# Patient Record
Sex: Male | Born: 1940
Health system: Southern US, Community
[De-identification: ages and names within clinical notes are randomized; demographics above are authoritative.]

## PROBLEM LIST (undated history)

## (undated) DIAGNOSIS — I2510 Atherosclerotic heart disease of native coronary artery without angina pectoris: Secondary | ICD-10-CM

## (undated) DIAGNOSIS — M8580 Other specified disorders of bone density and structure, unspecified site: Secondary | ICD-10-CM

## (undated) DIAGNOSIS — N183 Chronic kidney disease, stage 3 unspecified: Secondary | ICD-10-CM

## (undated) DIAGNOSIS — I10 Essential (primary) hypertension: Secondary | ICD-10-CM

## (undated) DIAGNOSIS — D649 Anemia, unspecified: Secondary | ICD-10-CM

## (undated) DIAGNOSIS — E785 Hyperlipidemia, unspecified: Secondary | ICD-10-CM

## (undated) DIAGNOSIS — N200 Calculus of kidney: Secondary | ICD-10-CM

## (undated) DIAGNOSIS — I493 Ventricular premature depolarization: Secondary | ICD-10-CM

## (undated) DIAGNOSIS — K219 Gastro-esophageal reflux disease without esophagitis: Secondary | ICD-10-CM

## (undated) DIAGNOSIS — H269 Unspecified cataract: Secondary | ICD-10-CM

## (undated) DIAGNOSIS — I219 Acute myocardial infarction, unspecified: Secondary | ICD-10-CM

## (undated) DIAGNOSIS — I491 Atrial premature depolarization: Secondary | ICD-10-CM

## (undated) DIAGNOSIS — E039 Hypothyroidism, unspecified: Secondary | ICD-10-CM

## (undated) DIAGNOSIS — W5911XA Bitten by nonvenomous snake, initial encounter: Secondary | ICD-10-CM

## (undated) HISTORY — DX: Atherosclerotic heart disease of native coronary artery without angina pectoris: I25.10

## (undated) HISTORY — PX: ORIF TIBIA & FIBULA FRACTURES: SHX2131

## (undated) HISTORY — DX: Unspecified cataract: H26.9

## (undated) HISTORY — DX: Atrial premature depolarization: I49.1

## (undated) HISTORY — PX: VASECTOMY: SHX75

## (undated) HISTORY — DX: Ventricular premature depolarization: I49.3

## (undated) HISTORY — DX: Hyperlipidemia, unspecified: E78.5

## (undated) HISTORY — DX: Essential (primary) hypertension: I10

## (undated) HISTORY — DX: Bitten by nonvenomous snake, initial encounter: W59.11XA

## (undated) HISTORY — DX: Gastro-esophageal reflux disease without esophagitis: K21.9

## (undated) HISTORY — DX: Acute myocardial infarction, unspecified: I21.9

## (undated) HISTORY — DX: Anemia, unspecified: D64.9

## (undated) HISTORY — PX: PTCA: SHX146

## (undated) HISTORY — DX: Chronic kidney disease, stage 3 unspecified: N18.30

## (undated) HISTORY — DX: Other specified disorders of bone density and structure, unspecified site: M85.80

## (undated) HISTORY — DX: Calculus of kidney: N20.0

## (undated) HISTORY — PX: INGUINAL HERNIA REPAIR: SHX194

---

## 1989-06-09 DIAGNOSIS — I2510 Atherosclerotic heart disease of native coronary artery without angina pectoris: Secondary | ICD-10-CM

## 1989-06-09 HISTORY — PX: ANGIOPLASTY: SHX39

## 1989-06-09 HISTORY — DX: Atherosclerotic heart disease of native coronary artery without angina pectoris: I25.10

## 2000-01-09 ENCOUNTER — Emergency Department (HOSPITAL_COMMUNITY): Admission: EM | Admit: 2000-01-09 | Discharge: 2000-01-10 | Payer: Self-pay

## 2000-06-06 ENCOUNTER — Emergency Department (HOSPITAL_COMMUNITY): Admission: EM | Admit: 2000-06-06 | Discharge: 2000-06-06 | Payer: Self-pay | Admitting: Emergency Medicine

## 2000-06-16 ENCOUNTER — Encounter: Payer: Self-pay | Admitting: Vascular Surgery

## 2000-06-16 ENCOUNTER — Ambulatory Visit (HOSPITAL_COMMUNITY): Admission: RE | Admit: 2000-06-16 | Discharge: 2000-06-16 | Payer: Self-pay | Admitting: Vascular Surgery

## 2002-05-29 ENCOUNTER — Emergency Department (HOSPITAL_COMMUNITY): Admission: EM | Admit: 2002-05-29 | Discharge: 2002-05-29 | Payer: Self-pay | Admitting: Emergency Medicine

## 2003-04-06 ENCOUNTER — Inpatient Hospital Stay (HOSPITAL_COMMUNITY): Admission: EM | Admit: 2003-04-06 | Discharge: 2003-04-08 | Payer: Self-pay | Admitting: Emergency Medicine

## 2003-06-10 HISTORY — PX: CARDIAC CATHETERIZATION: SHX172

## 2004-04-11 ENCOUNTER — Ambulatory Visit: Payer: Self-pay | Admitting: Internal Medicine

## 2004-04-23 ENCOUNTER — Ambulatory Visit: Payer: Self-pay | Admitting: Internal Medicine

## 2004-04-25 ENCOUNTER — Ambulatory Visit: Payer: Self-pay | Admitting: Internal Medicine

## 2004-05-08 ENCOUNTER — Ambulatory Visit: Payer: Self-pay | Admitting: Internal Medicine

## 2004-05-22 ENCOUNTER — Ambulatory Visit: Payer: Self-pay | Admitting: Cardiology

## 2004-05-22 ENCOUNTER — Ambulatory Visit: Payer: Self-pay

## 2004-05-24 ENCOUNTER — Ambulatory Visit: Payer: Self-pay | Admitting: Cardiology

## 2004-05-27 ENCOUNTER — Inpatient Hospital Stay (HOSPITAL_BASED_OUTPATIENT_CLINIC_OR_DEPARTMENT_OTHER): Admission: RE | Admit: 2004-05-27 | Discharge: 2004-05-27 | Payer: Self-pay | Admitting: Cardiology

## 2004-06-14 ENCOUNTER — Ambulatory Visit: Payer: Self-pay | Admitting: Cardiology

## 2004-06-17 ENCOUNTER — Ambulatory Visit: Payer: Self-pay | Admitting: Cardiology

## 2004-06-26 ENCOUNTER — Ambulatory Visit: Payer: Self-pay | Admitting: Internal Medicine

## 2004-07-03 ENCOUNTER — Ambulatory Visit: Payer: Self-pay | Admitting: Internal Medicine

## 2004-07-18 ENCOUNTER — Ambulatory Visit: Payer: Self-pay | Admitting: Cardiology

## 2004-09-02 ENCOUNTER — Ambulatory Visit: Payer: Self-pay | Admitting: Internal Medicine

## 2004-09-18 ENCOUNTER — Ambulatory Visit: Payer: Self-pay | Admitting: Internal Medicine

## 2005-01-02 ENCOUNTER — Ambulatory Visit: Payer: Self-pay | Admitting: Internal Medicine

## 2005-02-04 ENCOUNTER — Ambulatory Visit: Payer: Self-pay | Admitting: Internal Medicine

## 2005-04-16 ENCOUNTER — Ambulatory Visit: Payer: Self-pay | Admitting: Internal Medicine

## 2005-04-24 ENCOUNTER — Ambulatory Visit: Payer: Self-pay | Admitting: Internal Medicine

## 2005-05-21 ENCOUNTER — Ambulatory Visit: Payer: Self-pay | Admitting: Internal Medicine

## 2005-08-25 ENCOUNTER — Ambulatory Visit: Payer: Self-pay | Admitting: Internal Medicine

## 2005-11-18 ENCOUNTER — Ambulatory Visit: Payer: Self-pay | Admitting: Internal Medicine

## 2005-12-15 ENCOUNTER — Ambulatory Visit: Payer: Self-pay | Admitting: Internal Medicine

## 2005-12-22 ENCOUNTER — Ambulatory Visit: Payer: Self-pay | Admitting: Internal Medicine

## 2006-03-16 ENCOUNTER — Ambulatory Visit: Payer: Self-pay | Admitting: Internal Medicine

## 2006-04-02 ENCOUNTER — Ambulatory Visit: Payer: Self-pay | Admitting: Internal Medicine

## 2006-04-15 ENCOUNTER — Ambulatory Visit: Payer: Self-pay | Admitting: Internal Medicine

## 2006-05-04 ENCOUNTER — Ambulatory Visit: Payer: Self-pay | Admitting: Internal Medicine

## 2006-05-04 DIAGNOSIS — I2510 Atherosclerotic heart disease of native coronary artery without angina pectoris: Secondary | ICD-10-CM | POA: Insufficient documentation

## 2006-05-04 DIAGNOSIS — M899 Disorder of bone, unspecified: Secondary | ICD-10-CM | POA: Insufficient documentation

## 2006-05-04 DIAGNOSIS — J45909 Unspecified asthma, uncomplicated: Secondary | ICD-10-CM | POA: Insufficient documentation

## 2006-05-04 DIAGNOSIS — K219 Gastro-esophageal reflux disease without esophagitis: Secondary | ICD-10-CM | POA: Insufficient documentation

## 2006-05-04 DIAGNOSIS — R972 Elevated prostate specific antigen [PSA]: Secondary | ICD-10-CM | POA: Insufficient documentation

## 2006-05-04 DIAGNOSIS — E785 Hyperlipidemia, unspecified: Secondary | ICD-10-CM | POA: Insufficient documentation

## 2006-05-04 DIAGNOSIS — M949 Disorder of cartilage, unspecified: Secondary | ICD-10-CM

## 2006-05-04 DIAGNOSIS — I252 Old myocardial infarction: Secondary | ICD-10-CM | POA: Insufficient documentation

## 2006-05-04 DIAGNOSIS — Z87442 Personal history of urinary calculi: Secondary | ICD-10-CM | POA: Insufficient documentation

## 2006-06-08 ENCOUNTER — Ambulatory Visit: Payer: Self-pay | Admitting: Internal Medicine

## 2006-06-23 ENCOUNTER — Ambulatory Visit: Payer: Self-pay | Admitting: Internal Medicine

## 2006-08-11 ENCOUNTER — Ambulatory Visit: Payer: Self-pay | Admitting: Internal Medicine

## 2006-09-22 ENCOUNTER — Ambulatory Visit: Payer: Self-pay | Admitting: Internal Medicine

## 2006-09-22 LAB — CONVERTED CEMR LAB
ALT: 29 units/L (ref 0–40)
AST: 24 units/L (ref 0–37)
Albumin: 3.9 g/dL (ref 3.5–5.2)
Alkaline Phosphatase: 95 units/L (ref 39–117)
BUN: 12 mg/dL (ref 6–23)
Bilirubin, Direct: 0.1 mg/dL (ref 0.0–0.3)
CO2: 28 meq/L (ref 19–32)
Calcium: 9.7 mg/dL (ref 8.4–10.5)
Chloride: 105 meq/L (ref 96–112)
Cholesterol: 186 mg/dL (ref 0–200)
Creatinine, Ser: 0.9 mg/dL (ref 0.4–1.5)
GFR calc Af Amer: 109 mL/min
GFR calc non Af Amer: 90 mL/min
Glucose, Bld: 93 mg/dL (ref 70–99)
HDL: 41.8 mg/dL (ref >39.0)
LDL Cholesterol: 123 mg/dL — ABNORMAL HIGH (ref 0–99)
Potassium: 4.5 meq/L (ref 3.5–5.1)
Sodium: 141 meq/L (ref 135–145)
Total Bilirubin: 0.9 mg/dL (ref 0.3–1.2)
Total CHOL/HDL Ratio: 4.4
Total Protein: 7 g/dL (ref 6.0–8.3)
Triglycerides: 108 mg/dL (ref 0–149)
VLDL: 22 mg/dL (ref 0–40)

## 2006-12-07 ENCOUNTER — Ambulatory Visit: Payer: Self-pay | Admitting: Internal Medicine

## 2006-12-07 LAB — CONVERTED CEMR LAB
ALT: 40 units/L (ref 0–53)
AST: 29 units/L (ref 0–37)
Albumin: 3.8 g/dL (ref 3.5–5.2)
Alkaline Phosphatase: 104 units/L (ref 39–117)
Bilirubin, Direct: 0.2 mg/dL (ref 0.0–0.3)
Cholesterol: 172 mg/dL (ref 0–200)
HDL: 36 mg/dL — ABNORMAL LOW (ref >39.0)
LDL Cholesterol: 109 mg/dL — ABNORMAL HIGH (ref 0–99)
Total Bilirubin: 0.8 mg/dL (ref 0.3–1.2)
Total CHOL/HDL Ratio: 4.8
Total Protein: 6.9 g/dL (ref 6.0–8.3)
Triglycerides: 133 mg/dL (ref 0–149)
VLDL: 27 mg/dL (ref 0–40)

## 2007-02-05 ENCOUNTER — Encounter: Payer: Self-pay | Admitting: Internal Medicine

## 2007-04-08 ENCOUNTER — Ambulatory Visit: Payer: Self-pay | Admitting: Internal Medicine

## 2007-04-08 DIAGNOSIS — G4700 Insomnia, unspecified: Secondary | ICD-10-CM | POA: Insufficient documentation

## 2007-04-08 DIAGNOSIS — I10 Essential (primary) hypertension: Secondary | ICD-10-CM | POA: Insufficient documentation

## 2007-04-08 LAB — CONVERTED CEMR LAB
Cholesterol, target level: 200 mg/dL
HDL goal, serum: 40 mg/dL
LDL Goal: 100 mg/dL

## 2007-06-09 ENCOUNTER — Ambulatory Visit: Payer: Self-pay | Admitting: Family Medicine

## 2007-06-09 DIAGNOSIS — J45901 Unspecified asthma with (acute) exacerbation: Secondary | ICD-10-CM | POA: Insufficient documentation

## 2007-06-23 ENCOUNTER — Ambulatory Visit: Payer: Self-pay | Admitting: Internal Medicine

## 2007-06-24 ENCOUNTER — Ambulatory Visit: Payer: Self-pay | Admitting: Internal Medicine

## 2007-06-24 LAB — CONVERTED CEMR LAB
BUN: 21 mg/dL (ref 6–23)
Basophils Relative: 0.4 % (ref 0.0–1.0)
CO2: 26 meq/L (ref 19–32)
Calcium: 9.3 mg/dL (ref 8.4–10.5)
Chloride: 102 meq/L (ref 96–112)
Creatinine, Ser: 1 mg/dL (ref 0.4–1.5)
GFR calc Af Amer: 96 mL/min
Monocytes Relative: 7.9 % (ref 3.0–11.0)
Platelets: 260 10*3/uL (ref 150–400)
RBC: 3.9 M/uL — ABNORMAL LOW (ref 4.22–5.81)
RDW: 13 % (ref 11.5–14.6)
TSH: 2.94 microintl units/mL (ref 0.35–5.50)
WBC: 9.3 10*3/uL (ref 4.5–10.5)

## 2007-08-13 ENCOUNTER — Encounter: Payer: Self-pay | Admitting: Internal Medicine

## 2008-03-15 ENCOUNTER — Ambulatory Visit: Payer: Self-pay | Admitting: Internal Medicine

## 2008-06-09 HISTORY — PX: EYE SURGERY: SHX253

## 2008-07-04 ENCOUNTER — Ambulatory Visit: Payer: Self-pay | Admitting: Internal Medicine

## 2008-07-04 DIAGNOSIS — R5381 Other malaise: Secondary | ICD-10-CM | POA: Insufficient documentation

## 2008-07-04 DIAGNOSIS — R5383 Other fatigue: Secondary | ICD-10-CM

## 2008-07-05 ENCOUNTER — Ambulatory Visit: Payer: Self-pay | Admitting: Internal Medicine

## 2008-07-06 LAB — CONVERTED CEMR LAB
AST: 29 units/L (ref 0–37)
Albumin: 4 g/dL (ref 3.5–5.2)
Alkaline Phosphatase: 91 units/L (ref 39–117)
BUN: 13 mg/dL (ref 6–23)
Basophils Relative: 0.5 % (ref 0.0–3.0)
Eosinophils Relative: 1.7 % (ref 0.0–5.0)
GFR calc Af Amer: 96 mL/min
Glucose, Bld: 108 mg/dL — ABNORMAL HIGH (ref 70–99)
HCT: 40.6 % (ref 39.0–52.0)
Hemoglobin: 14.1 g/dL (ref 13.0–17.0)
Monocytes Absolute: 0.4 10*3/uL (ref 0.1–1.0)
Monocytes Relative: 6.8 % (ref 3.0–12.0)
Platelets: 240 10*3/uL (ref 150–400)
Potassium: 4.3 meq/L (ref 3.5–5.1)
RBC: 4.03 M/uL — ABNORMAL LOW (ref 4.22–5.81)
Total Protein: 7.1 g/dL (ref 6.0–8.3)
WBC: 6.5 10*3/uL (ref 4.5–10.5)

## 2008-07-18 ENCOUNTER — Ambulatory Visit: Payer: Self-pay | Admitting: Internal Medicine

## 2008-08-01 ENCOUNTER — Encounter: Payer: Self-pay | Admitting: Internal Medicine

## 2008-08-02 ENCOUNTER — Ambulatory Visit: Payer: Self-pay | Admitting: Internal Medicine

## 2008-08-02 DIAGNOSIS — R05 Cough: Secondary | ICD-10-CM

## 2008-08-02 DIAGNOSIS — R059 Cough, unspecified: Secondary | ICD-10-CM | POA: Insufficient documentation

## 2008-08-22 ENCOUNTER — Ambulatory Visit: Payer: Self-pay | Admitting: Internal Medicine

## 2008-09-25 ENCOUNTER — Emergency Department (HOSPITAL_COMMUNITY): Admission: EM | Admit: 2008-09-25 | Discharge: 2008-09-25 | Payer: Self-pay | Admitting: Emergency Medicine

## 2010-06-09 HISTORY — PX: CHOLECYSTECTOMY: SHX55

## 2010-10-25 NOTE — Procedures (Signed)
[place]. [facility]  Patient:    Richard Ho, Richard Ho                        MRN: [id number] Adm. Date:  [id number] Attending:  Alyson Locket CC:         Valetta Mole. Swords, M.D. [facility]   Procedure Report  PREOPERATIVE DIAGNOSIS:  Atheroemboli to left second toe.  POSTOPERATIVE DIAGNOSIS:  Atheroemboli to left second toe.  PROCEDURE:  Aortogram with bilateral lower extremity runoff.  SURGEON:  Larina Earthly, M.D.  ANESTHESIA:  1% lidocaine local.  COMPLICATIONS:  None.  DISPOSITION:  To holding area stable.  DESCRIPTION OF PROCEDURE:  The patient was taken to the peripheral vascular catheterization lab, placed in the supine position, where the area of both right and left groins was prepped and draped in the usual sterile fashion. Using local anesthesia and a single wall puncture, the right common femoral artery was entered, and a guidewire was passed up to the level of the suprarenal aorta.  A 5 French sheath was then passed over the guidewire.  A pigtail catheter was passed from the midthoracic aorta, and thoracic arteriogram was obtained.  The catheter was then withdrawn down to the level of the renal arteries, and abdominal aortic arteriogram was obtained.  Next, runoff was obtained through the same pigtail catheter in both iliac segments, femoral and tibial vessels.  Finally, the left common iliac artery was selectively catheterized by exchanging the pigtail catheter with an IMA catheter.  Arteriogram of the left foot was obtained.  The findings were of absolutely normal aorta, iliac, femoral and tibial sections with no evidence of any atheromatous irregularity.  The patient had widely patent three-vessel tibial runoff bilaterally.  The foot films showed normal flow into the foot. The patient tolerated the procedure without immediate complication, was transferred to the holding area, where the sheath was removed. DD:  06/16/00 TD:  06/16/00 Job:  [id number]

## 2010-10-25 NOTE — Op Note (Signed)
NAME:  AKI, ABALOS NO.:  [id number]   MEDICAL RECORD NO.:  [id number]                   PATIENT TYPE:  INP   LOCATION:  5039                                 FACILITY:  [facility]   PHYSICIAN:  Lenard Galloway. Chaney Malling, M.D.           DATE OF BIRTH:  May 03, 1941   DATE OF PROCEDURE:  04/06/2003  DATE OF DISCHARGE:                                 OPERATIVE REPORT   PREOPERATIVE DIAGNOSIS:  Fracture of right proximal fibula and fracture of  right tibia at junction of mid and distal thirds.   POSTOPERATIVE DIAGNOSIS:  Fracture of right proximal fibula and fracture of  right tibia at junction of mid and distal thirds.   OPERATION:  Intramedullary rod fixation, right tibia.   SURGEON:  Lenard Galloway. Chaney Malling, M.D.   ASSISTANT:  Madilyn Fireman, P.A.-C.   ANESTHESIA:  General.   PROCEDURE:  The patient was placed on the operating table in a supine  position with a pneumatic tourniquet about the right upper thigh.  The right  leg was prepped with Duraprep and draped out in the usual manner.  The leg  was then wrapped out with an Esmarch and tourniquet was elevated.  An  incision was made down the midline from the midportion of the patella down  to the tibial tubercle.  A median parapatellar incision was then made and  there was good access to the posterior aspect of the patellar tendon and the  fat pad.  An awl was placed just posterior to the patellar tendon in the  midline and a hole was made in the proximal tibia.  A series of guides were  passed down to open up the tibia.  A beaded rod was then passed down across  the fracture site with the fracture reduced.  A series of reamers were then  used and the tibia was reamed out to an 11 diameter.  The exchange tube was  then passed over the beaded guidewire and the beaded wire was removed and a  final guidewire was inserted.  The exchange tube was then removed.  The  properly measured tibial nail was then selected and  an outrigger was placed  on the proximal end.  The nail was placed over the guide pin to passed down  the fracture site, down to the distal part of the tibia, and excellent,  almost anatomic reduction was achieved.  There was a good snug fit.  This  was checked throughout the procedure with the C-arm.  Proximally, two screws  were placed using the external guide and these were placed at about 60  degrees of angulation to each other.  Tube cortical locking was achieved.  Distally, excellent position of the IM nail was seen.  The proximal  outrigger was then removed and a cap was placed on the proximal end of the  tibial intramedullary rod.  Attention was then turned to the  distal end of  the rod.  Using the C-arm, drill holes were placed through the distal tibia  through the rod and appropriate-length screws were selected and inserted.  Excellent locking of the rod was achieved distally.  Wounds were irrigated.  The subcutaneous tissue was closed with 2-0 Vicryl, stainless steel staples  were used to close the skin, sterile dressings were applied and the patient  returned to the recovery room in excellent condition.  Technically, this  procedure went extremely well.    DRAINS:  None.   COMPLICATIONS:  None.                                               Rodney A. Chaney Malling, M.D.    RAM/MEDQ  D:  04/06/2003  T:  04/07/2003  Job:  [id number]

## 2010-10-25 NOTE — Cardiovascular Report (Signed)
NAME:  Richard Ho, BONZO NO.:  [id number]   MEDICAL RECORD NO.:  [id number]          PATIENT TYPE:  OIB   LOCATION:  6501                         FACILITY:  [facility]   PHYSICIAN:  Rollene Rotunda, M.D.   DATE OF BIRTH:  1941/04/18   DATE OF PROCEDURE:  05/27/2004  DATE OF DISCHARGE:  05/27/2004                              CARDIAC CATHETERIZATION   PRIMARY CARE PHYSICIAN:  Dr. Birdie Sons   CARDIOLOGIST:  Dr. Antoine Poche   PROCEDURES PERFORMED:  1.  Left heart catheterization.  2.  Coronary arteriography.   INDICATIONS:  Evaluate patient with Cardiolite suggesting inferior ischemia.  He previously had known coronary disease.  He has had a couple of  interventions in [place].  Apparently, he had one in 72 in [place].  He  has had intervention as far as I can tell on mid and distal right coronary  lesions on separate occasions.  His last catheterization in 1997  demonstrated recannulized right coronary that was managed medically.   DETAILS OF THE PROCEDURE:  Left heart catheterization was performed via the  right femoral artery.  The artery was cannulated using anterior wall  puncture.  A #4-French arterial sheath was inserted via the modified  Seldinger technique.  Preformed Judkins and a pigtail catheter were  utilized.  Patient tolerated the procedure well and left the laboratory in  stable condition.   RESULTS:  HEMODYNAMICS:  LV 137/8.  AO 143/84.   CORONARIES:  The left main was normal.   The LAD had proximal long 40% stenosis bridging a small first diagonal and a  large second diagonal.  The distal vessel appeared to wrap the apex, but was  small in caliber and seemed to have some diffuse disease.  The second  diagonal was a large vessel with an ostial 50-60% stenosis seen in the RAO  cranial and RAO views.   Circumflex had mid 25% stenosis in the AV groove.  The distal AV groove  vessel was very small.  There was a large first obtuse marginal which  had  long proximal and mid 70% stenosis.  A second obtuse marginal came off right  below the first obtuse marginal and was a long, but narrow vessel with  proximal and mid 70% stenosis.   The right coronary artery was a large dominant vessel.  There was a long  proximal 50% stenosis and diffuse luminal irregularities in the proximal  segment.  There was mid 99% stenosis as was noted in 1997.  There was a long  40-30% stenosis before the PDA and 40% stenosis after the PDA before two  posterolaterals.  PDA was narrow caliber vessel, but long.  Had a mid  diffuse 90-95% stenosis.  There were two small posterolaterals after which  were free of high grade disease.   LEFT VENTRICULOGRAM:  A left ventriculogram was obtained in the RAO  projection.  The EF was 65% with well preserved wall motion.   CONCLUSION:  High grade right coronary artery disease.  Moderate to severe  circumflex disease and nonobstructive left anterior descending disease.  I  have reviewed  the previous notes.  There may be some progression in the left  anterior descending comparing this catheterization to their description.  Seems to be progression in the distal right coronary artery as well.  However, the mid right coronary artery lesion was present in 1997.  The  circumflex had some 70% narrowing by their description, though this may have  progressed as well.   PLAN:  I reviewed this very extensively with the patient and his wife.  At  this point we can consider three options.  Bypass would be an option, though  I do not think the disease has progressed dramatically and he is not having  any obvious unstable symptoms.  His Cardiolite suggested no high risk  features in the LAD circumflex distribution.  Consideration could be given  to angioplasty and stent of the worst lesion in this situation, this is the  right coronary.  However, this has not changed since 1997 by description.  At this point I favor very aggressive  medical management.  Toward that end,  he needs aggressive lifestyle changes with diet and exercise.  He needs high  dose of Statins with very low lipids as a goal.  I would put him on aspirin  and Plavix.  I will increase his beta blocker.  We would reconsider this  strategy with any symptoms.  I probably would follow him with stress  perfusion imaging understanding that the inferior wall will always be  ischemic.   I will review this with other colleagues to get further opinions.       JH/MEDQ  D:  05/27/2004  T:  05/28/2004  Job:  [id number]   cc:   Valetta Mole. Swords, M.D. [facility]

## 2010-10-25 NOTE — Discharge Summary (Signed)
NAME:  Richard Ho, Richard Ho NO.:  [id number]   MEDICAL RECORD NO.:  [id number]                   PATIENT TYPE:  INP   LOCATION:  5039                                 FACILITY:  [facility]   PHYSICIAN:  Lenard Galloway. Chaney Malling, M.D.           DATE OF BIRTH:  [birth year]   DATE OF ADMISSION:  04/06/2003  DATE OF DISCHARGE:  04/08/2003                                 DISCHARGE SUMMARY   ADMISSION DIAGNOSES:  1. Right open tibial-fibula fracture.  2. History of asthma.  3. History of myocardial infarction.  4. Hypertension.  5. Irritable bowel syndrome.  6. Hypercholesterolemia.  7. History of gastroesophageal reflux disease.  8. History of hiatal hernia.   DISCHARGE DIAGNOSES:  1. Intramedullary nailing of right tibia fracture.  2. History of asthma.  3. History of myocardial infarction.  4. Hypertension.  5. History of irritable bowel syndrome.  6. History of hypercholesterolemia.  7. History of gastroesophageal reflux disease.  8. History of hiatal hernia.   HISTORY OF PRESENT ILLNESS:  The patient is a 70 year old male who fell  earlier the date of admission at [employer] on a slick floor.  The  patient twisted his lower extremity, felt a pop in his leg.  The leg was  angulated upon EMS arrival.  The patient was brought to [facility] emergency  room for evaluation, found right tib-fib fracture which was open.   SURGICAL PROCEDURE:  On April 06, 2003 the patient was taken to the OR for  intramedullary rod fixation of his right tibia.  The surgical procedure went  well.  There were no complications and the patient was transferred to the  recovery room and then to the orthopedic floor in good condition.   CONSULTS:  The following routine consults were requested:  Physical therapy,  case management.   HOSPITAL COURSE:  On April 06, 2003 the patient was admitted to [facility] under the care of Dr. Rinaldo Ratel.  The patient was taken to  the OR where an intramedullary nailing of his right tibia fracture was  performed.  The patient tolerated the procedure well, there were no  complications, and the patient was transferred to the recovery room and then  to the orthopedic floor for routine postoperative care.   The patient then incurred a total of two days postoperative care on the  orthopedic floor in which the patient had no significant medical issues  incurred.  The patient's leg remained neuromotor-vascularly intact.  He was  placed in a __________ posterior splint for support.  He was placed on  Lovenox for routine DVT prophylaxis and he did receive physical therapy for  instructions with crutch training.   On postoperative day #2 the patient's pain was well controlled.  He had  worked well with physical therapy.  His wound was benign so he was  discharged home for recovery and home health physical therapy  in good  condition.   LABORATORY DATA:  EKG on admission was normal sinus rhythm at 79 beats per  minute.   Routine chemistries on admission:  Sodium 138, potassium 4.5, glucose 106,  BUN 11, creatinine 1.0.  Hematocrit 42 with a hemoglobin of 14.   MEDICATIONS UPON DISCHARGE FROM ORTHOPEDICS FLOOR:  1. Atenolol 25 mg p.o. daily.  2. Zocor 20 mg p.o. daily.  3. Protonix 40 mg p.o. daily.  4. Desyrel 25 mg p.o. p.r.n.  5. Librax one tablet p.o. p.r.n.  6. Lovenox 30 mg subcu q.12h.  7. Percocet one or two tablets q.4-6h. p.r.n. pain.   DISCHARGE INSTRUCTIONS:  1. Medications:  The patient is to resume routine home medications.  2. Percocet 5 mg one or two tablets q.4-6h. for pain if needed.  3. Lovenox 40 mg injection once a day for the next 11 days.  4. Activity:  The patient must rest as much as possible with the right foot     elevated.  The patient is only to have touchdown weight of right foot for     balance only with ambulating with use of crutches.  5. Diet:  No restrictions.  6. Wound care:  The  patient is to keep right foot cast clean and dry.   SPECIAL INSTRUCTIONS:  1. [facility] for physical therapy.  2. Follow-up:  The patient should call for a follow-up appointment with Dr.     Chaney Malling in one week.   CONDITION ON DISCHARGE:  The patient's condition upon discharge to home is  listed as improved and good.      Jamelle Rushing, P.A.                      Rodney A. Chaney Malling, M.D.    RWK/MEDQ  D:  05/03/2003  T:  05/03/2003  Job:  [id number]

## 2011-02-03 ENCOUNTER — Telehealth: Payer: Self-pay | Admitting: Internal Medicine

## 2011-02-03 NOTE — Telephone Encounter (Signed)
Pt called back today and is checking on the status of his Disability form. Thanks.

## 2011-02-06 ENCOUNTER — Telehealth: Payer: Self-pay | Admitting: *Deleted

## 2011-02-06 NOTE — Telephone Encounter (Signed)
Pt in limbo, needs to know if disability forms are going to be filled out.  Please advise any way.  Cindy Please notify pt.

## 2011-02-07 NOTE — Telephone Encounter (Signed)
Pt was informed that Dr Cato Mulligan would not fill out paperwork cause he has not been seen in 2 years.  Pt has an appt with Dr Cato Mulligan in October and he will bring paperwork with him on the day of appt.

## 2011-02-18 ENCOUNTER — Telehealth: Payer: Self-pay | Admitting: Internal Medicine

## 2011-02-18 NOTE — Telephone Encounter (Signed)
Walk in------had labs done at his Gi specialist and his Tsh results were 6.08. Pt was told by his specialist that it was high and that he would need to consult with Dr Cato Mulligan. Patient has an appt next month and would like to know if this could wait until then or does he need to be advised now of what to do? Arline Asp has the copy of his lab work. Thanks.

## 2011-02-18 NOTE — Telephone Encounter (Signed)
It can probably wait--i have not seen patient in years

## 2011-02-18 NOTE — Telephone Encounter (Signed)
Pt was told this when he was in the office.

## 2011-02-22 ENCOUNTER — Emergency Department (HOSPITAL_COMMUNITY): Payer: Medicare Other

## 2011-02-22 ENCOUNTER — Inpatient Hospital Stay (HOSPITAL_COMMUNITY)
Admission: EM | Admit: 2011-02-22 | Discharge: 2011-02-28 | DRG: 417 | Disposition: A | Discharge status: Home / Self Care | Payer: Medicare Other | Attending: Internal Medicine | Admitting: Internal Medicine

## 2011-02-22 DIAGNOSIS — R7401 Elevation of levels of liver transaminase levels: Secondary | ICD-10-CM

## 2011-02-22 DIAGNOSIS — K589 Irritable bowel syndrome without diarrhea: Secondary | ICD-10-CM | POA: Diagnosis present

## 2011-02-22 DIAGNOSIS — M1990 Unspecified osteoarthritis, unspecified site: Secondary | ICD-10-CM | POA: Diagnosis present

## 2011-02-22 DIAGNOSIS — Z79899 Other long term (current) drug therapy: Secondary | ICD-10-CM

## 2011-02-22 DIAGNOSIS — I252 Old myocardial infarction: Secondary | ICD-10-CM

## 2011-02-22 DIAGNOSIS — D72829 Elevated white blood cell count, unspecified: Secondary | ICD-10-CM | POA: Diagnosis present

## 2011-02-22 DIAGNOSIS — I10 Essential (primary) hypertension: Secondary | ICD-10-CM | POA: Diagnosis present

## 2011-02-22 DIAGNOSIS — K859 Acute pancreatitis without necrosis or infection, unspecified: Secondary | ICD-10-CM

## 2011-02-22 DIAGNOSIS — M899 Disorder of bone, unspecified: Secondary | ICD-10-CM | POA: Diagnosis present

## 2011-02-22 DIAGNOSIS — I2510 Atherosclerotic heart disease of native coronary artery without angina pectoris: Secondary | ICD-10-CM | POA: Diagnosis present

## 2011-02-22 DIAGNOSIS — K8020 Calculus of gallbladder without cholecystitis without obstruction: Secondary | ICD-10-CM

## 2011-02-22 DIAGNOSIS — K5900 Constipation, unspecified: Secondary | ICD-10-CM | POA: Diagnosis not present

## 2011-02-22 DIAGNOSIS — K8042 Calculus of bile duct with acute cholecystitis without obstruction: Secondary | ICD-10-CM | POA: Diagnosis present

## 2011-02-22 DIAGNOSIS — R1011 Right upper quadrant pain: Secondary | ICD-10-CM

## 2011-02-22 DIAGNOSIS — E119 Type 2 diabetes mellitus without complications: Secondary | ICD-10-CM | POA: Diagnosis present

## 2011-02-22 DIAGNOSIS — K5730 Diverticulosis of large intestine without perforation or abscess without bleeding: Secondary | ICD-10-CM | POA: Diagnosis present

## 2011-02-22 DIAGNOSIS — R740 Nonspecific elevation of levels of transaminase and lactic acid dehydrogenase [LDH]: Secondary | ICD-10-CM

## 2011-02-22 DIAGNOSIS — K219 Gastro-esophageal reflux disease without esophagitis: Secondary | ICD-10-CM | POA: Diagnosis present

## 2011-02-22 DIAGNOSIS — J45909 Unspecified asthma, uncomplicated: Secondary | ICD-10-CM | POA: Diagnosis present

## 2011-02-22 DIAGNOSIS — Z9861 Coronary angioplasty status: Secondary | ICD-10-CM

## 2011-02-22 DIAGNOSIS — E860 Dehydration: Secondary | ICD-10-CM | POA: Diagnosis present

## 2011-02-22 DIAGNOSIS — R109 Unspecified abdominal pain: Secondary | ICD-10-CM

## 2011-02-22 DIAGNOSIS — Z7982 Long term (current) use of aspirin: Secondary | ICD-10-CM

## 2011-02-22 DIAGNOSIS — Z9849 Cataract extraction status, unspecified eye: Secondary | ICD-10-CM

## 2011-02-22 DIAGNOSIS — J690 Pneumonitis due to inhalation of food and vomit: Secondary | ICD-10-CM | POA: Diagnosis present

## 2011-02-22 LAB — POCT I-STAT, CHEM 8
Calcium, Ion: 1.23 mmol/L (ref 1.12–1.32)
Glucose, Bld: 150 mg/dL — ABNORMAL HIGH (ref 70–99)
HCT: 41 % (ref 39.0–52.0)
Hemoglobin: 13.9 g/dL (ref 13.0–17.0)
Potassium: 3.9 mEq/L (ref 3.5–5.1)

## 2011-02-22 LAB — LACTIC ACID, PLASMA: Lactic Acid, Venous: 2 mmol/L (ref 0.5–2.2)

## 2011-02-22 LAB — TSH: TSH: 2.433 u[IU]/mL (ref 0.350–4.500)

## 2011-02-22 LAB — DIFFERENTIAL
Basophils Absolute: 0 10*3/uL (ref 0.0–0.1)
Basophils Relative: 0 % (ref 0–1)
Eosinophils Absolute: 0.1 10*3/uL (ref 0.0–0.7)
Neutrophils Relative %: 87 % — ABNORMAL HIGH (ref 43–77)

## 2011-02-22 LAB — COMPREHENSIVE METABOLIC PANEL
AST: 175 U/L — ABNORMAL HIGH (ref 0–37)
Albumin: 3.7 g/dL (ref 3.5–5.2)
Alkaline Phosphatase: 124 U/L — ABNORMAL HIGH (ref 39–117)
Chloride: 103 mEq/L (ref 96–112)
Potassium: 3.8 mEq/L (ref 3.5–5.1)
Total Bilirubin: 1.6 mg/dL — ABNORMAL HIGH (ref 0.3–1.2)
Total Protein: 6.9 g/dL (ref 6.0–8.3)

## 2011-02-22 LAB — CK TOTAL AND CKMB (NOT AT ARMC)
CK, MB: 2.1 ng/mL (ref 0.3–4.0)
Relative Index: INVALID (ref 0.0–2.5)
Total CK: 66 U/L (ref 7–232)

## 2011-02-22 LAB — CBC
Platelets: 277 10*3/uL (ref 150–400)
RBC: 4.06 MIL/uL — ABNORMAL LOW (ref 4.22–5.81)
WBC: 13.1 10*3/uL — ABNORMAL HIGH (ref 4.0–10.5)

## 2011-02-22 LAB — GLUCOSE, CAPILLARY
Glucose-Capillary: 139 mg/dL — ABNORMAL HIGH (ref 70–99)
Glucose-Capillary: 139 mg/dL — ABNORMAL HIGH (ref 70–99)

## 2011-02-22 LAB — HEPATIC FUNCTION PANEL
ALT: 240 U/L — ABNORMAL HIGH (ref 0–53)
AST: 226 U/L — ABNORMAL HIGH (ref 0–37)
Albumin: 3.8 g/dL (ref 3.5–5.2)
Bilirubin, Direct: 2 mg/dL — ABNORMAL HIGH (ref 0.0–0.3)

## 2011-02-22 LAB — TROPONIN I: Troponin I: <0.3 ng/mL (ref <0.30)

## 2011-02-22 LAB — URINALYSIS, ROUTINE W REFLEX MICROSCOPIC
Glucose, UA: NEGATIVE mg/dL
Ketones, ur: NEGATIVE mg/dL
Leukocytes, UA: NEGATIVE
Specific Gravity, Urine: 1.022 (ref 1.005–1.030)
pH: 6.5 (ref 5.0–8.0)

## 2011-02-22 MED ORDER — IOHEXOL 300 MG/ML  SOLN
100.0000 mL | Freq: Once | INTRAMUSCULAR | Status: AC | PRN
  Administered 2011-02-22: 100 mL via INTRAVENOUS

## 2011-02-23 DIAGNOSIS — Z01810 Encounter for preprocedural cardiovascular examination: Secondary | ICD-10-CM

## 2011-02-23 DIAGNOSIS — R7401 Elevation of levels of liver transaminase levels: Secondary | ICD-10-CM

## 2011-02-23 DIAGNOSIS — R740 Nonspecific elevation of levels of transaminase and lactic acid dehydrogenase [LDH]: Secondary | ICD-10-CM

## 2011-02-23 DIAGNOSIS — K8020 Calculus of gallbladder without cholecystitis without obstruction: Secondary | ICD-10-CM

## 2011-02-23 DIAGNOSIS — I2510 Atherosclerotic heart disease of native coronary artery without angina pectoris: Secondary | ICD-10-CM

## 2011-02-23 DIAGNOSIS — R1011 Right upper quadrant pain: Secondary | ICD-10-CM

## 2011-02-23 DIAGNOSIS — K859 Acute pancreatitis without necrosis or infection, unspecified: Secondary | ICD-10-CM

## 2011-02-23 LAB — GLUCOSE, CAPILLARY: Glucose-Capillary: 96 mg/dL (ref 70–99)

## 2011-02-23 LAB — COMPREHENSIVE METABOLIC PANEL
Albumin: 3.3 g/dL — ABNORMAL LOW (ref 3.5–5.2)
Alkaline Phosphatase: 140 U/L — ABNORMAL HIGH (ref 39–117)
BUN: 14 mg/dL (ref 6–23)
CO2: 26 mEq/L (ref 19–32)
Chloride: 104 mEq/L (ref 96–112)
Creatinine, Ser: 1.08 mg/dL (ref 0.50–1.35)
GFR calc non Af Amer: >60 mL/min (ref >60)
Potassium: 3.9 mEq/L (ref 3.5–5.1)
Total Bilirubin: 4.5 mg/dL — ABNORMAL HIGH (ref 0.3–1.2)

## 2011-02-23 LAB — URINE CULTURE
Colony Count: NO GROWTH
Culture  Setup Time: 201209151729
Culture: NO GROWTH

## 2011-02-23 LAB — DIFFERENTIAL
Basophils Absolute: 0 10*3/uL (ref 0.0–0.1)
Lymphocytes Relative: 8 % — ABNORMAL LOW (ref 12–46)
Lymphs Abs: 0.8 10*3/uL (ref 0.7–4.0)
Neutro Abs: 9.1 10*3/uL — ABNORMAL HIGH (ref 1.7–7.7)
Neutrophils Relative %: 84 % — ABNORMAL HIGH (ref 43–77)

## 2011-02-23 LAB — CBC
HCT: 35.3 % — ABNORMAL LOW (ref 39.0–52.0)
Hemoglobin: 12.1 g/dL — ABNORMAL LOW (ref 13.0–17.0)
MCV: 95.1 fL (ref 78.0–100.0)
RBC: 3.71 MIL/uL — ABNORMAL LOW (ref 4.22–5.81)
RDW: 13.9 % (ref 11.5–15.5)
WBC: 10.9 10*3/uL — ABNORMAL HIGH (ref 4.0–10.5)

## 2011-02-23 LAB — AMYLASE: Amylase: 74 U/L (ref 0–105)

## 2011-02-23 LAB — LIPASE, BLOOD: Lipase: 40 U/L (ref 11–59)

## 2011-02-24 LAB — BASIC METABOLIC PANEL
CO2: 22 mEq/L (ref 19–32)
Chloride: 104 mEq/L (ref 96–112)
GFR calc Af Amer: >60 mL/min (ref >60)
Potassium: 3.5 mEq/L (ref 3.5–5.1)

## 2011-02-24 LAB — URINALYSIS, MICROSCOPIC ONLY
Glucose, UA: NEGATIVE mg/dL
Specific Gravity, Urine: 1.024 (ref 1.005–1.030)
Urobilinogen, UA: 1 mg/dL (ref 0.0–1.0)
pH: 6 (ref 5.0–8.0)

## 2011-02-24 LAB — HEPATIC FUNCTION PANEL
Albumin: 3.1 g/dL — ABNORMAL LOW (ref 3.5–5.2)
Alkaline Phosphatase: 166 U/L — ABNORMAL HIGH (ref 39–117)
Indirect Bilirubin: 1.1 mg/dL — ABNORMAL HIGH (ref 0.3–0.9)
Total Protein: 6.6 g/dL (ref 6.0–8.3)

## 2011-02-24 LAB — LIPID PANEL
Cholesterol: 149 mg/dL (ref 0–200)
Triglycerides: 190 mg/dL — ABNORMAL HIGH (ref <150)

## 2011-02-24 LAB — CBC
HCT: 35.1 % — ABNORMAL LOW (ref 39.0–52.0)
MCV: 95.6 fL (ref 78.0–100.0)
Platelets: 235 10*3/uL (ref 150–400)
RBC: 3.67 MIL/uL — ABNORMAL LOW (ref 4.22–5.81)
RDW: 14.2 % (ref 11.5–15.5)
WBC: 7.9 10*3/uL (ref 4.0–10.5)

## 2011-02-24 LAB — GLUCOSE, CAPILLARY
Glucose-Capillary: 108 mg/dL — ABNORMAL HIGH (ref 70–99)
Glucose-Capillary: 114 mg/dL — ABNORMAL HIGH (ref 70–99)

## 2011-02-24 LAB — AMYLASE: Amylase: 61 U/L (ref 0–105)

## 2011-02-24 LAB — LIPASE, BLOOD: Lipase: 27 U/L (ref 11–59)

## 2011-02-25 ENCOUNTER — Other Ambulatory Visit (INDEPENDENT_AMBULATORY_CARE_PROVIDER_SITE_OTHER): Payer: Self-pay | Admitting: General Surgery

## 2011-02-25 ENCOUNTER — Inpatient Hospital Stay (HOSPITAL_COMMUNITY): Payer: Medicare Other

## 2011-02-25 DIAGNOSIS — K8010 Calculus of gallbladder with chronic cholecystitis without obstruction: Secondary | ICD-10-CM

## 2011-02-25 LAB — HEPATIC FUNCTION PANEL
Bilirubin, Direct: 1 mg/dL — ABNORMAL HIGH (ref 0.0–0.3)
Indirect Bilirubin: 1 mg/dL — ABNORMAL HIGH (ref 0.3–0.9)

## 2011-02-25 LAB — CBC
MCH: 33 pg (ref 26.0–34.0)
MCHC: 34.4 g/dL (ref 30.0–36.0)
Platelets: 235 10*3/uL (ref 150–400)

## 2011-02-25 LAB — DIFFERENTIAL
Basophils Relative: 0 % (ref 0–1)
Eosinophils Absolute: 0.2 10*3/uL (ref 0.0–0.7)
Monocytes Absolute: 0.8 10*3/uL (ref 0.1–1.0)
Monocytes Relative: 14 % — ABNORMAL HIGH (ref 3–12)
Neutrophils Relative %: 63 % (ref 43–77)

## 2011-02-25 LAB — ABO/RH: ABO/RH(D): A POS

## 2011-02-25 LAB — GLUCOSE, CAPILLARY
Glucose-Capillary: 98 mg/dL (ref 70–99)
Glucose-Capillary: 106 mg/dL — ABNORMAL HIGH (ref 70–99)
Glucose-Capillary: 125 mg/dL — ABNORMAL HIGH (ref 70–99)
Glucose-Capillary: 94 mg/dL (ref 70–99)

## 2011-02-25 LAB — BASIC METABOLIC PANEL
Chloride: 106 mEq/L (ref 96–112)
GFR calc Af Amer: >60 mL/min (ref >60)
Potassium: 3.6 mEq/L (ref 3.5–5.1)
Sodium: 139 mEq/L (ref 135–145)

## 2011-02-25 LAB — URINE CULTURE
Colony Count: NO GROWTH
Culture: NO GROWTH

## 2011-02-26 LAB — BASIC METABOLIC PANEL
CO2: 27 mEq/L (ref 19–32)
Calcium: 9.3 mg/dL (ref 8.4–10.5)
Chloride: 102 mEq/L (ref 96–112)
Glucose, Bld: 103 mg/dL — ABNORMAL HIGH (ref 70–99)
Potassium: 3.7 mEq/L (ref 3.5–5.1)
Sodium: 138 mEq/L (ref 135–145)

## 2011-02-26 LAB — GLUCOSE, CAPILLARY
Glucose-Capillary: 106 mg/dL — ABNORMAL HIGH (ref 70–99)
Glucose-Capillary: 126 mg/dL — ABNORMAL HIGH (ref 70–99)

## 2011-02-26 LAB — DIFFERENTIAL
Basophils Absolute: 0 10*3/uL (ref 0.0–0.1)
Lymphocytes Relative: 16 % (ref 12–46)
Monocytes Absolute: 1 10*3/uL (ref 0.1–1.0)
Monocytes Relative: 13 % — ABNORMAL HIGH (ref 3–12)
Neutro Abs: 5.4 10*3/uL (ref 1.7–7.7)

## 2011-02-26 LAB — HEPATIC FUNCTION PANEL
Albumin: 3 g/dL — ABNORMAL LOW (ref 3.5–5.2)
Alkaline Phosphatase: 163 U/L — ABNORMAL HIGH (ref 39–117)
Total Protein: 6.6 g/dL (ref 6.0–8.3)

## 2011-02-26 LAB — CBC
HCT: 34 % — ABNORMAL LOW (ref 39.0–52.0)
Hemoglobin: 11.5 g/dL — ABNORMAL LOW (ref 13.0–17.0)
MCHC: 33.8 g/dL (ref 30.0–36.0)

## 2011-02-27 LAB — GLUCOSE, CAPILLARY
Glucose-Capillary: 107 mg/dL — ABNORMAL HIGH (ref 70–99)
Glucose-Capillary: 120 mg/dL — ABNORMAL HIGH (ref 70–99)

## 2011-02-28 LAB — CROSSMATCH
ABO/RH(D): A POS
Antibody Screen: NEGATIVE
Unit division: 0

## 2011-02-28 LAB — GLUCOSE, CAPILLARY: Glucose-Capillary: 100 mg/dL — ABNORMAL HIGH (ref 70–99)

## 2011-02-28 NOTE — Consult Note (Signed)
  NAMEJERRETT, BALDINGER NO.:  [id number]  MEDICAL RECORD NO.:  [id number]  LOCATION:  4737                         FACILITY:  [facility]  PHYSICIAN:  Ollen Gross. Vernell Morgans, M.D. DATE OF BIRTH:  1940/12/02  DATE OF CONSULTATION:  02/22/2011 DATE OF DISCHARGE:                                CONSULTATION   We were asked to see Mr. Marchitto in consultation by Dr. Janee Morn to evaluate him for gallstones.  CHIEF COMPLAINT:  Abdominal pain.  Mr. Goerner is a 70 year old white male who presents acutely to the emergency department with epigastric pain that started 3 a.m. this morning.  Pain was not associated with nausea or vomiting, but he did have shortness of breath with it.  It was pretty severe, so he came to the emergency department.  Since coming to the emergency department, got some medicine, he has had some relief.  PAST MEDICAL HISTORY:  Significant for coronary artery disease, MI, diabetes, hypertension, gastroesophageal reflux, asthma.  PAST SURGICAL HISTORY:  Bilateral inguinal hernia repairs, rhinoplasty, and right cataract surgery.  MEDICATIONS:  Include atenolol, metformin, simvastatin.  ALLERGIES:  To MELOXICAM.  SOCIAL HISTORY:  Denies use of alcohol or tobacco products.  FAMILY HISTORY:  Noncontributory.  PHYSICAL EXAMINATION:  VITAL SIGNS:  Temperature is 101.5, pulse 107, blood pressure 147/68. GENERAL:  He is a well-developed, well-nourished white male, in no acute distress. SKIN:  Warm and dry.  No jaundice. HEENT:  Eyes, extraocular movements are intact.  Pupils equal round and reactive to light.  Sclerae nonicteric. LUNGS:  Clear bilaterally with no use of accessory respiratory muscles. HEART:  Regular rate and rhythm with impulse in the left chest. ABDOMEN:  Soft with some mild epigastric tenderness, but no guarding or peritonitis.  No palpable mass or hepatosplenomegaly. EXTREMITIES:  No cyanosis, clubbing, or edema.  He has good strength in his  arms and legs. PSYCHOLOGIC:  He is alert and oriented x3 with no evidence of anxiety or depression.  Upon review of his lab work, significant for white count of 13,000, lipase of 83.  His ultrasound and CT showed some small gallstones, some gallbladder wall thickening, some inflammation around the head of the pancreas.  ASSESSMENT AND PLAN:  This is a 70 year old gentleman with what sounds like gallstone pancreatitis.  I would recommend bowel rest, rechecking his labs tomorrow.  If his amylase, lipase, and liver functions abnormalities resolves, then I think he will need laparoscopic cholecystectomy during this admission.  He also needs cardiac clearance prior to undergoing general anesthesia given his cardiac history.  We will follow along with you.     Ollen Gross. Vernell Morgans, M.D.     PST/MEDQ  D:  02/22/2011  T:  02/22/2011  Job:  [id number]  Electronically Signed by Chevis Pretty III M.D. on 02/28/2011 02:09:13 PM

## 2011-02-28 NOTE — Op Note (Signed)
NAMEKEIR, Ho NO.:  [id number]  MEDICAL RECORD NO.:  [id number]  LOCATION:  4737                         FACILITY:  [facility]  PHYSICIAN:  Ollen Gross. Vernell Morgans, M.D. DATE OF BIRTH:  [birth year]  DATE OF PROCEDURE:  02/25/2011 DATE OF DISCHARGE:                              OPERATIVE REPORT   PREOPERATIVE DIAGNOSIS:  Gallstone pancreatitis.  POSTOPERATIVE DIAGNOSIS:  Gallstone pancreatitis.  PROCEDURE:  Laparoscopic cholecystectomy with intraoperative cholangiogram.  SURGEON:  Ollen Gross. Vernell Morgans, MD  ASSISTANT:  Adolph Pollack, MD  ANESTHESIA:  General endotracheal.  PROCEDURE:  After informed consent was obtained, the patient was brought to the operating room, placed in supine position on the operating room table.  After adequate induction of general anesthesia, the patient's abdomen was prepped with ChloraPrep, allowed to dry, and draped in usual sterile manner.  The area above the umbilicus was infiltrated with 0.25% Marcaine.  A small incision was made with a 15-blade knife.  This incision was carried down through the subcutaneous tissue bluntly with hemostat and [employer] retractors until linea alba was identified.  The linea alba was incised with a 15-blade knife and each side was grasped with Kocher clamps and elevated anteriorly.  The preperitoneal space was then probed bluntly hemostat until the peritoneum was opened and access was gained to the abdominal cavity.  A 0-Vicryl pursestring stitch was placed in the fascia around the opening.  Hasson cannula was placed through the opening and placed in the previously placed Vicryl pursestring stitch.  The abdomen was then insufflated with carbon dioxide without difficulty.  The patient was placed in reverse Trendelenburg position, rotated with the right side up.  Laparoscope was inserted through the Hasson cannula.  Right upper quadrant was inspected.  The dome of the gallbladder and liver readily  identified. Next, the epigastric region was infiltrated with 0.25% Marcaine.  A small incision was made with 15-blade knife.  A 10-mm port was placed bluntly through this incision into the abdominal cavity under direct vision.  Sites were then chosen laterally and right side of the abdomen with placement of two 5-mm ports.  Each of these areas were infiltrated with 0.25% Marcaine.  Small stab incisions were made with a 15 blade knife and 5-mm ports were placed bluntly through these incisions into the abdominal cavity under direct vision.  A blunt grasper was placed through the lateral-most 5-mm port and used to grasp the dome of gallbladder and elevated anteriorly and superiorly.  Another blunt grasper was placed through the 5-mm port and used to retract on the body and neck of the gallbladder.  There were some omental adhesions to divide the gallbladder, it was taken down sharply with the cautery.  At this point, a dissector was placed through the epigastric port using the electrocautery peritoneal reflection the gallbladder neck was opened. Blunt dissection was then carried out in this area until the gallbladder neck cystic duct junction was readily identified and a good window was created.  A single clip was placed on the gallbladder neck.  A small ductotomy was made just below the clip adjusted with laparoscopic scissors.  A 14-gauge Angiocath was placed percutaneously to  the anterior abdominal wall and under direct vision, Reddick cholangiogram catheter was placed through the Angiocath and flushed.  The Reddick catheter was then placed in the cystic duct and anchored in place with the clip.  A cholangiogram was obtained that showed no filling defects, good emptying in duodenum, and adequate length on the cystic duct.  The anchoring clip and catheters were removed from the patient.  Three clips were placed proximally in the cystic duct and the duct was divided between 2 sets of clips.  Posterior to this, the cystic arteries were identified and again dissected bluntly in circumferential manner until a good window was created.  Two clips were placed proximally and distally on the artery and the artery were divided between the two.  Next, a laparoscopic hook cautery device was used to separate the gallbladder from liver bed.  Prior to completely detaching the gallbladder from liver bed, the liver bed was inspected and several small bleeding points were coagulated with electrocautery until the area was completely hemostatic.  The gallbladder was then detached rest of the way from liver bed without difficulty.  A laparoscopic bag was inserted through the epigastric port.  The gallbladder was placed in the bag and bag was sealed.  The abdomen was then irrigated with copious amounts of saline until the effluent was clear.  The laparoscope was then moved to the epigastric port.  A gallbladder grasper was placed through the Hasson cannula and used to grasp open the bag.  The bag with the gallbladder was removed through the supraumbilical port without difficulty.  The fascial defect was closed with the previously placed Vicryl pursestring stitch as well as with another figure-of-eight 0-Vicryl stitch.  The rest of the ports were removed under direct vision and were found to be hemostatic.  The gas was allowed to escape.  The skin incisions were all closed with interrupted 4-0 Monocryl subcuticular stitches and Dermabond dressings were applied.  The patient tolerated the procedure well and at the end of the case, all needle, sponge, and instrument counts were correct.  The patient was then awakened and taken to recovery room in a stable condition.     Ollen Gross. Vernell Morgans, M.D.     PST/MEDQ  D:  02/25/2011  T:  02/25/2011  Job:  [id number]  Electronically Signed by Chevis Pretty III M.D. on 02/28/2011 02:09:19 PM

## 2011-03-06 ENCOUNTER — Other Ambulatory Visit (INDEPENDENT_AMBULATORY_CARE_PROVIDER_SITE_OTHER): Payer: Medicare Other

## 2011-03-06 ENCOUNTER — Other Ambulatory Visit: Payer: Self-pay

## 2011-03-06 DIAGNOSIS — E119 Type 2 diabetes mellitus without complications: Secondary | ICD-10-CM

## 2011-03-06 DIAGNOSIS — I10 Essential (primary) hypertension: Secondary | ICD-10-CM

## 2011-03-06 DIAGNOSIS — Z79899 Other long term (current) drug therapy: Secondary | ICD-10-CM

## 2011-03-06 DIAGNOSIS — Z125 Encounter for screening for malignant neoplasm of prostate: Secondary | ICD-10-CM

## 2011-03-06 DIAGNOSIS — Z0000 Encounter for general adult medical examination without abnormal findings: Secondary | ICD-10-CM

## 2011-03-06 LAB — POCT URINALYSIS DIPSTICK
Bilirubin, UA: NEGATIVE
Blood, UA: NEGATIVE
Glucose, UA: NEGATIVE
Ketones, UA: NEGATIVE
Nitrite, UA: NEGATIVE
Spec Grav, UA: 1.02
pH, UA: 6

## 2011-03-06 LAB — CBC WITH DIFFERENTIAL/PLATELET
Basophils Absolute: 0.1 10*3/uL (ref 0.0–0.1)
Eosinophils Absolute: 0.2 10*3/uL (ref 0.0–0.7)
HCT: 35.9 % — ABNORMAL LOW (ref 39.0–52.0)
Lymphocytes Relative: 22.5 % (ref 12.0–46.0)
Lymphs Abs: 1.8 10*3/uL (ref 0.7–4.0)
MCHC: 32.7 g/dL (ref 30.0–36.0)
Monocytes Relative: 7.4 % (ref 3.0–12.0)
Neutro Abs: 5.5 10*3/uL (ref 1.4–7.7)
Platelets: 354 10*3/uL (ref 150.0–400.0)
RDW: 14.4 % (ref 11.5–14.6)

## 2011-03-07 LAB — TSH: TSH: 6 u[IU]/mL — ABNORMAL HIGH (ref 0.35–5.50)

## 2011-03-07 LAB — LIPID PANEL
LDL Cholesterol: 100 mg/dL — ABNORMAL HIGH (ref 0–99)
Total CHOL/HDL Ratio: 4
Triglycerides: 99 mg/dL (ref 0.0–149.0)
VLDL: 19.8 mg/dL (ref 0.0–40.0)

## 2011-03-07 LAB — BASIC METABOLIC PANEL
BUN: 15 mg/dL (ref 6–23)
CO2: 27 mEq/L (ref 19–32)
Calcium: 9.3 mg/dL (ref 8.4–10.5)
GFR: 68.87 mL/min (ref >60.00)
Glucose, Bld: 95 mg/dL (ref 70–99)

## 2011-03-07 LAB — HEPATIC FUNCTION PANEL
AST: 23 U/L (ref 0–37)
Albumin: 3.6 g/dL (ref 3.5–5.2)
Total Bilirubin: 0.9 mg/dL (ref 0.3–1.2)

## 2011-03-07 LAB — MICROALBUMIN / CREATININE URINE RATIO: Microalb Creat Ratio: 0.9 mg/g (ref 0.0–30.0)

## 2011-03-12 ENCOUNTER — Encounter: Payer: Self-pay | Admitting: Internal Medicine

## 2011-03-12 NOTE — Consult Note (Signed)
Richard Ho, Richard Ho NO.:  [id number]  MEDICAL RECORD NO.:  [id number]  LOCATION:  4737                         FACILITY:  [facility]  PHYSICIAN:  Gerrit Friends. Dietrich Pates, MD, FACCDATE OF BIRTH:  Feb 23, 1941  DATE OF CONSULTATION:  02/23/2011 DATE OF DISCHARGE:                                CONSULTATION   REFERRING PHYSICIAN:  Ramiro Harvest, MD  PRIMARY CARE PHYSICIAN:  Valetta Mole. Swords, MD and the [employer].  CARDIOLOGIST:  Dr. Rollene Rotunda - has not been seen for years.  HISTORY OF PRESENT ILLNESS:  Mr. Mikami is a very jovial gentleman with history of coronary artery disease.  The details of his prior treatment are uncertain, but he reportedly previously underwent percutaneous intervention and had a history of myocardial infarction.  Cardiac catheterization in 1997, reportedly demonstrated critical stenosis of the right coronary artery with good collaterals prompting a decision for continued medical therapy.  He underwent stress testing in 2005, revealing evidence for inferior ischemia and prompting repeat coronary angiography.  He had moderate disease in the circumflex with a 70% stenosis of the first marginal, mild-to-moderate disease in the LAD, with a 40% mid vessel lesion, and 60% stenosis of a second diagonal, and severe disease in a large dominant right coronary with a 50% proximal stenosis, 99% mid stenosis, and 90% narrowing in the mid PDA.  LV systolic function was normal.  These findings were thought to be little changed since 1997.  In recent years, Mr. Leisinger has done well from a symptomatic standpoint. He has experienced no chest discomfort and no dyspnea.  He was active until a year or two  ago when he had adopted a more sedentary lifestyle. He experienced financial reverses some years ago, which prompted him to seek care at the [place] and to not return to Cardiology.  He currently ascends one flight of stairs in his home  multiple times per day without difficulty.  He is now admitted with pancreatitis and cholelithiasis with plans for cholecystectomy.  PAST MEDICAL HISTORY:  Otherwise notable: 1. Hypertension. 2. Hyperlipidemia. 3. IBS. 4. Gastroesophageal reflux disease. 5. Hiatal hernia. 6. Asthma. 7. Open tibia/fibular fracture requiring ORIF in 2004.  PAST SURGICAL HISTORY:  He has had nephrolithiasis, undergone an uncomplicated bilateral inguinal herniorrhaphy, undergone vasectomy, and has a history of DJD, particularly involving the left hip.  He was recently diagnosed with mild diabetes.  Diverticulosis was seen on colonoscopy.  He has undergone right eye cataract extraction.  There was a vague history of osteopenia.  MEDICATIONS:  Are as listed in the medical reconciliation.  ALLERGIES:  He reports an allergy to MELOXICAM.  SOCIAL HISTORY:  No use of tobacco products nor alcohol; married; retired.  FAMILY HISTORY:  Mother died at age 44 of uncertain cause.  Father died at age 27 with a history of renal and coronary artery disease.REVIEW OF SYSTEMS:  Positive for anxiety.  The patient has intermittent epigastric discomfort compatible with GERD.  He has intermittent abdominal cramping for which he has been diagnosed as having IBS.  All other systems reviewed and are negative.  PHYSICAL EXAMINATION:  GENERAL:  Pleasant, overweight gentleman in no acute distress. VITAL  SIGNS:  Weight is 99.3 kg, blood pressure 150/75, temperature 98.3, heart rate 85 and regular, respirations 16, O2 saturation 95% on room air. HEENT:  Anicteric sclerae; normal lids and conjunctivae; normal EOMs; normal oral mucosa. NECK:  No jugular venous distention; normal carotid upstrokes without bruits. ENDOCRINE:  No thyromegaly. HEMATOPOIETIC:  No adenopathy. SKIN:  No significant lesions. LUNGS:  Clear. CARDIAC:  Normal first and second heart sounds; fourth heart sound present. ABDOMEN:  Soft and nontender;  normal bowel sounds; no organomegaly. EXTREMITIES:  No edema; normal distal pulses.  INITIAL LABORATORY:  Notable for a normal CBC except for white count of 13,100 with increased neutrophils, mild hyperglycemia with blood glucose ranging from 121-146, and negative cardiac markers.  LFTs are abnormal, but amylase is only 74, but lipase is somewhat elevated at 83. Hemoglobin A1c is 6.5.  IMPRESSION:  Mr. Golphin has known moderately severe coronary disease last assessed 7 years ago.  In the absence of symptoms, stress testing or other investigations to assess the extent of coronary disease are not likely to be of benefit.  The patient is already receiving beta blockers, but at low intravenous doses.  We will increase these to make them more analogous to the oral medication he was taking prior to admission.  Laparoscopic cholecystectomy, which likely will be possible in Mr. Hurlbut' case, is relatively low risk operation which should not provide much more physiologic stress than Mr. Mccollam faces in his daily activities.  [facility] will be available to assist with the cardiac needs of this nice gentleman in the perioperative interval as necessary.     Gerrit Friends. Dietrich Pates, MD, [facility]     RMR/MEDQ  D:  02/23/2011  T:  02/23/2011  Job:  [id number]  Electronically Signed by Hope Bing MD [facility] on 03/12/2011 10:31:58 AM

## 2011-03-13 ENCOUNTER — Encounter: Payer: Self-pay | Admitting: Internal Medicine

## 2011-03-13 NOTE — H&P (Signed)
NAME:  Richard Ho NO.:  [id number]  MEDICAL RECORD NO.:  [id number]  LOCATION:  [facility]                         FACILITY:  [facility]  PHYSICIAN:  Ramiro Harvest, MD    DATE OF BIRTH:  10-20-1940  DATE OF ADMISSION:  02/22/2011 DATE OF DISCHARGE:                             HISTORY & PHYSICAL   PRIMARY CARE PHYSICIAN:  Valetta Mole. Swords, MD of [facility]  GASTROENTEROLOGIST:  Anselmo Rod, MD, [facility]  CARDIOLOGIST:  Rollene Rotunda, MD, [facility]  CHIEF COMPLAINT:  Upper abdominal pain/epigastric pain.  HISTORY OF PRESENT ILLNESS:  Richard Ho is a pleasant 70 year old Caucasian gentleman with history of hypertension, type II diabetes, coronary artery disease status post angioplasty/PTCA, prior history of MI, history of hyperlipidemia, history of IBS, history of gastroesophageal reflux disease, and history of diverticulosis per recent colonoscopy per the patient presented to the ED with worsening intense epigastric/upper abdominal pain that occurred at rest when he awoke this morning at around 03:30 a.m.  The patient states that he awoke around 03:30 a.m. with severe epigastric abdominal pain that radiated down to his lower abdomen with abdominal bloating and feeling like his stomach was going to blow up.  The patient described the pain as a tight and intense pain, had some nausea, some diaphoresis, and some generalized weakness.  The patient denies any fever.  No chills, no chest pain, no dysuria, no diarrhea, no constipation, and no focal neurological symptoms.  The patient did endorse some worsening abdominal pain with deep inspiration with some shortness of breath.  The patient called his GI doctors' office and MD on call who was covering asked him to try some hyoscyamine and if no improvement, to call him back.  The patient tried his hyoscyamine with no improvement and called GI back and was instructed to present to the ED.   Per the patient, he had a recent EGD and colonoscopy approximately 6 weeks ago that was only positive for diverticulosis.  The patient was seen in the ED.  LABORATORY DATA:  Lab work which was obtained did show a elevated lipase level of 83.  Urinalysis was unremarkable.  CMET did have increased bilirubin and LFTs.  Lactic acid level was normal.  CBC had a white count of 13.1 with a left shift with an ANC of 11.5, otherwise was within normal limits.  Troponin I was negative.  IMAGING DATA:  A chest x-ray which was obtained had some concern for possible early pneumonia.  CT of the abdomen and pelvis did have mild inflammatory changes in the right upper quadrant.  The pancreatic head, duodenum, and gallbladder abdominal ultrasound was performed that showed tiny gallstones, gallbladder wall thickening.  No hydronephrosis.  Did show a hepatic cyst and a small bilateral renal cysts who called to admit the patient for further evaluation and management.  ALLERGIES:  MELOXICAM.  PAST MEDICAL HISTORY: 1. History of hypertension. 2. Hyperlipidemia. 3. Irritable bowel syndrome. 4. Gastroesophageal reflux disease. 5. History of hiatal hernia. 6. History of asthma. 7. History of right open tib-fib fracture status post intramedullary     rod fixation of the right tibia on April 06, 2003 per  Dr.     Chaney Malling.  Also history of atheroemboli to the left second toe     status post aortogram with bilateral lower extremity runoff per Dr.     Arbie Cookey on June 16, 2000. 8. Coronary artery disease status post cardiac catheterization in     December 2005, which showed high-grade right coronary artery     disease, moderate to severe circumflex disease, and nonobstructive     LAD disease. 9. History of nephrolithiasis. 10.History of osteopenia status post bilateral inguinal herniorrhaphy. 11.Status post PTCA. 12.Status post vasectomy. 13.Status post angioplasty in [place] in 1991. 14.History of  left hip inflamed bursa few weeks ago on the Labor Day     weekend. 15.Type II diabetes. 16.Diverticulosis per the patient per recent colonoscopy. 17.Status post right eye cataract surgery.  HOME MEDICATIONS:  The patient does not have a list of his home meds, but believes he is on, 1. Atenolol 50 mg p.o. b.i.d. 2. Aspirin 325 mg p.o. daily. 3. Metformin 500 mg p.o. b.i.d. 4. Vitamin D 1000 units p.o. daily. 5. Hyoscyamine 0.125 or 0.375 mg q.4 h. p.r.n. 6. Simvastatin 40 mg daily. 7. Proventil inhaler q.4 h. as needed. 8. Dexilant which was recently started and dose unknown.  SOCIAL HISTORY:  The patient is retired.  No tobacco use.  No alcohol use.  No IV drug use.  The patient is married.  FAMILY HISTORY:  Mother deceased age 35, cause unknown.  Father deceased at age 58, cause unknown, however did have kidney problems and coronary artery disease.  Family history is also positive for premature coronary artery disease.  REVIEW OF SYSTEMS:  As per HPI, otherwise negative.  PHYSICAL EXAMINATION:  VITAL SIGNS:  Temperature 98.5 up to 100.0, blood pressure 188/101 down to 157/81, pulse of 90 up to 109, respirations 20, and satting 97%. GENERAL:  The patient is well-developed, well-nourished gentleman in no acute cardiopulmonary distress. HEENT:  Normocephalic, atraumatic.  Pupils are equal, round, and reactive to light and accommodation.  Extraocular movements intact. Oropharynx is clear.  No lesions.  No exudates. NECK:  Supple.  No lymphadenopathy. RESPIRATORY:  Lungs are clear to auscultation bilaterally.  No wheezes. No crackles.  No rhonchi. CARDIOVASCULAR:  Regular rate and rhythm.  No murmurs, rubs, or gallops. ABDOMEN:  Soft, nontender, nondistended.  Positive bowel sounds. EXTREMITIES:  No clubbing, cyanosis, or edema. NEUROLOGIC:  The patient is alert and oriented x3.  Cranial Nerves II through XII are grossly intact with no focal deficits.  LABORATORY DATA:  On  admission UA; yellow, clear, specific gravity 1.022, pH of 6.5, glucose negative, bilirubin negative, ketones negative, blood negative, protein negative, urobilinogen 1.0, nitrite negative, leukocytes negative, and lipase of 83.  CMET; sodium 140, potassium 3.8, chloride 103, bicarb 26, glucose 146, BUN 16, creatinine 0.98, bilirubin 1.6, alk phosphatase 124, AST 175, ALT 129, protein of 6.9, albumin 3.7 and a calcium of 9.7.  Lactic acid level is at 2.0. CBC; white count of 13.1, hemoglobin of 13.1, hematocrit of 39, and platelet count of 277 with an ANC of 11.5.  Troponin I point-of-care cardiac 0.01.  A abdominal ultrasound shows tiny gallstones, gallbladder wall thickening, no hydronephrosis, small bilateral renal cysts, and hepatic cyst.  CT of the abdomen and pelvis shows mild inflammatory changes in the right upper abdomen adjacent to the pancreatic head, duodenum, and gallbladder.  Cholelithiasis correlate with lab evaluation for mild acute pancreatitis and consider right upper quadrant ultrasound to assess for acute cholecystitis.  A  chest x-ray shows central airway thickening with patchy bibasilar air space opacity possibly reflecting early pneumonia.  EKG shows some Q waves in lead III with some T-wave inversion, otherwise unremarkable.  ASSESSMENT AND PLAN:  Ms. Richard Ho is a pleasant 70 year old gentleman with history of coronary artery disease, hypertension, hyperlipidemia, irritable bowel syndrome, and gastroesophageal reflux disease presenting to the ED with worsening upper abdominal pain.  1. Acute pancreatitis, likely secondary to biliary pancreatitis.  The     patient does have elevated bilirubin and LFTs.  CT of the abdomen     and pelvis shows mild inflammatory changes in the right upper     quadrant next to the pancreatic head, duodenum, and the     gallbladder.  His lipase levels are elevated.  Right upper quadrant     ultrasound does have tiny gallstones and  gallbladder wall     thickening.  We will place the patient under bowel rest, n.p.o.     except ice chips, IV fluids, pain management.  We will check a     fasting lipid panel, we will place on IV Zosyn empirically.  GI is     following and we will consult with General Surgery as the patient.     Once his acute episode subsides, he may need a lap cholecystectomy. 2. Dehydration.  Hydrate with IV fluids. 3. Probable early pneumonia per chest x-ray.  The patient is afebrile,     however does have a low-grade temperature of 100.0, does have a     leukocytosis.  We will place empirically on IV Zosyn and follow.     Leukocytosis, likely secondary to probable early pneumonia  versus     secondary to acute pancreatitis.  Urinalysis negative.  We will     place him empirically on IV Zosyn and follow. 4. Hypertension.  We will place on IV Lopressor for now. 5. Gastroesophageal reflux disease.  Proton pump inhibitor. 6. Hypertension and check a fasting lipid panel.  We will hold statin     for now until taking p.o.'s. 7. Irritable bowel syndrome. 8. History of coronary artery disease is stable.  We will cycle     cardiac enzymes just in case his abdominal pain is anginal     equivalent as the patient has multiple risk factors.  We will     continue his home dose aspirin and a beta-blocker. 9. Type II diabetes.  Check a hemoglobin A1c.  We will hold metformin     for now.  Place on sliding scale insulin. 10.Transaminitis likely secondary to acute pancreatitis.  We will     follow and monitor for now.  GI is also following.  If worsening     with no improvement, may need a acute hepatitis panel and a     possible ERCP.  We     will monitor. 11.Prophylaxis.  Protonix for GI prophylaxis.  Lovenox for DVT     prophylaxis.  It has been a pleasure taking care of Mr. Orvel Cutsforth.     Ramiro Harvest, MD     DT/MEDQ  D:  02/22/2011  T:  02/22/2011  Job:  [id number]  cc:   Valetta Mole. Swords,  MD Rollene Rotunda, MD, [facility] Jyothi Elsie Amis, MD, [facility] Salt. Juanda Chance, MD  Electronically Signed by Ramiro Harvest MD on 03/13/2011 12:35:39 PM

## 2011-03-14 ENCOUNTER — Encounter: Payer: Self-pay | Admitting: Internal Medicine

## 2011-03-14 ENCOUNTER — Ambulatory Visit (INDEPENDENT_AMBULATORY_CARE_PROVIDER_SITE_OTHER): Payer: Medicare Other | Admitting: Internal Medicine

## 2011-03-14 VITALS — BP 138/84 | HR 84 | Temp 98.9°F | Ht 70.5 in | Wt 212.0 lb

## 2011-03-14 DIAGNOSIS — I10 Essential (primary) hypertension: Secondary | ICD-10-CM

## 2011-03-14 DIAGNOSIS — E119 Type 2 diabetes mellitus without complications: Secondary | ICD-10-CM

## 2011-03-14 DIAGNOSIS — E1129 Type 2 diabetes mellitus with other diabetic kidney complication: Secondary | ICD-10-CM | POA: Insufficient documentation

## 2011-03-14 DIAGNOSIS — Z23 Encounter for immunization: Secondary | ICD-10-CM

## 2011-03-14 DIAGNOSIS — I2510 Atherosclerotic heart disease of native coronary artery without angina pectoris: Secondary | ICD-10-CM

## 2011-03-14 MED ORDER — ATENOLOL 100 MG PO TABS: 50.0000 mg | ORAL_TABLET | Freq: Every day | ORAL | Status: DC

## 2011-03-14 NOTE — Progress Notes (Signed)
  Subjective:    Patient ID: Richard Ho, male    DOB: July 20, 1940, 70 y.o.   MRN: [id number]  HPI  cpx   Past Medical History  Diagnosis Date  . Asthma   . Coronary artery disease   . GERD (gastroesophageal reflux disease)   . Hyperlipidemia   . Myocardial infarct   . Nephrolithiasis   . Osteopenia   . HTN (hypertension)    Past Surgical History  Procedure Date  . Inguinal hernia repair   . Ptca   . Vasectomy   . Orif tibia & fibula fractures   . Angioplasty 1991  . Cholecystectomy 2012    reports that he has never smoked. He does not have any smokeless tobacco history on file. He reports that he does not drink alcohol or use illicit drugs. family history includes Allergies in his sister; Asthma in his mother; and Coronary artery disease in his other. No Known Allergies   Review of Systems     patient denies chest pain, shortness of breath, orthopnea. Denies lower extremity edema, abdominal pain, change in appetite, change in bowel movements. Patient denies rashes, musculoskeletal complaints. No other specific complaints in a complete review of systems.   Objective:   Physical Exam   Well-developed well-nourished male in no acute distress. HEENT exam atraumatic, normocephalic, extraocular muscles are intact. Neck is supple. No jugular venous distention no thyromegaly. Chest clear to auscultation without increased work of breathing. Cardiac exam S1 and S2 are regular. Abdominal exam active bowel sounds, soft, nontender. Extremities no edema. Neurologic exam she is alert without any motor sensory deficits. Gait is normal.     Assessment & Plan:  Well visit. Health maintenance up-to-date.

## 2011-03-16 NOTE — Assessment & Plan Note (Signed)
BP Readings from Last 3 Encounters:  03/14/11 138/84  08/22/08 118/88  08/02/08 130/70   Fair control. Continue current medications.

## 2011-03-16 NOTE — Assessment & Plan Note (Signed)
He has lost some weight since I've seen him previously. A1c seems much better controlled. I advised him to check his blood sugar periodically maybe 1-2 times a week. I advised him to continue weight loss.

## 2011-03-16 NOTE — Assessment & Plan Note (Signed)
Patient does not have any symptoms. Continue risk factor modification. He gets some of his care at the [facility].

## 2011-03-18 ENCOUNTER — Encounter (INDEPENDENT_AMBULATORY_CARE_PROVIDER_SITE_OTHER): Payer: Self-pay

## 2011-03-18 ENCOUNTER — Ambulatory Visit (INDEPENDENT_AMBULATORY_CARE_PROVIDER_SITE_OTHER): Payer: Medicare Other | Admitting: Radiology

## 2011-03-18 VITALS — BP 132/80 | HR 70 | Temp 97.1°F | Resp 14 | Ht 71.0 in | Wt 212.6 lb

## 2011-03-18 DIAGNOSIS — K810 Acute cholecystitis: Secondary | ICD-10-CM

## 2011-03-18 NOTE — Progress Notes (Signed)
Richard Ho [birth year] [id number] 03/18/2011   Richard Ho is a 70 y.o. male who had a laparoscopic cholecystectomy with intraoperative cholangiogram.  The pathology report confirmed cholecystitis.  The patient reports that they are feeling well with normal bowel movements and good appetite.  The pre-operative symptoms of abdominal pain, nausea, and vomiting have resolved.    Physical examination - Incisions appear well-healed with no sign of infection or bleeding.   Abdomen - soft, non-tender  Impression:  s/p laparoscopic cholecystectomy  Plan:  He may resume a regular diet and full activity.  He may follow-up on a PRN basis.

## 2011-03-20 ENCOUNTER — Telehealth: Payer: Self-pay | Admitting: Internal Medicine

## 2011-03-20 NOTE — Telephone Encounter (Signed)
Pt would like to switch from Dr Cato Mulligan to Dr Kirtland Bouchard. Ok to switch?

## 2011-03-20 NOTE — Discharge Summary (Signed)
NAMETADEN, WITTER NO.:  [id number]  MEDICAL RECORD NO.:  [id number]  LOCATION:  4737                         FACILITY:  [facility]  PHYSICIAN:  Altha Harm, MDDATE OF BIRTH:  12/17/1940  DATE OF ADMISSION:  02/22/2011 DATE OF DISCHARGE:  02/28/2011                              DISCHARGE SUMMARY   DISCHARGE DISPOSITION:  Home.  FINAL DISCHARGE MEDICATIONS: 1. Augmentin 875 mg p.o. b.i.d. for 1 day. 2. Oxycodone 5 mg tablets 5-10 mg p.o. q.4 hours as needed for pain. 3. Albuterol inhaler 2 puffs inhaled twice daily as needed. 4. Asmanex inhaler 220 mcg 1 puff inhaled twice daily as needed for     shortness of breath. 5. Aspirin enteric-coated 325 mg p.o. daily. 6. Atenolol 50 mg p.o. b.i.d. 7. Foradil 12 mcg inhaler 1 puff inhaled b.i.d. as needed for     shortness of breath. 8. Gabapentin 400 mg p.o. b.i.d. 9. Hyoscyamine SR 0.375 mg p.o. daily. 10.Meloxicam 15 mg p.o. daily.  Please note that the patient stopped     taking this medication on February 10, 2011, due to what he     considers negative reaction. 11.Metformin 500 mg p.o. b.i.d. 12.Multivitamin 1 tablet p.o. daily. 13.Simvastatin 40 mg p.o. daily. 14.Vitamin D3 1000 international units 1 tablet p.o. daily. 15.MiraLax 17 g in 8 ounces of fluid p.o. daily as needed for     constipation.  DISCHARGE DIAGNOSES: 1. Presumed aspiration pneumonia fully treated. 2. Laparoscopic cholecystectomy. 3. Hypertension. 4. Constipation resolved. 5. Irritable bowel syndrome. 6. Gastroesophageal reflux disease. 7. Diabetes type 2 controlled. 8. Coronary artery disease.  CONSULTANTS:  Dr. Carolynne Edouard of [facility].  PROCEDURES:  Status post laparoscopic cholecystectomy with intraoperative cholangiogram.  DIAGNOSTIC STUDIES: 1. Two-view chest x-ray on admission, which shows central airway     thickening with patchy bibasilar airspace opacities possibly     reflecting early pneumonia. 2.  CT abdomen and pelvis with contrast, which shows mild inflammatory     changes in the right upper abdomen adjacent to the pancreatic head,     duodenum, and gallbladder.  Cholelithiasis appear to correlate the     laboratory evaluations for mild acute pancreatitis and consider     right upper quadrant ultrasound to assess for acute cholecystitis. 3. Abdominal ultrasound, which shows tiny gallstones.  There is     gallbladder wall thickening.  No hydronephrosis.  Small bilateral     renal cysts.  Impression:  Abscess. 4. Intraoperative cholangiogram, which was negative.  PRIMARY CARE PHYSICIAN:  Valetta Mole. Swords, MD at [facility].  GASTROENTEROLOGIST:  Anselmo Rod, MD, [facility]  CARDIOLOGIST:  Rollene Rotunda, MD, [facility]  CHIEF COMPLAINT:  Upper abdominal pain, epigastric pain.  HISTORY OF PRESENT ILLNESS:  Please refer to the H and P by Dr. Janee Morn for the details of the HPI, however, in short, this is a 70 year old gentleman with hypertension, diabetes, and coronary artery disease who presented to the emergency room with worsening intense epigastric and upper abdominal pain that occurred beginning around 3:30 in the morning. The patient was referred to [place] for further evaluation and management.  HOSPITAL COURSE: 1. Acute cholecystitis status post laparoscopic  cholecystectomy.  The     patient was admitted and given bowel rest after his diagnostic     studies suggestive of cholecystitis.  General Surgery was consulted     and after evaluation for risk stratification, the patient was taken     for a vacuum cystectomy.  He did well.  His diet was advanced from     clear liquids to appropriate diabetic heart-healthy diet, which he     tolerated well without any difficulty.  The patient did have some     constipation and was treated with laxatives with good results.  The     patient is doing well postsurgical and at this time there is no     recommendation from surgery for follow  up with them. 2. Constipation.  The patient did have some constipation as noted was     treated with laxatives with good results.  The patient has been     given MiraLax for continued p.r.n. treatment. 3. Pneumonia.  The patient had an elevated white blood cell count with     some shortness of breath.  A chest x-ray was suggestive of     pneumonia and the treating physician at that time based on the     patient's clinical presentation opted to treat him apparently with     Zosyn for aspiration pneumonia, the patient will complete his final     day of treatment tomorrow with Augmentin. 4. From a respiratory standpoint, the patient is doing very well.     Please note that the treatment is more aggressive given the     patient's underlying chronic asthma that he has. 5. Coronary artery disease quiescent while hospitalized.  The patient     is continued on  __________. 6. Hypertension controlled on atenolol. 7. Diabetes type 2.  The patient is known to have diabetes type 2     which by hemoglobin A1c is well controlled.  The patient had his     metformin held due to the fact that he has a contrast study.     However, he is out of the need for this study and the need for     holding the metformin and the patient's metformin will be resumed.  The patient to follow up with [facility] on March 18, 2011, at 1:30 p.m.  DIETARY RESTRICTIONS:  The patient should be on a diabetic heart-healthy diet.  PHYSICAL RESTRICTIONS:  Activity as tolerated.  FOLLOWUP APPOINTMENTS:  The patient to return to the [facility] with [facility] on March 18, 2011, at 1:20 p.m.  The patient also to follow up with his primary care physician, Dr. Birdie Sons within 1 week.  Total time for this discharge process including face-to-face time approximately 40 minutes.     Altha Harm, MD     MAM/MEDQ  D:  02/28/2011  T:  02/28/2011  Job:  [id number]  Electronically Signed  by Marthann Schiller MD on 03/20/2011 07:22:49 AM

## 2011-03-20 NOTE — Telephone Encounter (Signed)
Ok with me 

## 2011-03-20 NOTE — Telephone Encounter (Signed)
ok 

## 2011-05-05 ENCOUNTER — Ambulatory Visit: Payer: Medicare Other | Admitting: Internal Medicine

## 2011-07-15 ENCOUNTER — Other Ambulatory Visit (INDEPENDENT_AMBULATORY_CARE_PROVIDER_SITE_OTHER): Payer: Medicare Other

## 2011-07-15 DIAGNOSIS — E119 Type 2 diabetes mellitus without complications: Secondary | ICD-10-CM

## 2011-07-15 LAB — HEPATIC FUNCTION PANEL
ALT: 21 U/L (ref 0–53)
AST: 18 U/L (ref 0–37)
Albumin: 4 g/dL (ref 3.5–5.2)
Alkaline Phosphatase: 75 U/L (ref 39–117)

## 2011-07-15 LAB — LIPID PANEL
HDL: 44.8 mg/dL (ref >39.00)
Triglycerides: 130 mg/dL (ref 0.0–149.0)

## 2011-07-15 LAB — BASIC METABOLIC PANEL
Calcium: 9.4 mg/dL (ref 8.4–10.5)
GFR: 68.1 mL/min (ref >60.00)
Potassium: 4.8 mEq/L (ref 3.5–5.1)
Sodium: 140 mEq/L (ref 135–145)

## 2011-07-15 LAB — HEMOGLOBIN A1C: Hgb A1c MFr Bld: 6.5 % (ref 4.6–6.5)

## 2011-07-22 ENCOUNTER — Ambulatory Visit (INDEPENDENT_AMBULATORY_CARE_PROVIDER_SITE_OTHER): Payer: Medicare Other | Admitting: Internal Medicine

## 2011-07-22 ENCOUNTER — Encounter: Payer: Self-pay | Admitting: Internal Medicine

## 2011-07-22 DIAGNOSIS — I2510 Atherosclerotic heart disease of native coronary artery without angina pectoris: Secondary | ICD-10-CM

## 2011-07-22 DIAGNOSIS — E785 Hyperlipidemia, unspecified: Secondary | ICD-10-CM

## 2011-07-22 DIAGNOSIS — I10 Essential (primary) hypertension: Secondary | ICD-10-CM

## 2011-07-22 DIAGNOSIS — E119 Type 2 diabetes mellitus without complications: Secondary | ICD-10-CM

## 2011-07-22 MED ORDER — ATENOLOL 50 MG PO TABS: ORAL_TABLET | ORAL | Status: DC

## 2011-07-22 MED ORDER — LEVOTHYROXINE SODIUM 25 MCG PO TABS: 25.0000 ug | ORAL_TABLET | Freq: Every day | ORAL | Status: DC

## 2011-07-22 NOTE — Progress Notes (Signed)
  Subjective:    Patient ID: Richard Ho, male    DOB: 1941-02-07, 71 y.o.   MRN: [id number]  HPI  71 year old patient who is in today for followup. He has a history of type 2 diabetes which has been well-controlled on metformin therapy. His also called the [facility]. He has reasonable place on Synthroid 0.25 mg daily. He has had a cholecystectomy in the fall of last year. He has coronary artery disease which has been stable.   Review of Systems  Constitutional: Negative for fever, chills, appetite change and fatigue.  HENT: Negative for hearing loss, ear pain, congestion, sore throat, trouble swallowing, neck stiffness, dental problem, voice change and tinnitus.   Eyes: Negative for pain, discharge and visual disturbance.  Respiratory: Negative for cough, chest tightness, wheezing and stridor.   Cardiovascular: Negative for chest pain, palpitations and leg swelling.  Gastrointestinal: Negative for nausea, vomiting, abdominal pain, diarrhea, constipation, blood in stool and abdominal distention.  Genitourinary: Negative for urgency, hematuria, flank pain, discharge, difficulty urinating and genital sores.  Musculoskeletal: Negative for myalgias, back pain, joint swelling, arthralgias and gait problem.  Skin: Negative for rash.  Neurological: Negative for dizziness, syncope, speech difficulty, weakness, numbness and headaches.  Hematological: Negative for adenopathy. Does not bruise/bleed easily.  Psychiatric/Behavioral: Negative for behavioral problems and dysphoric mood. The patient is not nervous/anxious.        Objective:   Physical Exam  Constitutional: He is oriented to person, place, and time. He appears well-developed.       Obese. Normal blood pressure  HENT:  Head: Normocephalic.  Right Ear: External ear normal.  Left Ear: External ear normal.  Eyes: Conjunctivae and EOM are normal.  Neck: Normal range of motion.  Cardiovascular: Normal rate and normal heart sounds.     Pulmonary/Chest: Breath sounds normal.  Abdominal: Bowel sounds are normal.  Musculoskeletal: Normal range of motion. He exhibits no edema and no tenderness.  Neurological: He is alert and oriented to person, place, and time.  Psychiatric: He has a normal mood and affect. His behavior is normal.          Assessment & Plan:   Diabetes mellitus. Well controlled Hypertension stable Coronary artery disease Dyslipidemia. Will continue present regimen

## 2011-07-22 NOTE — Patient Instructions (Signed)
Limit your sodium (Salt) intake   Please check your hemoglobin A1c every 3 months    It is important that you exercise regularly, at least 20 minutes 3 to 4 times per week.  If you develop chest pain or shortness of breath seek  medical attention.  You need to lose weight.  Consider a lower calorie diet and regular exercise. 

## 2011-10-22 ENCOUNTER — Encounter: Payer: Self-pay | Admitting: Internal Medicine

## 2011-10-22 ENCOUNTER — Ambulatory Visit (INDEPENDENT_AMBULATORY_CARE_PROVIDER_SITE_OTHER): Payer: Medicare Other | Admitting: Internal Medicine

## 2011-10-22 VITALS — BP 138/90 | Temp 98.0°F | Wt 215.0 lb

## 2011-10-22 DIAGNOSIS — I10 Essential (primary) hypertension: Secondary | ICD-10-CM

## 2011-10-22 DIAGNOSIS — E039 Hypothyroidism, unspecified: Secondary | ICD-10-CM

## 2011-10-22 DIAGNOSIS — E119 Type 2 diabetes mellitus without complications: Secondary | ICD-10-CM

## 2011-10-22 DIAGNOSIS — I2510 Atherosclerotic heart disease of native coronary artery without angina pectoris: Secondary | ICD-10-CM

## 2011-10-22 DIAGNOSIS — IMO0001 Reserved for inherently not codable concepts without codable children: Secondary | ICD-10-CM

## 2011-10-22 DIAGNOSIS — E785 Hyperlipidemia, unspecified: Secondary | ICD-10-CM

## 2011-10-22 LAB — HEMOGLOBIN A1C: Hgb A1c MFr Bld: 6.2 % (ref 4.6–6.5)

## 2011-10-22 MED ORDER — GLUCOSE BLOOD VI STRP: ORAL_STRIP | Status: DC

## 2011-10-22 MED ORDER — RAMIPRIL 5 MG PO CAPS: 5.0000 mg | ORAL_CAPSULE | Freq: Every day | ORAL | Status: DC

## 2011-10-22 NOTE — Progress Notes (Signed)
  Subjective:    Patient ID: Richard Ho, male    DOB: 1940/07/30, 71 y.o.   MRN: [id number]  HPI  71 year old patient who is seen today for followup of his multiple medical problems. He has coronary artery disease which has been stable he has treated dyslipidemia history of hypertension. He has had a prior MI. He is also followed at the [facility] he is on vitamin D supplementation due to to a history of deficiency. He has chronic mild low-grade anemia. Complaints today are mainly orthopedic.   Review of Systems  Constitutional: Negative for fever, chills, appetite change and fatigue.  HENT: Negative for hearing loss, ear pain, congestion, sore throat, trouble swallowing, neck stiffness, dental problem, voice change and tinnitus.   Eyes: Negative for pain, discharge and visual disturbance.  Respiratory: Negative for cough, chest tightness, wheezing and stridor.   Cardiovascular: Negative for chest pain, palpitations and leg swelling.  Gastrointestinal: Negative for nausea, vomiting, abdominal pain, diarrhea, constipation, blood in stool and abdominal distention.  Genitourinary: Negative for urgency, hematuria, flank pain, discharge, difficulty urinating and genital sores.  Musculoskeletal: Positive for arthralgias and gait problem. Negative for myalgias, back pain and joint swelling.  Skin: Negative for rash.  Neurological: Negative for dizziness, syncope, speech difficulty, weakness, numbness and headaches.  Hematological: Negative for adenopathy. Does not bruise/bleed easily.  Psychiatric/Behavioral: Negative for behavioral problems and dysphoric mood. The patient is not nervous/anxious.        Objective:   Physical Exam  Constitutional: He is oriented to person, place, and time. He appears well-developed.        Blood pressure 140/90  HENT:  Head: Normocephalic.  Right Ear: External ear normal.  Left Ear: External ear normal.  Eyes: Conjunctivae and EOM are normal.  Neck:  Normal range of motion.  Cardiovascular: Normal rate and normal heart sounds.   Pulmonary/Chest: Breath sounds normal.  Abdominal: Bowel sounds are normal.  Musculoskeletal: Normal range of motion. He exhibits no edema and no tenderness.  Neurological: He is alert and oriented to person, place, and time.  Psychiatric: He has a normal mood and affect. His behavior is normal.          Assessment & Plan:   Diabetes mellitus. We'll check a hemoglobin A1c. Appears to be tightly controlled Coronary artery disease stable Hypertension. We'll add Altace 5 mg daily for better BP control and cardio and  renal protection Hypothyroidism. TSH was slightly elevated we'll repeat today. May need adjustment of supplementation

## 2011-10-22 NOTE — Patient Instructions (Signed)
Limit your sodium (Salt) intake    It is important that you exercise regularly, at least 20 minutes 3 to 4 times per week.  If you develop chest pain or shortness of breath seek  medical attention.   Please check your hemoglobin A1c every 3 months   

## 2011-10-23 ENCOUNTER — Telehealth: Payer: Self-pay | Admitting: Internal Medicine

## 2011-10-23 MED ORDER — GLUCOSE BLOOD VI STRP: ORAL_STRIP | Status: DC

## 2011-10-23 MED ORDER — RAMIPRIL 5 MG PO CAPS: 5.0000 mg | ORAL_CAPSULE | Freq: Every day | ORAL | Status: DC

## 2011-10-23 NOTE — Telephone Encounter (Signed)
Change made to chart and new rx's sent  Pt aware

## 2011-10-23 NOTE — Telephone Encounter (Signed)
Pt was in yesterday for an office visit and medication refills. Refills were sent to wrong pharmacy they were to be sent to [facility] [address]  Please contact pt

## 2011-10-24 ENCOUNTER — Other Ambulatory Visit: Payer: Self-pay

## 2011-10-24 MED ORDER — GLUCOSE BLOOD VI STRP: 1.0000 | ORAL_STRIP | Freq: Every day | Status: DC

## 2011-12-02 ENCOUNTER — Telehealth: Payer: Self-pay | Admitting: Internal Medicine

## 2011-12-02 NOTE — Telephone Encounter (Signed)
Pt requesting results of labs. Please contact °

## 2011-12-02 NOTE — Telephone Encounter (Signed)
Called pt labs normal

## 2011-12-22 ENCOUNTER — Other Ambulatory Visit: Payer: Self-pay | Admitting: Internal Medicine

## 2012-01-08 HISTORY — PX: CARDIAC CATHETERIZATION: SHX172

## 2012-01-12 ENCOUNTER — Ambulatory Visit: Payer: Medicare Other | Admitting: Internal Medicine

## 2012-01-13 ENCOUNTER — Ambulatory Visit: Payer: Medicare Other | Admitting: Internal Medicine

## 2012-01-16 ENCOUNTER — Ambulatory Visit: Payer: Medicare Other | Admitting: Internal Medicine

## 2012-01-26 ENCOUNTER — Ambulatory Visit (INDEPENDENT_AMBULATORY_CARE_PROVIDER_SITE_OTHER): Payer: Medicare Other | Admitting: Internal Medicine

## 2012-01-26 ENCOUNTER — Encounter: Payer: Self-pay | Admitting: Internal Medicine

## 2012-01-26 VITALS — BP 112/80 | Temp 98.2°F | Wt 222.0 lb

## 2012-01-26 DIAGNOSIS — I10 Essential (primary) hypertension: Secondary | ICD-10-CM

## 2012-01-26 DIAGNOSIS — I252 Old myocardial infarction: Secondary | ICD-10-CM

## 2012-01-26 DIAGNOSIS — E785 Hyperlipidemia, unspecified: Secondary | ICD-10-CM

## 2012-01-26 DIAGNOSIS — E119 Type 2 diabetes mellitus without complications: Secondary | ICD-10-CM

## 2012-01-26 DIAGNOSIS — I2510 Atherosclerotic heart disease of native coronary artery without angina pectoris: Secondary | ICD-10-CM

## 2012-01-26 MED ORDER — ASPIRIN 81 MG PO TBEC: 81.0000 mg | DELAYED_RELEASE_TABLET | Freq: Every day | ORAL | Status: AC

## 2012-01-26 MED ORDER — TRAMADOL HCL 50 MG PO TABS: 50.0000 mg | ORAL_TABLET | Freq: Three times a day (TID) | ORAL | Status: AC | PRN

## 2012-01-26 MED ORDER — NORTRIPTYLINE HCL 25 MG PO CAPS: 25.0000 mg | ORAL_CAPSULE | Freq: Every day | ORAL | Status: DC

## 2012-01-26 NOTE — Progress Notes (Signed)
Subjective:    Patient ID: Richard Ho, male    DOB: 1941-03-16, 71 y.o.   MRN: [id number]  HPI  71 year old patient who is seen today for followup. Medical problems include coronary artery disease and a history of prior MI. He has had angioplasty performed in 1991 involving the LAD and had an MI in 1996. He is a history of worsening exertional fatigue. No recent stress tests. He is also followed at [facility] and has been on Neurontin for diabetic neuropathy. He continues to have paresthesias involving primarily his feet. He is treated hypertension and dyslipidemia. He has type 2 diabetes and hypothyroidism. A hemoglobin A1c 2 months ago was well controlled. A lipid panel also reviewed.   Past Medical History  Diagnosis Date  . Asthma   . Coronary artery disease   . GERD (gastroesophageal reflux disease)   . Hyperlipidemia   . Myocardial infarct   . Nephrolithiasis   . Osteopenia   . HTN (hypertension)   . Diabetes mellitus   . Hearing loss     History   Social History  . Marital Status: Married    Spouse Name: N/A    Number of Children: N/A  . Years of Education: N/A   Occupational History  . Not on file.   Social History Main Topics  . Smoking status: Never Smoker   . Smokeless tobacco: Never Used  . Alcohol Use: No  . Drug Use: No  . Sexually Active: Not on file   Other Topics Concern  . Not on file   Social History Narrative  . No narrative on file    Past Surgical History  Procedure Date  . Inguinal hernia repair   . Ptca   . Vasectomy   . Orif tibia & fibula fractures   . Angioplasty 1991  . Eye surgery 2010    cataract - right  . Cholecystectomy 2012    Family History  Problem Relation Age of Onset  . Asthma Mother   . Allergies Sister   . Coronary artery disease Other     Allergies  Allergen Reactions  . Meloxicam Other (See Comments)    Severe gastritis    Current Outpatient Prescriptions on File Prior to Visit  Medication Sig  Dispense Refill  . albuterol (PROVENTIL HFA;VENTOLIN HFA) 108 (90 BASE) MCG/ACT inhaler Inhale 2 puffs into the lungs every 4 (four) hours as needed.        Marland Kitchen atenolol (TENORMIN) 100 MG tablet take 1/2 tablet by mouth twice a day  30 tablet  5  . atenolol (TENORMIN) 50 MG tablet 1 tablet twice daily  180 tablet  3  . gabapentin (NEURONTIN) 600 MG tablet Take 600 mg by mouth 2 (two) times daily.      Marland Kitchen glucose blood (PRECISION XTRA TEST STRIPS) test strip 1 each by Other route daily. Use daily  100 each  6  . Hyoscyamine Sulfate (HYOMAX-DT) 0.375 MG TBCR Take by mouth as needed.        Marland Kitchen levothyroxine (LEVOTHROID) 25 MCG tablet Take 1 tablet (25 mcg total) by mouth daily.  90 tablet  6  . metFORMIN (GLUCOPHAGE) 500 MG tablet Take 500 mg by mouth 2 (two) times daily with a meal.        . Multiple Vitamin (MULTIVITAMIN) tablet Take 1 tablet by mouth daily.        . nitroGLYCERIN (NITROSTAT) 0.4 MG SL tablet Place 0.4 mg under the tongue daily.        Marland Kitchen  pantoprazole (PROTONIX) 40 MG tablet Take 40 mg by mouth daily. Take one half hour before eating.       . ramipril (ALTACE) 5 MG capsule Take 1 capsule (5 mg total) by mouth daily.  90 capsule  3  . simvastatin (ZOCOR) 80 MG tablet Take 80 mg by mouth. Take 1/2 at bedtime.       . nortriptyline (PAMELOR) 25 MG capsule Take 1 capsule (25 mg total) by mouth at bedtime.  90 capsule  4    BP 112/80  Temp 98.2 F (36.8 C) (Oral)  Wt 222 lb (100.699 kg)  SpO2 97%       Review of Systems  Constitutional: Positive for fatigue. Negative for fever, chills and appetite change.  HENT: Negative for hearing loss, ear pain, congestion, sore throat, trouble swallowing, neck stiffness, dental problem, voice change and tinnitus.   Eyes: Negative for pain, discharge and visual disturbance.  Respiratory: Negative for cough, chest tightness, wheezing and stridor.   Cardiovascular: Negative for chest pain, palpitations and leg swelling.  Gastrointestinal:  Negative for nausea, vomiting, abdominal pain, diarrhea, constipation, blood in stool and abdominal distention.  Genitourinary: Negative for urgency, hematuria, flank pain, discharge, difficulty urinating and genital sores.  Musculoskeletal: Negative for myalgias, back pain, joint swelling, arthralgias and gait problem.  Skin: Negative for rash.  Neurological: Positive for weakness and numbness. Negative for dizziness, syncope, speech difficulty and headaches.  Hematological: Negative for adenopathy. Does not bruise/bleed easily.  Psychiatric/Behavioral: Negative for behavioral problems and dysphoric mood. The patient is not nervous/anxious.        Objective:   Physical Exam  Constitutional: He is oriented to person, place, and time. He appears well-developed.  HENT:  Head: Normocephalic.  Right Ear: External ear normal.  Left Ear: External ear normal.  Eyes: Conjunctivae and EOM are normal.  Neck: Normal range of motion.  Cardiovascular: Normal rate and normal heart sounds.   Pulmonary/Chest: Breath sounds normal.  Abdominal: Bowel sounds are normal.  Musculoskeletal: Normal range of motion. He exhibits no edema and no tenderness.  Neurological: He is alert and oriented to person, place, and time.  Psychiatric: He has a normal mood and affect. His behavior is normal.          Assessment & Plan:    coronary artery disease. Recent history of exertional fatigue. No recent stress test for risk stratification. We'll set up for nuclear medicine stress test Diabetes mellitus stable Hypertension stable Dyslipidemia. Well-controlled  Recheck 3 months

## 2012-01-26 NOTE — Patient Instructions (Addendum)
Please check your hemoglobin A1c every 3 months    It is important that you exercise regularly, at least 20 minutes 3 to 4 times per week.  If you develop chest pain or shortness of breath seek  medical attention.  You need to lose weight.  Consider a lower calorie diet and regular exercise.  Limit your sodium (Salt) intake Diabetic Neuropathy Diabetic neuropathy is a common complication caused by diabetes. Neuropathy is a term that means nerve disease or damage. If your diabetes is uncontrolled and you have high blood glucose (sugar) levels, over time, this can lead to damage to nerves throughout your body. There are three types of diabetic neuropathy:    Peripheral.   Autonomic.   Focal.  PERIPHERAL NEUROPATHY Peripheral neuropathy is the most common form of diabetic neuropathy. It causes damage to the nerves of the feet and legs and eventually the hands and arms.   SYMPTOMS   Peripheral neuropathy occurs slowly over time. The peripheral nerves sense touch, hot and cold, and pain. When these nerves no longer work:    Your feet become numb.   You can no longer feel pressure or pain in your feet.   You may have burning, stabbing or aching pain.  This can lead to:  Thick calluses over pressure areas.   Pressure sores.   Ulcers. Ulcers can become infected with germs (bacteria) and can even lead to infection in the bones of the feet.  DIAGNOSIS   The diagnosis of diabetic neuropathy is difficult at best. Sensory function testing can be done with:  Light touch using a monofilament.   Vibration with tuning fork.   Sharp sensation with pin prick  Other tests that can help diagnose neuropathy are:  Nerve Conduction Velocities (NCV). This checks the transmission of electrical current through a nerve.   Electromyography (EMG). This shows how muscles respond to electrical signals transmitted by nearby nerves.   Quantitative sensory testing, which is used to assess how your nerves  respond to vibration and changes in temperature.  AUTONOMIC NEUROPATHY The autonomic nervous system controls functions that you do not think about. Examples would be:    Heart beat.   Regulation of body temperature.   Blood pressure.   Urination.   Digestion.   Sweating.   Sexual function.  SYMPTOMS   The symptoms of autonomic neuropathy vary depending on which nerves are affected.    There can be problems with digestion such as:   Feeling sick to your stomach (nausea).   Vomiting.   Bloating.   Constipation.   Diarrhea.   Abdominal pain.   Difficulty with urination may occur because of the inability to sense when your bladder is full. You may have urine leakage (incontinence) or inability to empty your bladder completely (retention).   Palpitations or a feeling of an abnormal heart beat.   Blood pressure drops on arising (orthostatic hypotension). This can happen when you first sit up or stand up. It causes you to feel:   Dizzy.   Weak.   Faint.   Sexual functioning:   In men, inability to attain and maintain an erection.   In women, vaginal dryness and problems with decreased sexual desire and arousal.  DIAGNOSIS   Diagnosis is often based on reported symptoms. Tell your medical caregiver if you experience:    Dizziness.   Constipation.   Diarrhea.   Inappropriate urination or inability to urinate.   Inability to get or maintain an erection.  Tests  that may be done include:  An EKG or Holter Monitor. These are tests that can help show problems with the heart rate or heart rhythm.   X-rays can be used to find if there are problems with your ability to properly empty food from your stomach into the small intestine after eating.  FOCAL NEUROPATHY Focal neuropathy affects just one nerve tract and occurs suddenly. However, it usually improves by itself over time. It does not cause long term damage, and treatments are usually needed only until the  problem improves. SYMPTOMS   Examples include:    Abnormal eye movements or abnormal alignment of both eyes.   Weakness in the wrist.   Foot drop, which results in inability to lift the foot properly. This causes abnormal walking or foot movement.  DIAGNOSIS   Diagnosis is made based on your symptoms and what your caregiver finds on your exam. Other tests that may be done include:  Nerve Conduction Velocities (NCV). This checks the transmission of electrical current through a nerve.   Electromyography (EMG). This shows how muscles respond to electrical signals transmitted by nearby nerves.   Quantitative sensory testing, which is used to assess how your nerves respond to vibration and changes in temperature.  TREATMENT Once nerve damage occurs it cannot be reversed. The goal of treatment is to keep the disease from getting worse. If it gets worse, it will affect more nerve fibers. Controlling your blood (sugar) is the key. You will need to keep your blood glucose and A1c at the target range prescribed by your caregiver. Things that will help control blood glucose levels include:  Blood glucose monitoring.   Meal planning.   Physical activity.   Diabetes medication.  Over time, maintaining lower blood glucose levels helps lessen symptoms. Sometimes, prescription pain medicine is needed. Focal neuropathy can be painful and unpredictable and occurs most often in older adults with diabetes.   SEEK MEDICAL CARE IF:    You develop peripheral nerve symptoms such as burning, numbness, or pain in your feet, legs or hands.   You develop autonomic nerve symptoms such as:   Dizziness.   Abnormal urinary control.   Inability to get an erection.   You develop focal nerve symptoms such as sudden abnormal eye movements or sudden foot drop.  Document Released: 08/04/2001 Document Revised: 05/15/2011 Document Reviewed: 11/03/2008 [facility] Patient Information 2012 [place], [place].

## 2012-01-27 ENCOUNTER — Emergency Department (HOSPITAL_COMMUNITY): Payer: Medicare Other

## 2012-01-27 ENCOUNTER — Inpatient Hospital Stay (HOSPITAL_COMMUNITY)
Admission: EM | Admit: 2012-01-27 | Discharge: 2012-01-29 | DRG: 287 | Disposition: A | Discharge status: Home / Self Care | Payer: Medicare Other | Attending: Cardiology | Admitting: Cardiology

## 2012-01-27 ENCOUNTER — Encounter (HOSPITAL_COMMUNITY): Payer: Self-pay | Admitting: *Deleted

## 2012-01-27 DIAGNOSIS — D539 Nutritional anemia, unspecified: Secondary | ICD-10-CM

## 2012-01-27 DIAGNOSIS — I2510 Atherosclerotic heart disease of native coronary artery without angina pectoris: Principal | ICD-10-CM | POA: Diagnosis present

## 2012-01-27 DIAGNOSIS — K219 Gastro-esophageal reflux disease without esophagitis: Secondary | ICD-10-CM | POA: Diagnosis present

## 2012-01-27 DIAGNOSIS — Z79899 Other long term (current) drug therapy: Secondary | ICD-10-CM

## 2012-01-27 DIAGNOSIS — E119 Type 2 diabetes mellitus without complications: Secondary | ICD-10-CM | POA: Diagnosis present

## 2012-01-27 DIAGNOSIS — R079 Chest pain, unspecified: Secondary | ICD-10-CM

## 2012-01-27 DIAGNOSIS — Z8249 Family history of ischemic heart disease and other diseases of the circulatory system: Secondary | ICD-10-CM

## 2012-01-27 DIAGNOSIS — E039 Hypothyroidism, unspecified: Secondary | ICD-10-CM | POA: Diagnosis present

## 2012-01-27 DIAGNOSIS — I10 Essential (primary) hypertension: Secondary | ICD-10-CM | POA: Diagnosis present

## 2012-01-27 DIAGNOSIS — Z7982 Long term (current) use of aspirin: Secondary | ICD-10-CM

## 2012-01-27 DIAGNOSIS — E1129 Type 2 diabetes mellitus with other diabetic kidney complication: Secondary | ICD-10-CM | POA: Diagnosis present

## 2012-01-27 DIAGNOSIS — I252 Old myocardial infarction: Secondary | ICD-10-CM

## 2012-01-27 DIAGNOSIS — I200 Unstable angina: Secondary | ICD-10-CM | POA: Diagnosis present

## 2012-01-27 DIAGNOSIS — Z825 Family history of asthma and other chronic lower respiratory diseases: Secondary | ICD-10-CM

## 2012-01-27 DIAGNOSIS — Z9861 Coronary angioplasty status: Secondary | ICD-10-CM

## 2012-01-27 DIAGNOSIS — J45909 Unspecified asthma, uncomplicated: Secondary | ICD-10-CM | POA: Diagnosis present

## 2012-01-27 DIAGNOSIS — E785 Hyperlipidemia, unspecified: Secondary | ICD-10-CM | POA: Diagnosis present

## 2012-01-27 LAB — HEPATIC FUNCTION PANEL
ALT: 19 U/L (ref 0–53)
Alkaline Phosphatase: 72 U/L (ref 39–117)
Bilirubin, Direct: <0.1 mg/dL (ref 0.0–0.3)
Total Bilirubin: 0.4 mg/dL (ref 0.3–1.2)

## 2012-01-27 LAB — POCT I-STAT, CHEM 8
BUN: 18 mg/dL (ref 6–23)
Calcium, Ion: 1.31 mmol/L — ABNORMAL HIGH (ref 1.13–1.30)
Creatinine, Ser: 1.1 mg/dL (ref 0.50–1.35)
Glucose, Bld: 140 mg/dL — ABNORMAL HIGH (ref 70–99)
TCO2: 25 mmol/L (ref 0–100)

## 2012-01-27 LAB — CARDIAC PANEL(CRET KIN+CKTOT+MB+TROPI)
CK, MB: 2.8 ng/mL (ref 0.3–4.0)
Relative Index: INVALID (ref 0.0–2.5)
Troponin I: <0.3 ng/mL (ref <0.30)

## 2012-01-27 LAB — CBC WITH DIFFERENTIAL/PLATELET
Basophils Absolute: 0 10*3/uL (ref 0.0–0.1)
Eosinophils Relative: 1 % (ref 0–5)
HCT: 38.1 % — ABNORMAL LOW (ref 39.0–52.0)
Lymphocytes Relative: 12 % (ref 12–46)
Lymphs Abs: 1 10*3/uL (ref 0.7–4.0)
MCV: 100.5 fL — ABNORMAL HIGH (ref 78.0–100.0)
Monocytes Absolute: 0.3 10*3/uL (ref 0.1–1.0)
Monocytes Relative: 4 % (ref 3–12)
RDW: 13.6 % (ref 11.5–15.5)
WBC: 8.8 10*3/uL (ref 4.0–10.5)

## 2012-01-27 LAB — APTT: aPTT: 29 seconds (ref 24–37)

## 2012-01-27 LAB — PROTIME-INR
INR: 0.96 (ref 0.00–1.49)
Prothrombin Time: 13 seconds (ref 11.6–15.2)

## 2012-01-27 MED ORDER — RAMIPRIL 5 MG PO CAPS
5.0000 mg | ORAL_CAPSULE | Freq: Every day | ORAL | Status: DC
  Administered 2012-01-28 – 2012-01-29 (×2): 5 mg via ORAL
  Filled 2012-01-27 (×2): qty 1

## 2012-01-27 MED ORDER — NITROGLYCERIN IN D5W 200-5 MCG/ML-% IV SOLN
INTRAVENOUS | Status: AC
  Administered 2012-01-27: 10 ug/min via INTRAVENOUS
  Filled 2012-01-27: qty 250

## 2012-01-27 MED ORDER — DIAZEPAM 5 MG PO TABS
5.0000 mg | ORAL_TABLET | ORAL | Status: AC
  Administered 2012-01-28: 5 mg via ORAL
  Filled 2012-01-27: qty 1

## 2012-01-27 MED ORDER — VITAMIN D 50 MCG (2000 UT) PO CAPS: 2000.0000 [IU] | ORAL_CAPSULE | Freq: Every day | ORAL | Status: DC

## 2012-01-27 MED ORDER — NITROGLYCERIN IN D5W 200-5 MCG/ML-% IV SOLN: 5.0000 ug/min | INTRAVENOUS | Status: DC

## 2012-01-27 MED ORDER — ONE-DAILY MULTI VITAMINS PO TABS: 1.0000 | ORAL_TABLET | Freq: Every day | ORAL | Status: DC

## 2012-01-27 MED ORDER — SODIUM CHLORIDE 0.9 % IV SOLN
INTRAVENOUS | Status: DC
  Administered 2012-01-28 (×2): via INTRAVENOUS

## 2012-01-27 MED ORDER — ACETAMINOPHEN 325 MG PO TABS: 650.0000 mg | ORAL_TABLET | ORAL | Status: DC | PRN

## 2012-01-27 MED ORDER — ATORVASTATIN CALCIUM 40 MG PO TABS
40.0000 mg | ORAL_TABLET | Freq: Every day | ORAL | Status: DC
  Administered 2012-01-27 – 2012-01-28 (×2): 40 mg via ORAL
  Filled 2012-01-27 (×3): qty 1

## 2012-01-27 MED ORDER — ASPIRIN 81 MG PO TBEC: 81.0000 mg | DELAYED_RELEASE_TABLET | Freq: Every day | ORAL | Status: DC

## 2012-01-27 MED ORDER — ONDANSETRON HCL 4 MG/2ML IJ SOLN
INTRAMUSCULAR | Status: AC
  Administered 2012-01-27: 4 mg
  Filled 2012-01-27: qty 2

## 2012-01-27 MED ORDER — SODIUM CHLORIDE 0.9 % IV SOLN: 250.0000 mL | INTRAVENOUS | Status: DC | PRN

## 2012-01-27 MED ORDER — ZOLPIDEM TARTRATE 5 MG PO TABS
5.0000 mg | ORAL_TABLET | Freq: Every evening | ORAL | Status: DC | PRN
  Administered 2012-01-27 – 2012-01-28 (×2): 5 mg via ORAL
  Filled 2012-01-27 (×2): qty 1

## 2012-01-27 MED ORDER — ALBUTEROL SULFATE HFA 108 (90 BASE) MCG/ACT IN AERS
2.0000 | INHALATION_SPRAY | RESPIRATORY_TRACT | Status: DC | PRN
  Administered 2012-01-27: 2 via RESPIRATORY_TRACT
  Filled 2012-01-27: qty 6.7

## 2012-01-27 MED ORDER — DIAZEPAM 5 MG PO TABS: 5.0000 mg | ORAL_TABLET | ORAL | Status: DC

## 2012-01-27 MED ORDER — ASPIRIN EC 81 MG PO TBEC
81.0000 mg | DELAYED_RELEASE_TABLET | Freq: Every day | ORAL | Status: DC
  Administered 2012-01-28 – 2012-01-29 (×2): 81 mg via ORAL
  Filled 2012-01-27 (×3): qty 1

## 2012-01-27 MED ORDER — LEVOTHYROXINE SODIUM 25 MCG PO TABS
25.0000 ug | ORAL_TABLET | Freq: Every day | ORAL | Status: DC
  Administered 2012-01-28 – 2012-01-29 (×2): 25 ug via ORAL
  Filled 2012-01-27 (×3): qty 1

## 2012-01-27 MED ORDER — NITROGLYCERIN IN D5W 200-5 MCG/ML-% IV SOLN
10.0000 ug/min | INTRAVENOUS | Status: DC
  Administered 2012-01-27: 10 ug/min via INTRAVENOUS

## 2012-01-27 MED ORDER — HEPARIN (PORCINE) IN NACL 100-0.45 UNIT/ML-% IJ SOLN
1450.0000 [IU]/h | INTRAMUSCULAR | Status: DC
  Administered 2012-01-27: 1250 [IU]/h via INTRAVENOUS
  Administered 2012-01-28 (×2): 1450 [IU]/h via INTRAVENOUS
  Filled 2012-01-27 (×4): qty 250

## 2012-01-27 MED ORDER — ALPRAZOLAM 0.25 MG PO TABS: 0.2500 mg | ORAL_TABLET | Freq: Two times a day (BID) | ORAL | Status: DC | PRN

## 2012-01-27 MED ORDER — HYOSCYAMINE SULFATE CR 0.375 MG PO CP12: 375.0000 ug | ORAL_CAPSULE | Freq: Every day | ORAL | Status: DC | PRN

## 2012-01-27 MED ORDER — SODIUM CHLORIDE 0.9 % IJ SOLN: 3.0000 mL | INTRAMUSCULAR | Status: DC | PRN

## 2012-01-27 MED ORDER — PANTOPRAZOLE SODIUM 40 MG PO TBEC
40.0000 mg | DELAYED_RELEASE_TABLET | Freq: Every day | ORAL | Status: DC
  Administered 2012-01-28 – 2012-01-29 (×2): 40 mg via ORAL
  Filled 2012-01-27 (×2): qty 1

## 2012-01-27 MED ORDER — ADULT MULTIVITAMIN W/MINERALS CH
1.0000 | ORAL_TABLET | Freq: Every day | ORAL | Status: DC
  Administered 2012-01-28 – 2012-01-29 (×2): 1 via ORAL
  Filled 2012-01-27 (×3): qty 1

## 2012-01-27 MED ORDER — ATENOLOL 50 MG PO TABS
50.0000 mg | ORAL_TABLET | Freq: Two times a day (BID) | ORAL | Status: DC
  Administered 2012-01-27 – 2012-01-29 (×4): 50 mg via ORAL
  Filled 2012-01-27 (×5): qty 1

## 2012-01-27 MED ORDER — NORTRIPTYLINE HCL 25 MG PO CAPS
25.0000 mg | ORAL_CAPSULE | Freq: Every day | ORAL | Status: DC
  Administered 2012-01-27 – 2012-01-28 (×2): 25 mg via ORAL
  Filled 2012-01-27 (×3): qty 1

## 2012-01-27 MED ORDER — ONDANSETRON HCL 4 MG/2ML IJ SOLN: 4.0000 mg | Freq: Four times a day (QID) | INTRAMUSCULAR | Status: DC | PRN

## 2012-01-27 MED ORDER — GABAPENTIN 300 MG PO CAPS
300.0000 mg | ORAL_CAPSULE | Freq: Every day | ORAL | Status: DC
  Administered 2012-01-28: 300 mg via ORAL
  Filled 2012-01-27 (×4): qty 1

## 2012-01-27 MED ORDER — SODIUM CHLORIDE 0.9 % IJ SOLN
3.0000 mL | Freq: Two times a day (BID) | INTRAMUSCULAR | Status: DC
  Administered 2012-01-28 – 2012-01-29 (×2): 3 mL via INTRAVENOUS

## 2012-01-27 MED ORDER — TRAMADOL HCL 50 MG PO TABS
50.0000 mg | ORAL_TABLET | Freq: Three times a day (TID) | ORAL | Status: DC | PRN
Start: 2012-01-27 — End: 2012-01-29

## 2012-01-27 MED ORDER — ASPIRIN 81 MG PO CHEW
324.0000 mg | CHEWABLE_TABLET | ORAL | Status: AC
  Administered 2012-01-28: 324 mg via ORAL
  Filled 2012-01-27: qty 4

## 2012-01-27 MED ORDER — VITAMIN D3 25 MCG (1000 UNIT) PO TABS
2000.0000 [IU] | ORAL_TABLET | Freq: Every day | ORAL | Status: DC
  Administered 2012-01-28 – 2012-01-29 (×2): 2000 [IU] via ORAL
  Filled 2012-01-27 (×2): qty 2

## 2012-01-27 MED ORDER — HYOSCYAMINE SULFATE ER 0.375 MG PO TBCR: 1.0000 | EXTENDED_RELEASE_TABLET | Freq: Every day | ORAL | Status: DC | PRN

## 2012-01-27 MED ORDER — NITROGLYCERIN 0.4 MG SL SUBL: 0.4000 mg | SUBLINGUAL_TABLET | SUBLINGUAL | Status: DC | PRN

## 2012-01-27 MED ORDER — HEPARIN BOLUS VIA INFUSION
4000.0000 [IU] | Freq: Once | INTRAVENOUS | Status: AC
  Administered 2012-01-27: 4000 [IU] via INTRAVENOUS
  Filled 2012-01-27: qty 4000

## 2012-01-27 NOTE — ED Notes (Signed)
Per EMS- pt has been having chest pain intermittent for 2-3 days. Pt has substernal chest pain that radiates to back. Pt has nausea and SOB as well. Pt was NSR on monitor with occasional PVC according to EMS. Pt received 2 nitro, 324 mg of Asprin, and 4mg  Zofran prior to arrival. BP 153/100

## 2012-01-27 NOTE — H&P (Signed)
History and Physical   Patient ID: Richard Ho MRN: [id number], DOB/AGE: Sep 25, 1940 71 y.o. Date of Encounter: 01/27/2012  Primary Physician: Rogelia Boga, MD Primary Cardiologist: [facility] in 2005, [place] prior   Chief Complaint:  Chest pain  HPI: 71 year old male with a history of CAD was seen by Dr Kirtland Bouchard yesterday for fatigue with exertion, decreased energy and ?DOE. A stress test was scheduled.  This am, about 10:30, pt had onset of back pain radiating around to his chest while he was eating. It was a squeezing, reaching an 8/10. He had associated diaphoresis and N&V, some SOB.  He called EMS when his symptoms did not resolve. He took ASA 81 mg x 4 as directed. EMS gave him sl NTG x 3 and Zofran. His symptoms improved and he got a headache. He has never had pain like this before. The chest squeezing lasted about 2 hours total, waxing and waning. His previous MI and anginal symptom was only chest pain. Currently, he is resting comfortably.  He has a history of stable, chronic anginal chest tightness that has not been this bad. It would radiate up into both arms and into his jaw. It would resolve with rest.  [name] had procedures (sounds like angioplasty) in [place] and in [place] in the past.  I do not have details on these procedures.  Patient had a cath in [place] for [place] with inferior ischemia in 2005.  This showed moderate LCx and LAD disease and severe diffuse RCA disease.  He was managed medically. Interestingly, he does not remember this cath at all.   Past Medical History  Diagnosis Date  . Asthma   . Coronary artery disease 1991    Angioplasty LAD 1991 [place]  . GERD (gastroesophageal reflux disease)   . Hyperlipidemia   . Myocardial infarct   . Nephrolithiasis   . Osteopenia   . HTN (hypertension)   . Diabetes mellitus   . Hearing loss   . MI (myocardial infarction) 1995    [facility] in [place], med Rx    Surgical History:  Past Surgical  History  Procedure Date  . Inguinal hernia repair   . Ptca   . Vasectomy   . Orif tibia & fibula fractures   . Angioplasty 1991  . Eye surgery 2010    cataract - right  . Cholecystectomy 2012     I have reviewed the patient's current medications. Medication Sig  albuterol (PROVENTIL HFA;VENTOLIN HFA) 108 (90 BASE) MCG/ACT inhaler Inhale 2 puffs into the lungs every 4 (four) hours as needed. For shortness of breath  aspirin 81 MG EC tablet Take 1 tablet by mouth daily. Swallow whole.  atenolol (TENORMIN) 50 MG tablet Take 50 mg by mouth 2 (two) times daily.  Cholecalciferol (VITAMIN D) 2000 UNITS CAPS Take 2,000 Units by mouth daily.   gabapentin (NEURONTIN) 300 MG capsule Take 300 mg by mouth at bedtime.  Hyoscyamine Sulfate  0.375 MG TBCR Take 1-2 tablets by mouth daily as needed. For irritable bowel syndrome  levothyroxine (LEVOTHROID) 25 MCG tablet Take 1 tablet (25 mcg total) by mouth daily.  metFORMIN (GLUCOPHAGE) 500 MG tablet Take 500 mg by mouth 2  times daily with a meal.    Multiple Vitamin (MULTIVITAMIN) tablet Take 1 tablet by mouth daily.    nitroGLYCERIN (NITROSTAT) 0.4 MG SL tablet Place 0.4 mg under the tongue every 5 (five) minutes as needed. Chest pain  pantoprazole (PROTONIX) 40 MG tablet Take 40  mg by mouth daily. Take one half hour before eating.   ramipril (ALTACE) 5 MG capsule Take 1 capsule (5 mg total) by mouth daily.  simvastatin (ZOCOR) 80 MG tablet Take 40 mg by mouth at bedtime.   glucose blood (TEST STRIPS) test strip 1 each by Other route daily. Use daily  nortriptyline (PAMELOR) 25 MG capsule Take 1 capsule (25 mg total) by mouth at bedtime.  traMADol (ULTRAM) 50 MG tablet Take 1 tablet (50 mg total) by mouth every 8 (eight) hours as needed for pain.   Scheduled Meds:   . ondansetron       Allergies:  Allergies  Allergen Reactions  . Meloxicam Other (See Comments)    Severe gastritis   History   Social History  . Marital Status: Married     Spouse Name: N/A    Number of Children: N/A  . Years of Education: N/A   Occupational History  . Retired   Social History Main Topics  . Smoking status: Never Smoker   . Smokeless tobacco: Never Used  . Alcohol Use: No  . Drug Use: No  . Sexually Active: Not on file   Family History  Problem Relation Age of Onset  . Asthma Mother   . Allergies Sister   . Coronary artery disease/MI Brother  late 5s, early 60s   Family Status  Relation Status Death Age  . Mother Deceased 38    No known CAD  . Father Deceased 70    CAD and renal disease    Review of Systems: Occasional MS pain. IBS is constipation and occasional diarrhea. No recent illnesses, fevers or chills. He uses an inhaler occasionally for SOB. No current wheezing/cough. Full 14-point review of systems otherwise negative except as noted above.  Physical Exam: VS: Blood pressure 139/75, heart rate 96, RR 19, O2 sats 99% on 2 liters General: Well developed, well nourished, male in no acute distress. Head: Normocephalic, atraumatic, sclera non-icteric, no xanthomas, nares are without discharge. Dentition: poor  Neck: No carotid bruits. JVD not elevated. No thyromegally Lungs: Good expansion bilaterally. without wheezes or rhonchi. Few Rales bases Heart: Regular rate and rhythm with S1 S2.  No S3 or S4.  No murmur, no rubs, or gallops appreciated. Abdomen: Soft, tender bilateral lower quadrants, non-distended with normoactive bowel sounds. No hepatomegaly. No rebound/guarding. No obvious abdominal masses. Msk:  Strength and tone appear normal for age. No joint deformities or effusions, no spine or costo-vertebral angle tenderness. Extremities: No clubbing or cyanosis. No edema.  Distal pedal pulses are 2+ in 4 extrem Neuro: Alert and oriented X 3. Moves all extremities spontaneously. No focal deficits noted. Psych:  Responds to questions appropriately with a normal affect. Skin: No rashes or lesions noted  Labs:  Lab  Results  Component Value Date   WBC 8.8 01/27/2012   HGB 12.6* 01/27/2012   HCT 37.0* 01/27/2012   MCV 100.5* 01/27/2012   PLT 255 01/27/2012   No results found for this basename: INR in the last 72 hours  Lab 01/27/12 1521 01/27/12 1506  NA 140 --  K 4.6 --  CL 104 --  CO2 -- --  BUN 18 --  CREATININE 1.10 --  CALCIUM -- --  PROT -- 6.8  BILITOT -- 0.4  ALKPHOS -- 72  ALT -- 19  AST -- 18  GLUCOSE 140* --    Basename 01/27/12 1519  TROPIPOC 0.00   Radiology/Studies:  Dg Chest 2 View 01/27/2012  *RADIOLOGY REPORT*  Clinical Data: Chest pain, fatigue.  CHEST - 2 VIEW  Comparison: 02/22/2011  Findings: Scarring in the lung bases.  No acute opacities or effusions.  Heart is normal size.  No acute bony abnormality.  IMPRESSION: Bibasilar scarring.   Original Report Authenticated By: Cyndie Chime, M.D.      Cardiac Cath: 05/27/2004 The patient and his wife deny he had this procedure.  INDICATIONS: Evaluate patient with Cardiolite suggesting inferior ischemia.  He previously had known coronary disease. He has had a couple of  interventions in [place]. Apparently, he had one in 54 in [place]. He  has had intervention as far as I can tell on mid and distal right coronary  lesions on separate occasions. His last catheterization in 1997  demonstrated recannulized right coronary that was managed medically.  The left main was normal.  The LAD had proximal long 40% stenosis bridging a small first diagonal and a  large second diagonal. The distal vessel appeared to wrap the apex, but was  small in caliber and seemed to have some diffuse disease. The second  diagonal was a large vessel with an ostial 50-60% stenosis seen in the RAO  cranial and RAO views.  Circumflex had mid 25% stenosis in the AV groove. The distal AV groove  vessel was very small. There was a large first obtuse marginal which had  long proximal and mid 70% stenosis. A second obtuse marginal came off right  below  the first obtuse marginal and was a long, but narrow vessel with  proximal and mid 70% stenosis.  The right coronary artery was a large dominant vessel. There was a long  proximal 50% stenosis and diffuse luminal irregularities in the proximal  segment. There was mid 99% stenosis as was noted in 1997. There was a long  40-30% stenosis before the PDA and 40% stenosis after the PDA before two  posterolaterals. PDA was narrow caliber vessel, but long. Had a mid  diffuse 90-95% stenosis. There were two small posterolaterals after which  were free of high grade disease.  LEFT VENTRICULOGRAM: A left ventriculogram was obtained in the RAO  projection. The EF was 65% with well preserved wall motion.  PLAN: I reviewed this very extensively with the patient and his wife. At  this point we can consider three options. Bypass would be an option, though  I do not think the disease has progressed dramatically and he is not having  any obvious unstable symptoms. His Cardiolite suggested no high risk  features in the LAD circumflex distribution. Consideration could be given  to angioplasty and stent of the worst lesion in this situation, this is the  right coronary. However, this has not changed since 1997 by description.  At this point I favor very aggressive medical management. Toward that end,  he needs aggressive lifestyle changes with diet and exercise. He needs high  dose of Statins with very low lipids as a goal. I would put him on aspirin  and Plavix. I will increase his beta blocker. We would reconsider this  strategy with any symptoms. I probably would follow him with stress  perfusion imaging understanding that the inferior wall will always be  ischemic.  ECG: 27-Jan-2012 14:22:07   SINUS RHYTHM ~ normal P axis, V-rate 50- 99 BASELINE WANDER IN LEAD(S) V6 Standard 12 Lead Report ~ Unconfirmed Interpretation Normal ECG Vent. rate 89 BPM PR interval 204 ms QRS duration 94 ms QT/QTc 356/433  ms P-R-T axes 43 -9 7  ASSESSMENT AND PLAN:    Melida Quitter PA-C 01/27/2012, 5:24 PM  Patient seen with PA, agree with the above note.  Patient has a history of chronic stable angina (chest pain walking up hills).  He has known moderate LCx and LAD disease along with severe diffuse RCA disease from cath in 2005.  Over the last few weeks, he has noted increased fatigue with exertion and had been set up for myoview yesterday by his PCP.  However, today he had a squeezing chest pain radiating from his back.  This lasted about 2 hours (waxing and waning) and resolved in the ER.  ECG was non-acute and 1st cardiac enzymes negative.   1. CAD: Known CAD, as described above.  He had an abnormal myoview prior to cath in 2005 with medical management at that time.  Symptoms have been stable for years, but current presentation is concerning for unstable angina.  Cannot fully rule out a GI etiology.  He does have IBS. He has had a cholecystectomy.  I suspect that a repeat stress test would be abnormal regardless (as last one was as well), so I think that the best choice in this situation is going to be a repeat cardiac catheterization even if he rules out for MI.  - Cycle cardiac enzymes, ECGs - Heparin gtt, continue beta blocker/statin/aspirin/ACEI.  - check lipids in am.  2. Diabetes: Hold metformin for cath.  Cover with sliding scale insulin.   Marca Ancona 01/27/2012 6:23 PM

## 2012-01-27 NOTE — ED Provider Notes (Signed)
History     CSN: [id number]  Arrival date & time 01/27/12  1412   First MD Initiated Contact with Patient 01/27/12 1435      Chief Complaint  Patient presents with  . Chest Pain    (Consider location/radiation/quality/duration/timing/severity/associated sxs/prior treatment) Patient is a 71 y.o. male presenting with chest pain.  Chest Pain The chest pain began 3 - 5 days ago. Chest pain occurs intermittently. The chest pain is worsening. The pain is currently at 1/10. The quality of the pain is described as squeezing. The pain does not radiate. Primary symptoms include shortness of breath, abdominal pain (chronic - not worse), nausea and vomiting. Pertinent negatives for primary symptoms include no fever and no cough.  Associated symptoms include diaphoresis.  Pertinent negatives for associated symptoms include no lower extremity edema and no numbness. He tried nothing for the symptoms.     Past Medical History  Diagnosis Date  . Asthma   . Coronary artery disease   . GERD (gastroesophageal reflux disease)   . Hyperlipidemia   . Myocardial infarct   . Nephrolithiasis   . Osteopenia   . HTN (hypertension)   . Diabetes mellitus   . Hearing loss     Past Surgical History  Procedure Date  . Inguinal hernia repair   . Ptca   . Vasectomy   . Orif tibia & fibula fractures   . Angioplasty 1991  . Eye surgery 2010    cataract - right  . Cholecystectomy 2012    Family History  Problem Relation Age of Onset  . Asthma Mother   . Allergies Sister   . Coronary artery disease Other     History  Substance Use Topics  . Smoking status: Never Smoker   . Smokeless tobacco: Never Used  . Alcohol Use: No      Review of Systems  Constitutional: Positive for diaphoresis. Negative for fever and chills.  HENT: Negative for neck pain.   Respiratory: Positive for shortness of breath. Negative for cough.   Cardiovascular: Positive for chest pain. Negative for leg swelling.    Gastrointestinal: Positive for nausea, vomiting and abdominal pain (chronic - not worse). Negative for constipation and blood in stool.  Genitourinary: Negative for dysuria.  Musculoskeletal: Positive for back pain.  Neurological: Negative for numbness and headaches.  Psychiatric/Behavioral: Negative.   All other systems reviewed and are negative.    Allergies  Meloxicam  Home Medications   Current Outpatient Rx  Name Route Sig Dispense Refill  . ALBUTEROL SULFATE HFA 108 (90 BASE) MCG/ACT IN AERS Inhalation Inhale 2 puffs into the lungs every 4 (four) hours as needed.      . ASPIRIN 81 MG PO TBEC Oral Take 1 tablet (81 mg total) by mouth daily. Swallow whole. 30 tablet 12  . ATENOLOL 100 MG PO TABS  take 1/2 tablet by mouth twice a day 30 tablet 5  . ATENOLOL 50 MG PO TABS  1 tablet twice daily 180 tablet 3  . VITAMIN D 2000 UNITS PO CAPS Oral Take by mouth daily.    Marland Kitchen GABAPENTIN 600 MG PO TABS Oral Take 600 mg by mouth 2 (two) times daily.    Marland Kitchen GLUCOSE BLOOD VI STRP Other 1 each by Other route daily. Use daily 100 each 6    Dx 250.00  . HYOSCYAMINE SULFATE ER 0.375 MG PO TBCR Oral Take by mouth as needed.      Marland Kitchen LEVOTHYROXINE SODIUM 25 MCG PO TABS Oral Take  1 tablet (25 mcg total) by mouth daily. 90 tablet 6  . METFORMIN HCL 500 MG PO TABS Oral Take 500 mg by mouth 2 (two) times daily with a meal.      . ONE-DAILY MULTI VITAMINS PO TABS Oral Take 1 tablet by mouth daily.      Marland Kitchen NITROGLYCERIN 0.4 MG SL SUBL Sublingual Place 0.4 mg under the tongue daily.      Marland Kitchen NORTRIPTYLINE HCL 25 MG PO CAPS Oral Take 1 capsule (25 mg total) by mouth at bedtime. 90 capsule 4  . PANTOPRAZOLE SODIUM 40 MG PO TBEC Oral Take 40 mg by mouth daily. Take one half hour before eating.     Marland Kitchen RAMIPRIL 5 MG PO CAPS Oral Take 1 capsule (5 mg total) by mouth daily. 90 capsule 3  . SIMVASTATIN 80 MG PO TABS Oral Take 80 mg by mouth. Take 1/2 at bedtime.     . TRAMADOL HCL 50 MG PO TABS Oral Take 1 tablet (50 mg  total) by mouth every 8 (eight) hours as needed for pain. 30 tablet 0    BP 139/75  Physical Exam  Nursing note and vitals reviewed. Constitutional: He is oriented to person, place, and time. He appears well-developed and well-nourished.  HENT:  Head: Normocephalic and atraumatic.  Right Ear: External ear normal.  Left Ear: External ear normal.  Nose: Nose normal.  Neck: Neck supple.  Cardiovascular: Normal rate, regular rhythm, normal heart sounds and intact distal pulses.   No murmur heard. Pulmonary/Chest: Effort normal and breath sounds normal. He has no wheezes. He has no rales.  Abdominal: Soft. He exhibits no distension. There is tenderness (mild) in the epigastric area.  Musculoskeletal: He exhibits no edema and no tenderness.  Neurological: He is alert and oriented to person, place, and time.  Skin: Skin is warm and dry.    ED Course  Procedures (including critical care time)  Labs Reviewed  CBC WITH DIFFERENTIAL - Abnormal; Notable for the following:    RBC 3.79 (*)     Hemoglobin 12.9 (*)     HCT 38.1 (*)     MCV 100.5 (*)     Neutrophils Relative 84 (*)     All other components within normal limits  POCT I-STAT, CHEM 8 - Abnormal; Notable for the following:    Glucose, Bld 140 (*)     Calcium, Ion 1.31 (*)     Hemoglobin 12.6 (*)     HCT 37.0 (*)     All other components within normal limits  HEPATIC FUNCTION PANEL  LIPASE, BLOOD  POCT I-STAT TROPONIN I   Dg Chest 2 View  01/27/2012  *RADIOLOGY REPORT*  Clinical Data: Chest pain, fatigue.  CHEST - 2 VIEW  Comparison: 02/22/2011  Findings: Scarring in the lung bases.  No acute opacities or effusions.  Heart is normal size.  No acute bony abnormality.  IMPRESSION: Bibasilar scarring.   Original Report Authenticated By: Cyndie Chime, M.D.      Date: 01/27/2012  Rate: 89  Rhythm: normal sinus rhythm  QRS Axis: left  Intervals: normal  ST/T Wave abnormalities: normal  Conduction Disutrbances:none   Narrative Interpretation:   Old EKG Reviewed: unchanged   1. Chest pain       MDM  71 yo male with CAD with intermittent squeezing chest pain over past couple days, worst today. Pain nearly resolved now, as well as dyspnea. Also had nausea, diaphoresis. C/o some mid thoracic back pain as  well, also nearly resolved. No ripping or tearing pain, and pain almost resolved without treatment. Appears comfortable, CXR neg, doubt dissection. Doubt PE as well. Concern for ACS. EKG unchanged. Cardiology consulted, will see in ED. Care transferred to Dr. Christain Sacramento with cards c/w pending.        Pricilla Loveless, MD 01/27/12 [phone]

## 2012-01-27 NOTE — Progress Notes (Signed)
ANTICOAGULATION CONSULT NOTE - Initial Consult  Pharmacy Consult for heparin Indication: chest pain/ACS  Allergies  Allergen Reactions  . Meloxicam Other (See Comments)    Severe gastritis    Patient Measurements: Height: 5\' 11"  (180.3 cm) Weight: 216 lb 4.3 oz (98.1 kg) IBW/kg (Calculated) : 75.3  Heparin Dosing Weight: 95.3 kg  Vital Signs: Temp: 98 F (36.7 C) (08/20 2105) Temp src: Oral (08/20 2105) BP: 156/84 mmHg (08/20 2105) Pulse Rate: 80  (08/20 2105)  Labs:  Basename 01/27/12 1521 01/27/12 1506  HGB 12.6* 12.9*  HCT 37.0* 38.1*  PLT -- 255  APTT -- --  LABPROT -- --  INR -- --  HEPARINUNFRC -- --  CREATININE 1.10 --  CKTOTAL -- --  CKMB -- --  TROPONINI -- --    Estimated Creatinine Clearance: 73.5 ml/min (by C-G formula based on Cr of 1.1).   Medical History: Past Medical History  Diagnosis Date  . Asthma   . Coronary artery disease 1991    Angioplasty LAD 1991 [place]  . GERD (gastroesophageal reflux disease)   . Hyperlipidemia   . Myocardial infarct   . Nephrolithiasis   . Osteopenia   . HTN (hypertension)   . Diabetes mellitus   . Hearing loss   . MI (myocardial infarction) 1995    [facility] in [place], med Rx    Medications:  Scheduled:    . aspirin  324 mg Oral Pre-Cath  . aspirin EC  81 mg Oral Daily  . atenolol  50 mg Oral BID  . atorvastatin  40 mg Oral q1800  . cholecalciferol  2,000 Units Oral Daily  . diazepam  5 mg Oral On Call  . gabapentin  300 mg Oral QHS  . levothyroxine  25 mcg Oral Q breakfast  . multivitamin  1 tablet Oral Daily  . nortriptyline  25 mg Oral QHS  . ondansetron      . pantoprazole  40 mg Oral Daily  . ramipril  5 mg Oral Daily  . sodium chloride  3 mL Intravenous Q12H  . DISCONTD: aspirin  81 mg Oral Daily  . DISCONTD: Vitamin D  2,000 Units Oral Daily   Infusions:    . sodium chloride    . nitroGLYCERIN      Assessment: 71 yo male with chest pain will be started on heparin  therapy.  Baseline CBC okay.  No prior oral anticoagulants. Goal of Therapy:  Heparin level 0.3-0.7 units/ml Monitor platelets by anticoagulation protocol: Yes   Plan:  1) Heparin 4000 units iv bolus x1, then start heparin drip at 1250 units/hr 2) 8 hour heparin level. 3) Daily heparin level and CBC  Josua Ferrebee, Tsz-Yin 01/27/2012,9:07 PM

## 2012-01-28 ENCOUNTER — Encounter (HOSPITAL_COMMUNITY)
Admission: EM | Disposition: A | Discharge status: Home / Self Care | Payer: Self-pay | Source: Home / Self Care | Attending: Cardiology

## 2012-01-28 DIAGNOSIS — I2510 Atherosclerotic heart disease of native coronary artery without angina pectoris: Secondary | ICD-10-CM

## 2012-01-28 HISTORY — PX: LEFT HEART CATHETERIZATION WITH CORONARY ANGIOGRAM: SHX5451

## 2012-01-28 LAB — LIPID PANEL
HDL: 43 mg/dL (ref >39)
Total CHOL/HDL Ratio: 3.3 RATIO
Triglycerides: 163 mg/dL — ABNORMAL HIGH (ref <150)

## 2012-01-28 LAB — COMPREHENSIVE METABOLIC PANEL
ALT: 17 U/L (ref 0–53)
Albumin: 3.4 g/dL — ABNORMAL LOW (ref 3.5–5.2)
Alkaline Phosphatase: 67 U/L (ref 39–117)
Calcium: 9.7 mg/dL (ref 8.4–10.5)
Potassium: 4.2 mEq/L (ref 3.5–5.1)
Sodium: 138 mEq/L (ref 135–145)
Total Protein: 6.3 g/dL (ref 6.0–8.3)

## 2012-01-28 LAB — HEMOGLOBIN A1C: Hgb A1c MFr Bld: 6.4 % — ABNORMAL HIGH (ref <5.7)

## 2012-01-28 LAB — GLUCOSE, CAPILLARY
Glucose-Capillary: 75 mg/dL (ref 70–99)
Glucose-Capillary: 78 mg/dL (ref 70–99)

## 2012-01-28 LAB — CBC
HCT: 34.6 % — ABNORMAL LOW (ref 39.0–52.0)
MCV: 98.9 fL (ref 78.0–100.0)
Platelets: 223 10*3/uL (ref 150–400)
RBC: 3.5 MIL/uL — ABNORMAL LOW (ref 4.22–5.81)
RDW: 13.6 % (ref 11.5–15.5)
WBC: 9 10*3/uL (ref 4.0–10.5)

## 2012-01-28 LAB — HEPARIN LEVEL (UNFRACTIONATED)
Heparin Unfractionated: 0.28 IU/mL — ABNORMAL LOW (ref 0.30–0.70)
Heparin Unfractionated: 0.33 IU/mL (ref 0.30–0.70)

## 2012-01-28 LAB — CARDIAC PANEL(CRET KIN+CKTOT+MB+TROPI)
CK, MB: 2.6 ng/mL (ref 0.3–4.0)
Total CK: 84 U/L (ref 7–232)
Troponin I: <0.3 ng/mL (ref <0.30)

## 2012-01-28 SURGERY — LEFT HEART CATHETERIZATION WITH CORONARY ANGIOGRAM
Anesthesia: LOCAL

## 2012-01-28 MED ORDER — ONDANSETRON HCL 4 MG/2ML IJ SOLN: 4.0000 mg | Freq: Four times a day (QID) | INTRAMUSCULAR | Status: DC | PRN

## 2012-01-28 MED ORDER — MIDAZOLAM HCL 2 MG/2ML IJ SOLN
INTRAMUSCULAR | Status: AC
  Filled 2012-01-28: qty 2

## 2012-01-28 MED ORDER — HEPARIN (PORCINE) IN NACL 2-0.9 UNIT/ML-% IJ SOLN
INTRAMUSCULAR | Status: AC
  Filled 2012-01-28: qty 2000

## 2012-01-28 MED ORDER — LIDOCAINE HCL (PF) 1 % IJ SOLN
INTRAMUSCULAR | Status: AC
  Filled 2012-01-28: qty 30

## 2012-01-28 MED ORDER — HEPARIN BOLUS VIA INFUSION
1500.0000 [IU] | Freq: Once | INTRAVENOUS | Status: AC
  Administered 2012-01-28: 1500 [IU] via INTRAVENOUS
  Filled 2012-01-28: qty 1500

## 2012-01-28 MED ORDER — NITROGLYCERIN 0.2 MG/ML ON CALL CATH LAB
INTRAVENOUS | Status: AC
  Filled 2012-01-28: qty 1

## 2012-01-28 MED ORDER — FENTANYL CITRATE 0.05 MG/ML IJ SOLN
INTRAMUSCULAR | Status: AC
  Filled 2012-01-28: qty 2

## 2012-01-28 MED ORDER — SODIUM CHLORIDE 0.9 % IV SOLN
INTRAVENOUS | Status: AC
  Administered 2012-01-28: 20:00:00 via INTRAVENOUS

## 2012-01-28 MED ORDER — ACETAMINOPHEN 325 MG PO TABS: 650.0000 mg | ORAL_TABLET | ORAL | Status: DC | PRN

## 2012-01-28 NOTE — CV Procedure (Signed)
    Cardiac Cath Note  Richard Ho [id number] 1940-07-27  Procedure: left  Heart Cardiac Catheterization Note Indications: chest pain  Procedure Details Consent: Obtained Time Out: Verified patient identification, verified procedure, site/side was marked, verified correct patient position, special equipment/implants available, Radiology Safety Procedures followed,  medications/allergies/relevent history reviewed, required imaging and test results available.  Performed   Medications: Fentanyl: 50 mcg IV Versed: 2 mg IV  The right femoral artery was easily canulated using a modified Seldinger technique.  Hemodynamics:   LV pressure: 142/5 Aortic pressure: 143/77  Angiography   Left Main: normal  Left anterior Descending: Proximal LAD has a 50% stenosis.  The distal LAD has minor luminal irregularities.  The 1st diagonal has a 30-40% stenosis at the take off.  Left Circumflex:  Large vessel that trifurcates quickly into 3 branches.  The 1st OM is severely diseased and is subtotally occluded.  The OM 2 is small and has a mid 80% stenosis.  The continuation branch is small and provides collateral flow the the distal RCA.  The OM1 is tighter than his cath in 2005.   Right Coronary Artery: large and dominant.  There is a long stenosis in the proximal and mid segments that ranges from 60% stenosis in the prox region and is 95% stenosed in the mid region.  There are mild - moderate diffuse in the distal region.  The PDA is severely diseased in the mid segment.  The distal PLSA is occluded and fills via Left to right collaterals.  The entire RCA is basically unchanged from his previous cath in 2005.    LV Gram: Normal LV function.  Inferior lateral basal akinesis.    Complications: No apparent complications Patient did tolerate procedure well.  Contrast used: 70 cc   Conclusions:   1. Severe CAD involving primarily theh LCx and RCA.  The LAD has moderate disease.  There is not  much to offer with PCI.  Our best option is likely medical therapy.  His coronary anatomy has not changed much over the past 8 years.  The OM1 is tighter but that vessel is not a very good candidate for PCI.  The RCA is a much larger vessel and is very severe but is unchanged for the past 8 years - I'm not sure that it needs revascularization  Continue medical therapy.  Richard Ho, Richard Hageman., MD, [facility] 01/28/2012, 7:04 PM Office - [phone] Pager [phone]

## 2012-01-28 NOTE — Interval H&P Note (Signed)
History and Physical Interval Note:  01/28/2012 6:22 PM  Richard Ho  has presented today for surgery, with the diagnosis of cp  The various methods of treatment have been discussed with the patient and family. After consideration of risks, benefits and other options for treatment, the patient has consented to  Procedure(s) (LRB): LEFT HEART CATHETERIZATION WITH CORONARY ANGIOGRAM (N/A) as a surgical intervention .  The patient's history has been reviewed, patient examined, no change in status, stable for surgery.  I have reviewed the patient's chart and labs.  Questions were answered to the patient's satisfaction.     Elyn Aquas.

## 2012-01-28 NOTE — Progress Notes (Signed)
   SUBJECTIVE: He is chest pain-free this morning. Still on heparin and nitroglycerin.   Filed Vitals:   01/27/12 1925 01/27/12 2052 01/27/12 2105 01/28/12 0605  BP: 141/80  156/84 122/75  Pulse: 86  80 75  Temp:   98 F (36.7 C) 97.4 F (36.3 C)  TempSrc:   Oral Oral  Resp: 19  18 18   Height:  5\' 11"  (1.803 m)    Weight:  98.1 kg (216 lb 4.3 oz)  98.158 kg (216 lb 6.4 oz)  SpO2: 99%  97% 94%    Intake/Output Summary (Last 24 hours) at 01/28/12 1025 Last data filed at 01/28/12 9604  Gross per 24 hour  Intake      0 ml  Output    400 ml  Net   -400 ml    LABS: Basic Metabolic Panel:  Basename 01/28/12 0233 01/27/12 1521  NA 138 140  K 4.2 4.6  CL 103 104  CO2 27 --  GLUCOSE 97 140*  BUN 17 18  CREATININE 1.18 1.10  CALCIUM 9.7 --  MG -- --  PHOS -- --   Liver Function Tests:  Basename 01/28/12 0233 01/27/12 1506  AST 16 18  ALT 17 19  ALKPHOS 67 72  BILITOT 0.5 0.4  PROT 6.3 6.8  ALBUMIN 3.4* 3.7    Basename 01/27/12 1506  LIPASE 52  AMYLASE --   CBC:  Basename 01/28/12 0233 01/27/12 1521 01/27/12 1506  WBC 9.0 -- 8.8  NEUTROABS -- -- 7.3  HGB 11.5* 12.6* --  HCT 34.6* 37.0* --  MCV 98.9 -- 100.5*  PLT 223 -- 255   Cardiac Enzymes:  Basename 01/28/12 0856 01/28/12 0232 01/27/12 2125  CKTOTAL 81 84 95  CKMB 2.6 2.6 2.8  CKMBINDEX -- -- --  TROPONINI <0.30 <0.30 <0.30   BNP: No components found with this basename: POCBNP:3 D-Dimer: No results found for this basename: DDIMER:2 in the last 72 hours Hemoglobin A1C:  Basename 01/27/12 2123  HGBA1C 6.4*   Fasting Lipid Panel:  Basename 01/28/12 0233  CHOL 140  HDL 43  LDLCALC 64  TRIG 163*  CHOLHDL 3.3  LDLDIRECT --   Thyroid Function Tests:  Basename 01/27/12 2123  TSH 2.255  T4TOTAL --  T3FREE --  THYROIDAB --   Anemia Panel: No results found for this basename: VITAMINB12,FOLATE,FERRITIN,TIBC,IRON,RETICCTPCT in the last 72 hours   PHYSICAL EXAM General: Well  developed, well nourished, in no acute distress HEENT:  Normocephalic and atramatic Neck:  No JVD.  Lungs: Clear bilaterally to auscultation and percussion. Heart: HRRR . Normal S1 and S2 without gallops or murmurs.  Abdomen: Bowel sounds are positive, abdomen soft and non-tender  Msk:  Back normal, normal gait. Normal strength and tone for age. Extremities: No clubbing, cyanosis or edema.   Neuro: Alert and oriented X 3. Psych:  Good affect, responds appropriately  TELEMETRY: Reviewed telemetry pt in normal sinus rhythm  ASSESSMENT AND PLAN: 1. unstable angina: Known CAD without previous revascularization. He had an abnormal myoview prior to cath in 2005 with medical management at that time. Symptoms have been stable for years, but current presentation is concerning for unstable angina. Cannot fully rule out a GI etiology. He does have IBS. He has had a cholecystectomy.I agree with cardiac.  - Heparin gtt, continue beta blocker/statin/aspirin/ACEI.  2. Diabetes: Hold metformin for cath. Cover with sliding scale insulin.    Lorine Bears, MD, [facility] 01/28/2012 10:25 AM

## 2012-01-28 NOTE — Progress Notes (Signed)
ANTICOAGULATION CONSULT NOTE - Follow Up Consult  Pharmacy Consult for heparin Indication: chest pain/ACS  Allergies  Allergen Reactions  . Meloxicam Other (See Comments)    Severe gastritis    Patient Measurements: Height: 5\' 11"  (180.3 cm) Weight: 216 lb 4.3 oz (98.1 kg) IBW/kg (Calculated) : 75.3  Heparin Dosing Weight: 95.3 kg  Vital Signs: Temp: 98 F (36.7 C) (08/20 2105) Temp src: Oral (08/20 2105) BP: 156/84 mmHg (08/20 2105) Pulse Rate: 80  (08/20 2105)  Labs:  Basename 01/28/12 0233 01/27/12 2125 01/27/12 2123 01/27/12 1521 01/27/12 1506  HGB 11.5* -- -- 12.6* --  HCT 34.6* -- -- 37.0* 38.1*  PLT 223 -- -- -- 255  APTT -- -- 29 -- --  LABPROT -- -- 13.0 -- --  INR -- -- 0.96 -- --  HEPARINUNFRC 0.28* -- -- -- --  CREATININE -- -- -- 1.10 --  CKTOTAL -- 95 -- -- --  CKMB -- 2.8 -- -- --  TROPONINI -- <0.30 -- -- --    Estimated Creatinine Clearance: 73.5 ml/min (by C-G formula based on Cr of 1.1).   Medical History: Past Medical History  Diagnosis Date  . Asthma   . Coronary artery disease 1991    Angioplasty LAD 1991 [place]  . GERD (gastroesophageal reflux disease)   . Hyperlipidemia   . Myocardial infarct   . Nephrolithiasis   . Osteopenia   . HTN (hypertension)   . Diabetes mellitus   . Hearing loss   . MI (myocardial infarction) 1995    [facility] [place], med Rx    Medications:  Scheduled:     . aspirin  324 mg Oral Pre-Cath  . aspirin EC  81 mg Oral Daily  . atenolol  50 mg Oral BID  . atorvastatin  40 mg Oral q1800  . cholecalciferol  2,000 Units Oral Daily  . diazepam  5 mg Oral On Call  . gabapentin  300 mg Oral QHS  . heparin  4,000 Units Intravenous Once  . levothyroxine  25 mcg Oral Q breakfast  . multivitamin with minerals  1 tablet Oral Daily  . nortriptyline  25 mg Oral QHS  . ondansetron      . pantoprazole  40 mg Oral Daily  . ramipril  5 mg Oral Daily  . sodium chloride  3 mL Intravenous Q12H  .  DISCONTD: aspirin  81 mg Oral Daily  . DISCONTD: diazepam  5 mg Oral On Call  . DISCONTD: multivitamin  1 tablet Oral Daily  . DISCONTD: Vitamin D  2,000 Units Oral Daily   Infusions:     . sodium chloride    . heparin 1,250 Units/hr (01/27/12 2246)  . nitroGLYCERIN 10 mcg/min (01/27/12 2251)  . DISCONTD: nitroGLYCERIN      Assessment: 71 yo male with chest pain on heparin therapy. Heparin level (0.28) is below-goal on 1250 units/hr. No problems with line / infusion per RN  Goal of Therapy:  Heparin level 0.3-0.7 units/ml Monitor platelets by anticoagulation protocol: Yes   Plan:  1. Heparin IV bolus 1500 units x 1, then increase IV heparin to 1450 units/hr. 2. Heparin level in 8 hours.   Emeline Gins 01/28/2012,3:39 AM

## 2012-01-28 NOTE — Progress Notes (Signed)
ANTICOAGULATION CONSULT NOTE - Follow Up Consult  Pharmacy Consult for heparin Indication: chest pain/ACS  Allergies  Allergen Reactions  . Meloxicam Other (See Comments)    Severe gastritis    Patient Measurements: Height: 5\' 11"  (180.3 cm) Weight: 216 lb 6.4 oz (98.158 kg) IBW/kg (Calculated) : 75.3    Vital Signs: Temp: 97.4 F (36.3 C) (08/21 0605) Temp src: Oral (08/21 0605) BP: 122/75 mmHg (08/21 0605) Pulse Rate: 75  (08/21 0605)  Labs:  Alvira Philips 01/28/12 1201 01/28/12 0856 01/28/12 0233 01/28/12 0232 01/27/12 2125 01/27/12 2123 01/27/12 1521 01/27/12 1506  HGB -- -- 11.5* -- -- -- 12.6* --  HCT -- -- 34.6* -- -- -- 37.0* 38.1*  PLT -- -- 223 -- -- -- -- 255  APTT -- -- -- -- -- 29 -- --  LABPROT -- -- -- -- -- 13.0 -- --  INR -- -- -- -- -- 0.96 -- --  HEPARINUNFRC 0.33 -- 0.28* -- -- -- -- --  CREATININE -- -- 1.18 -- -- -- 1.10 --  CKTOTAL -- 81 -- 84 95 -- -- --  CKMB -- 2.6 -- 2.6 2.8 -- -- --  TROPONINI -- <0.30 -- <0.30 <0.30 -- -- --    Estimated Creatinine Clearance: 68.6 ml/min (by C-G formula based on Cr of 1.18).  Assessment: Patient is a 71 y.o M on heparin for CP with plan for cardiac cath.  Heparin level now back therapeutic.  No bleeding noted.  Goal of Therapy:  Heparin level 0.3-0.7 units/ml Monitor platelets by anticoagulation protocol: Yes   Plan:  1) continue current heparin regimen 2) f/u with patient after cath  Shankar Silber P 01/28/2012,12:59 PM

## 2012-01-28 NOTE — Care Management Note (Unsigned)
    Page 1 of 1   01/28/2012     1:30:26 PM   CARE MANAGEMENT NOTE 01/28/2012  Patient:  Richard Ho, Richard Ho   Account Number:  [id number]  Date Initiated:  01/28/2012  Documentation initiated by:  Nakira Litzau  Subjective/Objective Assessment:   PT WITH HX OF CAD ADM WITH CHEST PAIN ON 01/27/12.  PTA, PT INDEPENDENT, LIVES WITH SPOUSE.     Action/Plan:   MET WITH PT AND WIFE; AWAITING CATH LATER TODAY.  WIFE TO PROVIDE CARE AT DC.  WILL FOLLOW FOR HOME NEEDS AS PT PROGRESSES.   Anticipated DC Date:  01/29/2012   Anticipated DC Plan:  HOME/SELF CARE      DC Planning Services  CM consult      Choice offered to / List presented to:             Status of service:  In process, will continue to follow Medicare Important Message given?   (If response is "NO", the following Medicare IM given date fields will be blank) Date Medicare IM given:   Date Additional Medicare IM given:    Discharge Disposition:    Per UR Regulation:  Reviewed for med. necessity/level of care/duration of stay  If discussed at Long Length of Stay Meetings, dates discussed:    Comments:

## 2012-01-28 NOTE — H&P (View-Only) (Signed)
   SUBJECTIVE: He is chest pain-free this morning. Still on heparin and nitroglycerin.   Filed Vitals:   01/27/12 1925 01/27/12 2052 01/27/12 2105 01/28/12 0605  BP: 141/80  156/84 122/75  Pulse: 86  80 75  Temp:   98 F (36.7 C) 97.4 F (36.3 C)  TempSrc:   Oral Oral  Resp: 19  18 18  Height:  5' 11" (1.803 m)    Weight:  98.1 kg (216 lb 4.3 oz)  98.158 kg (216 lb 6.4 oz)  SpO2: 99%  97% 94%    Intake/Output Summary (Last 24 hours) at 01/28/12 1025 Last data filed at 01/28/12 0609  Gross per 24 hour  Intake      0 ml  Output    400 ml  Net   -400 ml    LABS: Basic Metabolic Panel:  Basename 01/28/12 0233 01/27/12 1521  NA 138 140  K 4.2 4.6  CL 103 104  CO2 27 --  GLUCOSE 97 140*  BUN 17 18  CREATININE 1.18 1.10  CALCIUM 9.7 --  MG -- --  PHOS -- --   Liver Function Tests:  Basename 01/28/12 0233 01/27/12 1506  AST 16 18  ALT 17 19  ALKPHOS 67 72  BILITOT 0.5 0.4  PROT 6.3 6.8  ALBUMIN 3.4* 3.7    Basename 01/27/12 1506  LIPASE 52  AMYLASE --   CBC:  Basename 01/28/12 0233 01/27/12 1521 01/27/12 1506  WBC 9.0 -- 8.8  NEUTROABS -- -- 7.3  HGB 11.5* 12.6* --  HCT 34.6* 37.0* --  MCV 98.9 -- 100.5*  PLT 223 -- 255   Cardiac Enzymes:  Basename 01/28/12 0856 01/28/12 0232 01/27/12 2125  CKTOTAL 81 84 95  CKMB 2.6 2.6 2.8  CKMBINDEX -- -- --  TROPONINI <0.30 <0.30 <0.30   BNP: No components found with this basename: POCBNP:3 D-Dimer: No results found for this basename: DDIMER:2 in the last 72 hours Hemoglobin A1C:  Basename 01/27/12 2123  HGBA1C 6.4*   Fasting Lipid Panel:  Basename 01/28/12 0233  CHOL 140  HDL 43  LDLCALC 64  TRIG 163*  CHOLHDL 3.3  LDLDIRECT --   Thyroid Function Tests:  Basename 01/27/12 2123  TSH 2.255  T4TOTAL --  T3FREE --  THYROIDAB --   Anemia Panel: No results found for this basename: VITAMINB12,FOLATE,FERRITIN,TIBC,IRON,RETICCTPCT in the last 72 hours   PHYSICAL EXAM General: Well  developed, well nourished, in no acute distress HEENT:  Normocephalic and atramatic Neck:  No JVD.  Lungs: Clear bilaterally to auscultation and percussion. Heart: HRRR . Normal S1 and S2 without gallops or murmurs.  Abdomen: Bowel sounds are positive, abdomen soft and non-tender  Msk:  Back normal, normal gait. Normal strength and tone for age. Extremities: No clubbing, cyanosis or edema.   Neuro: Alert and oriented X 3. Psych:  Good affect, responds appropriately  TELEMETRY: Reviewed telemetry pt in normal sinus rhythm  ASSESSMENT AND PLAN: 1. unstable angina: Known CAD without previous revascularization. He had an abnormal myoview prior to cath in 2005 with medical management at that time. Symptoms have been stable for years, but current presentation is concerning for unstable angina. Cannot fully rule out a GI etiology. He does have IBS. He has had a cholecystectomy.I agree with cardiac.  - Heparin gtt, continue beta blocker/statin/aspirin/ACEI.  2. Diabetes: Hold metformin for cath. Cover with sliding scale insulin.    Anays Detore, MD, FACC 01/28/2012 10:25 AM     

## 2012-01-29 ENCOUNTER — Telehealth: Payer: Self-pay | Admitting: Family Medicine

## 2012-01-29 ENCOUNTER — Encounter (HOSPITAL_COMMUNITY): Payer: Self-pay | Admitting: Physician Assistant

## 2012-01-29 DIAGNOSIS — D539 Nutritional anemia, unspecified: Secondary | ICD-10-CM

## 2012-01-29 LAB — CBC
HCT: 34.6 % — ABNORMAL LOW (ref 39.0–52.0)
Hemoglobin: 11.6 g/dL — ABNORMAL LOW (ref 13.0–17.0)
MCHC: 33.5 g/dL (ref 30.0–36.0)
RBC: 3.45 MIL/uL — ABNORMAL LOW (ref 4.22–5.81)

## 2012-01-29 LAB — BASIC METABOLIC PANEL
CO2: 27 mEq/L (ref 19–32)
Calcium: 9.7 mg/dL (ref 8.4–10.5)
GFR calc non Af Amer: 55 mL/min — ABNORMAL LOW (ref >90)
Sodium: 142 mEq/L (ref 135–145)

## 2012-01-29 LAB — GLUCOSE, CAPILLARY
Glucose-Capillary: 99 mg/dL (ref 70–99)
Glucose-Capillary: 83 mg/dL (ref 70–99)

## 2012-01-29 LAB — POCT ACTIVATED CLOTTING TIME: Activated Clotting Time: 135 seconds

## 2012-01-29 MED ORDER — ISOSORBIDE MONONITRATE ER 30 MG PO TB24
30.0000 mg | ORAL_TABLET | Freq: Every day | ORAL | Status: DC
  Administered 2012-01-29: 30 mg via ORAL
  Filled 2012-01-29: qty 1

## 2012-01-29 MED ORDER — ISOSORBIDE MONONITRATE ER 30 MG PO TB24: 30.0000 mg | ORAL_TABLET | Freq: Every day | ORAL | Status: DC

## 2012-01-29 MED ORDER — NITROGLYCERIN 0.4 MG SL SUBL: 0.4000 mg | SUBLINGUAL_TABLET | SUBLINGUAL | Status: DC | PRN

## 2012-01-29 NOTE — Progress Notes (Signed)
[phone] Pt stated he had already walked several times without CP so I did not walk with pt. Education completed and pt very receptive. Permission given to refer to [facility].  Gave diabetic and heart healthy diets. Juel Ripley DunlapRN

## 2012-01-29 NOTE — Telephone Encounter (Signed)
Rx sent to pharmacy   

## 2012-01-29 NOTE — Telephone Encounter (Signed)
Ok to RF? 

## 2012-01-29 NOTE — ED Provider Notes (Signed)
I saw and evaluated the patient, reviewed the resident's note and I agree with the findings and plan.   Loren Racer, MD 01/29/12 [phone]

## 2012-01-29 NOTE — Progress Notes (Signed)
Patient ID: Richard Ho, male   DOB: [birth year], 71 y.o.   MRN: [id number]    SUBJECTIVE: No chest pain, no complaints.      Marland Kitchen aspirin EC  81 mg Oral Daily  . atenolol  50 mg Oral BID  . atorvastatin  40 mg Oral q1800  . cholecalciferol  2,000 Units Oral Daily  . diazepam  5 mg Oral On Call  . fentaNYL      . gabapentin  300 mg Oral QHS  . heparin      . isosorbide mononitrate  30 mg Oral Daily  . levothyroxine  25 mcg Oral Q breakfast  . lidocaine      . midazolam      . multivitamin with minerals  1 tablet Oral Daily  . nitroGLYCERIN      . nortriptyline  25 mg Oral QHS  . pantoprazole  40 mg Oral Daily  . ramipril  5 mg Oral Daily  . sodium chloride  3 mL Intravenous Q12H      Filed Vitals:   01/28/12 2215 01/28/12 2300 01/29/12 0001 01/29/12 0640  BP: 134/73 148/81 132/75 145/78  Pulse: 70 70 66 68  Temp:    97.3 F (36.3 C)  TempSrc:    Oral  Resp:    18  Height:      Weight:      SpO2:    94%    Intake/Output Summary (Last 24 hours) at 01/29/12 0819 Last data filed at 01/29/12 0400  Gross per 24 hour  Intake      0 ml  Output   2150 ml  Net  -2150 ml    LABS: Basic Metabolic Panel:  Basename 01/28/12 0233 01/27/12 1521  NA 138 140  K 4.2 4.6  CL 103 104  CO2 27 --  GLUCOSE 97 140*  BUN 17 18  CREATININE 1.18 1.10  CALCIUM 9.7 --  MG -- --  PHOS -- --   Liver Function Tests:  Basename 01/28/12 0233 01/27/12 1506  AST 16 18  ALT 17 19  ALKPHOS 67 72  BILITOT 0.5 0.4  PROT 6.3 6.8  ALBUMIN 3.4* 3.7    Basename 01/27/12 1506  LIPASE 52  AMYLASE --   CBC:  Basename 01/29/12 0600 01/28/12 0233 01/27/12 1506  WBC 6.4 9.0 --  NEUTROABS -- -- 7.3  HGB 11.6* 11.5* --  HCT 34.6* 34.6* --  MCV 100.3* 98.9 --  PLT 217 223 --   Cardiac Enzymes:  Basename 01/28/12 0856 01/28/12 0232 01/27/12 2125  CKTOTAL 81 84 95  CKMB 2.6 2.6 2.8  CKMBINDEX -- -- --  TROPONINI <0.30 <0.30 <0.30   BNP: No components found with this  basename: POCBNP:3 D-Dimer: No results found for this basename: DDIMER:2 in the last 72 hours Hemoglobin A1C:  Basename 01/27/12 2123  HGBA1C 6.4*   Fasting Lipid Panel:  Basename 01/28/12 0233  CHOL 140  HDL 43  LDLCALC 64  TRIG 163*  CHOLHDL 3.3  LDLDIRECT --   Thyroid Function Tests:  Basename 01/27/12 2123  TSH 2.255  T4TOTAL --  T3FREE --  THYROIDAB --   Anemia Panel: No results found for this basename: VITAMINB12,FOLATE,FERRITIN,TIBC,IRON,RETICCTPCT in the last 72 hours  RADIOLOGY: Dg Chest 2 View  01/27/2012  *RADIOLOGY REPORT*  Clinical Data: Chest pain, fatigue.  CHEST - 2 VIEW  Comparison: 02/22/2011  Findings: Scarring in the lung bases.  No acute opacities or effusions.  Heart is normal size.  No  acute bony abnormality.  IMPRESSION: Bibasilar scarring.   Original Report Authenticated By: Cyndie Chime, M.D.     PHYSICAL EXAM General: NAD Neck: No JVD, no thyromegaly or thyroid nodule.  Lungs: Clear to auscultation bilaterally with normal respiratory effort. CV: Nondisplaced PMI.  Heart regular S1/S2, no S3/S4, no murmur.  No peripheral edema.  No carotid bruit.  Normal pedal pulses.  Abdomen: Soft, nontender, no hepatosplenomegaly, no distention.  Neurologic: Alert and oriented x 3.  Psych: Normal affect. Extremities: No clubbing or cyanosis.   TELEMETRY: Reviewed telemetry pt in NSR  ASSESSMENT AND PLAN: 71 yo with history of CAD admitted with symptoms consistent with unstable angina.  Cath yesterday showed anatomy similar to the past with severe OM and RCA disease.  He is neither a good PCI nor CABG candidate.  Plan for medical management of CAD.  Excellent lipids. - Add Imdur 30 mg daily => will medically manage, may need uptitration of Imdur +/- ranolazine in the future.  - Continue other home meds.  - Needs cardiac rehab.  - Ambulate in halls, if does well can go home this afternoon.  - Followup with me in 2 wks (may overbook to see me rather than  PA).   Marca Ancona 01/29/2012 8:24 AM

## 2012-01-29 NOTE — Discharge Summary (Signed)
Discharge Summary   Patient ID: Richard Ho,  MRN: [id number], DOB/AGE: 10-07-1940 71 y.o.  Admit date: 01/27/2012 Discharge date: 01/29/2012  Primary Physician: Rogelia Boga, MD Primary Cardiologist: Marca Ancona, MD  Discharge Diagnoses Principal Problem:  *Unstable angina Active Problems:  HYPERLIPIDEMIA  HYPERTENSION  ASTHMA  GERD  Diabetes type 2, controlled  Hypothyroidism  Macrocytic anemia   Allergies Allergies  Allergen Reactions  . Meloxicam Other (See Comments)    Severe gastritis    Diagnostic Studies/Procedures  CHEST X-RAY - 01/27/12  Comparison: 02/22/2011  Findings: Scarring in the lung bases. No acute opacities or  effusions. Heart is normal size. No acute bony abnormality.  IMPRESSION:  Bibasilar scarring.  CARDIAC CATHETERIZATION 0 01/28/12  Hemodynamics:  LV pressure: 142/5  Aortic pressure: 143/77  Angiography  Left Main: normal  Left anterior Descending: Proximal LAD has a 50% stenosis. The distal LAD has minor luminal irregularities. The 1st diagonal has a 30-40% stenosis at the take off.  Left Circumflex: Large vessel that trifurcates quickly into 3 branches. The 1st OM is severely diseased and is subtotally occluded. The OM 2 is small and has a mid 80% stenosis. The continuation branch is small and provides collateral flow the the distal RCA. The OM1 is tighter than his cath in 2005.  Right Coronary Artery: large and dominant. There is a long stenosis in the proximal and mid segments that ranges from 60% stenosis in the prox region and is 95% stenosed in the mid region. There are mild - moderate diffuse in the distal region. The PDA is severely diseased in the mid segment. The distal PLSA is occluded and fills via Left to right collaterals. The entire RCA is basically unchanged from his previous cath in 2005.  LV Gram: Normal LV function. Inferior lateral basal akinesis.   History of Present Illness  Richard Ho is a 71yo  male with PMHx significant for the above problem list who was admitted to [facility] on 01/27/12 with symptoms concerning for unstable angina. He has a prior history of CAD (s/p PTCA in 1991 in [place], [place] at [facility] in [place], cath in 2005 revealing moderate LAD and LCx disease, severe RCA disease; medically managed).   He had recently been seen the day prior by his PCP for fatigue on exertion and decreased energy. A stress test was subsequently scheduled. At 10:30 AM the morning of admission, the patient experienced intermittent, sudden back pain radiating to around his chest while eating. This was described as squeezing and rated at a 8/10 with associated diaphoresis, n/v and shortness of breath and lasting for two hours at a time. He does note chronic stable angina on exertion described as chest tightness radiating to both arms and his jaw, relieved with rest. His symptoms persisted and he called EMS. He was give ASA 81mg  x 4 and NTG SL x 3 and Zofrain with symptom improvement. He did note a headache. Upon ED arrival, EKG revealed no acute evidence of ischemia. Initial trop-I returned WNL. CXR as above revealed only bibasilar scarring.  BMET was unremarkable. CBC revealed a mild macrocytic anemia. Given his prior cardiac history, cardiac risk factors and history concerning for unstable angina, he was subsequently admitted with aims to proceed with diagnostic cardiac catheterization the following day.   Hospital Course   He was continued on his outpatient medications. He was started on heparin. Cardiac biomarkers were cycled and returned WNL. Hgb A1C returned at 6.4. TSH was WNL. The  following morning, he was informed, consented and prepped for cardiac catheterization which was accessed via the R femoral artery. The full details are outlined above. This was notable for normal left main, 50% pLAD stenosis, dLAD mild luminal irregularities, D1 30-40% stenosis at take off; subtotally  occluded/severely diseased OM1, mid 80% stenosis in small OM2, long prox and mid RCA stenoses ranging from 60-95%, severely diseased PDA, distal PLSA occluded filling via L-R collateral; normal LV function; inferior lateral basal akinesis. He tolerated the procedure well without complications. The patient had evidence of severe with no amenable PCI options. The recommendation was made to continue medical therapy. His coronary anatomy was noted to be largely unchanged. OM1 was qualified as tighter, but a poor PCI candidate.   There were no events overnight. Dr. Shirlee Latch evaluated the patient this morning, and found him to be stable for discharge pending ambulation. He ambulated with cardiac rehab without incident. He will be discharged on the medication regimen listed below. This includes his prior outpatient cardiac medication regimen, and will include the addition of Imdur as an antianginal. Consideration will be made to add ranolazine in the future if further anginal relief is warranted. He will follow-up with Dr. Shirlee Latch in two weeks. Regarding the patient's macrocytic anemia, this is suspected to be secondary to his underlying hypothyroidism. He will follow-up with his PCP for this.  Discharge Vitals:  Blood pressure 145/78, pulse 68, temperature 97.3 F (36.3 C), temperature source Oral, resp. rate 18, height 5\' 11"  (1.803 m), weight 98.158 kg (216 lb 6.4 oz), SpO2 94.00%.   Labs: Recent Labs  Basename 01/29/12 0600 01/28/12 0233   WBC 6.4 9.0   HGB 11.6* 11.5*   HCT 34.6* 34.6*   MCV 100.3* 98.9   PLT 217 223    Lab 01/29/12 0920 01/28/12 0233 01/27/12 1521 01/27/12 1506  NA 142 138 140 --  K 4.1 4.2 4.6 --  CL 104 103 104 --  CO2 27 27 -- --  BUN 15 17 18  --  CREATININE 1.27 1.18 1.10 --  CALCIUM 9.7 9.7 -- --  PROT -- 6.3 -- --  BILITOT -- 0.5 -- --  ALKPHOS -- 67 -- --  ALT -- 17 -- --  AST -- 16 -- --  AMYLASE -- -- -- --  LIPASE -- -- -- 52  GLUCOSE 105* 97 140* --   Recent  Labs  Basename 01/27/12 2123   HGBA1C 6.4*   Recent Labs  Basename 01/28/12 0856 01/28/12 0232 01/27/12 2125   CKTOTAL 81 84 95   CKMB 2.6 2.6 2.8   CKMBINDEX -- -- --   TROPONINI <0.30 <0.30 <0.30   Recent Labs  Basename 01/28/12 0233   CHOL 140   HDL 43   LDLCALC 64   TRIG 163*   CHOLHDL 3.3   LDLDIRECT --    Basename 01/27/12 2123  TSH 2.255  T4TOTAL --  T3FREE --  THYROIDAB --    Disposition:  Discharge Orders    Future Appointments: Provider: Department: Dept Phone: Center:   02/03/2012 11:45 AM [facility] [phone] None   02/12/2012 8:45 AM Laurey Morale, MD [facility] [place] [phone] [facility]   04/27/2012 8:30 AM Gordy Savers, MD [facility] [phone] [facility]     Future Orders Please Complete By Expires   Amb Referral to Cardiac Rehabilitation      Amb Referral to Cardiac Rehabilitation        Follow-up Information  Follow up with Marca Ancona, MD on 02/12/2012. (At 8:45 AM for follow-up after this hospitalization. )    Contact information:   [address] [place] [address] [phone]       Follow up with Rogelia Boga, MD. (In 1-2 weeks for follow-up after this hospitalization. )    Contact information:   [address] [facility] [place] [address] [phone]         Discharge Medications:  Medication List  As of 01/29/2012 12:59 PM   START taking these medications         isosorbide mononitrate 30 MG 24 hr tablet   Commonly known as: IMDUR   Take 1 tablet (30 mg total) by mouth daily.         CONTINUE taking these medications         albuterol 108 (90 BASE) MCG/ACT inhaler   Commonly known as: PROVENTIL HFA;VENTOLIN HFA      aspirin 81 MG EC tablet   Take 1 tablet (81 mg total) by mouth daily. Swallow whole.      atenolol 50 MG tablet   Commonly known as: TENORMIN       gabapentin 300 MG capsule   Commonly known as: NEURONTIN      glucose blood test strip   1 each by Other route daily. Use daily      HYOMAX-DT 0.375 MG Tbcr   Generic drug: Hyoscyamine Sulfate      levothyroxine 25 MCG tablet   Commonly known as: SYNTHROID, LEVOTHROID   Take 1 tablet (25 mcg total) by mouth daily.      metFORMIN 500 MG tablet   Commonly known as: GLUCOPHAGE      multivitamin tablet      nitroGLYCERIN 0.4 MG SL tablet   Commonly known as: NITROSTAT      nortriptyline 25 MG capsule   Commonly known as: PAMELOR   Take 1 capsule (25 mg total) by mouth at bedtime.      pantoprazole 40 MG tablet   Commonly known as: PROTONIX      ramipril 5 MG capsule   Commonly known as: ALTACE   Take 1 capsule (5 mg total) by mouth daily.      simvastatin 80 MG tablet   Commonly known as: ZOCOR      traMADol 50 MG tablet   Commonly known as: ULTRAM   Take 1 tablet (50 mg total) by mouth every 8 (eight) hours as needed for pain.      Vitamin D 2000 UNITS Caps          Where to get your medications    These are the prescriptions that you need to pick up. We sent them to a specific pharmacy, so you will need to go there to get them.   [id number] [address] - [place], [place] - [address]    [address] [place] [address]    Phone: [phone]        isosorbide mononitrate 30 MG 24 hr tablet            Outstanding Labs/Studies: None  Duration of Discharge Encounter: Greater than 30 minutes including physician time.  Signed, R. Hurman Horn, PA-C 01/29/2012, 12:59 PM

## 2012-01-29 NOTE — Telephone Encounter (Signed)
Pt was admitted to Cardiac Care unit Jan 27, 2012. Was D/C 8/22. Was admitted for acute unstable angina. They requested that he notify his PCP on this. Pt is following up with Cardiology in two wks and is in the Cardiac Rehab program.  Pt also states that he needs a rx for nitroGLYCERIN (NITROSTAT) 0.4 MG SL tablet. Has been on it before, and it is on med list. To save money, he would like the ones that come in a tiny bottle of 25 tablets. He uses [facility] on [address]. Was told he should get that from this office, not Cardiology. Pt also states that while in hospital, one medication was added: isosorbide mononitrate (IMDUR) 30 MG 24 hr tablet Wanted Dr. Kirtland Bouchard to be aware of all of this, as well as request the refill. Thank you.

## 2012-01-29 NOTE — Progress Notes (Signed)
Pt up ambulating in hallway independently at this time; no c/o pain; no SOB; will cont. To monitor.

## 2012-01-29 NOTE — Telephone Encounter (Signed)
Pls advise.  

## 2012-01-30 LAB — GLUCOSE, CAPILLARY
Glucose-Capillary: 93 mg/dL (ref 70–99)
Glucose-Capillary: 92 mg/dL (ref 70–99)

## 2012-02-03 ENCOUNTER — Other Ambulatory Visit (HOSPITAL_COMMUNITY): Payer: Medicare Other

## 2012-02-12 ENCOUNTER — Encounter: Payer: Self-pay | Admitting: Cardiology

## 2012-02-12 ENCOUNTER — Ambulatory Visit (INDEPENDENT_AMBULATORY_CARE_PROVIDER_SITE_OTHER): Payer: Medicare Other | Admitting: Cardiology

## 2012-02-12 VITALS — BP 152/88 | HR 74 | Ht 71.0 in | Wt 214.0 lb

## 2012-02-12 DIAGNOSIS — E785 Hyperlipidemia, unspecified: Secondary | ICD-10-CM

## 2012-02-12 DIAGNOSIS — I10 Essential (primary) hypertension: Secondary | ICD-10-CM

## 2012-02-12 DIAGNOSIS — I2510 Atherosclerotic heart disease of native coronary artery without angina pectoris: Secondary | ICD-10-CM

## 2012-02-12 MED ORDER — ZOLPIDEM TARTRATE 5 MG PO TABS: 5.0000 mg | ORAL_TABLET | Freq: Every evening | ORAL | Status: DC | PRN

## 2012-02-12 NOTE — Assessment & Plan Note (Signed)
LDL at goal (< 70) when checked in 8/13.

## 2012-02-12 NOTE — Assessment & Plan Note (Signed)
Diffuse CAD, most marked in small to moderate OMs and throughout the RCA system.  Plan for medical management of angina.  He has been doing well since starting Imdur with no chest pain or exertional dyspnea.  Continue ASA 81, ramipril, statin, atenolol, and Imdur at current doses. I will have him start cardiac rehab for chronic angina.  I am also going to have him get an echo to assess LV function in the setting of severe RCA disease.

## 2012-02-12 NOTE — Patient Instructions (Addendum)
Your physician has requested that you have an echocardiogram. Echocardiography is a painless test that uses sound waves to create images of your heart. It provides your doctor with information about the size and shape of your heart and how well your heart's chambers and valves are working. This procedure takes approximately one hour. There are no restrictions for this procedure.  Dr Shirlee Latch has provided you with a prescription for Ambien 5mg  to use at bedtime as needed for sleep. Dr Shirlee Latch will not be able to provide you with refills for this. Check with Dr Amador Cunas if you need a refill for Ambien.   Your physician recommends that you schedule a follow-up appointment in: 3 months with Dr Shirlee Latch.

## 2012-02-12 NOTE — Progress Notes (Signed)
PCP: Dr. Amador Cunas  71 yo with history of CAD s/p recent admission with unstable angina presents for cardiology followup.  He has a history of angioplasty in [place] and in [place] in the 1990s (not sure which vessel).  Cath in 2005 showed severe, diffuse RCA system disease.  He was admitted in 8/13 with exertional chest pain and dyspnea.  He had a left heart cath showing significant disease in small OMs as well as severe diffuse RCA system disease, similar to the prior cath.  He was managed medically as there were not good interventional targets in the RCA system.  I started him on Imdur 30 mg daily.  Since that time, he has been feeling quite well.  He has had no further exertional chest pain or dyspnea.  He is ready to start cardiac rehab.  He does have some chronic left shoulder pain but suspects that this is musculoskeletal.  BP is high in the office today but has been in the 110s-120s systolic when he checks at home, which he does frequently.   Labs (8/13): K 4.1, creatinine 1.27, LDL 64, HDL 43  PMH: 1. CAD: Patient had angioplasty in [place] and in [place] in the 1990s, unsure what vessels were targeted.  He had a myoview with inferior ischemia in 2005 and had LHC showing severe diffuse RCA disease that was treated medically.  He presented to [facility] with symptoms consistent with unstable angina in 8/13.  LHC showed subtotal occlusion of OM1, small OM2 with 80% stenosis, 50% pLAD stenosis, 95% mRCA stenosis, severe diffuse PDA disease, occlusion of the PLV distally with left to right collaterals, EF normal with inferobasal hypokinesis.  Patient was medically managed.  2. Asthma 3. GERD 4. HTN 5. Diabetes mellitus 6. Diabetic neuropathy  SH: Married, retired.  [place] veteran.  Nonsmoker.    FH: No premature CAD  ROS: All systems reviewed and negative except as per HPI.   Current Outpatient Prescriptions  Medication Sig Dispense Refill  . albuterol (PROVENTIL HFA;VENTOLIN HFA) 108 (90  BASE) MCG/ACT inhaler Inhale 2 puffs into the lungs every 4 (four) hours as needed. For shortness of breath      . aspirin 81 MG EC tablet Take 1 tablet (81 mg total) by mouth daily. Swallow whole.  30 tablet  12  . atenolol (TENORMIN) 50 MG tablet Take 50 mg by mouth 2 (two) times daily.      . Cholecalciferol (VITAMIN D) 2000 UNITS CAPS Take 2,000 Units by mouth daily.       Marland Kitchen gabapentin (NEURONTIN) 300 MG capsule Take 300 mg by mouth 2 (two) times daily.       Marland Kitchen glucose blood (PRECISION XTRA TEST STRIPS) test strip 1 each by Other route daily. Use daily  100 each  6  . Hyoscyamine Sulfate (HYOMAX-DT) 0.375 MG TBCR Take 1-2 tablets by mouth daily as needed. For irritable bowel syndrome      . isosorbide mononitrate (IMDUR) 30 MG 24 hr tablet Take 1 tablet (30 mg total) by mouth daily.  30 tablet  3  . levothyroxine (LEVOTHROID) 25 MCG tablet Take 1 tablet (25 mcg total) by mouth daily.  90 tablet  6  . metFORMIN (GLUCOPHAGE) 500 MG tablet Take 500 mg by mouth 2 (two) times daily with a meal.        . Multiple Vitamin (MULTIVITAMIN) tablet Take 1 tablet by mouth daily.        . nitroGLYCERIN (NITROSTAT) 0.4 MG SL tablet Place 1 tablet (  0.4 mg total) under the tongue every 5 (five) minutes as needed. Chest pain  25 tablet  3  . nortriptyline (PAMELOR) 25 MG capsule Take 1 capsule (25 mg total) by mouth at bedtime.  90 capsule  4  . pantoprazole (PROTONIX) 40 MG tablet Take 40 mg by mouth daily. Take one half hour before eating.       . ramipril (ALTACE) 5 MG capsule Take 1 capsule (5 mg total) by mouth daily.  90 capsule  3  . simvastatin (ZOCOR) 80 MG tablet Take 40 mg by mouth at bedtime.       Marland Kitchen zolpidem (AMBIEN) 5 MG tablet Take 1 tablet (5 mg total) by mouth at bedtime as needed for sleep.  30 tablet  0    BP 152/88  Pulse 74  Ht 5\' 11"  (1.803 m)  Wt 214 lb (97.07 kg)  BMI 29.85 kg/m2 General: NAD Neck: No JVD, no thyromegaly or thyroid nodule.  Lungs: Clear to auscultation bilaterally  with normal respiratory effort. CV: Nondisplaced PMI.  Heart regular S1/S2, no S3/S4, no murmur.  No peripheral edema.  No carotid bruit.  Normal pedal pulses.  Abdomen: Soft, nontender, no hepatosplenomegaly, no distention.  Skin: Intact without lesions or rashes.  Neurologic: Alert and oriented x 3.  Psych: Normal affect. Extremities: No clubbing or cyanosis.

## 2012-02-12 NOTE — Assessment & Plan Note (Signed)
BP is in normal range when he checks at home.  I will not change his meds but will have him bring cuff in for calibration at next office visit.

## 2012-02-19 ENCOUNTER — Encounter (HOSPITAL_COMMUNITY)
Admission: RE | Admit: 2012-02-19 | Discharge: 2012-02-19 | Disposition: A | Discharge status: Home / Self Care | Payer: Medicare Other | Source: Ambulatory Visit | Attending: Cardiology | Admitting: Cardiology

## 2012-02-19 DIAGNOSIS — Z5189 Encounter for other specified aftercare: Secondary | ICD-10-CM | POA: Insufficient documentation

## 2012-02-19 DIAGNOSIS — I2510 Atherosclerotic heart disease of native coronary artery without angina pectoris: Secondary | ICD-10-CM | POA: Insufficient documentation

## 2012-02-19 NOTE — Progress Notes (Signed)
.  Cardiac Rehab Medication Review by a Pharmacist  Does the patient  feel that his/her medications are working for him/her?  yes  Has the patient been experiencing any side effects to the medications prescribed?  no  Does the patient measure his/her own blood pressure or blood glucose at home?  yes - both. Checks BG at least once a week. No regular BP checks.   Does the patient have any problems obtaining medications due to transportation or finances?   no  Understanding of regimen: excellent Understanding of indications: good Potential of compliance: excellent    Pharmacist comments: Patient understands regimens and can report timing without prompting. Some new medications he wasn't sure about.     Doris Cheadle, PharmD Clinical Pharmacist Pager: [phone] Phone: [phone] 02/19/2012 8:14 AM

## 2012-02-23 ENCOUNTER — Ambulatory Visit (HOSPITAL_COMMUNITY): Payer: Medicare Other | Attending: Internal Medicine

## 2012-02-23 DIAGNOSIS — I2510 Atherosclerotic heart disease of native coronary artery without angina pectoris: Secondary | ICD-10-CM | POA: Insufficient documentation

## 2012-02-23 DIAGNOSIS — E1142 Type 2 diabetes mellitus with diabetic polyneuropathy: Secondary | ICD-10-CM | POA: Insufficient documentation

## 2012-02-23 DIAGNOSIS — I379 Nonrheumatic pulmonary valve disorder, unspecified: Secondary | ICD-10-CM | POA: Insufficient documentation

## 2012-02-23 DIAGNOSIS — I369 Nonrheumatic tricuspid valve disorder, unspecified: Secondary | ICD-10-CM | POA: Insufficient documentation

## 2012-02-23 DIAGNOSIS — I10 Essential (primary) hypertension: Secondary | ICD-10-CM | POA: Insufficient documentation

## 2012-02-23 DIAGNOSIS — E1149 Type 2 diabetes mellitus with other diabetic neurological complication: Secondary | ICD-10-CM | POA: Insufficient documentation

## 2012-02-23 NOTE — Progress Notes (Signed)
Echocardiogram performed.  

## 2012-02-25 ENCOUNTER — Encounter (HOSPITAL_COMMUNITY): Payer: Self-pay

## 2012-02-25 ENCOUNTER — Encounter (HOSPITAL_COMMUNITY)
Admission: RE | Admit: 2012-02-25 | Discharge: 2012-02-25 | Disposition: A | Discharge status: Home / Self Care | Payer: Medicare Other | Source: Ambulatory Visit | Attending: Cardiology | Admitting: Cardiology

## 2012-02-25 LAB — GLUCOSE, CAPILLARY: Glucose-Capillary: 84 mg/dL (ref 70–99)

## 2012-02-25 NOTE — Progress Notes (Signed)
Pt arrived at cardiac rehab, CBG-84. Pt reports he ate a waffle this am.  Pt given lemonade and peanut butter crackers.  15 min CBG recheck-87.  Pt did not exercise today.  Pt advised to add more carbohydrates and protein to meal prior to coming to cardiac rehab, such as egg whites, juice.  Understanding verbalized

## 2012-02-27 ENCOUNTER — Encounter (HOSPITAL_COMMUNITY)
Admission: RE | Admit: 2012-02-27 | Discharge: 2012-02-27 | Disposition: A | Discharge status: Home / Self Care | Payer: Medicare Other | Source: Ambulatory Visit | Attending: Cardiology | Admitting: Cardiology

## 2012-02-27 NOTE — Progress Notes (Signed)
Pt started cardiac rehab today.  Pt tolerated light exercise without difficulty.   Asymptomatic.   VSS, telemetry-NSR.  Pt oriented to exercise equipment and routine.  Understanding verbalized. 

## 2012-03-01 ENCOUNTER — Encounter (HOSPITAL_COMMUNITY)
Admission: RE | Admit: 2012-03-01 | Discharge: 2012-03-01 | Disposition: A | Discharge status: Home / Self Care | Payer: Medicare Other | Source: Ambulatory Visit | Attending: Cardiology | Admitting: Cardiology

## 2012-03-01 LAB — GLUCOSE, CAPILLARY
Glucose-Capillary: 108 mg/dL — ABNORMAL HIGH (ref 70–99)
Glucose-Capillary: 127 mg/dL — ABNORMAL HIGH (ref 70–99)

## 2012-03-03 ENCOUNTER — Encounter (HOSPITAL_COMMUNITY)
Admission: RE | Admit: 2012-03-03 | Discharge: 2012-03-03 | Disposition: A | Discharge status: Home / Self Care | Payer: Medicare Other | Source: Ambulatory Visit | Attending: Cardiology | Admitting: Cardiology

## 2012-03-03 NOTE — Progress Notes (Signed)
Pt c/o chest and throat tightness typical for his asthma symptoms.  Pt reports sx began last night and persists into exercise today.  Pt states he has not used his inhaler.  Pt advised to use inhaler as directed.  Pt did so with relief of symptoms.  Will continue to monitor.

## 2012-03-05 ENCOUNTER — Encounter (HOSPITAL_COMMUNITY): Payer: Medicare Other

## 2012-03-08 ENCOUNTER — Encounter (HOSPITAL_COMMUNITY)
Admission: RE | Admit: 2012-03-08 | Discharge: 2012-03-08 | Disposition: A | Discharge status: Home / Self Care | Payer: Medicare Other | Source: Ambulatory Visit | Attending: Cardiology | Admitting: Cardiology

## 2012-03-08 LAB — GLUCOSE, CAPILLARY: Glucose-Capillary: 117 mg/dL — ABNORMAL HIGH (ref 70–99)

## 2012-03-08 NOTE — Progress Notes (Signed)
Reviewed home exercise guidelines with patient including endpoints, temperature precautions, target heart rate and rate of perceived exertions. Pt is walking on the treadmill as his mode of home exercise and [facility] Pt voices understanding of instructions given.

## 2012-03-10 ENCOUNTER — Encounter (HOSPITAL_COMMUNITY)
Admission: RE | Admit: 2012-03-10 | Discharge: 2012-03-10 | Disposition: A | Discharge status: Home / Self Care | Payer: Medicare Other | Source: Ambulatory Visit | Attending: Cardiology | Admitting: Cardiology

## 2012-03-10 DIAGNOSIS — Z5189 Encounter for other specified aftercare: Secondary | ICD-10-CM | POA: Insufficient documentation

## 2012-03-10 DIAGNOSIS — I2510 Atherosclerotic heart disease of native coronary artery without angina pectoris: Secondary | ICD-10-CM | POA: Insufficient documentation

## 2012-03-12 ENCOUNTER — Encounter (HOSPITAL_COMMUNITY)
Admission: RE | Admit: 2012-03-12 | Discharge: 2012-03-12 | Disposition: A | Discharge status: Home / Self Care | Payer: Medicare Other | Source: Ambulatory Visit | Attending: Cardiology | Admitting: Cardiology

## 2012-03-12 LAB — GLUCOSE, CAPILLARY
Glucose-Capillary: 132 mg/dL — ABNORMAL HIGH (ref 70–99)
Glucose-Capillary: 95 mg/dL (ref 70–99)

## 2012-03-15 ENCOUNTER — Encounter (HOSPITAL_COMMUNITY)
Admission: RE | Admit: 2012-03-15 | Discharge: 2012-03-15 | Disposition: A | Discharge status: Home / Self Care | Payer: Medicare Other | Source: Ambulatory Visit | Attending: Cardiology | Admitting: Cardiology

## 2012-03-15 LAB — GLUCOSE, CAPILLARY: Glucose-Capillary: 104 mg/dL — ABNORMAL HIGH (ref 70–99)

## 2012-03-15 NOTE — Progress Notes (Signed)
[name] 71 y.o. male Nutrition Note Spoke with pt.  Nutrition Plan and cholesterol goals reviewed with pt. Pt wants to lose wt. Per pt, "the wt loss isn't going so well." Pt is diabetic. Last A1c indicates blood glucose well-controlled. Pt was given a 75 gm CHO per meal diet by the [place], which pt felt was not explained well. Pt checks CBG's once a week. Pre-exercise CBG's 84-134 mg/dL and post exercise CBG's have been 87-127 mg/dL. Pre-exercise breakfast reportedly consists of 2 slices of bread, egg beaters, 1/2 cup cottage cheese, and Tang. Per discussion, pt agreed to try 2 slices of toast with peanut butter, a banana, and a [place] style or light yogurt. Pt states he has been eating a snack frequently before exercise and has recently started holding his am Metformin until after class. Pt states he feels weak in the afternoon often and pt wonders if it is his BP or CBG. Pt encouraged to check CBG's when he feels weak. Pt is going to hold am Metformin x 1 week and monitor CBG's to see if he can avoid pre-exercise snack and post-exercise afternoon weakness. Pt expressed understanding of the above information reviewed. Pt aware of nutrition education classes offered and plans on attending nutrition classes. Pt interested in individual counseling re: DM and plans to schedule an appt in the near future.  Nutrition Diagnosis   Food-and nutrition-related knowledge deficit related to lack of exposure to information as related to diagnosis of: ? CVD ? DM (A1c 6.4)\   Overweight related to excessive energy intake as evidenced by a BMI of 29.8  Nutrition RX/ Estimated Daily Nutrition Needs for: wt loss  1600-2100 Kcal, 45-55 gm fat, 10-14 gm sat fat, 1.6-2.1 gm trans-fat, <1500 mg sodium, 175-250 gm CHO   Nutrition Intervention   Pt's individual nutrition plan including cholesterol goals reviewed with pt.   Pt to attend the Portion Distortion class   Pt to attend the  ? Nutrition I class    ? Nutrition II class        ? Diabetes Blitz class          ? Diabetes Q & A class Pt given handouts for: ? 1800 kcal, 5-day menu ideas   Continue client-centered nutrition education by RD, as part of interdisciplinary care. Goal(s)   Pt to identify food quantities necessary to achieve: ? wt loss to a goal wt of 189-201 lb (85.9-91.3 kg) at graduation from cardiac rehab.    Pt to describe the benefit of including fruits, vegetables, and whole grains in a heart healthy meal plan.   Use pre-meal and post-meal CBG's and A1c to determine whether adjustments in food/meal planning will be beneficial or if any meds need to be combined with nutrition therapy. Monitor and Evaluate progress toward nutrition goal with team. Nutrition Risk: Change to Moderate

## 2012-03-17 ENCOUNTER — Encounter (HOSPITAL_COMMUNITY)
Admission: RE | Admit: 2012-03-17 | Discharge: 2012-03-17 | Disposition: A | Discharge status: Home / Self Care | Payer: Medicare Other | Source: Ambulatory Visit | Attending: Cardiology | Admitting: Cardiology

## 2012-03-17 LAB — GLUCOSE, CAPILLARY: Glucose-Capillary: 153 mg/dL — ABNORMAL HIGH (ref 70–99)

## 2012-03-17 NOTE — Progress Notes (Signed)
[name] 71 y.o. male Nutrition Note Spoke with pt.  Nutrition survey reviewed with pt. Pt making many healthy food choices. Pt pre-exercise CBG: 153 mg/dL after eating [place] yogurt with raisins, cranberries, splenda and sugar-free jelly, a banana, and Tang. Per previous discussion, pt agreed to try 2 slices of toast with peanut butter, a banana, and a [place] style or light yogurt. Pt post-exercise CBG decreased to 84 mg/dL. Pt encouraged to add a high fiber cereal/bread to his breakfast. Pt is holding am Metformin x 1 week and monitoring CBG's to see if he can avoid pre-exercise snack and post-exercise afternoon weakness. Pt expressed understanding of the information reviewed. Pt scheduled an individual DM education session with this Clinical research associate on Wed 03/24/12.  Nutrition Diagnosis   Food-and nutrition-related knowledge deficit related to lack of exposure to information as related to diagnosis of: ? CVD ? DM (A1c 6.4)   Overweight related to excessive energy intake as evidenced by a BMI of 29.8  Nutrition RX/ Estimated Daily Nutrition Needs for: wt loss  1600-2100 Kcal, 45-55 gm fat, 10-14 gm sat fat, 1.6-2.1 gm trans-fat, <1500 mg sodium, 175-250 gm CHO   Nutrition Intervention   Benefits of adopting Therapeutic Lifestyle Changes discussed when Medficts reviewed.    Individual DM education session scheduled for 03/24/12   Pt to attend the Portion Distortion class - met 03/03/12   Pt to attend the  ? Nutrition I class                     ? Nutrition II class        ? Diabetes Blitz class          ? Diabetes Q & A class   Continue client-centered nutrition education by RD, as part of interdisciplinary care. Goal(s)   Pt to identify food quantities necessary to achieve: ? wt loss to a goal wt of 189-201 lb (85.9-91.3 kg) at graduation from cardiac rehab.    Pt to describe the benefit of including fruits, vegetables, and whole grains in a heart healthy meal plan.   Use pre-meal and post-meal  CBG's and A1c to determine whether adjustments in food/meal planning will be beneficial or if any meds need to be combined with nutrition therapy. Monitor and Evaluate progress toward nutrition goal with team. Nutrition Risk: Moderate

## 2012-03-19 ENCOUNTER — Encounter (HOSPITAL_COMMUNITY)
Admission: RE | Admit: 2012-03-19 | Discharge: 2012-03-19 | Disposition: A | Discharge status: Home / Self Care | Payer: Medicare Other | Source: Ambulatory Visit | Attending: Cardiology | Admitting: Cardiology

## 2012-03-22 ENCOUNTER — Encounter (HOSPITAL_COMMUNITY)
Admission: RE | Admit: 2012-03-22 | Discharge: 2012-03-22 | Disposition: A | Discharge status: Home / Self Care | Payer: Medicare Other | Source: Ambulatory Visit | Attending: Cardiology | Admitting: Cardiology

## 2012-03-24 ENCOUNTER — Encounter (HOSPITAL_COMMUNITY)
Admission: RE | Admit: 2012-03-24 | Discharge: 2012-03-24 | Disposition: A | Discharge status: Home / Self Care | Payer: Medicare Other | Source: Ambulatory Visit | Attending: Cardiology | Admitting: Cardiology

## 2012-03-24 LAB — GLUCOSE, CAPILLARY: Glucose-Capillary: 88 mg/dL (ref 70–99)

## 2012-03-24 NOTE — Progress Notes (Signed)
[name] 71 y.o. male Nutrition Education Note Spoke with pt.  Pt is holding am Metformin and monitoring CBG's. Pt did not bring glucometer in. Pt reports am CBG's WNL with the exception of this am. Pt states fasting CBG 150 mg/dL "because I had a piece of cake last night." Pre-exercise CBG's the past 2 exercise sessions have been 200 mg/dL and 409 mg/dL. Per pt, "I tried to load up on carbs the first morning I came in without taking my Metformin." Pt with questions re: DM diet and Heart Healthy diet. Basics of DM diet and blood glucose goals discussed. Per pt, the RD at the [facility] told him to consume between 70-75 grams of CHO per meal. CHO counting discussed. Pt looking at sugar content rather than total CHO.  Pt states he lost his 1800 kcal, 5-day menu ideas. Pt expressed understanding of the information reviewed.   Nutrition Diagnosis   Food-and nutrition-related knowledge deficit related to lack of exposure to information as related to diagnosis of: ? CVD ? DM (A1c 6.4)   Overweight related to excessive energy intake as evidenced by a BMI of 29.8  Nutrition RX/ Estimated Daily Nutrition Needs for: wt loss  1600-2100 Kcal, 45-55 gm fat, 10-14 gm sat fat, 1.6-2.1 gm trans-fat, <1500 mg sodium, 175-250 gm CHO   Nutrition Intervention   Pt's individual nutrition plan including cholesterol goals reviewed with pt.    Handouts given for: CHO counting basics, blood glucose goals, 175-250 gm Consistent CHO diet, 1800 kcal, 5-day menu ideas   Pt to continue holding Metformin and monitoring CBG's.   Pt to attend the  ? Nutrition I class                         ? Nutrition II class        ? Diabetes Blitz class          ? Diabetes Q & A class   Continue client-centered nutrition education by RD, as part of interdisciplinary care. Goal(s)   Pt to identify food quantities necessary to achieve: ? wt loss to a goal wt of 189-201 lb (85.9-91.3 kg) at graduation from cardiac rehab.    Pt to describe the  benefit of including fruits, vegetables, and whole grains in a heart healthy meal plan.   Use pre-meal and post-meal CBG's and A1c to determine whether adjustments in food/meal planning will be beneficial or if any meds need to be combined with nutrition therapy. Monitor and Evaluate progress toward nutrition goal with team. Nutrition Risk: Moderate

## 2012-03-26 ENCOUNTER — Encounter (HOSPITAL_COMMUNITY)
Admission: RE | Admit: 2012-03-26 | Discharge: 2012-03-26 | Disposition: A | Discharge status: Home / Self Care | Payer: Medicare Other | Source: Ambulatory Visit | Attending: Cardiology | Admitting: Cardiology

## 2012-03-26 LAB — GLUCOSE, CAPILLARY
Glucose-Capillary: 159 mg/dL — ABNORMAL HIGH (ref 70–99)
Glucose-Capillary: 98 mg/dL (ref 70–99)

## 2012-03-29 ENCOUNTER — Encounter (HOSPITAL_COMMUNITY)
Admission: RE | Admit: 2012-03-29 | Discharge: 2012-03-29 | Disposition: A | Discharge status: Home / Self Care | Payer: Medicare Other | Source: Ambulatory Visit | Attending: Cardiology | Admitting: Cardiology

## 2012-03-29 LAB — GLUCOSE, CAPILLARY
Glucose-Capillary: 162 mg/dL — ABNORMAL HIGH (ref 70–99)
Glucose-Capillary: 117 mg/dL — ABNORMAL HIGH (ref 70–99)

## 2012-03-29 NOTE — Progress Notes (Signed)
Pt arrived to cardiac rehab reporting episode of fatigue post rehab on Friday. Sx relieved with taking 3 hour nap.  Pt states his CBG-120, however did not check his BP.  Pt denies any further sx since.  Pt exercised today without difficulty.  Will continue to monitor.

## 2012-03-31 ENCOUNTER — Encounter (HOSPITAL_COMMUNITY)
Admission: RE | Admit: 2012-03-31 | Discharge: 2012-03-31 | Disposition: A | Discharge status: Home / Self Care | Payer: Medicare Other | Source: Ambulatory Visit | Attending: Cardiology | Admitting: Cardiology

## 2012-03-31 LAB — GLUCOSE, CAPILLARY: Glucose-Capillary: 193 mg/dL — ABNORMAL HIGH (ref 70–99)

## 2012-03-31 NOTE — Progress Notes (Signed)
Faxed patient's current MET level to Dr Alford Highland office, per request for [facility] disability paperwork.

## 2012-04-02 ENCOUNTER — Encounter (HOSPITAL_COMMUNITY)
Admission: RE | Admit: 2012-04-02 | Discharge: 2012-04-02 | Disposition: A | Discharge status: Home / Self Care | Payer: Medicare Other | Source: Ambulatory Visit | Attending: Cardiology | Admitting: Cardiology

## 2012-04-02 LAB — GLUCOSE, CAPILLARY: Glucose-Capillary: 168 mg/dL — ABNORMAL HIGH (ref 70–99)

## 2012-04-05 ENCOUNTER — Encounter (HOSPITAL_COMMUNITY)
Admission: RE | Admit: 2012-04-05 | Discharge: 2012-04-05 | Disposition: A | Discharge status: Home / Self Care | Payer: Medicare Other | Source: Ambulatory Visit | Attending: Cardiology | Admitting: Cardiology

## 2012-04-05 LAB — GLUCOSE, CAPILLARY: Glucose-Capillary: 137 mg/dL — ABNORMAL HIGH (ref 70–99)

## 2012-04-07 ENCOUNTER — Encounter (HOSPITAL_COMMUNITY)
Admission: RE | Admit: 2012-04-07 | Discharge: 2012-04-07 | Disposition: A | Discharge status: Home / Self Care | Payer: Medicare Other | Source: Ambulatory Visit | Attending: Cardiology | Admitting: Cardiology

## 2012-04-07 LAB — GLUCOSE, CAPILLARY
Glucose-Capillary: 132 mg/dL — ABNORMAL HIGH (ref 70–99)
Glucose-Capillary: 101 mg/dL — ABNORMAL HIGH (ref 70–99)

## 2012-04-09 ENCOUNTER — Encounter (HOSPITAL_COMMUNITY)
Admission: RE | Admit: 2012-04-09 | Discharge: 2012-04-09 | Disposition: A | Discharge status: Home / Self Care | Payer: Medicare Other | Source: Ambulatory Visit | Attending: Cardiology | Admitting: Cardiology

## 2012-04-09 DIAGNOSIS — I2510 Atherosclerotic heart disease of native coronary artery without angina pectoris: Secondary | ICD-10-CM | POA: Insufficient documentation

## 2012-04-09 DIAGNOSIS — Z5189 Encounter for other specified aftercare: Secondary | ICD-10-CM | POA: Insufficient documentation

## 2012-04-09 LAB — GLUCOSE, CAPILLARY
Glucose-Capillary: 159 mg/dL — ABNORMAL HIGH (ref 70–99)
Glucose-Capillary: 125 mg/dL — ABNORMAL HIGH (ref 70–99)

## 2012-04-12 ENCOUNTER — Encounter (HOSPITAL_COMMUNITY): Payer: Medicare Other

## 2012-04-14 ENCOUNTER — Telehealth (HOSPITAL_COMMUNITY): Payer: Self-pay | Admitting: Cardiac Rehabilitation

## 2012-04-14 ENCOUNTER — Encounter (HOSPITAL_COMMUNITY): Payer: Medicare Other

## 2012-04-14 NOTE — Telephone Encounter (Signed)
pc to assess reason for absence from cardiac rehab x 2 days.  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  Pt states he will return on Friday.  Pt had multiple questions about his CBG readings at home, have been within acceptable range so he questions if he should decrease his metformin dosage.  Pt instructed to bring his home CBG readings to cardiac rehab on Friday to discuss with Mickle Plumb, RD, CDE.  Understanding verbalized

## 2012-04-16 ENCOUNTER — Encounter (HOSPITAL_COMMUNITY)
Admission: RE | Admit: 2012-04-16 | Discharge: 2012-04-16 | Disposition: A | Discharge status: Home / Self Care | Payer: Medicare Other | Source: Ambulatory Visit | Attending: Cardiology | Admitting: Cardiology

## 2012-04-16 LAB — GLUCOSE, CAPILLARY: Glucose-Capillary: 148 mg/dL — ABNORMAL HIGH (ref 70–99)

## 2012-04-16 NOTE — Progress Notes (Signed)
Reviewed quality of life questionnaire with pt.  Pt scored moderately dissatisfied in multiple areas at initial cardiac rehab visit, primarily r/t his health and limiting symptoms.  However now pt reports the angina is resolved with addition of imdur which has greatly improved his quality of life, his shortness of breath and energy levels are improving with his increased activity at cardiac rehab.  Pt demonstrates positive coping skills and overall improvement with his quality of life thus far.

## 2012-04-19 ENCOUNTER — Encounter (HOSPITAL_COMMUNITY)
Admission: RE | Admit: 2012-04-19 | Discharge: 2012-04-19 | Disposition: A | Discharge status: Home / Self Care | Payer: Medicare Other | Source: Ambulatory Visit | Attending: Cardiology | Admitting: Cardiology

## 2012-04-19 LAB — GLUCOSE, CAPILLARY: Glucose-Capillary: 113 mg/dL — ABNORMAL HIGH (ref 70–99)

## 2012-04-21 ENCOUNTER — Encounter (HOSPITAL_COMMUNITY)
Admission: RE | Admit: 2012-04-21 | Discharge: 2012-04-21 | Disposition: A | Discharge status: Home / Self Care | Payer: Medicare Other | Source: Ambulatory Visit | Attending: Cardiology | Admitting: Cardiology

## 2012-04-23 ENCOUNTER — Encounter (HOSPITAL_COMMUNITY)
Admission: RE | Admit: 2012-04-23 | Discharge: 2012-04-23 | Disposition: A | Discharge status: Home / Self Care | Payer: Medicare Other | Source: Ambulatory Visit | Attending: Cardiology | Admitting: Cardiology

## 2012-04-23 LAB — GLUCOSE, CAPILLARY
Glucose-Capillary: 168 mg/dL — ABNORMAL HIGH (ref 70–99)
Glucose-Capillary: 88 mg/dL (ref 70–99)

## 2012-04-26 ENCOUNTER — Encounter (HOSPITAL_COMMUNITY)
Admission: RE | Admit: 2012-04-26 | Discharge: 2012-04-26 | Disposition: A | Discharge status: Home / Self Care | Payer: Medicare Other | Source: Ambulatory Visit | Attending: Cardiology | Admitting: Cardiology

## 2012-04-26 LAB — GLUCOSE, CAPILLARY: Glucose-Capillary: 109 mg/dL — ABNORMAL HIGH (ref 70–99)

## 2012-04-27 ENCOUNTER — Encounter: Payer: Self-pay | Admitting: Internal Medicine

## 2012-04-27 ENCOUNTER — Telehealth (HOSPITAL_COMMUNITY): Payer: Self-pay | Admitting: Cardiac Rehabilitation

## 2012-04-27 ENCOUNTER — Ambulatory Visit (INDEPENDENT_AMBULATORY_CARE_PROVIDER_SITE_OTHER): Payer: Medicare Other | Admitting: Internal Medicine

## 2012-04-27 VITALS — BP 136/80 | HR 64 | Temp 97.5°F | Resp 18 | Wt 225.0 lb

## 2012-04-27 DIAGNOSIS — E119 Type 2 diabetes mellitus without complications: Secondary | ICD-10-CM

## 2012-04-27 DIAGNOSIS — I2510 Atherosclerotic heart disease of native coronary artery without angina pectoris: Secondary | ICD-10-CM

## 2012-04-27 DIAGNOSIS — I10 Essential (primary) hypertension: Secondary | ICD-10-CM

## 2012-04-27 DIAGNOSIS — E785 Hyperlipidemia, unspecified: Secondary | ICD-10-CM

## 2012-04-27 NOTE — Progress Notes (Signed)
Thanks

## 2012-04-27 NOTE — Patient Instructions (Signed)
Limit your sodium (Salt) intake  Continue cardiac rehabilitation   Please check your hemoglobin A1c every 3 months  You need to lose weight.  Consider a lower calorie diet and regular exercise.

## 2012-04-27 NOTE — Progress Notes (Signed)
Subjective:    Patient ID: Richard Ho, male    DOB: [birth year], 71 y.o.   MRN: [id number]  HPI  71 year old patient who is seen today for followup. Shortly after his last visit here he was admitted for unstable angina and underwent heart catheterization. This revealed significant but stable CAD and he continues under medical treatment. He remains in cardiac rehabilitation. Hospital records reviewed. Clinically he has done quite well His diabetes has been well-controlled. He has a history of hypertension and dyslipidemia.  Past Medical History  Diagnosis Date  . Asthma   . Coronary artery disease 1991    a) Angioplasty LAD 1991 [place] b) MI in 1995 at  [facility] in [place], med Rx  . GERD (gastroesophageal reflux disease)   . Hyperlipidemia   . Myocardial infarct   . Nephrolithiasis   . Osteopenia   . HTN (hypertension)   . Diabetes mellitus   . Hearing loss     History   Social History  . Marital Status: Married    Spouse Name: N/A    Number of Children: N/A  . Years of Education: N/A   Occupational History  . Retired    Social History Main Topics  . Smoking status: Never Smoker   . Smokeless tobacco: Never Used  . Alcohol Use: No  . Drug Use: No  . Sexually Active: Not on file   Other Topics Concern  . Not on file   Social History Narrative  . No narrative on file    Past Surgical History  Procedure Date  . Inguinal hernia repair   . Ptca   . Vasectomy   . Orif tibia & fibula fractures   . Angioplasty 1991  . Eye surgery 2010    cataract - right  . Cholecystectomy 2012  . Cardiac catheterization 2005    Moderate LCx and LAD disease and severe diffuse RCA disease  . Cardiac catheterization 01/2012    normal left main, 50% pLAD stenosis, dLAD mild luminal irregularities, D1 30-40% stenosis at take off; subtotally occluded/severely diseased OM1, mid 80% stenosis in small OM2, long prox and mid RCA stenoses ranging from 60-95%, severely diseased  PDA, distal PLSA occluded filling via L-R collateral; normal LV function; inferior lateral basal akinesis.    Family History  Problem Relation Age of Onset  . Asthma Mother   . Allergies Sister   . Coronary artery disease Other     Allergies  Allergen Reactions  . Meloxicam Other (See Comments)    Severe gastritis    Current Outpatient Prescriptions on File Prior to Visit  Medication Sig Dispense Refill  . albuterol (PROVENTIL HFA;VENTOLIN HFA) 108 (90 BASE) MCG/ACT inhaler Inhale 2 puffs into the lungs every 4 (four) hours as needed. For shortness of breath      . aspirin 81 MG EC tablet Take 1 tablet (81 mg total) by mouth daily. Swallow whole.  30 tablet  12  . atenolol (TENORMIN) 50 MG tablet Take 50 mg by mouth 2 (two) times daily.      . Cholecalciferol (VITAMIN D) 2000 UNITS CAPS Take 2,000 Units by mouth daily.       Marland Kitchen gabapentin (NEURONTIN) 300 MG capsule Take 300 mg by mouth 2 (two) times daily.       Marland Kitchen glucose blood (PRECISION XTRA TEST STRIPS) test strip 1 each by Other route daily. Use daily  100 each  6  . Hyoscyamine Sulfate (HYOMAX-DT) 0.375 MG TBCR Take 1-2 tablets  by mouth daily as needed. For irritable bowel syndrome      . isosorbide mononitrate (IMDUR) 30 MG 24 hr tablet Take 1 tablet (30 mg total) by mouth daily.  30 tablet  3  . levothyroxine (LEVOTHROID) 25 MCG tablet Take 1 tablet (25 mcg total) by mouth daily.  90 tablet  6  . metFORMIN (GLUCOPHAGE) 500 MG tablet Take 500 mg by mouth 2 (two) times daily with a meal.        . Multiple Vitamin (MULTIVITAMIN) tablet Take 1 tablet by mouth daily.        . nitroGLYCERIN (NITROSTAT) 0.4 MG SL tablet Place 1 tablet (0.4 mg total) under the tongue every 5 (five) minutes as needed. Chest pain  25 tablet  3  . nortriptyline (PAMELOR) 25 MG capsule Take 1 capsule (25 mg total) by mouth at bedtime.  90 capsule  4  . pantoprazole (PROTONIX) 40 MG tablet Take 40 mg by mouth daily. Take one half hour before eating.       .  ramipril (ALTACE) 5 MG capsule Take 1 capsule (5 mg total) by mouth daily.  90 capsule  3  . simvastatin (ZOCOR) 80 MG tablet Take 40 mg by mouth at bedtime.       Marland Kitchen zolpidem (AMBIEN) 5 MG tablet Take 1 tablet (5 mg total) by mouth at bedtime as needed for sleep.  30 tablet  0    BP 136/80  Pulse 64  Temp 97.5 F (36.4 C) (Oral)  Resp 18  Wt 225 lb (102.059 kg)       Review of Systems  Constitutional: Negative for fever, chills, appetite change and fatigue.  HENT: Negative for hearing loss, ear pain, congestion, sore throat, trouble swallowing, neck stiffness, dental problem, voice change and tinnitus.   Eyes: Negative for pain, discharge and visual disturbance.  Respiratory: Negative for cough, chest tightness, wheezing and stridor.   Cardiovascular: Negative for chest pain, palpitations and leg swelling.  Gastrointestinal: Negative for nausea, vomiting, abdominal pain, diarrhea, constipation, blood in stool and abdominal distention.  Genitourinary: Negative for urgency, hematuria, flank pain, discharge, difficulty urinating and genital sores.  Musculoskeletal: Negative for myalgias, back pain, joint swelling, arthralgias and gait problem.  Skin: Negative for rash.  Neurological: Negative for dizziness, syncope, speech difficulty, weakness, numbness and headaches.  Hematological: Negative for adenopathy. Does not bruise/bleed easily.  Psychiatric/Behavioral: Negative for behavioral problems and dysphoric mood. The patient is not nervous/anxious.        Objective:   Physical Exam  Constitutional: He is oriented to person, place, and time. He appears well-developed.  HENT:  Head: Normocephalic.  Right Ear: External ear normal.  Left Ear: External ear normal.  Eyes: Conjunctivae normal and EOM are normal.  Neck: Normal range of motion.  Cardiovascular: Normal rate and normal heart sounds.   Pulmonary/Chest: Breath sounds normal.  Abdominal: Bowel sounds are normal.    Musculoskeletal: Normal range of motion. He exhibits no edema and no tenderness.  Neurological: He is alert and oriented to person, place, and time.  Psychiatric: He has a normal mood and affect. His behavior is normal.          Assessment & Plan:   Coronary artery disease. Will continue cardiac rehabilitation aggressive risk factor modification and medical therapy Hypertension stable Type 2 diabetes. We'll check a hemoglobin A1c. We'll check a basic metabolic profile to assess renal status post catheterization  Recheck 3 months Weight loss encouraged

## 2012-04-28 ENCOUNTER — Encounter (HOSPITAL_COMMUNITY)
Admission: RE | Admit: 2012-04-28 | Discharge: 2012-04-28 | Disposition: A | Discharge status: Home / Self Care | Payer: Medicare Other | Source: Ambulatory Visit | Attending: Cardiology | Admitting: Cardiology

## 2012-04-30 ENCOUNTER — Encounter (HOSPITAL_COMMUNITY)
Admission: RE | Admit: 2012-04-30 | Discharge: 2012-04-30 | Disposition: A | Discharge status: Home / Self Care | Payer: Medicare Other | Source: Ambulatory Visit | Attending: Cardiology | Admitting: Cardiology

## 2012-05-03 ENCOUNTER — Encounter (HOSPITAL_COMMUNITY)
Admission: RE | Admit: 2012-05-03 | Discharge: 2012-05-03 | Disposition: A | Discharge status: Home / Self Care | Payer: Medicare Other | Source: Ambulatory Visit | Attending: Cardiology | Admitting: Cardiology

## 2012-05-05 ENCOUNTER — Encounter (HOSPITAL_COMMUNITY): Payer: Medicare Other

## 2012-05-10 ENCOUNTER — Encounter (HOSPITAL_COMMUNITY): Payer: Medicare Other

## 2012-05-10 ENCOUNTER — Ambulatory Visit (INDEPENDENT_AMBULATORY_CARE_PROVIDER_SITE_OTHER): Payer: Medicare Other | Admitting: Cardiology

## 2012-05-10 ENCOUNTER — Encounter: Payer: Self-pay | Admitting: Cardiology

## 2012-05-10 VITALS — BP 152/80 | HR 91 | Ht 71.0 in | Wt 221.8 lb

## 2012-05-10 DIAGNOSIS — I10 Essential (primary) hypertension: Secondary | ICD-10-CM

## 2012-05-10 DIAGNOSIS — I2510 Atherosclerotic heart disease of native coronary artery without angina pectoris: Secondary | ICD-10-CM

## 2012-05-10 DIAGNOSIS — E785 Hyperlipidemia, unspecified: Secondary | ICD-10-CM

## 2012-05-10 MED ORDER — NITROGLYCERIN 0.4 MG SL SUBL: 0.4000 mg | SUBLINGUAL_TABLET | SUBLINGUAL | Status: DC | PRN

## 2012-05-10 MED ORDER — ISOSORBIDE MONONITRATE ER 30 MG PO TB24: 30.0000 mg | ORAL_TABLET | Freq: Every day | ORAL | Status: DC

## 2012-05-10 NOTE — Patient Instructions (Addendum)
[facility] the [facility] to Dr Shirlee Latch at [phone].  Your physician wants you to follow-up in: 6 months with Dr Shirlee Latch. (June 2014).  You will receive a reminder letter in the mail two months in advance. If you don't receive a letter, please call our office to schedule the follow-up appointment.

## 2012-05-10 NOTE — Progress Notes (Signed)
Patient ID: Richard Ho, male   DOB: 27-Feb-1941, 71 y.o.   MRN: [id number] PCP: Dr. Amador Cunas  71 yo with history of CAD s/p admission in 8/13 with unstable angina presents for cardiology followup.  He has a history of angioplasty in [place] and in [place] in the 1990s (not sure which vessel).  Cath in 2005 showed severe, diffuse RCA system disease.  He was admitted in 8/13 with exertional chest pain and dyspnea.  He had a left heart cath showing significant disease in small OMs as well as severe diffuse RCA system disease, similar to the prior cath.  He was managed medically as there were not good interventional targets in the RCA system.  I started him on Imdur 30 mg daily.  Since that time, he has been feeling quite well.  He has had no further exertional chest pain or dyspnea.  He has been doing cardiac rehab which has been going well.  BP is high in the office again today but has been in the 100s-120s systolic when checked at cardiac rehab.   He did have an asthma attack last week while outside in the cold.  This manifested as wheezing and chest tightness and resolved with his albuterol inhaler.   Labs (8/13): K 4.1, creatinine 1.27, LDL 64, HDL 43  PMH: 1. CAD: Patient had angioplasty in [place] and in [place] in the 1990s, unsure what vessels were targeted.  He had a myoview with inferior ischemia in 2005 and had LHC showing severe diffuse RCA disease that was treated medically.  He presented to [facility] with symptoms consistent with unstable angina in 8/13.  LHC showed subtotal occlusion of OM1, small OM2 with 80% stenosis, 50% pLAD stenosis, 95% mRCA stenosis, severe diffuse PDA disease, occlusion of the PLV distally with left to right collaterals, EF normal with inferobasal hypokinesis.  Patient was medically managed.  Echo (9/13): EF 55-60%, mild LVH.  2. Asthma 3. GERD 4. HTN 5. Diabetes mellitus 6. Diabetic neuropathy  SH: Married, retired.  [place] veteran.  Nonsmoker.    FH: No  premature CAD  Current Outpatient Prescriptions  Medication Sig Dispense Refill  . albuterol (PROVENTIL HFA;VENTOLIN HFA) 108 (90 BASE) MCG/ACT inhaler Inhale 2 puffs into the lungs every 4 (four) hours as needed. For shortness of breath      . aspirin 81 MG EC tablet Take 1 tablet (81 mg total) by mouth daily. Swallow whole.  30 tablet  12  . atenolol (TENORMIN) 50 MG tablet Take 50 mg by mouth 2 (two) times daily.      . Cholecalciferol (VITAMIN D) 2000 UNITS CAPS Take 2,000 Units by mouth daily.       Marland Kitchen gabapentin (NEURONTIN) 300 MG capsule Take 300 mg by mouth 2 (two) times daily.       Marland Kitchen glucose blood (PRECISION XTRA TEST STRIPS) test strip 1 each by Other route daily. Use daily  100 each  6  . Hyoscyamine Sulfate (HYOMAX-DT) 0.375 MG TBCR Take 1-2 tablets by mouth daily as needed. For irritable bowel syndrome      . isosorbide mononitrate (IMDUR) 30 MG 24 hr tablet Take 1 tablet (30 mg total) by mouth daily.  30 tablet  3  . levothyroxine (LEVOTHROID) 25 MCG tablet Take 1 tablet (25 mcg total) by mouth daily.  90 tablet  6  . metFORMIN (GLUCOPHAGE) 500 MG tablet Take 500 mg by mouth 2 (two) times daily with a meal.        .  Multiple Vitamin (MULTIVITAMIN) tablet Take 1 tablet by mouth daily.        . nitroGLYCERIN (NITROSTAT) 0.4 MG SL tablet Place 1 tablet (0.4 mg total) under the tongue every 5 (five) minutes as needed. Chest pain  25 tablet  3  . nortriptyline (PAMELOR) 25 MG capsule Take 1 capsule (25 mg total) by mouth at bedtime.  90 capsule  4  . pantoprazole (PROTONIX) 40 MG tablet Take 40 mg by mouth daily. Take one half hour before eating.       . ramipril (ALTACE) 5 MG capsule Take 1 capsule (5 mg total) by mouth daily.  90 capsule  3  . simvastatin (ZOCOR) 80 MG tablet Take 40 mg by mouth at bedtime.       Marland Kitchen zolpidem (AMBIEN) 5 MG tablet Take 1 tablet (5 mg total) by mouth at bedtime as needed for sleep.  30 tablet  0    BP 152/80  Pulse 91  Ht 5\' 11"  (1.803 m)  Wt 221 lb  12.8 oz (100.608 kg)  BMI 30.93 kg/m2  SpO2 98% General: NAD, overweight Neck: No JVD, no thyromegaly or thyroid nodule.  Lungs: Clear to auscultation bilaterally with normal respiratory effort. CV: Nondisplaced PMI.  Heart regular S1/S2, no S3/S4, no murmur.  No peripheral edema.  No carotid bruit.  Normal pedal pulses.  Abdomen: Soft, nontender, no hepatosplenomegaly, no distention.  Skin: Intact without lesions or rashes.  Neurologic: Alert and oriented x 3.  Psych: Normal affect. Extremities: No clubbing or cyanosis.   Assessment/Plan  CORONARY ARTERY DISEASE Diffuse CAD on 8/13 cath, most marked in small to moderate OMs and throughout the RCA system. Echo with preserved EF in 9/13.  Plan for medical management of angina. He has been doing well since starting Imdur with no chest pain or significant exertional dyspnea. Continue ASA 81, ramipril, statin, atenolol, and Imdur at current doses.  Continue cardiac rehab.  HYPERLIPIDEMIA  Good LDL when last checked in 8/13, continue statin.   HYPERTENSION   BP is well-controlled at cardiac rehab.  It appears that he has a component of "white coat" hypertension.   Marca Ancona 05/10/2012 10:15 AM

## 2012-05-12 ENCOUNTER — Encounter (HOSPITAL_COMMUNITY)
Admission: RE | Admit: 2012-05-12 | Discharge: 2012-05-12 | Disposition: A | Discharge status: Home / Self Care | Payer: Medicare Other | Source: Ambulatory Visit | Attending: Cardiology | Admitting: Cardiology

## 2012-05-12 DIAGNOSIS — I2510 Atherosclerotic heart disease of native coronary artery without angina pectoris: Secondary | ICD-10-CM | POA: Insufficient documentation

## 2012-05-12 DIAGNOSIS — Z5189 Encounter for other specified aftercare: Secondary | ICD-10-CM | POA: Insufficient documentation

## 2012-05-12 NOTE — Progress Notes (Signed)
Pt seen at the [place].  Medication changed - Gabapentin decreased to 200 mg in the morning and 100 mg in the evening. Medication list reconciled.

## 2012-05-14 ENCOUNTER — Encounter (HOSPITAL_COMMUNITY)
Admission: RE | Admit: 2012-05-14 | Discharge: 2012-05-14 | Disposition: A | Discharge status: Home / Self Care | Payer: Medicare Other | Source: Ambulatory Visit | Attending: Cardiology | Admitting: Cardiology

## 2012-05-17 ENCOUNTER — Encounter (HOSPITAL_COMMUNITY)
Admission: RE | Admit: 2012-05-17 | Discharge: 2012-05-17 | Disposition: A | Discharge status: Home / Self Care | Payer: Medicare Other | Source: Ambulatory Visit | Attending: Cardiology | Admitting: Cardiology

## 2012-05-19 ENCOUNTER — Encounter (HOSPITAL_COMMUNITY)
Admission: RE | Admit: 2012-05-19 | Discharge: 2012-05-19 | Disposition: A | Discharge status: Home / Self Care | Payer: Medicare Other | Source: Ambulatory Visit | Attending: Cardiology | Admitting: Cardiology

## 2012-05-21 ENCOUNTER — Encounter (HOSPITAL_COMMUNITY): Payer: Medicare Other

## 2012-05-24 ENCOUNTER — Encounter (HOSPITAL_COMMUNITY)
Admission: RE | Admit: 2012-05-24 | Discharge: 2012-05-24 | Disposition: A | Discharge status: Home / Self Care | Payer: Medicare Other | Source: Ambulatory Visit | Attending: Cardiology | Admitting: Cardiology

## 2012-05-26 ENCOUNTER — Encounter (HOSPITAL_COMMUNITY)
Admission: RE | Admit: 2012-05-26 | Discharge: 2012-05-26 | Disposition: A | Discharge status: Home / Self Care | Payer: Medicare Other | Source: Ambulatory Visit | Attending: Cardiology | Admitting: Cardiology

## 2012-05-26 NOTE — Progress Notes (Signed)
Pt c/o mild chest tightness with exercise today. Pt states this is different than his typical angina and unlike his asthma symptoms. Pt reports he did not take medications prior to exercise today as he woke up later than usual and did not have time.  Pt reports tightness relieved with progressive activity.  Pt advised to take medications as prescribed prior to exercise especially isosorbide.  Pt also advised to notify MD if sx persist or worsen.  Proper use of NTG and when to present to ED reviewed.  Pt has appt with PCP at [facility] tomorrow.  Pt given rehab report to take with him to appt.  Understanding verbalized

## 2012-05-28 ENCOUNTER — Encounter (HOSPITAL_COMMUNITY)
Admission: RE | Admit: 2012-05-28 | Discharge: 2012-05-28 | Disposition: A | Discharge status: Home / Self Care | Payer: Medicare Other | Source: Ambulatory Visit | Attending: Cardiology | Admitting: Cardiology

## 2012-05-28 LAB — GLUCOSE, CAPILLARY: Glucose-Capillary: 126 mg/dL — ABNORMAL HIGH (ref 70–99)

## 2012-05-28 NOTE — Progress Notes (Signed)
Pt reports he was evaluated yesterday by PCP at [facility].  His metformin was decreased to 500mg  qpm.  Medication list reconciled.  Pt reports brief chest tightness with increased workload on treadmill today.  Only last momentarily, resolved spontaneously with continue exercise.  Pt will resume previous workload on treadmill.  Asymptomatic on nustep and bike.  Will continue to monitor.  Dr. Shirlee Latch made aware

## 2012-05-31 ENCOUNTER — Encounter (HOSPITAL_COMMUNITY)
Admission: RE | Admit: 2012-05-31 | Discharge: 2012-05-31 | Disposition: A | Discharge status: Home / Self Care | Payer: Medicare Other | Source: Ambulatory Visit | Attending: Cardiology | Admitting: Cardiology

## 2012-06-01 ENCOUNTER — Encounter (HOSPITAL_COMMUNITY): Payer: Self-pay

## 2012-06-04 ENCOUNTER — Encounter (HOSPITAL_COMMUNITY)
Admission: RE | Admit: 2012-06-04 | Discharge: 2012-06-04 | Disposition: A | Discharge status: Home / Self Care | Payer: Medicare Other | Source: Ambulatory Visit | Attending: Cardiology | Admitting: Cardiology

## 2012-06-04 NOTE — Progress Notes (Signed)
Pt in today for exercise session #35.  Pt with new onset of occasional pvc's during exercise.  Pt asymptomatic with no complaints.  Will in basket rehab report and strips for review by Dr. Shirlee Latch.

## 2012-06-07 ENCOUNTER — Encounter (HOSPITAL_COMMUNITY)
Admission: RE | Admit: 2012-06-07 | Discharge: 2012-06-07 | Disposition: A | Discharge status: Home / Self Care | Payer: Medicare Other | Source: Ambulatory Visit | Attending: Cardiology | Admitting: Cardiology

## 2012-06-07 NOTE — Progress Notes (Signed)
Pt graduated today from Cardiac Rehab Phase II with the completion of 36 exercise sessions.  Pt plans to continue exercise with home gym equipment and silver sneakers at the [facility].

## 2012-06-09 ENCOUNTER — Encounter (HOSPITAL_COMMUNITY): Payer: Medicare Other

## 2012-06-11 ENCOUNTER — Encounter (HOSPITAL_COMMUNITY): Payer: Medicare Other

## 2012-06-14 ENCOUNTER — Encounter (HOSPITAL_COMMUNITY): Payer: Medicare Other

## 2012-06-16 ENCOUNTER — Encounter (HOSPITAL_COMMUNITY): Payer: Medicare Other

## 2012-06-18 ENCOUNTER — Encounter (HOSPITAL_COMMUNITY): Payer: Medicare Other

## 2012-06-21 ENCOUNTER — Encounter (HOSPITAL_COMMUNITY): Payer: Medicare Other

## 2012-06-23 ENCOUNTER — Encounter (HOSPITAL_COMMUNITY): Payer: Medicare Other

## 2012-06-25 ENCOUNTER — Encounter (HOSPITAL_COMMUNITY): Payer: Medicare Other

## 2012-07-28 ENCOUNTER — Ambulatory Visit (INDEPENDENT_AMBULATORY_CARE_PROVIDER_SITE_OTHER): Payer: Medicare Other | Admitting: Internal Medicine

## 2012-07-28 ENCOUNTER — Encounter: Payer: Self-pay | Admitting: Internal Medicine

## 2012-07-28 VITALS — BP 140/80 | HR 76 | Temp 98.7°F | Resp 18 | Wt 227.0 lb

## 2012-07-28 DIAGNOSIS — E785 Hyperlipidemia, unspecified: Secondary | ICD-10-CM

## 2012-07-28 DIAGNOSIS — I2510 Atherosclerotic heart disease of native coronary artery without angina pectoris: Secondary | ICD-10-CM

## 2012-07-28 DIAGNOSIS — I10 Essential (primary) hypertension: Secondary | ICD-10-CM

## 2012-07-28 DIAGNOSIS — E119 Type 2 diabetes mellitus without complications: Secondary | ICD-10-CM

## 2012-07-28 MED ORDER — GABAPENTIN 600 MG PO TABS: 600.0000 mg | ORAL_TABLET | Freq: Three times a day (TID) | ORAL | Status: DC

## 2012-07-28 MED ORDER — TRAMADOL HCL 50 MG PO TABS: 50.0000 mg | ORAL_TABLET | Freq: Three times a day (TID) | ORAL | Status: DC | PRN

## 2012-07-28 MED ORDER — GABAPENTIN 300 MG PO CAPS: 300.0000 mg | ORAL_CAPSULE | Freq: Three times a day (TID) | ORAL | Status: DC

## 2012-07-28 NOTE — Patient Instructions (Signed)
Increase Neurontin to 900 mg 3 times daily  Tramadol as needed for pain   Please check your hemoglobin A1c every 3 months  Limit your sodium (Salt) intake  You need to lose weight.  Consider a lower calorie diet and regular exercise.

## 2012-07-28 NOTE — Progress Notes (Signed)
Subjective:    Patient ID: Richard Ho, male    DOB: 1940/08/14, 72 y.o.   MRN: [id number]  HPI   72 year old patient who has type 2 diabetes. History complaint today is worsening bilateral foot pain left greater than the right. He is already on Neurontin and nortriptyline at bedtime. He has coronary artery disease which has been stable. History of hypertension and dyslipidemia. He has had recent blood work done at the [facility]. Hemoglobin A1c 6.9 Denies any exertional chest pain. He  Past Medical History  Diagnosis Date  . Asthma   . Coronary artery disease 1991    a) Angioplasty LAD 1991 [place] b) MI in 1995 at  [facility] in [place], med Rx  . GERD (gastroesophageal reflux disease)   . Hyperlipidemia   . Myocardial infarct   . Nephrolithiasis   . Osteopenia   . HTN (hypertension)   . Diabetes mellitus   . Hearing loss     History   Social History  . Marital Status: Married    Spouse Name: N/A    Number of Children: N/A  . Years of Education: N/A   Occupational History  . Retired    Social History Main Topics  . Smoking status: Never Smoker   . Smokeless tobacco: Never Used  . Alcohol Use: No  . Drug Use: No  . Sexually Active: Not on file   Other Topics Concern  . Not on file   Social History Narrative  . No narrative on file    Past Surgical History  Procedure Laterality Date  . Inguinal hernia repair    . Ptca    . Vasectomy    . Orif tibia & fibula fractures    . Angioplasty  1991  . Eye surgery  2010    cataract - right  . Cholecystectomy  2012  . Cardiac catheterization  2005    Moderate LCx and LAD disease and severe diffuse RCA disease  . Cardiac catheterization  01/2012    normal left main, 50% pLAD stenosis, dLAD mild luminal irregularities, D1 30-40% stenosis at take off; subtotally occluded/severely diseased OM1, mid 80% stenosis in small OM2, long prox and mid RCA stenoses ranging from 60-95%, severely diseased PDA, distal  PLSA occluded filling via L-R collateral; normal LV function; inferior lateral basal akinesis.    Family History  Problem Relation Age of Onset  . Asthma Mother   . Allergies Sister   . Coronary artery disease Other     Allergies  Allergen Reactions  . Meloxicam Other (See Comments)    Severe gastritis    Current Outpatient Prescriptions on File Prior to Visit  Medication Sig Dispense Refill  . albuterol (PROVENTIL HFA;VENTOLIN HFA) 108 (90 BASE) MCG/ACT inhaler Inhale 2 puffs into the lungs every 4 (four) hours as needed. For shortness of breath      . aspirin 81 MG EC tablet Take 1 tablet (81 mg total) by mouth daily. Swallow whole.  30 tablet  12  . atenolol (TENORMIN) 50 MG tablet Take 50 mg by mouth 2 (two) times daily.      . Cholecalciferol (VITAMIN D) 2000 UNITS CAPS Take 2,000 Units by mouth daily.       Marland Kitchen gabapentin (NEURONTIN) 300 MG capsule Take 300 mg by mouth 2 (two) times daily. [facility] takes 300mg  qam and 900mg  qpm      . glucose blood (PRECISION XTRA TEST STRIPS) test strip 1 each by Other route daily.  Use daily  100 each  6  . Hyoscyamine Sulfate (HYOMAX-DT) 0.375 MG TBCR Take 1-2 tablets by mouth daily as needed. For irritable bowel syndrome      . isosorbide mononitrate (IMDUR) 30 MG 24 hr tablet Take 1 tablet (30 mg total) by mouth daily.  90 tablet  3  . metFORMIN (GLUCOPHAGE) 500 MG tablet Take 500 mg by mouth daily with supper.       . Multiple Vitamin (MULTIVITAMIN) tablet Take 1 tablet by mouth daily.        . nitroGLYCERIN (NITROSTAT) 0.4 MG SL tablet Place 1 tablet (0.4 mg total) under the tongue every 5 (five) minutes as needed. Chest pain  25 tablet  3  . nortriptyline (PAMELOR) 25 MG capsule Take 1 capsule (25 mg total) by mouth at bedtime.  90 capsule  4  . pantoprazole (PROTONIX) 40 MG tablet Take 40 mg by mouth daily. Take one half hour before eating.       . ramipril (ALTACE) 5 MG capsule Take 1 capsule (5 mg total) by mouth daily.  90 capsule  3  .  simvastatin (ZOCOR) 80 MG tablet Take 40 mg by mouth at bedtime.       Marland Kitchen levothyroxine (LEVOTHROID) 25 MCG tablet Take 1 tablet (25 mcg total) by mouth daily.  90 tablet  6  . zolpidem (AMBIEN) 5 MG tablet Take 1 tablet (5 mg total) by mouth at bedtime as needed for sleep.  30 tablet  0   No current facility-administered medications on file prior to visit.    BP 140/80  Pulse 76  Temp(Src) 98.7 F (37.1 C) (Oral)  Resp 18  Wt 227 lb (102.967 kg)  BMI 31.67 kg/m2       Review of Systems  Constitutional: Negative for fever, chills, appetite change and fatigue.  HENT: Negative for hearing loss, ear pain, congestion, sore throat, trouble swallowing, neck stiffness, dental problem, voice change and tinnitus.   Eyes: Negative for pain, discharge and visual disturbance.  Respiratory: Negative for cough, chest tightness, wheezing and stridor.   Cardiovascular: Negative for chest pain, palpitations and leg swelling.  Gastrointestinal: Negative for nausea, vomiting, abdominal pain, diarrhea, constipation, blood in stool and abdominal distention.  Genitourinary: Negative for urgency, hematuria, flank pain, discharge, difficulty urinating and genital sores.  Musculoskeletal: Negative for myalgias, back pain, joint swelling, arthralgias and gait problem.  Skin: Negative for rash.  Neurological: Positive for numbness. Negative for dizziness, syncope, speech difficulty, weakness and headaches.  Hematological: Negative for adenopathy. Does not bruise/bleed easily.  Psychiatric/Behavioral: Negative for behavioral problems and dysphoric mood. The patient is not nervous/anxious.        Objective:   Physical Exam  Constitutional: He is oriented to person, place, and time. He appears well-developed.  HENT:  Head: Normocephalic.  Right Ear: External ear normal.  Left Ear: External ear normal.  Eyes: Conjunctivae and EOM are normal.  Neck: Normal range of motion.  Cardiovascular: Normal rate,  normal heart sounds and intact distal pulses.   Pulmonary/Chest: Breath sounds normal.  Abdominal: Bowel sounds are normal.  Musculoskeletal: Normal range of motion. He exhibits no edema and no tenderness.  Neurological: He is alert and oriented to person, place, and time.  Psychiatric: He has a normal mood and affect. His behavior is normal.          Assessment & Plan:   Diabetes mellitus. Recent hemoglobin A1c 6.9. Continue metformin therapy Hypertension. Repeat blood pressure 140/80 Dyslipidemia Diabetic peripheral  neuropathy. Will give a prescription for tramadol increase Neurontin to 900 3 times a day  Recheck 3 months

## 2012-08-21 ENCOUNTER — Other Ambulatory Visit: Payer: Self-pay | Admitting: *Deleted

## 2012-08-21 MED ORDER — GLUCOSE BLOOD VI STRP: 1.0000 | ORAL_STRIP | Freq: Every day | Status: DC

## 2012-09-10 ENCOUNTER — Telehealth: Payer: Self-pay | Admitting: Cardiology

## 2012-09-10 NOTE — Telephone Encounter (Signed)
Records Mailed to  [facility] [address] [place] [facility]  09/10/12/km

## 2012-10-21 ENCOUNTER — Other Ambulatory Visit: Payer: Self-pay | Admitting: Internal Medicine

## 2012-10-27 ENCOUNTER — Ambulatory Visit (INDEPENDENT_AMBULATORY_CARE_PROVIDER_SITE_OTHER): Payer: Medicare Other | Admitting: Internal Medicine

## 2012-10-27 ENCOUNTER — Encounter: Payer: Self-pay | Admitting: Internal Medicine

## 2012-10-27 VITALS — BP 150/90 | HR 89 | Temp 97.5°F | Resp 20 | Wt 227.0 lb

## 2012-10-27 DIAGNOSIS — I2510 Atherosclerotic heart disease of native coronary artery without angina pectoris: Secondary | ICD-10-CM

## 2012-10-27 DIAGNOSIS — E785 Hyperlipidemia, unspecified: Secondary | ICD-10-CM

## 2012-10-27 DIAGNOSIS — J45901 Unspecified asthma with (acute) exacerbation: Secondary | ICD-10-CM

## 2012-10-27 DIAGNOSIS — E039 Hypothyroidism, unspecified: Secondary | ICD-10-CM

## 2012-10-27 DIAGNOSIS — E119 Type 2 diabetes mellitus without complications: Secondary | ICD-10-CM

## 2012-10-27 NOTE — Patient Instructions (Signed)
Limit your sodium (Salt) intake   Please check your hemoglobin A1c every 3 months    It is important that you exercise regularly, at least 20 minutes 3 to 4 times per week.  If you develop chest pain or shortness of breath seek  medical attention.  You need to lose weight.  Consider a lower calorie diet and regular exercise.  Acute bronchitis symptoms for less than 10 days are generally not helped by antibiotics.  Take over-the-counter expectorants and cough medications such as  Mucinex DM.  Call if there is no improvement in 5 to 7 days or if he developed worsening cough, fever, or new symptoms, such as shortness of breath or chest pain.

## 2012-10-27 NOTE — Progress Notes (Signed)
Subjective:    Patient ID: Richard Ho, male    DOB: 05-Aug-1940, 72 y.o.   MRN: [id number]  HPI  72 year old patient who is seen today for followup. He has history of mild asthma and has complained of increasing chest discomfort and some mild wheezing. He is using the albuterol. Otherwise done quite well. He seemed to be a hostile least twice annually. He does have a podiatry examination scheduled in July as well as ophthalmology. He states that his disability rating has recently been increased to 80% do to exposure to agent orange. A recent hemoglobin A1c of panel is up to 7.1 He is coronary artery disease hypertension and dyslipidemia. Denies any active cardiopulmonary complaints.  Past Medical History  Diagnosis Date  . Asthma   . Coronary artery disease 1991    a) Angioplasty LAD 1991 [place] b) MI in 1995 at  [facility] in [place], med Rx  . GERD (gastroesophageal reflux disease)   . Hyperlipidemia   . Myocardial infarct   . Nephrolithiasis   . Osteopenia   . HTN (hypertension)   . Diabetes mellitus   . Hearing loss     History   Social History  . Marital Status: Married    Spouse Name: N/A    Number of Children: N/A  . Years of Education: N/A   Occupational History  . Retired    Social History Main Topics  . Smoking status: Never Smoker   . Smokeless tobacco: Never Used  . Alcohol Use: No  . Drug Use: No  . Sexually Active: Not on file   Other Topics Concern  . Not on file   Social History Narrative  . No narrative on file    Past Surgical History  Procedure Laterality Date  . Inguinal hernia repair    . Ptca    . Vasectomy    . Orif tibia & fibula fractures    . Angioplasty  1991  . Eye surgery  2010    cataract - right  . Cholecystectomy  2012  . Cardiac catheterization  2005    Moderate LCx and LAD disease and severe diffuse RCA disease  . Cardiac catheterization  01/2012    normal left main, 50% pLAD stenosis, dLAD mild luminal  irregularities, D1 30-40% stenosis at take off; subtotally occluded/severely diseased OM1, mid 80% stenosis in small OM2, long prox and mid RCA stenoses ranging from 60-95%, severely diseased PDA, distal PLSA occluded filling via L-R collateral; normal LV function; inferior lateral basal akinesis.    Family History  Problem Relation Age of Onset  . Asthma Mother   . Allergies Sister   . Coronary artery disease Other     Allergies  Allergen Reactions  . Meloxicam Other (See Comments)    Severe gastritis    Current Outpatient Prescriptions on File Prior to Visit  Medication Sig Dispense Refill  . albuterol (PROVENTIL HFA;VENTOLIN HFA) 108 (90 BASE) MCG/ACT inhaler Inhale 2 puffs into the lungs every 4 (four) hours as needed. For shortness of breath      . aspirin 81 MG EC tablet Take 1 tablet (81 mg total) by mouth daily. Swallow whole.  30 tablet  12  . atenolol (TENORMIN) 50 MG tablet Take 50 mg by mouth 2 (two) times daily.      . Cholecalciferol (VITAMIN D) 2000 UNITS CAPS Take 2,000 Units by mouth daily.       Marland Kitchen gabapentin (NEURONTIN) 300 MG capsule Take 1 capsule (300  mg total) by mouth 3 (three) times daily. [facility] takes 300mg  qam and 900mg  qpm  90 capsule  6  . gabapentin (NEURONTIN) 600 MG tablet Take 1 tablet (600 mg total) by mouth 3 (three) times daily.  90 tablet  4  . glucose blood (PRECISION XTRA TEST STRIPS) test strip 1 each by Other route daily. Use daily  100 each  4  . Hyoscyamine Sulfate (HYOMAX-DT) 0.375 MG TBCR Take 1-2 tablets by mouth daily as needed. For irritable bowel syndrome      . isosorbide mononitrate (IMDUR) 30 MG 24 hr tablet Take 1 tablet (30 mg total) by mouth daily.  90 tablet  3  . metFORMIN (GLUCOPHAGE) 500 MG tablet Take 500 mg by mouth daily with supper.       . Multiple Vitamin (MULTIVITAMIN) tablet Take 1 tablet by mouth daily.        . nitroGLYCERIN (NITROSTAT) 0.4 MG SL tablet Place 1 tablet (0.4 mg total) under the tongue every 5 (five)  minutes as needed. Chest pain  25 tablet  3  . nortriptyline (PAMELOR) 25 MG capsule Take 1 capsule (25 mg total) by mouth at bedtime.  90 capsule  4  . pantoprazole (PROTONIX) 40 MG tablet Take 40 mg by mouth daily. Take one half hour before eating.       . ramipril (ALTACE) 5 MG capsule TAKE ONE CAPSULE BY MOUTH ONCE DAILY  90 capsule  0  . simvastatin (ZOCOR) 80 MG tablet Take 40 mg by mouth at bedtime.       . traMADol (ULTRAM) 50 MG tablet Take 1 tablet (50 mg total) by mouth every 8 (eight) hours as needed for pain.  30 tablet  0  . zolpidem (AMBIEN) 5 MG tablet Take 1 tablet (5 mg total) by mouth at bedtime as needed for sleep.  30 tablet  0  . levothyroxine (LEVOTHROID) 25 MCG tablet Take 1 tablet (25 mcg total) by mouth daily.  90 tablet  6   No current facility-administered medications on file prior to visit.    BP 150/90  Pulse 89  Temp(Src) 97.5 F (36.4 C) (Oral)  Resp 20  Wt 227 lb (102.967 kg)  BMI 31.67 kg/m2  SpO2 96%       Review of Systems  Constitutional: Negative for fever, chills, appetite change and fatigue.  HENT: Negative for hearing loss, ear pain, congestion, sore throat, trouble swallowing, neck stiffness, dental problem, voice change and tinnitus.   Eyes: Negative for pain, discharge and visual disturbance.  Respiratory: Positive for cough and wheezing. Negative for chest tightness and stridor.   Cardiovascular: Negative for chest pain, palpitations and leg swelling.  Gastrointestinal: Negative for nausea, vomiting, abdominal pain, diarrhea, constipation, blood in stool and abdominal distention.  Genitourinary: Negative for urgency, hematuria, flank pain, discharge, difficulty urinating and genital sores.  Musculoskeletal: Negative for myalgias, back pain, joint swelling, arthralgias and gait problem.  Skin: Negative for rash.  Neurological: Negative for dizziness, syncope, speech difficulty, weakness, numbness and headaches.  Hematological: Negative  for adenopathy. Does not bruise/bleed easily.  Psychiatric/Behavioral: Negative for behavioral problems and dysphoric mood. The patient is not nervous/anxious.        Objective:   Physical Exam  Constitutional: He is oriented to person, place, and time. He appears well-developed.  HENT:  Head: Normocephalic.  Right Ear: External ear normal.  Left Ear: External ear normal.  Eyes: Conjunctivae and EOM are normal.  Neck: Normal range of motion.  Cardiovascular: Normal rate and normal heart sounds.   Pulmonary/Chest: Breath sounds normal. He has no wheezes.  O2 saturation 96%  Abdominal: Bowel sounds are normal.  Musculoskeletal: Normal range of motion. He exhibits no edema and no tenderness.  Neurological: He is alert and oriented to person, place, and time.  Psychiatric: He has a normal mood and affect. His behavior is normal.          Assessment & Plan:   Asthma. Continue when necessary albuterol. Add Mucinex Hypertension stable. Continue home blood pressure monitoring. Repeat blood pressure 140/80 Diabetes mellitus. More exercise modest weight loss encouraged recheck 3 months Coronary artery disease stable

## 2012-11-22 ENCOUNTER — Ambulatory Visit (INDEPENDENT_AMBULATORY_CARE_PROVIDER_SITE_OTHER): Payer: Medicare Other | Admitting: Internal Medicine

## 2012-11-22 ENCOUNTER — Encounter: Payer: Self-pay | Admitting: Internal Medicine

## 2012-11-22 VITALS — BP 142/80 | HR 88 | Temp 98.8°F | Resp 20 | Wt 224.0 lb

## 2012-11-22 DIAGNOSIS — I2510 Atherosclerotic heart disease of native coronary artery without angina pectoris: Secondary | ICD-10-CM

## 2012-11-22 DIAGNOSIS — E119 Type 2 diabetes mellitus without complications: Secondary | ICD-10-CM

## 2012-11-22 DIAGNOSIS — I10 Essential (primary) hypertension: Secondary | ICD-10-CM

## 2012-11-22 DIAGNOSIS — I951 Orthostatic hypotension: Secondary | ICD-10-CM

## 2012-11-22 LAB — CBC WITH DIFFERENTIAL/PLATELET
Basophils Relative: 0.3 % (ref 0.0–3.0)
Eosinophils Absolute: 0.2 10*3/uL (ref 0.0–0.7)
Eosinophils Relative: 2.1 % (ref 0.0–5.0)
Hemoglobin: 13.3 g/dL (ref 13.0–17.0)
Lymphocytes Relative: 27.7 % (ref 12.0–46.0)
MCHC: 33.5 g/dL (ref 30.0–36.0)
MCV: 102.6 fl — ABNORMAL HIGH (ref 78.0–100.0)
Neutro Abs: 4.7 10*3/uL (ref 1.4–7.7)
Neutrophils Relative %: 61.3 % (ref 43.0–77.0)
RBC: 3.87 Mil/uL — ABNORMAL LOW (ref 4.22–5.81)
WBC: 7.6 10*3/uL (ref 4.5–10.5)

## 2012-11-22 LAB — COMPREHENSIVE METABOLIC PANEL
AST: 29 U/L (ref 0–37)
Alkaline Phosphatase: 75 U/L (ref 39–117)
BUN: 22 mg/dL (ref 6–23)
Calcium: 10 mg/dL (ref 8.4–10.5)
Creatinine, Ser: 1.7 mg/dL — ABNORMAL HIGH (ref 0.4–1.5)
Glucose, Bld: 99 mg/dL (ref 70–99)

## 2012-11-22 MED ORDER — ATENOLOL 50 MG PO TABS: 25.0000 mg | ORAL_TABLET | Freq: Two times a day (BID) | ORAL | Status: DC

## 2012-11-22 NOTE — Progress Notes (Signed)
Subjective:    Patient ID: Richard Ho, male    DOB: February 28, 1941, 72 y.o.   MRN: [id number]  HPI  72 year old patient who has a history of hypertension coronary artery disease and prior MI. He has history of asthma hypothyroidism and macrocytic anemia. He was stable until yesterday when he had 3 episodes of orthostatic dizziness. He force fluids in today he feels like he is back to baseline. Medical regimen includes Imdur Altace and atenolol. He has diabetes but no recent hemoglobin A 1C Today he feels like he is back to baseline  Past Medical History  Diagnosis Date  . Asthma   . Coronary artery disease 1991    a) Angioplasty LAD 1991 [place] b) MI in 1995 at  [facility] in [place], med Rx  . GERD (gastroesophageal reflux disease)   . Hyperlipidemia   . Myocardial infarct   . Nephrolithiasis   . Osteopenia   . HTN (hypertension)   . Diabetes mellitus   . Hearing loss     History   Social History  . Marital Status: Married    Spouse Name: N/A    Number of Children: N/A  . Years of Education: N/A   Occupational History  . Retired    Social History Main Topics  . Smoking status: Never Smoker   . Smokeless tobacco: Never Used  . Alcohol Use: No  . Drug Use: No  . Sexually Active: Not on file   Other Topics Concern  . Not on file   Social History Narrative  . No narrative on file    Past Surgical History  Procedure Laterality Date  . Inguinal hernia repair    . Ptca    . Vasectomy    . Orif tibia & fibula fractures    . Angioplasty  1991  . Eye surgery  2010    cataract - right  . Cholecystectomy  2012  . Cardiac catheterization  2005    Moderate LCx and LAD disease and severe diffuse RCA disease  . Cardiac catheterization  01/2012    normal left main, 50% pLAD stenosis, dLAD mild luminal irregularities, D1 30-40% stenosis at take off; subtotally occluded/severely diseased OM1, mid 80% stenosis in small OM2, long prox and mid RCA stenoses ranging  from 60-95%, severely diseased PDA, distal PLSA occluded filling via L-R collateral; normal LV function; inferior lateral basal akinesis.    Family History  Problem Relation Age of Onset  . Asthma Mother   . Allergies Sister   . Coronary artery disease Other     Allergies  Allergen Reactions  . Meloxicam Other (See Comments)    Severe gastritis    Current Outpatient Prescriptions on File Prior to Visit  Medication Sig Dispense Refill  . albuterol (PROVENTIL HFA;VENTOLIN HFA) 108 (90 BASE) MCG/ACT inhaler Inhale 2 puffs into the lungs every 4 (four) hours as needed. For shortness of breath      . aspirin 81 MG EC tablet Take 1 tablet (81 mg total) by mouth daily. Swallow whole.  30 tablet  12  . atenolol (TENORMIN) 50 MG tablet Take 50 mg by mouth 2 (two) times daily.      . Cholecalciferol (VITAMIN D) 2000 UNITS CAPS Take 2,000 Units by mouth daily.       Marland Kitchen gabapentin (NEURONTIN) 300 MG capsule Take 1 capsule (300 mg total) by mouth 3 (three) times daily. [facility] takes 300mg  qam and 900mg  qpm  90 capsule  6  .  gabapentin (NEURONTIN) 600 MG tablet Take 1 tablet (600 mg total) by mouth 3 (three) times daily.  90 tablet  4  . glucose blood (PRECISION XTRA TEST STRIPS) test strip 1 each by Other route daily. Use daily  100 each  4  . Hyoscyamine Sulfate (HYOMAX-DT) 0.375 MG TBCR Take 1-2 tablets by mouth daily as needed. For irritable bowel syndrome      . isosorbide mononitrate (IMDUR) 30 MG 24 hr tablet Take 1 tablet (30 mg total) by mouth daily.  90 tablet  3  . metFORMIN (GLUCOPHAGE) 500 MG tablet Take 500 mg by mouth daily with supper.       . Multiple Vitamin (MULTIVITAMIN) tablet Take 1 tablet by mouth daily.        . nitroGLYCERIN (NITROSTAT) 0.4 MG SL tablet Place 1 tablet (0.4 mg total) under the tongue every 5 (five) minutes as needed. Chest pain  25 tablet  3  . nortriptyline (PAMELOR) 25 MG capsule Take 1 capsule (25 mg total) by mouth at bedtime.  90 capsule  4  . pantoprazole  (PROTONIX) 40 MG tablet Take 40 mg by mouth daily. Take one half hour before eating.       . ramipril (ALTACE) 5 MG capsule TAKE ONE CAPSULE BY MOUTH ONCE DAILY  90 capsule  0  . simvastatin (ZOCOR) 80 MG tablet Take 40 mg by mouth at bedtime.       . terbinafine (LAMISIL) 250 MG tablet Take 250 mg by mouth daily.      . traMADol (ULTRAM) 50 MG tablet Take 1 tablet (50 mg total) by mouth every 8 (eight) hours as needed for pain.  30 tablet  0  . levothyroxine (LEVOTHROID) 25 MCG tablet Take 1 tablet (25 mcg total) by mouth daily.  90 tablet  6  . zolpidem (AMBIEN) 5 MG tablet Take 1 tablet (5 mg total) by mouth at bedtime as needed for sleep.  30 tablet  0   No current facility-administered medications on file prior to visit.    BP 142/80  Pulse 88  Temp(Src) 98.8 F (37.1 C) (Oral)  Resp 20  Wt 224 lb (101.606 kg)  BMI 31.26 kg/m2  SpO2 97%       Review of Systems  Constitutional: Negative for fever, chills, appetite change and fatigue.  HENT: Negative for hearing loss, ear pain, congestion, sore throat, trouble swallowing, neck stiffness, dental problem, voice change and tinnitus.   Eyes: Negative for pain, discharge and visual disturbance.  Respiratory: Negative for cough, chest tightness, wheezing and stridor.   Cardiovascular: Negative for chest pain, palpitations and leg swelling.  Gastrointestinal: Negative for nausea, vomiting, abdominal pain, diarrhea, constipation, blood in stool and abdominal distention.  Genitourinary: Negative for urgency, hematuria, flank pain, discharge, difficulty urinating and genital sores.  Musculoskeletal: Negative for myalgias, back pain, joint swelling, arthralgias and gait problem.  Skin: Negative for rash.  Neurological: Positive for light-headedness. Negative for dizziness, syncope, speech difficulty, weakness, numbness and headaches.  Hematological: Negative for adenopathy. Does not bruise/bleed easily.  Psychiatric/Behavioral: Negative  for behavioral problems and dysphoric mood. The patient is not nervous/anxious.        Objective:   Physical Exam  Constitutional: He is oriented to person, place, and time. He appears well-developed.  Blood pressure 130/70 sitting 100/64 standing  HENT:  Head: Normocephalic.  Right Ear: External ear normal.  Left Ear: External ear normal.  Eyes: Conjunctivae and EOM are normal.  Neck: Normal range of motion.  Cardiovascular: Normal rate and normal heart sounds.   Pulmonary/Chest: Breath sounds normal.  Abdominal: Bowel sounds are normal.  Musculoskeletal: Normal range of motion. He exhibits no edema and no tenderness.  Neurological: He is alert and oriented to person, place, and time.  Psychiatric: He has a normal mood and affect. His behavior is normal.          Assessment & Plan:   Orthostatic hypotension. We'll decrease atenolol to 25 mg daily and clinically observed Coronary artery disease Diabetes. We'll check a hemoglobin A 1C

## 2012-11-22 NOTE — Patient Instructions (Signed)
Decrease atenolol to 25 mg twice daily  Please check your blood pressure on a regular basis.  If it is consistently greater than 150/90, please make an office appointment.  Return in one month for follow-up

## 2012-12-20 ENCOUNTER — Ambulatory Visit: Payer: Medicare Other | Admitting: Internal Medicine

## 2012-12-21 ENCOUNTER — Ambulatory Visit (INDEPENDENT_AMBULATORY_CARE_PROVIDER_SITE_OTHER): Payer: Medicare Other | Admitting: Internal Medicine

## 2012-12-21 ENCOUNTER — Encounter: Payer: Self-pay | Admitting: Internal Medicine

## 2012-12-21 VITALS — BP 160/90 | HR 92 | Temp 98.5°F | Resp 20 | Wt 229.0 lb

## 2012-12-21 DIAGNOSIS — E119 Type 2 diabetes mellitus without complications: Secondary | ICD-10-CM

## 2012-12-21 DIAGNOSIS — I10 Essential (primary) hypertension: Secondary | ICD-10-CM

## 2012-12-21 MED ORDER — METFORMIN HCL 1000 MG PO TABS: 1000.0000 mg | ORAL_TABLET | Freq: Two times a day (BID) | ORAL | Status: DC

## 2012-12-21 NOTE — Patient Instructions (Signed)
Limit your sodium (Salt) intake  Please check your blood pressure on a regular basis.  If it is consistently greater than 150/90, please make an office appointment.     It is important that you exercise regularly, at least 20 minutes 3 to 4 times per week.  If you develop chest pain or shortness of breath seek  medical attention. 

## 2012-12-21 NOTE — Progress Notes (Signed)
Subjective:    Patient ID: Richard Ho, male    DOB: Oct 07, 1940, 72 y.o.   MRN: [id number]  HPI  72 year old patient who has coronary artery disease and hypertension. He was seen one month ago with some orthostatic symptoms and atenolol was decreased to 25 mg twice a day. He continues to monitor blood pressures at home carefully and earlier this morning was 122/82.  He states that his orthostatic symptoms are 90% improved.  Hemoglobin A1c 6.9 one month ago. Diabetic medications include metformin 500 twice a day.  Past Medical History  Diagnosis Date  . Asthma   . Coronary artery disease 1991    a) Angioplasty LAD 1991 [place] b) MI in 1995 at  [facility] in [place], med Rx  . GERD (gastroesophageal reflux disease)   . Hyperlipidemia   . Myocardial infarct   . Nephrolithiasis   . Osteopenia   . HTN (hypertension)   . Diabetes mellitus   . Hearing loss     History   Social History  . Marital Status: Married    Spouse Name: N/A    Number of Children: N/A  . Years of Education: N/A   Occupational History  . Retired    Social History Main Topics  . Smoking status: Never Smoker   . Smokeless tobacco: Never Used  . Alcohol Use: No  . Drug Use: No  . Sexually Active: Not on file   Other Topics Concern  . Not on file   Social History Narrative  . No narrative on file    Past Surgical History  Procedure Laterality Date  . Inguinal hernia repair    . Ptca    . Vasectomy    . Orif tibia & fibula fractures    . Angioplasty  1991  . Eye surgery  2010    cataract - right  . Cholecystectomy  2012  . Cardiac catheterization  2005    Moderate LCx and LAD disease and severe diffuse RCA disease  . Cardiac catheterization  01/2012    normal left main, 50% pLAD stenosis, dLAD mild luminal irregularities, D1 30-40% stenosis at take off; subtotally occluded/severely diseased OM1, mid 80% stenosis in small OM2, long prox and mid RCA stenoses ranging from 60-95%,  severely diseased PDA, distal PLSA occluded filling via L-R collateral; normal LV function; inferior lateral basal akinesis.    Family History  Problem Relation Age of Onset  . Asthma Mother   . Allergies Sister   . Coronary artery disease Other     Allergies  Allergen Reactions  . Meloxicam Other (See Comments)    Severe gastritis    Current Outpatient Prescriptions on File Prior to Visit  Medication Sig Dispense Refill  . albuterol (PROVENTIL HFA;VENTOLIN HFA) 108 (90 BASE) MCG/ACT inhaler Inhale 2 puffs into the lungs every 4 (four) hours as needed. For shortness of breath      . aspirin 81 MG EC tablet Take 1 tablet (81 mg total) by mouth daily. Swallow whole.  30 tablet  12  . atenolol (TENORMIN) 50 MG tablet Take 0.5 tablets (25 mg total) by mouth 2 (two) times daily.  90 tablet  4  . Cholecalciferol (VITAMIN D) 2000 UNITS CAPS Take 2,000 Units by mouth daily.       Marland Kitchen gabapentin (NEURONTIN) 300 MG capsule Take 1 capsule (300 mg total) by mouth 3 (three) times daily. [facility] takes 300mg  qam and 900mg  qpm  90 capsule  6  . gabapentin (  NEURONTIN) 600 MG tablet Take 1 tablet (600 mg total) by mouth 3 (three) times daily.  90 tablet  4  . glucose blood (PRECISION XTRA TEST STRIPS) test strip 1 each by Other route daily. Use daily  100 each  4  . Hyoscyamine Sulfate (HYOMAX-DT) 0.375 MG TBCR Take 1-2 tablets by mouth daily as needed. For irritable bowel syndrome      . isosorbide mononitrate (IMDUR) 30 MG 24 hr tablet Take 1 tablet (30 mg total) by mouth daily.  90 tablet  3  . metFORMIN (GLUCOPHAGE) 500 MG tablet Take 500 mg by mouth daily with supper.       . Multiple Vitamin (MULTIVITAMIN) tablet Take 1 tablet by mouth daily.        . nitroGLYCERIN (NITROSTAT) 0.4 MG SL tablet Place 1 tablet (0.4 mg total) under the tongue every 5 (five) minutes as needed. Chest pain  25 tablet  3  . nortriptyline (PAMELOR) 25 MG capsule Take 1 capsule (25 mg total) by mouth at bedtime.  90 capsule  4   . pantoprazole (PROTONIX) 40 MG tablet Take 40 mg by mouth daily. Take one half hour before eating.       . ramipril (ALTACE) 5 MG capsule TAKE ONE CAPSULE BY MOUTH ONCE DAILY  90 capsule  0  . simvastatin (ZOCOR) 80 MG tablet Take 40 mg by mouth at bedtime.       . traMADol (ULTRAM) 50 MG tablet Take 1 tablet (50 mg total) by mouth every 8 (eight) hours as needed for pain.  30 tablet  0  . zolpidem (AMBIEN) 5 MG tablet Take 1 tablet (5 mg total) by mouth at bedtime as needed for sleep.  30 tablet  0  . levothyroxine (LEVOTHROID) 25 MCG tablet Take 1 tablet (25 mcg total) by mouth daily.  90 tablet  6   No current facility-administered medications on file prior to visit.    BP 160/90  Pulse 92  Temp(Src) 98.5 F (36.9 C) (Oral)  Resp 20  Wt 229 lb (103.874 kg)  BMI 31.95 kg/m2  SpO2 96%       Review of Systems  Constitutional: Negative for fever, chills, appetite change and fatigue.  HENT: Negative for hearing loss, ear pain, congestion, sore throat, trouble swallowing, neck stiffness, dental problem, voice change and tinnitus.   Eyes: Negative for pain, discharge and visual disturbance.  Respiratory: Negative for cough, chest tightness, wheezing and stridor.   Cardiovascular: Negative for chest pain, palpitations and leg swelling.  Gastrointestinal: Negative for nausea, vomiting, abdominal pain, diarrhea, constipation, blood in stool and abdominal distention.  Genitourinary: Negative for urgency, hematuria, flank pain, discharge, difficulty urinating and genital sores.  Musculoskeletal: Negative for myalgias, back pain, joint swelling, arthralgias and gait problem.  Skin: Negative for rash.  Neurological: Positive for dizziness. Negative for syncope, speech difficulty, weakness, numbness and headaches.  Hematological: Negative for adenopathy. Does not bruise/bleed easily.  Psychiatric/Behavioral: Negative for behavioral problems and dysphoric mood. The patient is not  nervous/anxious.        Objective:   Physical Exam  Constitutional: He appears well-developed and well-nourished. No distress.  Blood pressure 140/90          Assessment & Plan:   Hypertension. We'll continue home blood pressure monitoring. Medical regimen unchanged Diabetes. We'll increase metformin to 1 g twice a day  Recheck 3 months

## 2013-01-27 ENCOUNTER — Ambulatory Visit: Payer: Medicare Other | Admitting: Internal Medicine

## 2013-01-31 ENCOUNTER — Other Ambulatory Visit: Payer: Self-pay | Admitting: *Deleted

## 2013-01-31 MED ORDER — RAMIPRIL 5 MG PO CAPS: ORAL_CAPSULE | ORAL | Status: DC

## 2013-02-21 ENCOUNTER — Ambulatory Visit (INDEPENDENT_AMBULATORY_CARE_PROVIDER_SITE_OTHER): Payer: Medicare Other | Admitting: Internal Medicine

## 2013-02-21 ENCOUNTER — Encounter: Payer: Self-pay | Admitting: Internal Medicine

## 2013-02-21 VITALS — BP 142/82 | HR 88 | Temp 98.2°F | Resp 20 | Wt 227.0 lb

## 2013-02-21 DIAGNOSIS — I2510 Atherosclerotic heart disease of native coronary artery without angina pectoris: Secondary | ICD-10-CM

## 2013-02-21 DIAGNOSIS — Z23 Encounter for immunization: Secondary | ICD-10-CM

## 2013-02-21 DIAGNOSIS — E785 Hyperlipidemia, unspecified: Secondary | ICD-10-CM

## 2013-02-21 DIAGNOSIS — E119 Type 2 diabetes mellitus without complications: Secondary | ICD-10-CM

## 2013-02-21 DIAGNOSIS — I10 Essential (primary) hypertension: Secondary | ICD-10-CM

## 2013-02-21 LAB — BASIC METABOLIC PANEL
BUN: 17 mg/dL (ref 6–23)
CO2: 30 mEq/L (ref 19–32)
Calcium: 9.7 mg/dL (ref 8.4–10.5)
Creatinine, Ser: 1.4 mg/dL (ref 0.4–1.5)
GFR: 54.28 mL/min — ABNORMAL LOW (ref >60.00)
Glucose, Bld: 100 mg/dL — ABNORMAL HIGH (ref 70–99)
Sodium: 138 mEq/L (ref 135–145)

## 2013-02-21 LAB — LIPID PANEL
Cholesterol: 139 mg/dL (ref 0–200)
HDL: 39.5 mg/dL (ref >39.00)
Triglycerides: 150 mg/dL — ABNORMAL HIGH (ref 0.0–149.0)
VLDL: 30 mg/dL (ref 0.0–40.0)

## 2013-02-21 LAB — MICROALBUMIN / CREATININE URINE RATIO
Creatinine,U: 173.2 mg/dL
Microalb Creat Ratio: 0.8 mg/g (ref 0.0–30.0)

## 2013-02-21 NOTE — Progress Notes (Signed)
Subjective:    Patient ID: Richard Ho, male    DOB: Feb 26, 1941, 72 y.o.   MRN: [id number]  HPI  72 year old patient who is seen today for his quarterly followup. He has coronary artery disease which has been stable. Denies any exertional chest pain. He has hypertension. Due to orthostatic symptoms blood pressure medications down titrated earlier in the year. Blood pressure today was as high as 150/90 Blood sugars generally have done well with readings occasionally less than 100. He is on metformin therapy only Laboratory studies were recently obtained 3 months ago. Creatinine was up to 1.7. We'll recheck today He has a history of asthma which has been stable. Is also followed at [facility]. He is planning on a high exam next week.  Past Medical History  Diagnosis Date  . Asthma   . Coronary artery disease 1991    a) Angioplasty LAD 1991 [place] b) MI in 1995 at  [facility] in [place], med Rx  . GERD (gastroesophageal reflux disease)   . Hyperlipidemia   . Myocardial infarct   . Nephrolithiasis   . Osteopenia   . HTN (hypertension)   . Diabetes mellitus   . Hearing loss     History   Social History  . Marital Status: Married    Spouse Name: N/A    Number of Children: N/A  . Years of Education: N/A   Occupational History  . Retired    Social History Main Topics  . Smoking status: Never Smoker   . Smokeless tobacco: Never Used  . Alcohol Use: No  . Drug Use: No  . Sexual Activity: Not on file   Other Topics Concern  . Not on file   Social History Narrative  . No narrative on file    Past Surgical History  Procedure Laterality Date  . Inguinal hernia repair    . Ptca    . Vasectomy    . Orif tibia & fibula fractures    . Angioplasty  1991  . Eye surgery  2010    cataract - right  . Cholecystectomy  2012  . Cardiac catheterization  2005    Moderate LCx and LAD disease and severe diffuse RCA disease  . Cardiac catheterization  01/2012    normal  left main, 50% pLAD stenosis, dLAD mild luminal irregularities, D1 30-40% stenosis at take off; subtotally occluded/severely diseased OM1, mid 80% stenosis in small OM2, long prox and mid RCA stenoses ranging from 60-95%, severely diseased PDA, distal PLSA occluded filling via L-R collateral; normal LV function; inferior lateral basal akinesis.    Family History  Problem Relation Age of Onset  . Asthma Mother   . Allergies Sister   . Coronary artery disease Other     Allergies  Allergen Reactions  . Meloxicam Other (See Comments)    Severe gastritis    Current Outpatient Prescriptions on File Prior to Visit  Medication Sig Dispense Refill  . albuterol (PROVENTIL HFA;VENTOLIN HFA) 108 (90 BASE) MCG/ACT inhaler Inhale 2 puffs into the lungs every 4 (four) hours as needed. For shortness of breath      . atenolol (TENORMIN) 50 MG tablet Take 0.5 tablets (25 mg total) by mouth 2 (two) times daily.  90 tablet  4  . Cholecalciferol (VITAMIN D) 2000 UNITS CAPS Take 2,000 Units by mouth daily.       Marland Kitchen gabapentin (NEURONTIN) 300 MG capsule Take 1 capsule (300 mg total) by mouth 3 (three) times daily. [facility] takes 300mg  qam and 900mg  qpm  90 capsule  6  . gabapentin (NEURONTIN) 600 MG tablet Take 1 tablet (600 mg total) by mouth 3 (three) times daily.  90 tablet  4  . glucose blood (PRECISION XTRA TEST STRIPS) test strip 1 each by Other route daily. Use daily  100 each  4  . Hyoscyamine Sulfate (HYOMAX-DT) 0.375 MG TBCR Take 1-2 tablets by mouth daily as needed. For irritable bowel syndrome      . isosorbide mononitrate (IMDUR) 30 MG 24 hr tablet Take 1 tablet (30 mg total) by mouth daily.  90 tablet  3  . metFORMIN (GLUCOPHAGE) 1000 MG tablet Take 1 tablet (1,000 mg total) by mouth 2 (two) times daily with a meal.  180 tablet  3  . Multiple Vitamin (MULTIVITAMIN) tablet Take 1 tablet by mouth daily.        . nitroGLYCERIN (NITROSTAT) 0.4 MG SL tablet Place 1 tablet (0.4 mg total) under the tongue  every 5 (five) minutes as needed. Chest pain  25 tablet  3  . pantoprazole (PROTONIX) 40 MG tablet Take 40 mg by mouth daily. Take one half hour before eating.       . ramipril (ALTACE) 5 MG capsule TAKE ONE CAPSULE BY MOUTH ONCE DAILY  90 capsule  3  . simvastatin (ZOCOR) 80 MG tablet Take 40 mg by mouth at bedtime.       . traMADol (ULTRAM) 50 MG tablet Take 1 tablet (50 mg total) by mouth every 8 (eight) hours as needed for pain.  30 tablet  0  . levothyroxine (LEVOTHROID) 25 MCG tablet Take 1 tablet (25 mcg total) by mouth daily.  90 tablet  6  . nortriptyline (PAMELOR) 25 MG capsule Take 1 capsule (25 mg total) by mouth at bedtime.  90 capsule  4   No current facility-administered medications on file prior to visit.    BP 142/82  Pulse 88  Temp(Src) 98.2 F (36.8 C) (Oral)  Resp 20  Wt 227 lb (102.967 kg)  BMI 31.67 kg/m2  SpO2 97%       Review of Systems  Constitutional: Negative for fever, chills, appetite change and fatigue.  HENT: Negative for hearing loss, ear pain, congestion, sore throat, trouble swallowing, neck stiffness, dental problem, voice change and tinnitus.   Eyes: Positive for redness. Negative for pain, discharge and visual disturbance.  Respiratory: Negative for cough, chest tightness, wheezing and stridor.   Cardiovascular: Positive for palpitations. Negative for chest pain and leg swelling.  Gastrointestinal: Negative for nausea, vomiting, abdominal pain, diarrhea, constipation, blood in stool and abdominal distention.  Genitourinary: Positive for decreased urine volume. Negative for urgency, hematuria, flank pain, discharge, difficulty urinating and genital sores.  Musculoskeletal: Negative for myalgias, back pain, joint swelling, arthralgias and gait problem.  Skin: Negative for rash.  Neurological: Negative for dizziness, syncope, speech difficulty, weakness, numbness and headaches.  Hematological: Negative for adenopathy. Does not bruise/bleed easily.   Psychiatric/Behavioral: Negative for behavioral problems and dysphoric mood. The patient is not nervous/anxious.        Objective:   Physical Exam  Constitutional: He is oriented to person, place, and time. He appears well-developed.  HENT:  Head: Normocephalic.  Right Ear: External ear normal.  Left Ear: External ear normal.  Eyes: Conjunctivae and EOM are normal.  Neck: Normal range of motion.  Cardiovascular: Normal rate, normal heart sounds and intact distal pulses.   Pedal pulses full  Pulmonary/Chest: Breath sounds normal.  Abdominal: Bowel sounds are normal.  Musculoskeletal: Normal range of motion. He exhibits no edema and no tenderness.  Neurological: He is alert and oriented to person, place, and time.  Psychiatric: He has a normal mood and affect. His behavior is normal.          Assessment & Plan:   Hypertension. We'll continue to track home blood pressure readings. Will consider up titration of atenolol if blood pressure remains greater the 130/80 Diabetes mellitus. Will check a hemoglobin A1c Elevated creatinine at 1.7. Will repeat today CAD stable

## 2013-02-21 NOTE — Patient Instructions (Signed)
Limit your sodium (Salt) intake    It is important that you exercise regularly, at least 20 minutes 3 to 4 times per week.  If you develop chest pain or shortness of breath seek  medical attention.   Please check your hemoglobin A1c every 3 months  Please check your blood pressure on a regular basis.  If it is consistently greater than 150/90, please make an office appointment.  

## 2013-03-03 ENCOUNTER — Encounter: Payer: Self-pay | Admitting: Internal Medicine

## 2013-03-03 ENCOUNTER — Telehealth: Payer: Self-pay | Admitting: Internal Medicine

## 2013-03-03 ENCOUNTER — Ambulatory Visit (INDEPENDENT_AMBULATORY_CARE_PROVIDER_SITE_OTHER): Payer: Medicare Other | Admitting: Internal Medicine

## 2013-03-03 VITALS — BP 132/70 | HR 91 | Temp 98.4°F | Resp 20 | Wt 225.0 lb

## 2013-03-03 DIAGNOSIS — I10 Essential (primary) hypertension: Secondary | ICD-10-CM

## 2013-03-03 NOTE — Telephone Encounter (Signed)
Patient Information:  Caller Name: Steen  Phone: [phone]  Patient: Richard Ho, Richard Ho  Gender: Male  DOB: 12/18/1940  Age: 72 Years  PCP: Eleonore Chiquito (Family Practice > 60yrs old)  Office Follow Up:  Does the office need to follow up with this patient?: No  Instructions For The Office: N/A  RN Note:  Pt has new BP cuff, Pt has elevated reading on 9-25, BP 170/90, old BP cuff read 131/72  Pt denies Cardiac sxs, Headache, vision changes at triage.  Pt would like to come in to monitor new BP cuff. Appt scheduled.   Symptoms  Reason For Call & Symptoms: Elevated BP  Reviewed Health History In EMR: Yes  Reviewed Medications In EMR: Yes  Reviewed Allergies In EMR: Yes  Reviewed Surgeries / Procedures: Yes  Date of Onset of Symptoms: 03/03/2013  Guideline(s) Used:  High Blood Pressure  Disposition Per Guideline:   See Today in Office  Reason For Disposition Reached:   Patient wants to be seen  Advice Given:  N/A  Patient Will Follow Care Advice:  YES  Appointment Scheduled:  03/03/2013 16:00:00 Appointment Scheduled Provider:  Eleonore Chiquito (Family Practice > 57yrs old)

## 2013-03-03 NOTE — Patient Instructions (Signed)
Limit your sodium (Salt) intake    It is important that you exercise regularly, at least 20 minutes 3 to 4 times per week.  If you develop chest pain or shortness of breath seek  medical attention.  Please check your blood pressure on a regular basis.  If it is consistently greater than 150/90, please make an office appointment.  Return in 6 months for follow-up   

## 2013-03-03 NOTE — Telephone Encounter (Signed)
Noted  

## 2013-03-03 NOTE — Progress Notes (Signed)
  Subjective:    Patient ID: Richard Ho, male    DOB: 1941-04-17, 72 y.o.   MRN: [id number]  HPI   72 year old patient who is seen today for follow up of hypertension. He has obtained a new home blood pressure monitor and is somewhat concerned about variable readings.  Multiple readings were taken here in the office with a very tight correlation with his home blood pressure monitor     Review of Systems     Objective:   Physical Exam        Assessment & Plan:  Hypertension well controlled on present regimen No change in therapy Return as scheduled for followup in 3-6 months Continue home blood pressure monitoring

## 2013-04-23 ENCOUNTER — Other Ambulatory Visit: Payer: Self-pay | Admitting: Internal Medicine

## 2013-04-28 ENCOUNTER — Other Ambulatory Visit: Payer: Self-pay | Admitting: Internal Medicine

## 2013-05-02 DIAGNOSIS — H25819 Combined forms of age-related cataract, unspecified eye: Secondary | ICD-10-CM | POA: Insufficient documentation

## 2013-05-02 DIAGNOSIS — H43811 Vitreous degeneration, right eye: Secondary | ICD-10-CM | POA: Insufficient documentation

## 2013-05-02 DIAGNOSIS — H02839 Dermatochalasis of unspecified eye, unspecified eyelid: Secondary | ICD-10-CM | POA: Insufficient documentation

## 2013-05-02 DIAGNOSIS — H18413 Arcus senilis, bilateral: Secondary | ICD-10-CM | POA: Insufficient documentation

## 2013-05-02 DIAGNOSIS — H2640 Unspecified secondary cataract: Secondary | ICD-10-CM | POA: Insufficient documentation

## 2013-05-23 ENCOUNTER — Ambulatory Visit: Payer: Medicare Other | Admitting: Internal Medicine

## 2013-05-30 ENCOUNTER — Ambulatory Visit (INDEPENDENT_AMBULATORY_CARE_PROVIDER_SITE_OTHER): Payer: Medicare Other | Admitting: Internal Medicine

## 2013-05-30 ENCOUNTER — Encounter: Payer: Self-pay | Admitting: Internal Medicine

## 2013-05-30 VITALS — BP 140/80 | HR 80 | Temp 97.7°F | Wt 227.0 lb

## 2013-05-30 DIAGNOSIS — Z23 Encounter for immunization: Secondary | ICD-10-CM

## 2013-05-30 DIAGNOSIS — E119 Type 2 diabetes mellitus without complications: Secondary | ICD-10-CM

## 2013-05-30 DIAGNOSIS — I10 Essential (primary) hypertension: Secondary | ICD-10-CM

## 2013-05-30 DIAGNOSIS — E785 Hyperlipidemia, unspecified: Secondary | ICD-10-CM

## 2013-05-30 MED ORDER — FLUTICASONE PROPIONATE 50 MCG/ACT NA SUSP: 2.0000 | Freq: Every day | NASAL | Status: DC

## 2013-05-30 NOTE — Patient Instructions (Signed)
Limit your sodium (Salt) intake    It is important that you exercise regularly, at least 20 minutes 3 to 4 times per week.  If you develop chest pain or shortness of breath seek  medical attention.  You need to lose weight.  Consider a lower calorie diet and regular exercise. 

## 2013-05-30 NOTE — Progress Notes (Signed)
Subjective:    Patient ID: Richard Ho, male    DOB: 1941/02/04, 72 y.o.   MRN: [id number]  HPI Pre-visit discussion using our clinic review tool. No additional management support is needed unless otherwise documented below in the visit note.  72 year old patient who is seen today for his correlate followup. He has type 2 diabetes which has been well-controlled on metformin therapy only. He does have some occasional hyperglycemia with dietary indiscretion but in general does quite well. Hemoglobin A1c as have been well-controlled. He has coronary artery disease which has been stable. Remains on simvastatin which he continues to tolerate. Denies any chest pain He has a history of asthma and his chief complaint today is postnasal drip.  He is scheduled for cataract extraction surgery next month  Past Medical History  Diagnosis Date  . Asthma   . Coronary artery disease 1991    a) Angioplasty LAD 1991 [place] b) MI in 1995 at  [facility] in [place], med Rx  . GERD (gastroesophageal reflux disease)   . Hyperlipidemia   . Myocardial infarct   . Nephrolithiasis   . Osteopenia   . HTN (hypertension)   . Diabetes mellitus   . Hearing loss     History   Social History  . Marital Status: Married    Spouse Name: N/A    Number of Children: N/A  . Years of Education: N/A   Occupational History  . Retired    Social History Main Topics  . Smoking status: Never Smoker   . Smokeless tobacco: Never Used  . Alcohol Use: No  . Drug Use: No  . Sexual Activity: Not on file   Other Topics Concern  . Not on file   Social History Narrative  . No narrative on file    Past Surgical History  Procedure Laterality Date  . Inguinal hernia repair    . Ptca    . Vasectomy    . Orif tibia & fibula fractures    . Angioplasty  1991  . Eye surgery  2010    cataract - right  . Cholecystectomy  2012  . Cardiac catheterization  2005    Moderate LCx and LAD disease and severe  diffuse RCA disease  . Cardiac catheterization  01/2012    normal left main, 50% pLAD stenosis, dLAD mild luminal irregularities, D1 30-40% stenosis at take off; subtotally occluded/severely diseased OM1, mid 80% stenosis in small OM2, long prox and mid RCA stenoses ranging from 60-95%, severely diseased PDA, distal PLSA occluded filling via L-R collateral; normal LV function; inferior lateral basal akinesis.    Family History  Problem Relation Age of Onset  . Asthma Mother   . Allergies Sister   . Coronary artery disease Other     Allergies  Allergen Reactions  . Meloxicam Other (See Comments)    Severe gastritis    Current Outpatient Prescriptions on File Prior to Visit  Medication Sig Dispense Refill  . albuterol (PROVENTIL HFA;VENTOLIN HFA) 108 (90 BASE) MCG/ACT inhaler Inhale 2 puffs into the lungs every 4 (four) hours as needed. For shortness of breath      . atenolol (TENORMIN) 50 MG tablet Take 0.5 tablets (25 mg total) by mouth 2 (two) times daily.  90 tablet  4  . Cholecalciferol (VITAMIN D) 2000 UNITS CAPS Take 2,000 Units by mouth daily.       Marland Kitchen gabapentin (NEURONTIN) 300 MG capsule Take 1 capsule (300 mg total) by mouth 3 (  three) times daily. Per [facility] takes 300mg  qam and 900mg  qpm  90 capsule  6  . Hyoscyamine Sulfate (HYOMAX-DT) 0.375 MG TBCR Take 1-2 tablets by mouth daily as needed. For irritable bowel syndrome      . metFORMIN (GLUCOPHAGE) 1000 MG tablet Take 1 tablet (1,000 mg total) by mouth 2 (two) times daily with a meal.  180 tablet  3  . Multiple Vitamin (MULTIVITAMIN) tablet Take 1 tablet by mouth daily.        . nitroGLYCERIN (NITROSTAT) 0.4 MG SL tablet Place 1 tablet (0.4 mg total) under the tongue every 5 (five) minutes as needed. Chest pain  25 tablet  3  . nortriptyline (PAMELOR) 25 MG capsule TAKE ONE CAPSULE BY MOUTH AT BEDTIME  90 capsule  1  . pantoprazole (PROTONIX) 40 MG tablet Take 40 mg by mouth daily. Take one half hour before eating.       Marland Kitchen  PRECISION XTRA TEST STRIPS test strip USE ONE STRIP ONCE DAILY  100 each  3  . ramipril (ALTACE) 5 MG capsule TAKE ONE CAPSULE BY MOUTH ONCE DAILY  90 capsule  3  . simvastatin (ZOCOR) 80 MG tablet Take 40 mg by mouth at bedtime.       . isosorbide mononitrate (IMDUR) 30 MG 24 hr tablet Take 1 tablet (30 mg total) by mouth daily.  90 tablet  3  . levothyroxine (LEVOTHROID) 25 MCG tablet Take 1 tablet (25 mcg total) by mouth daily.  90 tablet  6   No current facility-administered medications on file prior to visit.    BP 140/80  Pulse 80  Temp(Src) 97.7 F (36.5 C) (Oral)  Wt 227 lb (102.967 kg)       Review of Systems  Constitutional: Negative for fever, chills, appetite change and fatigue.  HENT: Positive for postnasal drip and rhinorrhea. Negative for congestion, dental problem, ear pain, hearing loss, sore throat, tinnitus, trouble swallowing and voice change.   Eyes: Negative for pain, discharge and visual disturbance.  Respiratory: Negative for cough, chest tightness, wheezing and stridor.   Cardiovascular: Negative for chest pain, palpitations and leg swelling.  Gastrointestinal: Negative for nausea, vomiting, abdominal pain, diarrhea, constipation, blood in stool and abdominal distention.  Genitourinary: Negative for urgency, hematuria, flank pain, discharge, difficulty urinating and genital sores.  Musculoskeletal: Negative for arthralgias, back pain, gait problem, joint swelling, myalgias and neck stiffness.  Skin: Negative for rash.  Neurological: Negative for dizziness, syncope, speech difficulty, weakness, numbness and headaches.  Hematological: Negative for adenopathy. Does not bruise/bleed easily.  Psychiatric/Behavioral: Negative for behavioral problems and dysphoric mood. The patient is not nervous/anxious.        Objective:   Physical Exam  Constitutional: He is oriented to person, place, and time. He appears well-developed.  HENT:  Head: Normocephalic.    Right Ear: External ear normal.  Left Ear: External ear normal.  Eyes: Conjunctivae and EOM are normal.  Neck: Normal range of motion.  Cardiovascular: Normal rate and normal heart sounds.   Pulmonary/Chest: Breath sounds normal.  Abdominal: Bowel sounds are normal.  Musculoskeletal: Normal range of motion. He exhibits no edema and no tenderness.  Neurological: He is alert and oriented to person, place, and time.  Psychiatric: He has a normal mood and affect. His behavior is normal.          Assessment & Plan:   Diabetes mellitus. Appears to be well controlled. We'll continue metformin therapy. We'll check a hemoglobin A1c Hypertension stable. The  blood pressure 124/80 Coronary artery disease stable  CPX 3 months

## 2013-05-30 NOTE — Progress Notes (Signed)
Pre visit review using our clinic review tool, if applicable. No additional management support is needed unless otherwise documented below in the visit note. 

## 2013-06-24 DIAGNOSIS — Z961 Presence of intraocular lens: Secondary | ICD-10-CM | POA: Insufficient documentation

## 2013-07-07 DIAGNOSIS — Z961 Presence of intraocular lens: Secondary | ICD-10-CM | POA: Insufficient documentation

## 2013-07-11 ENCOUNTER — Encounter: Payer: Self-pay | Admitting: Family Medicine

## 2013-07-11 ENCOUNTER — Ambulatory Visit (INDEPENDENT_AMBULATORY_CARE_PROVIDER_SITE_OTHER): Payer: Medicare Other | Admitting: Family Medicine

## 2013-07-11 VITALS — BP 142/70 | HR 92 | Temp 98.4°F | Wt 220.0 lb

## 2013-07-11 DIAGNOSIS — J45909 Unspecified asthma, uncomplicated: Secondary | ICD-10-CM

## 2013-07-11 MED ORDER — PREDNISONE 10 MG PO TABS: ORAL_TABLET | ORAL | Status: DC

## 2013-07-11 MED ORDER — HYDROCOD POLST-CHLORPHEN POLST 10-8 MG/5ML PO LQCR: 5.0000 mL | Freq: Two times a day (BID) | ORAL | Status: DC | PRN

## 2013-07-11 NOTE — Progress Notes (Signed)
Pre visit review using our clinic review tool, if applicable. No additional management support is needed unless otherwise documented below in the visit note. 

## 2013-07-11 NOTE — Progress Notes (Signed)
   Subjective:    Patient ID: Richard Ho, male    DOB: March 28, 1941, 73 y.o.   MRN: [id number]  HPI Last Wednesday cataract surgery. Thursday noted some chest symptoms of cough and wheezing. LGF initially, but none now.  No nasal symptoms.  No aches. Nonsmoker.  Cough is nonproductive. He uses albuterol as needed. Has type 2 diabetes which is been well controlled. Denies any sore throat. Cough is been severe at night. He is requesting cough medication. Previously has taken Tussionex.  Past Medical History  Diagnosis Date  . Asthma   . Coronary artery disease 1991    a) Angioplasty LAD 1991 [place] b) MI in 1995 at  [facility] in [place], med Rx  . GERD (gastroesophageal reflux disease)   . Hyperlipidemia   . Myocardial infarct   . Nephrolithiasis   . Osteopenia   . HTN (hypertension)   . Diabetes mellitus   . Hearing loss    Past Surgical History  Procedure Laterality Date  . Inguinal hernia repair    . Ptca    . Vasectomy    . Orif tibia & fibula fractures    . Angioplasty  1991  . Eye surgery  2010    cataract - right  . Cholecystectomy  2012  . Cardiac catheterization  2005    Moderate LCx and LAD disease and severe diffuse RCA disease  . Cardiac catheterization  01/2012    normal left main, 50% pLAD stenosis, dLAD mild luminal irregularities, D1 30-40% stenosis at take off; subtotally occluded/severely diseased OM1, mid 80% stenosis in small OM2, long prox and mid RCA stenoses ranging from 60-95%, severely diseased PDA, distal PLSA occluded filling via L-R collateral; normal LV function; inferior lateral basal akinesis.    reports that he has never smoked. He has never used smokeless tobacco. He reports that he does not drink alcohol or use illicit drugs. family history includes Allergies in his sister; Asthma in his mother; Coronary artery disease in his other. Allergies  Allergen Reactions  . Meloxicam Other (See Comments)    Severe gastritis       Review of Systems  Constitutional: Negative for fever and chills.  HENT: Negative for congestion.   Respiratory: Positive for cough, shortness of breath and wheezing.   Cardiovascular: Negative for chest pain.       Objective:   Physical Exam  Constitutional: He appears well-developed and well-nourished.  HENT:  Right Ear: External ear normal.  Left Ear: External ear normal.  Mouth/Throat: Oropharynx is clear and moist.  Cardiovascular: Normal rate.   Pulmonary/Chest: Effort normal. No respiratory distress. He has wheezes. He has no rales.  Musculoskeletal: He exhibits no edema.          Assessment & Plan:  Asthmatic bronchitis. Pulse oximetry 96% and no respiratory distress. Prednisone taper starting at 40 mg daily. Wrote for limited Tussionex 1/2-1 teaspoon each bedtime for severe cough. Monitor blood sugars closely

## 2013-07-11 NOTE — Patient Instructions (Signed)
Acute Bronchitis Bronchitis is inflammation of the airways that extend from the windpipe into the lungs (bronchi). The inflammation often causes mucus to develop. This leads to a cough, which is the most common symptom of bronchitis.  In acute bronchitis, the condition usually develops suddenly and goes away over time, usually in a couple weeks. Smoking, allergies, and asthma can make bronchitis worse. Repeated episodes of bronchitis may cause further lung problems.  CAUSES Acute bronchitis is most often caused by the same virus that causes a cold. The virus can spread from person to person (contagious).  SIGNS AND SYMPTOMS   Cough.   Fever.   Coughing up mucus.   Body aches.   Chest congestion.   Chills.   Shortness of breath.   Sore throat.  DIAGNOSIS  Acute bronchitis is usually diagnosed through a physical exam. Tests, such as chest X-rays, are sometimes done to rule out other conditions.  TREATMENT  Acute bronchitis usually goes away in a couple weeks. Often times, no medical treatment is necessary. Medicines are sometimes given for relief of fever or cough. Antibiotics are usually not needed but may be prescribed in certain situations. In some cases, an inhaler may be recommended to help reduce shortness of breath and control the cough. A cool mist vaporizer may also be used to help thin bronchial secretions and make it easier to clear the chest.  HOME CARE INSTRUCTIONS  Get plenty of rest.   Drink enough fluids to keep your urine clear or pale yellow (unless you have a medical condition that requires fluid restriction). Increasing fluids may help thin your secretions and will prevent dehydration.   Only take over-the-counter or prescription medicines as directed by your health care provider.   Avoid smoking and secondhand smoke. Exposure to cigarette smoke or irritating chemicals will make bronchitis worse. If you are a smoker, consider using nicotine gum or skin  patches to help control withdrawal symptoms. Quitting smoking will help your lungs heal faster.   Reduce the chances of another bout of acute bronchitis by washing your hands frequently, avoiding people with cold symptoms, and trying not to touch your hands to your mouth, nose, or eyes.   Follow up with your health care provider as directed.  SEEK MEDICAL CARE IF: Your symptoms do not improve after 1 week of treatment.  SEEK IMMEDIATE MEDICAL CARE IF:  You develop an increased fever or chills.   You have chest pain.   You have severe shortness of breath.  You have bloody sputum.   You develop dehydration.  You develop fainting.  You develop repeated vomiting.  You develop a severe headache. MAKE SURE YOU:   Understand these instructions.  Will watch your condition.  Will get help right away if you are not doing well or get worse. Document Released: 07/03/2004 Document Revised: 01/26/2013 Document Reviewed: 11/16/2012 Exit[employer] Patient Information 2014 [employer].  

## 2013-07-16 ENCOUNTER — Ambulatory Visit (INDEPENDENT_AMBULATORY_CARE_PROVIDER_SITE_OTHER): Payer: Medicare Other | Admitting: Adult Health

## 2013-07-16 ENCOUNTER — Encounter: Payer: Self-pay | Admitting: Adult Health

## 2013-07-16 VITALS — BP 150/90 | HR 102 | Temp 100.3°F | Resp 14 | Wt 224.8 lb

## 2013-07-16 DIAGNOSIS — R062 Wheezing: Secondary | ICD-10-CM

## 2013-07-16 DIAGNOSIS — J020 Streptococcal pharyngitis: Secondary | ICD-10-CM

## 2013-07-16 DIAGNOSIS — R6889 Other general symptoms and signs: Secondary | ICD-10-CM

## 2013-07-16 LAB — POCT INFLUENZA A/B
INFLUENZA B, POC: NEGATIVE
Influenza A, POC: NEGATIVE

## 2013-07-16 LAB — POCT RAPID STREP A (OFFICE): RAPID STREP A SCREEN: NEGATIVE

## 2013-07-16 MED ORDER — AZITHROMYCIN 250 MG PO TABS: ORAL_TABLET | ORAL | Status: DC

## 2013-07-16 MED ORDER — ALBUTEROL SULFATE (2.5 MG/3ML) 0.083% IN NEBU
2.5000 mg | INHALATION_SOLUTION | Freq: Once | RESPIRATORY_TRACT | Status: AC
  Administered 2013-07-16: 2.5 mg via RESPIRATORY_TRACT

## 2013-07-16 NOTE — Progress Notes (Signed)
Patient ID: Richard Ho, male   DOB: Oct 27, 1940, 73 y.o.   MRN: [id number]    Subjective:    Patient ID: Richard Ho, male    DOB: Oct 27, 1940, 73 y.o.   MRN: [id number]  Cough Associated symptoms include a fever, a sore throat and wheezing. Pertinent negatives include no chills.  Sore Throat  Associated symptoms include coughing.    Pt is a 73 y/o male who presents to clinic with chest congestion, cough and sore throat. He is s/p cataract sgy ~ 2 weeks ago. Symptoms started ~ 1 week ago on Thursday. He was given a pred taper on 07/11/13 and felt better until this past Thursday when symptoms reappeared.   Past Medical History  Diagnosis Date  . Asthma   . Coronary artery disease 1991    a) Angioplasty LAD 1991 [place] b) MI in 1995 at  [facility] in [place], med Rx  . GERD (gastroesophageal reflux disease)   . Hyperlipidemia   . Myocardial infarct   . Nephrolithiasis   . Osteopenia   . HTN (hypertension)   . Diabetes mellitus   . Hearing loss     Current Outpatient Prescriptions on File Prior to Visit  Medication Sig Dispense Refill  . albuterol (PROVENTIL HFA;VENTOLIN HFA) 108 (90 BASE) MCG/ACT inhaler Inhale 2 puffs into the lungs every 4 (four) hours as needed. For shortness of breath      . atenolol (TENORMIN) 50 MG tablet Take 0.5 tablets (25 mg total) by mouth 2 (two) times daily.  90 tablet  4  . chlorpheniramine-HYDROcodone (TUSSIONEX PENNKINETIC ER) 10-8 MG/5ML LQCR Take 5 mLs by mouth every 12 (twelve) hours as needed for cough.  120 mL  0  . Cholecalciferol (VITAMIN D) 2000 UNITS CAPS Take 2,000 Units by mouth daily.       . ferrous sulfate 325 (65 FE) MG tablet Take 325 mg by mouth daily with breakfast.      . fluticasone (FLONASE) 50 MCG/ACT nasal spray Place 2 sprays into both nostrils daily.  16 g  6  . gabapentin (NEURONTIN) 300 MG capsule Take 1 capsule (300 mg total) by mouth 3 (three) times daily. Per [facility] takes 300mg  qam and 900mg  qpm  90 capsule  6   . Hyoscyamine Sulfate (HYOMAX-DT) 0.375 MG TBCR Take 1-2 tablets by mouth daily as needed. For irritable bowel syndrome      . metFORMIN (GLUCOPHAGE) 1000 MG tablet Take 1 tablet (1,000 mg total) by mouth 2 (two) times daily with a meal.  180 tablet  3  . Multiple Vitamin (MULTIVITAMIN) tablet Take 1 tablet by mouth daily.        . nitroGLYCERIN (NITROSTAT) 0.4 MG SL tablet Place 1 tablet (0.4 mg total) under the tongue every 5 (five) minutes as needed. Chest pain  25 tablet  3  . nortriptyline (PAMELOR) 25 MG capsule TAKE ONE CAPSULE BY MOUTH AT BEDTIME  90 capsule  1  . pantoprazole (PROTONIX) 40 MG tablet Take 40 mg by mouth daily. Take one half hour before eating.       Marland Kitchen. PRECISION XTRA TEST STRIPS test strip USE ONE STRIP ONCE DAILY  100 each  3  . predniSONE (DELTASONE) 10 MG tablet Taper as follows: 4-4-4-3-3-2-2-1-1  24 tablet  0  . ramipril (ALTACE) 5 MG capsule TAKE ONE CAPSULE BY MOUTH ONCE DAILY  90 capsule  3  . simvastatin (ZOCOR) 80 MG tablet Take 40 mg by mouth at bedtime.       .Marland Kitchen  isosorbide mononitrate (IMDUR) 30 MG 24 hr tablet Take 1 tablet (30 mg total) by mouth daily.  90 tablet  3  . levothyroxine (LEVOTHROID) 25 MCG tablet Take 1 tablet (25 mcg total) by mouth daily.  90 tablet  6   No current facility-administered medications on file prior to visit.    Review of Systems  Constitutional: Positive for fever. Negative for chills.  HENT: Positive for sore throat.   Respiratory: Positive for cough and wheezing.        Chest congestion  Cardiovascular: Negative.   All other systems reviewed and are negative.       Objective:  BP 150/90  Pulse 102  Temp(Src) 100.3 F (37.9 C) (Oral)  Resp 14  Wt 224 lb 12 oz (101.946 kg)  SpO2 94%   Physical Exam  Constitutional: He is oriented to person, place, and time.  Appears acutely ill  HENT:  Pharyngeal erythema. No exudate.  Neck: Normal range of motion. No tracheal deviation present.  Cardiovascular: Normal rate,  regular rhythm and normal heart sounds.   Pulmonary/Chest: Effort normal. No respiratory distress. He has wheezes. He has no rales. He exhibits no tenderness.  Musculoskeletal: Normal range of motion.  Lymphadenopathy:    He has no cervical adenopathy.  Neurological: He is alert and oriented to person, place, and time. Coordination normal.  Skin: Skin is warm and dry.  Psychiatric: He has a normal mood and affect. His behavior is normal. Judgment and thought content normal.       Assessment & Plan:   1. Streptococcal sore throat Negative strep.  - POCT rapid strep A  2. Flu-like symptoms Negative for influenza. Breath sounds with decreased air movement throughout. Significant exp wheezing. Albuterol Neb given in clinic. Still on Prednisone taper. Start Azithromycin.  - POCT Influenza A/B

## 2013-07-16 NOTE — Addendum Note (Signed)
Addended by: Warden FillersWRIGHT, LATOYA S on: 07/16/2013 11:38 AM   Modules accepted: Orders

## 2013-07-16 NOTE — Patient Instructions (Addendum)
  Please use your albuterol inhaler as needed every 6 hours for wheezing.  Start the antibiotic Azithromycin as directed.  Continue with your prednisone taper as prescribed earlier by your doctor

## 2013-08-07 ENCOUNTER — Other Ambulatory Visit: Payer: Self-pay | Admitting: Internal Medicine

## 2013-08-18 DIAGNOSIS — Z961 Presence of intraocular lens: Secondary | ICD-10-CM | POA: Insufficient documentation

## 2013-08-23 ENCOUNTER — Encounter: Payer: Self-pay | Admitting: Internal Medicine

## 2013-08-23 ENCOUNTER — Ambulatory Visit (INDEPENDENT_AMBULATORY_CARE_PROVIDER_SITE_OTHER): Payer: Medicare Other | Admitting: Internal Medicine

## 2013-08-23 VITALS — BP 120/76 | HR 77 | Temp 98.0°F | Resp 20 | Ht 71.25 in | Wt 220.0 lb

## 2013-08-23 DIAGNOSIS — E785 Hyperlipidemia, unspecified: Secondary | ICD-10-CM

## 2013-08-23 DIAGNOSIS — I2510 Atherosclerotic heart disease of native coronary artery without angina pectoris: Secondary | ICD-10-CM

## 2013-08-23 DIAGNOSIS — I252 Old myocardial infarction: Secondary | ICD-10-CM

## 2013-08-23 DIAGNOSIS — I10 Essential (primary) hypertension: Secondary | ICD-10-CM

## 2013-08-23 DIAGNOSIS — Z0000 Encounter for general adult medical examination without abnormal findings: Secondary | ICD-10-CM

## 2013-08-23 DIAGNOSIS — E119 Type 2 diabetes mellitus without complications: Secondary | ICD-10-CM

## 2013-08-23 DIAGNOSIS — E039 Hypothyroidism, unspecified: Secondary | ICD-10-CM

## 2013-08-23 LAB — HEMOGLOBIN A1C: Hgb A1c MFr Bld: 7 % — ABNORMAL HIGH (ref 4.6–6.5)

## 2013-08-23 NOTE — Progress Notes (Signed)
Pre-visit discussion using our clinic review tool. No additional management support is needed unless otherwise documented below in the visit note.  

## 2013-08-23 NOTE — Patient Instructions (Signed)
Limit your sodium (Salt) intake   Please check your hemoglobin A1c every 3 months    It is important that you exercise regularly, at least 20 minutes 3 to 4 times per week.  If you develop chest pain or shortness of breath seek  medical attention.  You need to lose weight.  Consider a lower calorie diet and regular exercise. 

## 2013-08-23 NOTE — Progress Notes (Signed)
Subjective:    Patient ID: Richard Ho, male    DOB: 1940-08-15, 73 y.o.   MRN: [id number]  HPI  73 year old patient who is seen today for a glomus exam.  Medical problems include coronary artery disease, which has been stable.  He has type 2 diabetes complicated by diabetic peripheral neuropathy.  He is on both nortriptyline as well as Neurontin.  He has hypertension and remains on statin therapy.  No cardiopulmonary complaints today. He has had a difficult winter and has slowly recovered from a bronchitis. He is followed at [facility] and underwent esophagogastroduodenoscopy and colonoscopy in 2012.  He's had cataract extraction, surgery on the left in January of this year.  He Past Medical History  Diagnosis Date  . Asthma   . Coronary artery disease 1991    a) Angioplasty LAD 1991 [place] b) MI in 1995 at  [facility] in [place], med Rx  . GERD (gastroesophageal reflux disease)   . Hyperlipidemia   . Myocardial infarct   . Nephrolithiasis   . Osteopenia   . HTN (hypertension)   . Diabetes mellitus   . Hearing loss     History   Social History  . Marital Status: Married    Spouse Name: N/A    Number of Children: N/A  . Years of Education: N/A   Occupational History  . Retired    Social History Main Topics  . Smoking status: Never Smoker   . Smokeless tobacco: Never Used  . Alcohol Use: No  . Drug Use: No  . Sexual Activity: Not on file   Other Topics Concern  . Not on file   Social History Narrative  . No narrative on file    Past Surgical History  Procedure Laterality Date  . Inguinal hernia repair    . Ptca    . Vasectomy    . Orif tibia & fibula fractures    . Angioplasty  1991  . Eye surgery  2010    cataract - right  . Cholecystectomy  2012  . Cardiac catheterization  2005    Moderate LCx and LAD disease and severe diffuse RCA disease  . Cardiac catheterization  01/2012    normal left main, 50% pLAD stenosis, dLAD mild luminal  irregularities, D1 30-40% stenosis at take off; subtotally occluded/severely diseased OM1, mid 80% stenosis in small OM2, long prox and mid RCA stenoses ranging from 60-95%, severely diseased PDA, distal PLSA occluded filling via L-R collateral; normal LV function; inferior lateral basal akinesis.    Family History  Problem Relation Age of Onset  . Asthma Mother   . Allergies Sister   . Coronary artery disease Other     Allergies  Allergen Reactions  . Meloxicam Other (See Comments)    Severe gastritis    Current Outpatient Prescriptions on File Prior to Visit  Medication Sig Dispense Refill  . albuterol (PROVENTIL HFA;VENTOLIN HFA) 108 (90 BASE) MCG/ACT inhaler Inhale 2 puffs into the lungs every 4 (four) hours as needed. For shortness of breath      . Cholecalciferol (VITAMIN D) 2000 UNITS CAPS Take 2,000 Units by mouth daily.       . ferrous sulfate 325 (65 FE) MG tablet Take 325 mg by mouth daily with breakfast.      . fluticasone (FLONASE) 50 MCG/ACT nasal spray Place 2 sprays into both nostrils daily.  16 g  6  . gabapentin (NEURONTIN) 300 MG capsule Take 1 capsule (300  mg total) by mouth 3 (three) times daily. Per [facility] takes 300mg  qam and 900mg  qpm  90 capsule  6  . Hyoscyamine Sulfate (HYOMAX-DT) 0.375 MG TBCR Take 1-2 tablets by mouth daily as needed. For irritable bowel syndrome      . metFORMIN (GLUCOPHAGE) 1000 MG tablet Take 1 tablet (1,000 mg total) by mouth 2 (two) times daily with a meal.  180 tablet  3  . Multiple Vitamin (MULTIVITAMIN) tablet Take 1 tablet by mouth daily.        . nitroGLYCERIN (NITROSTAT) 0.4 MG SL tablet Place 1 tablet (0.4 mg total) under the tongue every 5 (five) minutes as needed. Chest pain  25 tablet  3  . nortriptyline (PAMELOR) 25 MG capsule TAKE ONE CAPSULE BY MOUTH AT BEDTIME  90 capsule  1  . pantoprazole (PROTONIX) 40 MG tablet Take 40 mg by mouth daily. Take one half hour before eating.       Marland Kitchen. PRECISION XTRA TEST STRIPS test strip USE ONE  STRIP ONCE DAILY  100 each  3  . ramipril (ALTACE) 5 MG capsule TAKE ONE CAPSULE BY MOUTH ONCE DAILY  90 capsule  3  . simvastatin (ZOCOR) 80 MG tablet Take 40 mg by mouth at bedtime.       . isosorbide mononitrate (IMDUR) 30 MG 24 hr tablet Take 1 tablet (30 mg total) by mouth daily.  90 tablet  3  . levothyroxine (LEVOTHROID) 25 MCG tablet Take 1 tablet (25 mcg total) by mouth daily.  90 tablet  6   No current facility-administered medications on file prior to visit.    BP 120/76  Pulse 77  Temp(Src) 98 F (36.7 C) (Oral)  Resp 20  Ht 5' 11.25" (1.81 m)  Wt 220 lb (99.791 kg)  BMI 30.46 kg/m2  SpO2 96%    1. Risk factors, based on past  M,S,F history-patient has known cardiac disease.  Status post PCI in the past.  Risk factors include hypertension, diabetes, and dyslipidemia  2.  Physical activities: No restrictions.  Has been in cardiac rehabilitation recently  3.  Depression/mood: No history of depression or mood disorder  4.  Hearing: Moderate deficits with tinnitus.  [facility] recent [facility]  5.  ADL's: Totally independent  6.  Fall risk: Low  7.  Home safety: No problems identified  8.  Height weight, and visual acuity; height and weight stable, status post recent left cataract extraction, surgery.  He states his vision is 20/20 on the left and corrected to 20/20 on the right with glasses  9.  Counseling: Weight loss modest exercise regimen.  Encouraged  10. Lab orders based on risk factors: Hemoglobin A1c will be reviewed.    11. Referral : Follow cardiology  12. Care plan: Continue heart healthy diet and has had some modest weight loss.  Poorly hemoglobin A1c is  13. Cognitive assessment: Alert and appropriate.  Normal affect.  No cognitive dysfunction        Review of Systems  Constitutional: Negative for fever, chills, activity change, appetite change and fatigue.  HENT: Negative for congestion, dental problem, ear  pain, hearing loss, mouth sores, rhinorrhea, sinus pressure, sneezing, tinnitus, trouble swallowing and voice change.   Eyes: Negative for photophobia, pain, redness and visual disturbance.  Respiratory: Negative for apnea, cough, choking, chest tightness, shortness of breath and wheezing.   Cardiovascular: Negative for chest pain, palpitations and leg swelling.  Gastrointestinal: Negative for nausea,  vomiting, abdominal pain, diarrhea, constipation, blood in stool, abdominal distention, anal bleeding and rectal pain.  Genitourinary: Negative for dysuria, urgency, frequency, hematuria, flank pain, decreased urine volume, discharge, penile swelling, scrotal swelling, difficulty urinating, genital sores and testicular pain.  Musculoskeletal: Negative for arthralgias, back pain, gait problem, joint swelling, myalgias, neck pain and neck stiffness.  Skin: Negative for color change, rash and wound.  Neurological: Positive for numbness. Negative for dizziness, tremors, seizures, syncope, facial asymmetry, speech difficulty, weakness, light-headedness and headaches.  Hematological: Negative for adenopathy. Does not bruise/bleed easily.  Psychiatric/Behavioral: Negative for suicidal ideas, hallucinations, behavioral problems, confusion, sleep disturbance, self-injury, dysphoric mood, decreased concentration and agitation. The patient is not nervous/anxious.        Objective:   Physical Exam  Constitutional: He appears well-developed and well-nourished.  HENT:  Head: Normocephalic and atraumatic.  Right Ear: External ear normal.  Left Ear: External ear normal.  Nose: Nose normal.  Mouth/Throat: Oropharynx is clear and moist.  Hearing aids in place bilaterally  Eyes: Conjunctivae and EOM are normal. Pupils are equal, round, and reactive to light. No scleral icterus.  Neck: Normal range of motion. Neck supple. No JVD present. No thyromegaly present.  Cardiovascular: Regular rhythm, normal heart sounds  and intact distal pulses.  Exam reveals no gallop and no friction rub.   No murmur heard. Pulmonary/Chest: Effort normal and breath sounds normal. He exhibits no tenderness.  Abdominal: Soft. Bowel sounds are normal. He exhibits no distension and no mass. There is no tenderness.  Genitourinary: Prostate normal and penis normal.  Musculoskeletal: Normal range of motion. He exhibits no edema and no tenderness.  Lymphadenopathy:    He has no cervical adenopathy.  Neurological: He is alert. He has normal reflexes. No cranial nerve deficit. Coordination normal.  Skin: Skin is warm and dry. No rash noted.  Psychiatric: He has a normal mood and affect. His behavior is normal.          Assessment & Plan:  Preventive health exam Diabetes mellitus.  Appears to be, well-controlled.  We'll check a hemoglobin A1c Coronary artery disease, stable Hypertension stable Dyslipidemia.  Continue statin therapy  3 check 3 months

## 2013-08-24 ENCOUNTER — Telehealth: Payer: Self-pay | Admitting: Internal Medicine

## 2013-08-24 NOTE — Telephone Encounter (Signed)
Relevant patient education assigned to patient using Emmi. ° °

## 2013-09-01 ENCOUNTER — Telehealth: Payer: Self-pay

## 2013-09-01 NOTE — Telephone Encounter (Signed)
Relevant patient education assigned to patient using Emmi. ° °

## 2013-10-04 ENCOUNTER — Encounter: Payer: Self-pay | Admitting: Internal Medicine

## 2013-10-04 ENCOUNTER — Ambulatory Visit (INDEPENDENT_AMBULATORY_CARE_PROVIDER_SITE_OTHER): Payer: Medicare Other | Admitting: Internal Medicine

## 2013-10-04 VITALS — BP 120/74 | HR 90 | Temp 97.7°F | Resp 20 | Ht 71.25 in | Wt 222.0 lb

## 2013-10-04 DIAGNOSIS — E119 Type 2 diabetes mellitus without complications: Secondary | ICD-10-CM

## 2013-10-04 DIAGNOSIS — E1149 Type 2 diabetes mellitus with other diabetic neurological complication: Secondary | ICD-10-CM

## 2013-10-04 NOTE — Progress Notes (Signed)
Pre-visit discussion using our clinic review tool. No additional management support is needed unless otherwise documented below in the visit note.  

## 2013-10-04 NOTE — Patient Instructions (Signed)
Call or return to clinic prn if these symptoms worsen or fail to improve as anticipated.   Please check your hemoglobin A1c every 3 months   

## 2013-10-04 NOTE — Progress Notes (Signed)
Subjective:    Patient ID: Richard Ho, male    DOB: 03/12/1941, 73 y.o.   MRN: [id number]  HPI  73 year old patient who has type 2 diabetes, controlled on metformin therapy.  He's had some recent diarrhea and was concerned about side effects of metformin therapy.  Diarrhea more recently has resolved.  He has not interrupted his treatment with the metformin therapy. He does have a history of a diabetic peripheral neuropathy and does complain of some slightly unsteady gait  Past Medical History  Diagnosis Date  . Asthma   . Coronary artery disease 1991    a) Angioplasty LAD 1991 [place] b) MI in 1995 at  [facility] in [place], med Rx  . GERD (gastroesophageal reflux disease)   . Hyperlipidemia   . Myocardial infarct   . Nephrolithiasis   . Osteopenia   . HTN (hypertension)   . Diabetes mellitus   . Hearing loss     History   Social History  . Marital Status: Married    Spouse Name: N/A    Number of Children: N/A  . Years of Education: N/A   Occupational History  . Retired    Social History Main Topics  . Smoking status: Never Smoker   . Smokeless tobacco: Never Used  . Alcohol Use: No  . Drug Use: No  . Sexual Activity: Not on file   Other Topics Concern  . Not on file   Social History Narrative  . No narrative on file    Past Surgical History  Procedure Laterality Date  . Inguinal hernia repair    . Ptca    . Vasectomy    . Orif tibia & fibula fractures    . Angioplasty  1991  . Eye surgery  2010    cataract - right  . Cholecystectomy  2012  . Cardiac catheterization  2005    Moderate LCx and LAD disease and severe diffuse RCA disease  . Cardiac catheterization  01/2012    normal left main, 50% pLAD stenosis, dLAD mild luminal irregularities, D1 30-40% stenosis at take off; subtotally occluded/severely diseased OM1, mid 80% stenosis in small OM2, long prox and mid RCA stenoses ranging from 60-95%, severely diseased PDA, distal PLSA occluded  filling via L-R collateral; normal LV function; inferior lateral basal akinesis.    Family History  Problem Relation Age of Onset  . Asthma Mother   . Allergies Sister   . Coronary artery disease Other     Allergies  Allergen Reactions  . Meloxicam Other (See Comments)    Severe gastritis    Current Outpatient Prescriptions on File Prior to Visit  Medication Sig Dispense Refill  . albuterol (PROVENTIL HFA;VENTOLIN HFA) 108 (90 BASE) MCG/ACT inhaler Inhale 2 puffs into the lungs every 4 (four) hours as needed. For shortness of breath      . atenolol (TENORMIN) 50 MG tablet Take 50 mg by mouth 2 (two) times daily.      . Cholecalciferol (VITAMIN D) 2000 UNITS CAPS Take 2,000 Units by mouth daily.       . ferrous sulfate 325 (65 FE) MG tablet Take 325 mg by mouth daily with breakfast.      . fluticasone (FLONASE) 50 MCG/ACT nasal spray Place 2 sprays into both nostrils daily.  16 g  6  . gabapentin (NEURONTIN) 300 MG capsule Take 1 capsule (300 mg total) by mouth 3 (three) times daily. [facility] takes 300mg  qam and 900mg  qpm  90 capsule  6  . Hyoscyamine Sulfate (HYOMAX-DT) 0.375 MG TBCR Take 1-2 tablets by mouth daily as needed. For irritable bowel syndrome      . metFORMIN (GLUCOPHAGE) 1000 MG tablet Take 1 tablet (1,000 mg total) by mouth 2 (two) times daily with a meal.  180 tablet  3  . Multiple Vitamin (MULTIVITAMIN) tablet Take 1 tablet by mouth daily.        . nitroGLYCERIN (NITROSTAT) 0.4 MG SL tablet Place 1 tablet (0.4 mg total) under the tongue every 5 (five) minutes as needed. Chest pain  25 tablet  3  . nortriptyline (PAMELOR) 25 MG capsule TAKE ONE CAPSULE BY MOUTH AT BEDTIME  90 capsule  1  . pantoprazole (PROTONIX) 40 MG tablet Take 40 mg by mouth daily. Take one half hour before eating.       Marland Kitchen PRECISION XTRA TEST STRIPS test strip USE ONE STRIP ONCE DAILY  100 each  3  . ramipril (ALTACE) 5 MG capsule TAKE ONE CAPSULE BY MOUTH ONCE DAILY  90 capsule  3  . simvastatin  (ZOCOR) 80 MG tablet Take 40 mg by mouth at bedtime.       . isosorbide mononitrate (IMDUR) 30 MG 24 hr tablet Take 1 tablet (30 mg total) by mouth daily.  90 tablet  3  . levothyroxine (LEVOTHROID) 25 MCG tablet Take 1 tablet (25 mcg total) by mouth daily.  90 tablet  6   No current facility-administered medications on file prior to visit.    BP 120/74  Pulse 90  Temp(Src) 97.7 F (36.5 C) (Oral)  Resp 20  Ht 5' 11.25" (1.81 m)  Wt 222 lb (100.699 kg)  BMI 30.74 kg/m2  SpO2 97%     Review of Systems  Constitutional: Negative for fever, chills, appetite change and fatigue.  HENT: Negative for congestion, dental problem, ear pain, hearing loss, sore throat, tinnitus, trouble swallowing and voice change.   Eyes: Negative for pain, discharge and visual disturbance.  Respiratory: Negative for cough, chest tightness, wheezing and stridor.   Cardiovascular: Negative for chest pain, palpitations and leg swelling.  Gastrointestinal: Positive for diarrhea. Negative for nausea, vomiting, abdominal pain, constipation, blood in stool and abdominal distention.  Genitourinary: Negative for urgency, hematuria, flank pain, discharge, difficulty urinating and genital sores.  Musculoskeletal: Positive for gait problem. Negative for arthralgias, back pain, joint swelling, myalgias and neck stiffness.  Skin: Negative for rash.  Neurological: Negative for dizziness, syncope, speech difficulty, weakness, numbness and headaches.  Hematological: Negative for adenopathy. Does not bruise/bleed easily.  Psychiatric/Behavioral: Negative for behavioral problems and dysphoric mood. The patient is not nervous/anxious.        Objective:   Physical Exam  Constitutional: He is oriented to person, place, and time. He appears well-developed.  HENT:  Head: Normocephalic.  Right Ear: External ear normal.  Left Ear: External ear normal.  Eyes: Conjunctivae and EOM are normal.  Neck: Normal range of motion.    Cardiovascular: Normal rate and normal heart sounds.   Pulmonary/Chest: Breath sounds normal.  Abdominal: Bowel sounds are normal.  Musculoskeletal: Normal range of motion. He exhibits no edema and no tenderness.  Neurological: He is alert and oriented to person, place, and time.  Gait appears fairly normal Tandem  walk was clumsy  Psychiatric: He has a normal mood and affect. His behavior is normal.          Assessment & Plan:   Diabetes.  Recent diarrhea has resolved.  We'll continue  metformin therapy.  He will report any clinical change Peripheral neuropathy/unsteady gait  Return in 3 months as scheduled

## 2013-10-25 ENCOUNTER — Other Ambulatory Visit: Payer: Self-pay | Admitting: Internal Medicine

## 2013-11-21 ENCOUNTER — Ambulatory Visit (INDEPENDENT_AMBULATORY_CARE_PROVIDER_SITE_OTHER): Payer: Medicare Other | Admitting: Internal Medicine

## 2013-11-21 ENCOUNTER — Encounter: Payer: Self-pay | Admitting: Internal Medicine

## 2013-11-21 VITALS — BP 130/74 | Temp 98.7°F | Ht 71.25 in | Wt 223.0 lb

## 2013-11-21 DIAGNOSIS — E1149 Type 2 diabetes mellitus with other diabetic neurological complication: Secondary | ICD-10-CM

## 2013-11-21 DIAGNOSIS — E119 Type 2 diabetes mellitus without complications: Secondary | ICD-10-CM

## 2013-11-21 DIAGNOSIS — I252 Old myocardial infarction: Secondary | ICD-10-CM

## 2013-11-21 DIAGNOSIS — E1142 Type 2 diabetes mellitus with diabetic polyneuropathy: Secondary | ICD-10-CM

## 2013-11-21 DIAGNOSIS — I10 Essential (primary) hypertension: Secondary | ICD-10-CM

## 2013-11-21 DIAGNOSIS — E785 Hyperlipidemia, unspecified: Secondary | ICD-10-CM

## 2013-11-21 DIAGNOSIS — I2510 Atherosclerotic heart disease of native coronary artery without angina pectoris: Secondary | ICD-10-CM

## 2013-11-21 MED ORDER — NITROGLYCERIN 0.4 MG SL SUBL: 0.4000 mg | SUBLINGUAL_TABLET | SUBLINGUAL | Status: DC | PRN

## 2013-11-21 NOTE — Patient Instructions (Signed)
Limit your sodium (Salt) intake    It is important that you exercise regularly, at least 20 minutes 3 to 4 times per week.  If you develop chest pain or shortness of breath seek  medical attention.  Cardiology referral as discussed.  Report any further chest pain.   Refill nitroglycerin if needed   Please check your hemoglobin A1c every 3 months

## 2013-11-21 NOTE — Progress Notes (Signed)
Pre visit review using our clinic review tool, if applicable. No additional management support is needed unless otherwise documented below in the visit note. 

## 2013-11-21 NOTE — Progress Notes (Signed)
Subjective:    Patient ID: Richard Ho, male    DOB: 27-Nov-1940, 73 y.o.   MRN: [id number]  HPI  73 year old patient who is seen today for his quarterly evaluation. He has type 2 diabetes, which has been fairly stable.  His last hemoglobin A1c at 7 point 0.  There has been a slight upward trend.  Remains on metformin therapy only He has been followed by podiatry lately due to a ulceration involving the plantar surface of the left third toe.  He is soon to complete 3 weeks of antibiotic therapy with cephalexin. He has coronary artery disease.  Over the past 2 months.  He has had 2 or 3 episodes of chest tightness that usually occur at night.  Denies some exertional fatigue and shortness of breath, but no chest pain.  His last heart catheterization 2013 that revealed significant multivessel disease.  No recent cardiology followup  Past Medical History  Diagnosis Date  . Asthma   . Coronary artery disease 1991    a) Angioplasty LAD 1991 [place] b) MI in 1995 at  [facility] in [place], med Rx  . GERD (gastroesophageal reflux disease)   . Hyperlipidemia   . Myocardial infarct   . Nephrolithiasis   . Osteopenia   . HTN (hypertension)   . Diabetes mellitus   . Hearing loss     History   Social History  . Marital Status: Married    Spouse Name: N/A    Number of Children: N/A  . Years of Education: N/A   Occupational History  . Retired    Social History Main Topics  . Smoking status: Never Smoker   . Smokeless tobacco: Never Used  . Alcohol Use: No  . Drug Use: No  . Sexual Activity: Not on file   Other Topics Concern  . Not on file   Social History Narrative  . No narrative on file    Past Surgical History  Procedure Laterality Date  . Inguinal hernia repair    . Ptca    . Vasectomy    . Orif tibia & fibula fractures    . Angioplasty  1991  . Eye surgery  2010    cataract - right  . Cholecystectomy  2012  . Cardiac catheterization  2005    Moderate  LCx and LAD disease and severe diffuse RCA disease  . Cardiac catheterization  01/2012    normal left main, 50% pLAD stenosis, dLAD mild luminal irregularities, D1 30-40% stenosis at take off; subtotally occluded/severely diseased OM1, mid 80% stenosis in small OM2, long prox and mid RCA stenoses ranging from 60-95%, severely diseased PDA, distal PLSA occluded filling via L-R collateral; normal LV function; inferior lateral basal akinesis.    Family History  Problem Relation Age of Onset  . Asthma Mother   . Allergies Sister   . Coronary artery disease Other     Allergies  Allergen Reactions  . Meloxicam Other (See Comments)    Severe gastritis    Current Outpatient Prescriptions on File Prior to Visit  Medication Sig Dispense Refill  . albuterol (PROVENTIL HFA;VENTOLIN HFA) 108 (90 BASE) MCG/ACT inhaler Inhale 2 puffs into the lungs every 4 (four) hours as needed. For shortness of breath      . atenolol (TENORMIN) 50 MG tablet Take 50 mg by mouth 2 (two) times daily.      . Cholecalciferol (VITAMIN D) 2000 UNITS CAPS Take 2,000 Units by mouth daily.       .Marland Kitchen  fluticasone (FLONASE) 50 MCG/ACT nasal spray Place 2 sprays into both nostrils daily.  16 g  6  . Hyoscyamine Sulfate (HYOMAX-DT) 0.375 MG TBCR Take 1-2 tablets by mouth daily as needed. For irritable bowel syndrome      . metFORMIN (GLUCOPHAGE) 1000 MG tablet Take 1 tablet (1,000 mg total) by mouth 2 (two) times daily with a meal.  180 tablet  3  . Multiple Vitamin (MULTIVITAMIN) tablet Take 1 tablet by mouth daily.        . nitroGLYCERIN (NITROSTAT) 0.4 MG SL tablet Place 1 tablet (0.4 mg total) under the tongue every 5 (five) minutes as needed. Chest pain  25 tablet  3  . nortriptyline (PAMELOR) 25 MG capsule TAKE ONE CAPSULE BY MOUTH AT BEDTIME  90 capsule  1  . pantoprazole (PROTONIX) 40 MG tablet Take 40 mg by mouth daily. Take one half hour before eating.       Marland Kitchen. PRECISION XTRA TEST STRIPS test strip USE ONE STRIP ONCE DAILY   100 each  3  . ramipril (ALTACE) 5 MG capsule TAKE ONE CAPSULE BY MOUTH ONCE DAILY  90 capsule  3  . simvastatin (ZOCOR) 80 MG tablet Take 40 mg by mouth at bedtime.       . ferrous sulfate 325 (65 FE) MG tablet Take 325 mg by mouth daily with breakfast.      . isosorbide mononitrate (IMDUR) 30 MG 24 hr tablet Take 1 tablet (30 mg total) by mouth daily.  90 tablet  3  . levothyroxine (LEVOTHROID) 25 MCG tablet Take 1 tablet (25 mcg total) by mouth daily.  90 tablet  6   No current facility-administered medications on file prior to visit.    BP 130/74  Temp(Src) 98.7 F (37.1 C) (Oral)  Ht 5' 11.25" (1.81 m)  Wt 223 lb (101.152 kg)  BMI 30.88 kg/m2       Review of Systems  Constitutional: Negative for fever, chills, appetite change and fatigue.  HENT: Negative for congestion, dental problem, ear pain, hearing loss, sore throat, tinnitus, trouble swallowing and voice change.   Eyes: Negative for pain, discharge and visual disturbance.  Respiratory: Negative for cough, chest tightness, wheezing and stridor.   Cardiovascular: Positive for chest pain. Negative for palpitations and leg swelling.  Gastrointestinal: Negative for nausea, vomiting, abdominal pain, diarrhea, constipation, blood in stool and abdominal distention.  Genitourinary: Negative for urgency, hematuria, flank pain, discharge, difficulty urinating and genital sores.  Musculoskeletal: Negative for arthralgias, back pain, gait problem, joint swelling, myalgias and neck stiffness.  Skin: Positive for wound. Negative for rash.  Neurological: Negative for dizziness, syncope, speech difficulty, weakness, numbness and headaches.  Hematological: Negative for adenopathy. Does not bruise/bleed easily.  Psychiatric/Behavioral: Negative for behavioral problems and dysphoric mood. The patient is not nervous/anxious.        Objective:   Physical Exam  Constitutional: He is oriented to person, place, and time. He appears  well-developed.  HENT:  Head: Normocephalic.  Right Ear: External ear normal.  Left Ear: External ear normal.  Eyes: Conjunctivae and EOM are normal.  Neck: Normal range of motion.  Cardiovascular: Normal rate, normal heart sounds and intact distal pulses.   Pulmonary/Chest: Breath sounds normal.  Abdominal: Bowel sounds are normal.  Musculoskeletal: Normal range of motion. He exhibits no edema and no tenderness.  Neurological: He is alert and oriented to person, place, and time.  Skin:  Noninflamed largely healed.  The small plantar ulcer, left third toe Pedal  pulses are full Impaired.  Sensory exam with absent vibratory sensation and decreased monofilament testing  Psychiatric: He has a normal mood and affect. His behavior is normal.          Assessment & Plan:   Diabetes mellitus.  We'll check a hemoglobin A1c Coronary artery disease.  Patient has had 2 episodes of possible angina.  Will refill nitroglycerin and schedule cardiology followup Diabetic peripheral neuropathy Resolving ulcer, left third toe  Recheck 3 months

## 2014-01-09 ENCOUNTER — Encounter: Payer: Self-pay | Admitting: Cardiovascular Disease

## 2014-01-09 ENCOUNTER — Ambulatory Visit (INDEPENDENT_AMBULATORY_CARE_PROVIDER_SITE_OTHER): Payer: Medicare Other | Admitting: Cardiovascular Disease

## 2014-01-09 VITALS — BP 130/52 | HR 90 | Ht 71.0 in | Wt 223.0 lb

## 2014-01-09 DIAGNOSIS — E785 Hyperlipidemia, unspecified: Secondary | ICD-10-CM

## 2014-01-09 DIAGNOSIS — I2510 Atherosclerotic heart disease of native coronary artery without angina pectoris: Secondary | ICD-10-CM

## 2014-01-09 DIAGNOSIS — I10 Essential (primary) hypertension: Secondary | ICD-10-CM

## 2014-01-09 DIAGNOSIS — R079 Chest pain, unspecified: Secondary | ICD-10-CM

## 2014-01-09 NOTE — Progress Notes (Signed)
History of Present Illness: 73 yo male with history of CAD, HTN, HLD, DM here today for cardiac follow up. He is followed by Dr. Shirlee LatchMcLean. He is added onto my schedule by mistake today. He has a history of angioplasty in [place] and in [place] in the 1990s. Cardiac cath in 2005 showed severe, diffuse RCA system disease. He was admitted in August 2013 with exertional chest pain and dyspnea. He had a left heart cath showing significant disease in small OMs as well as severe diffuse RCA system disease, similar to the prior cath. He was managed medically as there were not good interventional targets in the RCA system. He was started on Imdur.   He tells me today that he has been having an ache in his chest. This typically occurs when he is lying in bed or on his side. He also has chest pressure when climbing stairs. There is exertional dyspnea. He has been dealing with an infection on his third toe of left foot.   Primary Care Physician: Amador CunasKwiatkowski  Last Lipid Profile:Lipid Panel     Component Value Date/Time   CHOL 139 02/21/2013 0859   TRIG 150.0* 02/21/2013 0859   HDL 39.50 02/21/2013 0859   CHOLHDL 4 02/21/2013 0859   VLDL 30.0 02/21/2013 0859   LDLCALC 70 02/21/2013 0859     Past Medical History  Diagnosis Date  . Asthma   . Coronary artery disease 1991    a) Angioplasty LAD 1991 [place] b) MI in 1995 at  [facility] in [place], med Rx  . GERD (gastroesophageal reflux disease)   . Hyperlipidemia   . Myocardial infarct   . Nephrolithiasis   . Osteopenia   . HTN (hypertension)   . Diabetes mellitus   . Hearing loss     Past Surgical History  Procedure Laterality Date  . Inguinal hernia repair    . Ptca    . Vasectomy    . Orif tibia & fibula fractures    . Angioplasty  1991  . Eye surgery  2010    cataract - right  . Cholecystectomy  2012  . Cardiac catheterization  2005    Moderate LCx and LAD disease and severe diffuse RCA disease  . Cardiac catheterization   01/2012    normal left main, 50% pLAD stenosis, dLAD mild luminal irregularities, D1 30-40% stenosis at take off; subtotally occluded/severely diseased OM1, mid 80% stenosis in small OM2, long prox and mid RCA stenoses ranging from 60-95%, severely diseased PDA, distal PLSA occluded filling via L-R collateral; normal LV function; inferior lateral basal akinesis.    Current Outpatient Prescriptions  Medication Sig Dispense Refill  . albuterol (PROVENTIL HFA;VENTOLIN HFA) 108 (90 BASE) MCG/ACT inhaler Inhale 2 puffs into the lungs every 4 (four) hours as needed. For shortness of breath      . aspirin EC 81 MG tablet Take 81 mg by mouth daily.      Marland Kitchen. atenolol (TENORMIN) 50 MG tablet Take 50 mg by mouth 2 (two) times daily.      . Cholecalciferol (VITAMIN D) 2000 UNITS CAPS Take 2,000 Units by mouth daily.       . fluticasone (FLONASE) 50 MCG/ACT nasal spray Place 2 sprays into both nostrils daily.  16 g  6  . gabapentin (NEURONTIN) 300 MG capsule Take 900 mg by mouth 3 (three) times daily. Per [facility] takes 300mg  qam and 900mg  qpm      . Hyoscyamine Sulfate (HYOMAX-DT) 0.375 MG  TBCR Take 1-2 tablets by mouth daily as needed. For irritable bowel syndrome      . metFORMIN (GLUCOPHAGE) 1000 MG tablet Take 1 tablet (1,000 mg total) by mouth 2 (two) times daily with a meal.  180 tablet  3  . Multiple Vitamin (MULTIVITAMIN) tablet Take 1 tablet by mouth daily.        . nitroGLYCERIN (NITROSTAT) 0.4 MG SL tablet Place 1 tablet (0.4 mg total) under the tongue every 5 (five) minutes as needed. Chest pain  25 tablet  3  . nortriptyline (PAMELOR) 25 MG capsule TAKE ONE CAPSULE BY MOUTH AT BEDTIME  90 capsule  1  . pantoprazole (PROTONIX) 40 MG tablet Take 40 mg by mouth daily. Take one half hour before eating.       Marland Kitchen PRECISION XTRA TEST STRIPS test strip USE ONE STRIP ONCE DAILY  100 each  3  . ramipril (ALTACE) 5 MG capsule TAKE ONE CAPSULE BY MOUTH ONCE DAILY  90 capsule  3  . simvastatin (ZOCOR) 80 MG tablet  Take 40 mg by mouth at bedtime.       . isosorbide mononitrate (IMDUR) 30 MG 24 hr tablet Take 1 tablet (30 mg total) by mouth daily.  90 tablet  3  . levothyroxine (LEVOTHROID) 25 MCG tablet Take 1 tablet (25 mcg total) by mouth daily.  90 tablet  6   No current facility-administered medications for this visit.    Allergies  Allergen Reactions  . Meloxicam Other (See Comments)    Severe gastritis    History   Social History  . Marital Status: Married    Spouse Name: N/A    Number of Children: N/A  . Years of Education: N/A   Occupational History  . Retired    Social History Main Topics  . Smoking status: Never Smoker   . Smokeless tobacco: Never Used  . Alcohol Use: No  . Drug Use: No  . Sexual Activity: Not on file   Other Topics Concern  . Not on file   Social History Narrative  . No narrative on file    Family History  Problem Relation Age of Onset  . Asthma Mother   . Allergies Sister   . Coronary artery disease Other     Review of Systems:  As stated in the HPI and otherwise negative.   BP 130/52  Pulse 90  Ht 5\' 11"  (1.803 m)  Wt 223 lb (101.152 kg)  BMI 31.12 kg/m2  Physical Examination: General: Well developed, well nourished, NAD HEENT: OP clear, mucus membranes moist SKIN: warm, dry. No rashes. Neuro: No focal deficits Musculoskeletal: Muscle strength 5/5 all ext Psychiatric: Mood and affect normal Neck: No JVD, no carotid bruits, no thyromegaly, no lymphadenopathy. Lungs:Clear bilaterally, no wheezes, rhonci, crackles Cardiovascular: Regular rate and rhythm. No murmurs, gallops or rubs. Abdomen:Soft. Bowel sounds present. Non-tender.  Extremities: No lower extremity edema. Pulses are 2 + in the bilateral DP/PT.  Cardiac cath 01/28/12: Left Main: normal  Left anterior Descending: Proximal LAD has a 50% stenosis. The distal LAD has minor luminal irregularities. The 1st diagonal has a 30-40% stenosis at the take off.  Left Circumflex: Large  vessel that trifurcates quickly into 3 branches. The 1st OM is severely diseased and is subtotally occluded. The OM 2 is small and has a mid 80% stenosis. The continuation branch is small and provides collateral flow the the distal RCA. The OM1 is tighter than his cath in 2005.  Right Coronary  Artery: large and dominant. There is a long stenosis in the proximal and mid segments that ranges from 60% stenosis in the prox region and is 95% stenosed in the mid region. There are mild - moderate diffuse in the distal region. The PDA is severely diseased in the mid segment. The distal PLSA is occluded and fills via Left to right collaterals. The entire RCA is basically unchanged from his previous cath in 2005.  LV Gram: Normal LV function. Inferior lateral basal akinesis.   EKG: sinus, rate 90 bpm. PVCs.   Assessment and Plan:   1. CAD: He is known to have diffuse CAD that has been stable for years. Recent chest pains while at rest. Will arrange Lexiscan stress myoview to exclude ischemia. Continue ASA, statin, beta blocker, Ace-inh.   2. HTN: BP well controlled. No changes today.   3. HLD: Continue statin. Lipids well controlled.   4. Chest pain: See above.

## 2014-01-09 NOTE — Patient Instructions (Signed)
Your physician wants you to follow-up in: 6 months.   You will receive a reminder letter in the mail two months in advance. If you don't receive a letter, please call our office to schedule the follow-up appointment.  Your physician has requested that you have a lexiscan myoview. For further information please visit [email or web address]. Please follow instruction sheet, as given.   

## 2014-01-12 ENCOUNTER — Other Ambulatory Visit: Payer: Self-pay | Admitting: Internal Medicine

## 2014-01-20 ENCOUNTER — Encounter: Payer: Self-pay | Admitting: Family Medicine

## 2014-01-20 ENCOUNTER — Ambulatory Visit (INDEPENDENT_AMBULATORY_CARE_PROVIDER_SITE_OTHER): Payer: Medicare Other | Admitting: Family Medicine

## 2014-01-20 VITALS — BP 140/62 | HR 60 | Temp 98.0°F | Ht 71.0 in | Wt 221.0 lb

## 2014-01-20 DIAGNOSIS — R42 Dizziness and giddiness: Secondary | ICD-10-CM

## 2014-01-20 DIAGNOSIS — L749 Eccrine sweat disorder, unspecified: Secondary | ICD-10-CM

## 2014-01-20 DIAGNOSIS — I2510 Atherosclerotic heart disease of native coronary artery without angina pectoris: Secondary | ICD-10-CM

## 2014-01-20 DIAGNOSIS — L748 Other eccrine sweat disorders: Secondary | ICD-10-CM

## 2014-01-20 LAB — BASIC METABOLIC PANEL
BUN: 25 mg/dL — ABNORMAL HIGH (ref 6–23)
CALCIUM: 10.6 mg/dL — AB (ref 8.4–10.5)
CO2: 24 meq/L (ref 19–32)
Chloride: 99 mEq/L (ref 96–112)
Creatinine, Ser: 1.6 mg/dL — ABNORMAL HIGH (ref 0.4–1.5)
GFR: 45.59 mL/min — ABNORMAL LOW (ref >60.00)
Glucose, Bld: 92 mg/dL (ref 70–99)
POTASSIUM: 5.3 meq/L — AB (ref 3.5–5.1)
SODIUM: 134 meq/L — AB (ref 135–145)

## 2014-01-20 LAB — TSH: TSH: 2.93 u[IU]/mL (ref 0.35–4.50)

## 2014-01-20 NOTE — Progress Notes (Signed)
Tana ConchStephen Hunter, MD Phone: [phone]  Subjective:   Richard [name] is a 73 y.o. year old very pleasant male patient who presents with the following:  Vertigo Excess sweating 10:30 PM walking in the apartment and suddenly started feeling the room spin after turning his head. This lasted 2 minutes or less. Started getting nauseous and sweaty. Has been sweatier in recent days but working much harder than normal as he has been renovating the house. Checked his blood sugar and 206 after he drank orange juice.  Never happened to him before. Blood sugar came back down this morning. He does admit to sweating more over last 1-2 weeks which concerns him. He does have a stress test coming up next Monday.  ROS- No changes in baseline chest pain or shortness of breath since evaluation by cardiology on August 3. Admits to nocturia 2-4 x a night over last 1.5 months but fasting CBGs 125. No recent colds/coughs/URI symptoms. Denies extremity weakness or difficulty swallowing or difficulty word finding or speaking.  Past Medical History-type II diabetes well controlled with last A1c of 7. Hypothyroidism. asthma. Hypertension hyperlipidemia. History of MI. History of CAD.  Medications- reviewed and updated Current Outpatient Prescriptions  Medication Sig Dispense Refill  . albuterol (PROVENTIL HFA;VENTOLIN HFA) 108 (90 BASE) MCG/ACT inhaler Inhale 2 puffs into the lungs every 4 (four) hours as needed. For shortness of breath      . aspirin EC 81 MG tablet Take 81 mg by mouth daily.      Marland Kitchen. atenolol (TENORMIN) 50 MG tablet Take 50 mg by mouth 2 (two) times daily.      . Cholecalciferol (VITAMIN D) 2000 UNITS CAPS Take 2,000 Units by mouth daily.       . fluticasone (FLONASE) 50 MCG/ACT nasal spray Place 2 sprays into both nostrils daily.  16 g  6  . gabapentin (NEURONTIN) 300 MG capsule Take 900 mg by mouth 3 (three) times daily. [facility] takes 300mg  qam and 900mg  qpm      . Hyoscyamine Sulfate (HYOMAX-DT)  0.375 MG TBCR Take 1-2 tablets by mouth daily as needed. For irritable bowel syndrome      . metFORMIN (GLUCOPHAGE) 1000 MG tablet TAKE ONE TABLET BY MOUTH TWICE DAILY. TAKE WITH A MEAL.  180 tablet  0  . Multiple Vitamin (MULTIVITAMIN) tablet Take 1 tablet by mouth daily.        . nortriptyline (PAMELOR) 25 MG capsule TAKE ONE CAPSULE BY MOUTH AT BEDTIME  90 capsule  1  . pantoprazole (PROTONIX) 40 MG tablet Take 40 mg by mouth daily. Take one half hour before eating.       Marland Kitchen. PRECISION XTRA TEST STRIPS test strip USE ONE STRIP ONCE DAILY  100 each  3  . ramipril (ALTACE) 5 MG capsule TAKE ONE CAPSULE BY MOUTH ONCE DAILY  90 capsule  3  . simvastatin (ZOCOR) 80 MG tablet Take 40 mg by mouth at bedtime.       . isosorbide mononitrate (IMDUR) 30 MG 24 hr tablet Take 1 tablet (30 mg total) by mouth daily.  90 tablet  3  . levothyroxine (LEVOTHROID) 25 MCG tablet Take 1 tablet (25 mcg total) by mouth daily.  90 tablet  6  . nitroGLYCERIN (NITROSTAT) 0.4 MG SL tablet Place 1 tablet (0.4 mg total) under the tongue every 5 (five) minutes as needed. Chest pain  25 tablet  3   No current facility-administered medications for this visit.    Objective: BP  140/62  Pulse 60  Temp(Src) 98 F (36.7 C)  Ht 5\' 11"  (1.803 m)  Wt 221 lb (100.245 kg)  BMI 30.84 kg/m2 Gen: NAD, resting comfortably in chair HEENT: Nasal turbinates normal. Tympanic membrane normal.  oropharynx normal without pharyngeal exudate Mucous membranes are moist. NCAT. PERRLA.  CV: RRR no mrg  Lungs: CTAB  Abd: soft/nontender/nondistended/normal bowel sounds  MSK: moves all extremities, no edema  Skin: warm and dry, no rash  Neuro: CN II-XII intact, sensation and reflexes normal throughout, 5/5 muscle strength in bilateral upper and lower extremities. Normal finger to nose. Normal rapid alternating movements. I cannot reproduce symptoms with head thrust.   Assessment/Plan:  Vertigo Possibly related to BPPV. Doubt TIA (short  duration, produced by turning of head). Do not think symptoms were related to a1c. Chronic tinnitus but no recent change and no changing in hearing to suggest Meniere's. Episode shorter than expected with vestibular neuritis. No history of migraines. No recent medication changes. Follow up if recurs. Patient is having wife drive him for now and he knows he can try benadryl if symptoms recur.   BPH Patient admits to symptoms concerning for BPH. Told patient wanted to defer treatment decisions for now as BPH medications can cause orthostatic symptoms which would make following up vertigo more difficult. He plans to discuss with Dr. Kirtland Bouchard at next visit  Excess sweating Given history of CAD and increases in chest pain as followed by cardiology, Encouraged patient to follow through with stress testing. If he had worsening symptoms over weekend, encouraged to seek care immediately. Likely just due to deconditioning from being less active. Check TSH and BMET.   Orders Placed This Encounter  Procedures  . TSH    [place]  . Basic metabolic panel    [place]

## 2014-01-20 NOTE — Patient Instructions (Signed)
Room Spinning  May have been due to benign positional vertigo  Do not think it was related to blood sugar  See us back if it occurs again or if you have symptoms that start and persist, seek care in emergency room  We discussed stroke or mini stroke was a possibility but with time course thought this was unlikely.   Heavier sweating  Check thyroid and electrolytes today  Recommend you move forward with stress test  May be due to some deconditioning as you have been off your feet with your foot injury  If you were to have worsening sweating along with chest pain and shortness of breath, seek care immediately.   Thanks, Dr. Durene CalHunter

## 2014-01-23 ENCOUNTER — Ambulatory Visit (HOSPITAL_COMMUNITY): Payer: Medicare Other | Attending: Cardiology | Admitting: Radiology

## 2014-01-23 VITALS — BP 133/85 | Ht 71.0 in | Wt 218.0 lb

## 2014-01-23 DIAGNOSIS — R0609 Other forms of dyspnea: Secondary | ICD-10-CM | POA: Diagnosis not present

## 2014-01-23 DIAGNOSIS — R079 Chest pain, unspecified: Secondary | ICD-10-CM | POA: Diagnosis not present

## 2014-01-23 DIAGNOSIS — Z8249 Family history of ischemic heart disease and other diseases of the circulatory system: Secondary | ICD-10-CM | POA: Insufficient documentation

## 2014-01-23 DIAGNOSIS — E119 Type 2 diabetes mellitus without complications: Secondary | ICD-10-CM | POA: Diagnosis not present

## 2014-01-23 DIAGNOSIS — R0989 Other specified symptoms and signs involving the circulatory and respiratory systems: Secondary | ICD-10-CM | POA: Insufficient documentation

## 2014-01-23 DIAGNOSIS — I10 Essential (primary) hypertension: Secondary | ICD-10-CM | POA: Insufficient documentation

## 2014-01-23 DIAGNOSIS — I2510 Atherosclerotic heart disease of native coronary artery without angina pectoris: Secondary | ICD-10-CM

## 2014-01-23 DIAGNOSIS — R0602 Shortness of breath: Secondary | ICD-10-CM

## 2014-01-23 MED ORDER — TECHNETIUM TC 99M SESTAMIBI GENERIC - CARDIOLITE
30.0000 | Freq: Once | INTRAVENOUS | Status: AC | PRN
  Administered 2014-01-23: 30 via INTRAVENOUS

## 2014-01-23 MED ORDER — REGADENOSON 0.4 MG/5ML IV SOLN
0.4000 mg | Freq: Once | INTRAVENOUS | Status: AC
  Administered 2014-01-23: 0.4 mg via INTRAVENOUS

## 2014-01-23 MED ORDER — TECHNETIUM TC 99M SESTAMIBI GENERIC - CARDIOLITE
10.0000 | Freq: Once | INTRAVENOUS | Status: AC | PRN
  Administered 2014-01-23: 10 via INTRAVENOUS

## 2014-01-23 NOTE — Progress Notes (Signed)
[facility] SITE 3 NUCLEAR MED [address] [place] [place], [place] [address] [phone]    Cardiology Nuclear Med Study  Richard [name] is a 73 y.o. male     MRN : [id number]     DOB: Jan 17, 1941  Procedure Date: 01/23/2014  Nuclear Med Background Indication for Stress Test:  Evaluation for Ischemia and PTCA Patency History:  Hx of MI, PTCA Cardiac Risk Factors: Family History - CAD, Hypertension, Lipids and NIDDM  Symptoms:  Chest Pain and DOE   Nuclear Pre-Procedure Caffeine/Decaff Intake:  None NPO After: 8:00pm   Lungs:  clear O2 Sat: 96% on room air. IV 0.9% NS with Angio Cath:  22g  IV Site: R Forearm  IV Started by:  Cathlyn Parsonsynthia Hasspacher, RN  Chest Size (in):  42 Cup Size: n/a  Height: 5\' 11"  (1.803 m)  Weight:  218 lb (98.884 kg)  BMI:  Body mass index is 30.42 kg/(m^2). Tech Comments:  Atenolol taken @ 0700 today    Nuclear Med Study 1 or 2 day study: 1 day  Stress Test Type:  Lexiscan  Reading MD: n/a  Order Authorizing Provider:  Fransico Meadowalton McLean,MD  Resting Radionuclide: Technetium 3570m Sestamibi  Resting Radionuclide Dose: 11.0 mCi   Stress Radionuclide:  Technetium 2670m Sestamibi  Stress Radionuclide Dose: 33.0 mCi           Stress Protocol Rest HR: 64 Stress HR: 90  Rest BP: 133/85 Stress BP: 130/80  Exercise Time (min): 2.00 METS: 1.6   Predicted Max HR: 148 bpm % Max HR: 60.81 bpm Rate Pressure Product: [id number]   Dose of Adenosine (mg):  n/a Dose of Lexiscan: 0.4 mg  Dose of Atropine (mg): n/a Dose of Dobutamine: n/a mcg/kg/min (at max HR)  Stress Test Technologist: Bonnita LevanJackie Smith, RN  Nuclear Technologist:  Harlow AsaElizabeth Young, CNMT     Rest Procedure:  Myocardial perfusion imaging was performed at rest 45 minutes following the intravenous administration of Technetium 3070m Sestamibi. Rest ECG: NSR - Normal EKG  Stress Procedure:  The patient received IV Lexiscan 0.4 mg over 15-seconds with concurrent low level exercise and then Technetium 3470m Sestamibi  was injected at 30-seconds while the patient continued walking one more minute.  Quantitative spect images were obtained after a 45-minute delay. Stress ECG: No significant change from baseline ECG  QPS Raw Data Images:  There is interference from nuclear activity from structures below the diaphragm. This does not affect the ability to read the study. Stress Images:  There is decreased uptake in the inferior wall. Rest Images:  There is decreased uptake in the inferior wall. Subtraction (SDS):  These findings are consistent with ischemia. Transient Ischemic Dilatation (Normal <1.22):  1.33 Lung/Heart Ratio (Normal <0.45):  0.33  Quantitative Gated Spect Images QGS EDV:  119 ml QGS ESV:  62 ml  Impression Exercise Capacity:  Lexiscan with low level exercise. BP Response:  Normal blood pressure response. Clinical Symptoms:  No symptoms. ECG Impression:  No significant ECG changes with Lexiscan. Comparison with Prior Nuclear Study: No images to compare  Overall Impression:  Intermediate risk stress nuclear study with reversible inferior ischemia. There is underlying bowel, however, the SDS is 11. Extent 23%..  LV Ejection Fraction: 48%.  LV Wall Motion:  Mild inferior hypokinesis.  Richard NoseKenneth C. Xaiver Roskelley, MD, [facility] CenterFACC Board Certified in Nuclear Cardiology Attending Cardiologist [facility]

## 2014-01-24 ENCOUNTER — Other Ambulatory Visit: Payer: Self-pay | Admitting: Internal Medicine

## 2014-01-26 ENCOUNTER — Encounter: Payer: Self-pay | Admitting: Family Medicine

## 2014-01-26 ENCOUNTER — Ambulatory Visit (INDEPENDENT_AMBULATORY_CARE_PROVIDER_SITE_OTHER): Payer: Medicare Other | Admitting: Family Medicine

## 2014-01-26 VITALS — BP 150/88 | HR 60 | Temp 98.8°F | Wt 224.0 lb

## 2014-01-26 DIAGNOSIS — N183 Chronic kidney disease, stage 3 unspecified: Secondary | ICD-10-CM | POA: Insufficient documentation

## 2014-01-26 DIAGNOSIS — E1149 Type 2 diabetes mellitus with other diabetic neurological complication: Secondary | ICD-10-CM

## 2014-01-26 DIAGNOSIS — E119 Type 2 diabetes mellitus without complications: Secondary | ICD-10-CM

## 2014-01-26 DIAGNOSIS — I10 Essential (primary) hypertension: Secondary | ICD-10-CM

## 2014-01-26 LAB — CBC
HEMATOCRIT: 37.1 % — AB (ref 39.0–52.0)
Hemoglobin: 12.2 g/dL — ABNORMAL LOW (ref 13.0–17.0)
MCHC: 33 g/dL (ref 30.0–36.0)
MCV: 100.8 fl — AB (ref 78.0–100.0)
PLATELETS: 260 10*3/uL (ref 150.0–400.0)
RBC: 3.68 Mil/uL — ABNORMAL LOW (ref 4.22–5.81)
RDW: 13.4 % (ref 11.5–15.5)
WBC: 7 10*3/uL (ref 4.0–10.5)

## 2014-01-26 LAB — COMPREHENSIVE METABOLIC PANEL
ALK PHOS: 64 U/L (ref 39–117)
ALT: 41 U/L (ref 0–53)
AST: 34 U/L (ref 0–37)
Albumin: 3.9 g/dL (ref 3.5–5.2)
BILIRUBIN TOTAL: 0.6 mg/dL (ref 0.2–1.2)
BUN: 19 mg/dL (ref 6–23)
CO2: 27 mEq/L (ref 19–32)
Calcium: 9.6 mg/dL (ref 8.4–10.5)
Chloride: 103 mEq/L (ref 96–112)
Creatinine, Ser: 1.4 mg/dL (ref 0.4–1.5)
GFR: 53.68 mL/min — ABNORMAL LOW (ref >60.00)
GLUCOSE: 97 mg/dL (ref 70–99)
Potassium: 5.4 mEq/L — ABNORMAL HIGH (ref 3.5–5.1)
Sodium: 137 mEq/L (ref 135–145)
Total Protein: 7.1 g/dL (ref 6.0–8.3)

## 2014-01-26 LAB — LIPID PANEL
CHOL/HDL RATIO: 3
CHOLESTEROL: 151 mg/dL (ref 0–200)
HDL: 43.8 mg/dL (ref >39.00)
LDL Cholesterol: 69 mg/dL (ref 0–99)
NonHDL: 107.2
TRIGLYCERIDES: 190 mg/dL — AB (ref 0.0–149.0)
VLDL: 38 mg/dL (ref 0.0–40.0)

## 2014-01-26 LAB — HEMOGLOBIN A1C: Hgb A1c MFr Bld: 7 % — ABNORMAL HIGH (ref 4.6–6.5)

## 2014-01-26 NOTE — Patient Instructions (Addendum)
Kidneys  Ok to take 1/2 tab of metformin twice a day. I will call you if you labs show us that we should change this (highly doubt)  Avoid ibuprofen/motrin/aleve. Tylenol only as needed for pain.   Check other labs. Follow up with Dr. Kirtland BouchardK sometime soon to check in (perhaps after issues with cardiology resolved)

## 2014-01-26 NOTE — Assessment & Plan Note (Signed)
Has been very well controlled on metformin 1 g twice a day. With creatinine above 1.5 in recent measures, decision made in discussion with Dr. Kirtland BouchardK to half metformin to 500 mg twice a day. Recheck bmet today

## 2014-01-26 NOTE — Progress Notes (Signed)
Tana Conch, MD Phone: [phone]  Subjective:   Richard Ho is a 73 y.o. year old very pleasant male patient who presents with the following:  Chronic kidney disease stage III with hyperkalemia During last visit for sweatiness and vertigo patient had a bmet which showed potassium of 5.3 and creatinine 1.6. The patient's metformin was held for 5 days. Continued ACE inhibitor during this time. ROS-no palpitations no shortness breath no urine changes-still wants to return to talk to Dr. Kirtland Bouchard. about BPH  Past Medical History--type II diabetes well controlled with last A1c of 7. Hypothyroidism. asthma. Hypertension hyperlipidemia. History of MI. History of CAD.  Medications- reviewed and updated Current Outpatient Prescriptions  Medication Sig Dispense Refill  . aspirin EC 81 MG tablet Take 81 mg by mouth daily.      Marland Kitchen atenolol (TENORMIN) 50 MG tablet Take 50 mg by mouth 2 (two) times daily.      . Cholecalciferol (VITAMIN D) 2000 UNITS CAPS Take 2,000 Units by mouth daily.       . fluticasone (FLONASE) 50 MCG/ACT nasal spray Place 2 sprays into both nostrils daily.  16 g  6  . gabapentin (NEURONTIN) 300 MG capsule Take 900 mg by mouth 3 (three) times daily. [facility] takes 300mg  qam and 900mg  qpm      . Hyoscyamine Sulfate (HYOMAX-DT) 0.375 MG TBCR Take 1-2 tablets by mouth daily as needed. For irritable bowel syndrome      . Multiple Vitamin (MULTIVITAMIN) tablet Take 1 tablet by mouth daily.        . nortriptyline (PAMELOR) 25 MG capsule TAKE ONE CAPSULE BY MOUTH AT BEDTIME  90 capsule  1  . pantoprazole (PROTONIX) 40 MG tablet Take 40 mg by mouth daily. Take one half hour before eating.       Marland Kitchen PRECISION XTRA TEST STRIPS test strip USE ONE STRIP ONCE DAILY  100 each  3  . ramipril (ALTACE) 5 MG capsule TAKE ONE CAPSULE BY MOUTH ONCE DAILY  90 capsule  3  . simvastatin (ZOCOR) 80 MG tablet Take 40 mg by mouth at bedtime.       Marland Kitchen albuterol (PROVENTIL HFA;VENTOLIN HFA) 108 (90 BASE)  MCG/ACT inhaler Inhale 2 puffs into the lungs every 4 (four) hours as needed. For shortness of breath      . isosorbide mononitrate (IMDUR) 30 MG 24 hr tablet Take 1 tablet (30 mg total) by mouth daily.  90 tablet  3  . levothyroxine (LEVOTHROID) 25 MCG tablet Take 1 tablet (25 mcg total) by mouth daily.  90 tablet  6  . metFORMIN (GLUCOPHAGE) 1000 MG tablet TAKE ONE TABLET BY MOUTH TWICE DAILY. TAKE WITH A MEAL.  180 tablet  0  . nitroGLYCERIN (NITROSTAT) 0.4 MG SL tablet Place 1 tablet (0.4 mg total) under the tongue every 5 (five) minutes as needed. Chest pain  25 tablet  3   No current facility-administered medications for this visit.    Objective: BP 150/88  Pulse 60  Temp(Src) 98.8 F (37.1 C)  Wt 224 lb (101.606 kg) Gen: NAD, resting comfortably CV: RRR no murmurs rubs or gallops Lungs: CTAB no crackles, wheeze, rhonchi Ext: no edema  Assessment/Plan:  Diabetes type 2, controlled Has been very well controlled on metformin 1 g twice a day. With creatinine above 1.5 in recent measures, decision made in discussion with Dr. Kirtland Bouchard to half metformin to 500 mg twice a day. Recheck bmet today  Chronic kidney disease, stage III (moderate) Continue  ACE inhibitor. Noted hyperkalemia-may need to consider reducing ramipril but the patient does need this for renal protection. No potassium supplements. Avoid NSAIDs.   Patient may have upcoming cardiac catheterization and he can hold his metformin per cardiology instructions at that time  Patient requests yearly labs as already [place] bmet Orders Placed This Encounter  Procedures  . Hemoglobin A1c    [employer]  . CBC    [place]  . Comprehensive metabolic panel    [place]  . Lipid panel    [place]

## 2014-01-26 NOTE — Assessment & Plan Note (Signed)
Continue ACE inhibitor. Noted hyperkalemia-may need to consider reducing ramipril but the patient does need this for renal protection. No potassium supplements. Avoid NSAIDs.

## 2014-01-31 ENCOUNTER — Ambulatory Visit (INDEPENDENT_AMBULATORY_CARE_PROVIDER_SITE_OTHER): Payer: Medicare Other | Admitting: Cardiovascular Disease

## 2014-01-31 ENCOUNTER — Encounter: Payer: Self-pay | Admitting: Cardiovascular Disease

## 2014-01-31 VITALS — BP 150/70 | HR 85 | Ht 71.0 in | Wt 225.0 lb

## 2014-01-31 DIAGNOSIS — E785 Hyperlipidemia, unspecified: Secondary | ICD-10-CM

## 2014-01-31 DIAGNOSIS — I2510 Atherosclerotic heart disease of native coronary artery without angina pectoris: Secondary | ICD-10-CM

## 2014-01-31 DIAGNOSIS — I10 Essential (primary) hypertension: Secondary | ICD-10-CM

## 2014-01-31 MED ORDER — ISOSORBIDE MONONITRATE ER 30 MG PO TB24: 30.0000 mg | ORAL_TABLET | Freq: Two times a day (BID) | ORAL | Status: DC

## 2014-01-31 NOTE — Progress Notes (Signed)
History of Present Illness: 73 yo male with history of CAD, HTN, HLD, DM here today for cardiac follow up. He has been followed by Dr. Shirlee Latch but was added onto my schedule by mistake 01/09/14 with c/o chest pains. He has a history of angioplasty in [place] and in [place] in the 1990s. Cardiac cath in 2005 showed severe, diffuse RCA system disease. He was admitted in August 2013 with exertional chest pain and dyspnea. He had a left heart cath showing significant disease in small OMs as well as severe diffuse RCA system disease, similar to the prior cath. He was managed medically as there were not good interventional targets in the RCA system. He was started on Imdur 30 mg daily. With recent exertional chest pain, I arranged a stress myoview 01/23/14 which showed possible inferior ischemia, LVEF=48%. This is in the distribution of his known severe RCA disease that has been medically for years.   He is here today for follow up. He describes occasional chest pains. Overall feeling well. No dyspnea over last month.   Primary Care Physician: Amador Cunas  Last Lipid Profile:Lipid Panel     Component Value Date/Time   CHOL 151 01/26/2014 1134   TRIG 190.0* 01/26/2014 1134   HDL 43.80 01/26/2014 1134   CHOLHDL 3 01/26/2014 1134   VLDL 38.0 01/26/2014 1134   LDLCALC 69 01/26/2014 1134     Past Medical History  Diagnosis Date  . Asthma   . Coronary artery disease 1991    a) Angioplasty LAD 1991 [place] b) MI in 1995 at  [facility] in [place], med Rx  . GERD (gastroesophageal reflux disease)   . Hyperlipidemia   . Myocardial infarct   . Nephrolithiasis   . Osteopenia   . HTN (hypertension)   . Diabetes mellitus   . Hearing loss     Past Surgical History  Procedure Laterality Date  . Inguinal hernia repair    . Ptca    . Vasectomy    . Orif tibia & fibula fractures    . Angioplasty  1991  . Eye surgery  2010    cataract - right  . Cholecystectomy  2012  . Cardiac  catheterization  2005    Moderate LCx and LAD disease and severe diffuse RCA disease  . Cardiac catheterization  01/2012    normal left main, 50% pLAD stenosis, dLAD mild luminal irregularities, D1 30-40% stenosis at take off; subtotally occluded/severely diseased OM1, mid 80% stenosis in small OM2, long prox and mid RCA stenoses ranging from 60-95%, severely diseased PDA, distal PLSA occluded filling via L-R collateral; normal LV function; inferior lateral basal akinesis.    Current Outpatient Prescriptions  Medication Sig Dispense Refill  . albuterol (PROVENTIL HFA;VENTOLIN HFA) 108 (90 BASE) MCG/ACT inhaler Inhale 2 puffs into the lungs every 4 (four) hours as needed. For shortness of breath      . aspirin EC 81 MG tablet Take 81 mg by mouth daily.      Marland Kitchen atenolol (TENORMIN) 50 MG tablet Take 50 mg by mouth 2 (two) times daily.      . Cholecalciferol (VITAMIN D) 2000 UNITS CAPS Take 2,000 Units by mouth daily.       . fluticasone (FLONASE) 50 MCG/ACT nasal spray Place 2 sprays into both nostrils daily.  16 g  6  . gabapentin (NEURONTIN) 300 MG capsule Take 900 mg by mouth 3 (three) times daily. [facility] takes  qam and  qpm      .  Hyoscyamine Sulfate (HYOMAX-DT) 0.375 MG TBCR Take 1-2 tablets by mouth daily as needed. For irritable bowel syndrome      . metFORMIN (GLUCOPHAGE) 500 MG tablet Take by mouth 2 (two) times daily with a meal.      . Multiple Vitamin (MULTIVITAMIN) tablet Take 1 tablet by mouth daily.        . nitroGLYCERIN (NITROSTAT) 0.4 MG SL tablet Place 1 tablet (0.4 mg total) under the tongue every 5 (five) minutes as needed. Chest pain  25 tablet  3  . nortriptyline (PAMELOR) 25 MG capsule TAKE ONE CAPSULE BY MOUTH AT BEDTIME  90 capsule  1  . pantoprazole (PROTONIX) 40 MG tablet Take 40 mg by mouth daily. Take one half hour before eating.       Marland Kitchen PRECISION XTRA TEST STRIPS test strip USE ONE STRIP ONCE DAILY  100 each  3  . ramipril (ALTACE) 5 MG capsule TAKE ONE  CAPSULE BY MOUTH ONCE DAILY  90 capsule  0  . simvastatin (ZOCOR) 80 MG tablet Take 40 mg by mouth at bedtime.       . isosorbide mononitrate (IMDUR) 30 MG 24 hr tablet Take 1 tablet (30 mg total) by mouth daily.  90 tablet  3  . levothyroxine (LEVOTHROID) 25 MCG tablet Take 1 tablet (25 mcg total) by mouth daily.  90 tablet  6   No current facility-administered medications for this visit.    Allergies  Allergen Reactions  . Meloxicam Other (See Comments)    Severe gastritis    History   Social History  . Marital Status: Married    Spouse Name: N/A    Number of Children: N/A  . Years of Education: N/A   Occupational History  . Retired    Social History Main Topics  . Smoking status: Never Smoker   . Smokeless tobacco: Never Used  . Alcohol Use: No  . Drug Use: No  . Sexual Activity: Not on file   Other Topics Concern  . Not on file   Social History Narrative  . No narrative on file    Family History  Problem Relation Age of Onset  . Asthma Mother   . Allergies Sister   . Coronary artery disease Other     Review of Systems:  As stated in the HPI and otherwise negative.   BP 150/70  Pulse 85  Ht  (1.803 m)  Wt 225 lb (102.059 kg)  BMI 31.39 kg/m2  SpO2 97%  Physical Examination: General: Well developed, well nourished, NAD HEENT: OP clear, mucus membranes moist SKIN: warm, dry. No rashes. Neuro: No focal deficits Musculoskeletal: Muscle strength 5/5 all ext Psychiatric: Mood and affect normal Neck: No JVD, no carotid bruits, no thyromegaly, no lymphadenopathy. Lungs:Clear bilaterally, no wheezes, rhonci, crackles Cardiovascular: Regular rate and rhythm. No murmurs, gallops or rubs. Abdomen:Soft. Bowel sounds present. Non-tender.  Extremities: No lower extremity edema. Pulses are 2 + in the bilateral DP/PT.  Cardiac cath 01/28/12: Left Main: normal  Left anterior Descending: Proximal LAD has a 50% stenosis. The distal LAD has minor luminal  irregularities. The 1st diagonal has a 30-40% stenosis at the take off.  Left Circumflex: Large vessel that trifurcates quickly into 3 branches. The 1st OM is severely diseased and is subtotally occluded. The OM 2 is small and has a mid 80% stenosis. The continuation branch is small and provides collateral flow the the distal RCA. The OM1 is tighter than his cath in 2005.  Right Coronary Artery: large and dominant. There is a long stenosis in the proximal and mid segments that ranges from 60% stenosis in the prox region and is 95% stenosed in the mid region. There are mild - moderate diffuse in the distal region. The PDA is severely diseased in the mid segment. The distal PLSA is occluded and fills via Left to right collaterals. The entire RCA is basically unchanged from his previous cath in 2005.  LV Gram: Normal LV function. Inferior lateral basal akinesis.   Stress myoview 01/23/14:  Stress Procedure: The patient received IV Lexiscan 0.4 mg over 15-seconds with concurrent low level exercise and then Technetium 61m Sestamibi was injected at 30-seconds while the patient continued walking one more minute. Quantitative spect images were obtained after a 45-minute delay.  Stress ECG: No significant change from baseline ECG  QPS  Raw Data Images: There is interference from nuclear activity from structures below the diaphragm. This does not affect the ability to read the study.  Stress Images: There is decreased uptake in the inferior wall.  Rest Images: There is decreased uptake in the inferior wall.  Subtraction (SDS): These findings are consistent with ischemia.  Transient Ischemic Dilatation (Normal <1.22): 1.33  Lung/Heart Ratio (Normal <0.45): 0.33  Quantitative Gated Spect Images  QGS EDV: 119 ml  QGS ESV: 62 ml  Impression  Exercise Capacity: Lexiscan with low level exercise.  BP Response: Normal blood pressure response.  Clinical Symptoms: No symptoms.  ECG Impression: No significant ECG  changes with Lexiscan.  Comparison with Prior Nuclear Study: No images to compare  Overall Impression: Intermediate risk stress nuclear study with reversible inferior ischemia. There is underlying bowel, however, the SDS is 11. Extent 23%..  LV Ejection Fraction: 48%. LV Wall Motion: Mild inferior hypokinesis.   Assessment and Plan:   1. CAD/Unstable angina: He is known to have diffuse CAD that has been stable for years. Recent chest pains occurring occasionally with exertion. Stress myoview with possible inferior ischemia. We discussed arranging a cardiac cath but he wishes to try titration of medical therapy first. Will increase Imdur to 30 mg po BID. Continue ASA, statin, beta blocker, Ace-inh. If has worsening of his chest pain, we will discuss cardiac cath. Otherwise I will see him back in 6 weeks.   2. HTN: BP controlled at home. No changes today  3. HLD: Continue statin. Lipids well controlled.

## 2014-01-31 NOTE — Patient Instructions (Signed)
Your physician recommends that you schedule a follow-up appointment in: about 6 weeks.--Scheduled for March 07, 2014 at 10:45  Your physician has recommended you make the following change in your medication:  Increase Imdur to 30 mg by mouth twice daily.

## 2014-02-17 DIAGNOSIS — H04123 Dry eye syndrome of bilateral lacrimal glands: Secondary | ICD-10-CM | POA: Insufficient documentation

## 2014-02-17 DIAGNOSIS — H26493 Other secondary cataract, bilateral: Secondary | ICD-10-CM | POA: Insufficient documentation

## 2014-02-17 DIAGNOSIS — H43813 Vitreous degeneration, bilateral: Secondary | ICD-10-CM | POA: Insufficient documentation

## 2014-02-21 ENCOUNTER — Encounter: Payer: Self-pay | Admitting: Internal Medicine

## 2014-02-21 ENCOUNTER — Ambulatory Visit (INDEPENDENT_AMBULATORY_CARE_PROVIDER_SITE_OTHER): Payer: Medicare Other | Admitting: Internal Medicine

## 2014-02-21 VITALS — BP 142/90 | HR 75 | Temp 97.5°F | Resp 20 | Ht 71.0 in | Wt 226.0 lb

## 2014-02-21 DIAGNOSIS — I10 Essential (primary) hypertension: Secondary | ICD-10-CM

## 2014-02-21 DIAGNOSIS — E785 Hyperlipidemia, unspecified: Secondary | ICD-10-CM

## 2014-02-21 DIAGNOSIS — E1142 Type 2 diabetes mellitus with diabetic polyneuropathy: Secondary | ICD-10-CM

## 2014-02-21 DIAGNOSIS — N183 Chronic kidney disease, stage 3 unspecified: Secondary | ICD-10-CM

## 2014-02-21 DIAGNOSIS — Z23 Encounter for immunization: Secondary | ICD-10-CM

## 2014-02-21 DIAGNOSIS — E032 Hypothyroidism due to medicaments and other exogenous substances: Secondary | ICD-10-CM

## 2014-02-21 DIAGNOSIS — E1149 Type 2 diabetes mellitus with other diabetic neurological complication: Secondary | ICD-10-CM

## 2014-02-21 MED ORDER — TAMSULOSIN HCL 0.4 MG PO CAPS: 0.4000 mg | ORAL_CAPSULE | Freq: Every day | ORAL | Status: DC

## 2014-02-21 NOTE — Patient Instructions (Signed)
Limit your sodium (Salt) intake    It is important that you exercise regularly, at least 20 minutes 3 to 4 times per week.  If you develop chest pain or shortness of breath seek  medical attention.  You need to lose weight.  Consider a lower calorie diet and regular exercise. 

## 2014-02-21 NOTE — Progress Notes (Signed)
Pre visit review using our clinic review tool, if applicable. No additional management support is needed unless otherwise documented below in the visit note. 

## 2014-02-21 NOTE — Progress Notes (Signed)
Subjective:    Patient ID: Richard Ho, male    DOB: [birth year], 73 y.o.   MRN: [id number]  HPI  73 year old patient who was seen today for followup.  He has diabetes, consultative I., chronic kidney disease, and peripheral neuropathy. He has coronary artery disease and is followed closely by cardiology. He has had a recent eye examination and also scheduled for follow up the [facility]. He has hypertension and dyslipidemia. Complaints they include neuropathic pain involving his feet and also nocturia.  It interferes with sleep  Past Medical History  Diagnosis Date  . Asthma   . Coronary artery disease 1991    a) Angioplasty LAD 1991 [place] b) MI in 1995 at  [facility] in [place], med Rx  . GERD (gastroesophageal reflux disease)   . Hyperlipidemia   . Myocardial infarct   . Nephrolithiasis   . Osteopenia   . HTN (hypertension)   . Diabetes mellitus   . Hearing loss     History   Social History  . Marital Status: Married    Spouse Name: N/A    Number of Children: N/A  . Years of Education: N/A   Occupational History  . Retired    Social History Main Topics  . Smoking status: Never Smoker   . Smokeless tobacco: Never Used  . Alcohol Use: No  . Drug Use: No  . Sexual Activity: Not on file   Other Topics Concern  . Not on file   Social History Narrative  . No narrative on file    Past Surgical History  Procedure Laterality Date  . Inguinal hernia repair    . Ptca    . Vasectomy    . Orif tibia & fibula fractures    . Angioplasty  1991  . Eye surgery  2010    cataract - right  . Cholecystectomy  2012  . Cardiac catheterization  2005    Moderate LCx and LAD disease and severe diffuse RCA disease  . Cardiac catheterization  01/2012    normal left main, 50% pLAD stenosis, dLAD mild luminal irregularities, D1 30-40% stenosis at take off; subtotally occluded/severely diseased OM1, mid 80% stenosis in small OM2, long prox and mid RCA stenoses  ranging from 60-95%, severely diseased PDA, distal PLSA occluded filling via L-R collateral; normal LV function; inferior lateral basal akinesis.    Family History  Problem Relation Age of Onset  . Asthma Mother   . Allergies Sister   . Coronary artery disease Other     Allergies  Allergen Reactions  . Meloxicam Other (See Comments)    Severe gastritis    Current Outpatient Prescriptions on File Prior to Visit  Medication Sig Dispense Refill  . albuterol (PROVENTIL HFA;VENTOLIN HFA) 108 (90 BASE) MCG/ACT inhaler Inhale 2 puffs into the lungs every 4 (four) hours as needed. For shortness of breath      . aspirin EC 81 MG tablet Take 81 mg by mouth daily.      Marland Kitchen atenolol (TENORMIN) 50 MG tablet Take 50 mg by mouth 2 (two) times daily.      . Cholecalciferol (VITAMIN D) 2000 UNITS CAPS Take 2,000 Units by mouth daily.       . fluticasone (FLONASE) 50 MCG/ACT nasal spray Place 2 sprays into both nostrils daily.  16 g  6  . gabapentin (NEURONTIN) 300 MG capsule Take 900 mg by mouth 3 (three) times daily. [facility] takes  qam and  qpm      .  Hyoscyamine Sulfate (HYOMAX-DT) 0.375 MG TBCR Take 1-2 tablets by mouth daily as needed. For irritable bowel syndrome      . isosorbide mononitrate (IMDUR) 30 MG 24 hr tablet Take 1 tablet (30 mg total) by mouth 2 (two) times daily.  180 tablet  3  . metFORMIN (GLUCOPHAGE) 500 MG tablet Take by mouth 2 (two) times daily with a meal.      . Multiple Vitamin (MULTIVITAMIN) tablet Take 1 tablet by mouth daily.        . nitroGLYCERIN (NITROSTAT) 0.4 MG SL tablet Place 1 tablet (0.4 mg total) under the tongue every 5 (five) minutes as needed. Chest pain  25 tablet  3  . nortriptyline (PAMELOR) 25 MG capsule TAKE ONE CAPSULE BY MOUTH AT BEDTIME  90 capsule  1  . pantoprazole (PROTONIX) 40 MG tablet Take 40 mg by mouth daily. Take one half hour before eating.       Marland Kitchen PRECISION XTRA TEST STRIPS test strip USE ONE STRIP ONCE DAILY  100 each  3  . ramipril  (ALTACE) 5 MG capsule TAKE ONE CAPSULE BY MOUTH ONCE DAILY  90 capsule  0  . simvastatin (ZOCOR) 80 MG tablet Take 40 mg by mouth at bedtime.       Marland Kitchen levothyroxine (LEVOTHROID) 25 MCG tablet Take 1 tablet (25 mcg total) by mouth daily.  90 tablet  6   No current facility-administered medications on file prior to visit.    BP 142/90  Pulse 75  Temp(Src) 97.5 F (36.4 C) (Oral)  Resp 20  Ht  (1.803 m)  Wt 226 lb (102.513 kg)  BMI 31.53 kg/m2  SpO2 95%      Review of Systems  Constitutional: Negative for fever, chills, appetite change and fatigue.  HENT: Negative for congestion, dental problem, ear pain, hearing loss, sore throat, tinnitus, trouble swallowing and voice change.   Eyes: Negative for pain, discharge and visual disturbance.  Respiratory: Negative for cough, chest tightness, wheezing and stridor.   Cardiovascular: Negative for chest pain, palpitations and leg swelling.  Gastrointestinal: Negative for nausea, vomiting, abdominal pain, diarrhea, constipation, blood in stool and abdominal distention.  Genitourinary: Positive for frequency and discharge. Negative for urgency, hematuria, flank pain, difficulty urinating and genital sores.  Musculoskeletal: Negative for arthralgias, back pain, gait problem, joint swelling, myalgias and neck stiffness.  Skin: Negative for rash.  Neurological: Positive for numbness. Negative for dizziness, syncope, speech difficulty, weakness and headaches.  Hematological: Negative for adenopathy. Does not bruise/bleed easily.  Psychiatric/Behavioral: Negative for behavioral problems and dysphoric mood. The patient is not nervous/anxious.        Objective:   Physical Exam  Constitutional: He appears well-developed and well-nourished.  Blood pressure 138/80  HENT:  Head: Normocephalic and atraumatic.  Right Ear: External ear normal.  Left Ear: External ear normal.  Nose: Nose normal.  Mouth/Throat: Oropharynx is clear and moist.    Eyes: Conjunctivae and EOM are normal. Pupils are equal, round, and reactive to light. No scleral icterus.  Neck: Normal range of motion. Neck supple. No JVD present. No thyromegaly present.  Cardiovascular: Regular rhythm, normal heart sounds and intact distal pulses.  Exam reveals no gallop and no friction rub.   No murmur heard. Pulmonary/Chest: Effort normal and breath sounds normal. He exhibits no tenderness.  Abdominal: Soft. Bowel sounds are normal. He exhibits no distension and no mass. There is no tenderness.  Genitourinary: Prostate normal and penis normal.  Musculoskeletal: Normal range of motion.  He exhibits no edema and no tenderness.  Lymphadenopathy:    He has no cervical adenopathy.  Neurological: He is alert. He has normal reflexes. No cranial nerve deficit. Coordination normal.  Skin: Skin is warm and dry. No rash noted.  Psychiatric: He has a normal mood and affect. His behavior is normal.          Assessment & Plan:  Diabetes mellitus.  Will A1c 7.  There diet, weight loss, exercise, all, encouraged.  We'll recheck in 3 months, hypertension Coronary artery disease Diabetic peripheral neuropathy.  Continue Neurontin  Recheck 3 months

## 2014-03-07 ENCOUNTER — Encounter: Payer: Self-pay | Admitting: *Deleted

## 2014-03-07 ENCOUNTER — Ambulatory Visit (INDEPENDENT_AMBULATORY_CARE_PROVIDER_SITE_OTHER): Payer: Medicare Other | Admitting: Cardiovascular Disease

## 2014-03-07 ENCOUNTER — Encounter: Payer: Self-pay | Admitting: Cardiovascular Disease

## 2014-03-07 VITALS — BP 128/70 | HR 80 | Resp 12 | Ht 71.0 in | Wt 221.4 lb

## 2014-03-07 DIAGNOSIS — I10 Essential (primary) hypertension: Secondary | ICD-10-CM

## 2014-03-07 DIAGNOSIS — I200 Unstable angina: Secondary | ICD-10-CM

## 2014-03-07 DIAGNOSIS — I2510 Atherosclerotic heart disease of native coronary artery without angina pectoris: Secondary | ICD-10-CM

## 2014-03-07 DIAGNOSIS — I25110 Atherosclerotic heart disease of native coronary artery with unstable angina pectoris: Secondary | ICD-10-CM

## 2014-03-07 DIAGNOSIS — E785 Hyperlipidemia, unspecified: Secondary | ICD-10-CM

## 2014-03-07 DIAGNOSIS — R079 Chest pain, unspecified: Secondary | ICD-10-CM

## 2014-03-07 LAB — CBC
HEMATOCRIT: 35.9 % — AB (ref 39.0–52.0)
Hemoglobin: 11.8 g/dL — ABNORMAL LOW (ref 13.0–17.0)
MCHC: 32.7 g/dL (ref 30.0–36.0)
MCV: 102.2 fl — ABNORMAL HIGH (ref 78.0–100.0)
Platelets: 263 10*3/uL (ref 150.0–400.0)
RBC: 3.51 Mil/uL — ABNORMAL LOW (ref 4.22–5.81)
RDW: 13.8 % (ref 11.5–15.5)
WBC: 7.3 10*3/uL (ref 4.0–10.5)

## 2014-03-07 LAB — BASIC METABOLIC PANEL
BUN: 20 mg/dL (ref 6–23)
CALCIUM: 9.9 mg/dL (ref 8.4–10.5)
CO2: 26 meq/L (ref 19–32)
Chloride: 104 mEq/L (ref 96–112)
Creatinine, Ser: 1.4 mg/dL (ref 0.4–1.5)
GFR: 53.22 mL/min — ABNORMAL LOW (ref >60.00)
Glucose, Bld: 98 mg/dL (ref 70–99)
Potassium: 4.7 mEq/L (ref 3.5–5.1)
Sodium: 136 mEq/L (ref 135–145)

## 2014-03-07 LAB — PROTIME-INR
INR: 1 ratio (ref 0.8–1.0)
PROTHROMBIN TIME: 10.7 s (ref 9.6–13.1)

## 2014-03-07 NOTE — Progress Notes (Signed)
History of Present Illness: 73 yo male with history of CAD, HTN, HLD, DM here today for cardiac follow up. He has been followed by Dr. Shirlee LatchMcLean but was added onto my schedule by mistake 01/09/14 with c/o chest pains. He has a history of angi[place] in [place] and in [place] in the 1990s. Cardiac cath in 2005 showed severe, diffuse RCA system disease. He was admitted in August 2013 with exertional chest pain and dyspnea. He had a left heart cath showing significant disease in small OMs as well as severe diffuse RCA system disease, similar to the prior cath. He was managed medically as there were not good interventional targets in the RCA system. He was started on Imdur 30 mg daily. With recent exertional chest pain, I arranged a stress myoview 01/23/14 which showed possible inferior ischemia, LVEF=48%. This is in the distribution of his known severe RCA disease that has been medically for years. I increased his Imdur and discussed possible repeat cath if this did not help with his pain.   He is here today for follow up. Still having episodes of weakness and chest pressure with exertion, dyspnea with exertion.   Primary Care Physician: Amador CunasKwiatkowski  Last Lipid Profile:Lipid Panel     Component Value Date/Time   CHOL 151 01/26/2014 1134   TRIG 190.0* 01/26/2014 1134   HDL 43.80 01/26/2014 1134   CHOLHDL 3 01/26/2014 1134   VLDL 38.0 01/26/2014 1134   LDLCALC 69 01/26/2014 1134     Past Medical History  Diagnosis Date  . Asthma   . Coronary artery disease 1991    a) Angioplasty LAD 1991 [place] b) MI in 1995 at  [facility] in [place], med Rx  . GERD (gastroesophageal reflux disease)   . Hyperlipidemia   . Myocardial infarct   . Nephrolithiasis   . Osteopenia   . HTN (hypertension)   . Diabetes mellitus   . Hearing loss     Past Surgical History  Procedure Laterality Date  . Inguinal hernia repair    . Ptca    . Vasectomy    . Orif tibia & fibula fractures    . Angioplasty   1991  . Eye surgery  2010    cataract - right  . Cholecystectomy  2012  . Cardiac catheterization  2005    Moderate LCx and LAD disease and severe diffuse RCA disease  . Cardiac catheterization  01/2012    normal left main, 50% pLAD stenosis, dLAD mild luminal irregularities, D1 30-40% stenosis at take off; subtotally occluded/severely diseased OM1, mid 80% stenosis in small OM2, long prox and mid RCA stenoses ranging from 60-95%, severely diseased PDA, distal PLSA occluded filling via L-R collateral; normal LV function; inferior lateral basal akinesis.    Current Outpatient Prescriptions  Medication Sig Dispense Refill  . albuterol (PROVENTIL HFA;VENTOLIN HFA) 108 (90 BASE) MCG/ACT inhaler Inhale 2 puffs into the lungs every 4 (four) hours as needed. For shortness of breath      . atenolol (TENORMIN) 50 MG tablet Take 50 mg by mouth 2 (two) times daily.      . Cholecalciferol (VITAMIN D) 2000 UNITS CAPS Take 2,000 Units by mouth daily.       . fluticasone (FLONASE) 50 MCG/ACT nasal spray Place 2 sprays into both nostrils daily.  16 g  6  . gabapentin (NEURONTIN) 300 MG capsule Take 900 mg by mouth 3 (three) times daily. [facility] takes 300mg  qam and 900mg  qpm      .  Hyoscyamine Sulfate (HYOMAX-DT) 0.375 MG TBCR Take 1-2 tablets by mouth daily as needed. For irritable bowel syndrome      . isosorbide mononitrate (IMDUR) 30 MG 24 hr tablet Take 1 tablet (30 mg total) by mouth 2 (two) times daily.  180 tablet  3  . metFORMIN (GLUCOPHAGE) 500 MG tablet Take by mouth 2 (two) times daily with a meal.      . Multiple Vitamin (MULTIVITAMIN) tablet Take 1 tablet by mouth daily.        . nitroGLYCERIN (NITROSTAT) 0.4 MG SL tablet Place 1 tablet (0.4 mg total) under the tongue every 5 (five) minutes as needed. Chest pain  25 tablet  3  . nortriptyline (PAMELOR) 25 MG capsule TAKE ONE CAPSULE BY MOUTH AT BEDTIME  90 capsule  1  . pantoprazole (PROTONIX) 40 MG tablet Take 40 mg by mouth daily. Take one half  hour before eating.       Marland Kitchen PRECISION XTRA TEST STRIPS test strip USE ONE STRIP ONCE DAILY  100 each  3  . ramipril (ALTACE) 5 MG capsule TAKE ONE CAPSULE BY MOUTH ONCE DAILY  90 capsule  0  . simvastatin (ZOCOR) 80 MG tablet Take 40 mg by mouth at bedtime.       . tamsulosin (FLOMAX) 0.4 MG CAPS capsule Take 1 capsule (0.4 mg total) by mouth daily.  30 capsule  3  . levothyroxine (LEVOTHROID) 25 MCG tablet Take 1 tablet (25 mcg total) by mouth daily.  90 tablet  6   No current facility-administered medications for this visit.    Allergies  Allergen Reactions  . Meloxicam Other (See Comments)    Pt feels anxiety attacks when on this medicine    History   Social History  . Marital Status: Married    Spouse Name: N/A    Number of Children: N/A  . Years of Education: N/A   Occupational History  . Retired    Social History Main Topics  . Smoking status: Never Smoker   . Smokeless tobacco: Never Used  . Alcohol Use: No  . Drug Use: No  . Sexual Activity: Not on file   Other Topics Concern  . Not on file   Social History Narrative  . No narrative on file    Family History  Problem Relation Age of Onset  . Asthma Mother   . Allergies Sister   . Coronary artery disease Other     Review of Systems:  As stated in the HPI and otherwise negative.   BP 128/70  Pulse 50  Ht 5\' 11"  (1.803 m)  Wt 221 lb 6.4 oz (100.426 kg)  BMI 30.89 kg/m2  SpO2 97%  Physical Examination: General: Well developed, well nourished, NAD HEENT: OP clear, mucus membranes moist SKIN: warm, dry. No rashes. Neuro: No focal deficits Musculoskeletal: Muscle strength 5/5 all ext Psychiatric: Mood and affect normal Neck: No JVD, no carotid bruits, no thyromegaly, no lymphadenopathy. Lungs:Clear bilaterally, no wheezes, rhonci, crackles Cardiovascular: Regular rate and rhythm. No murmurs, gallops or rubs. Abdomen:Soft. Bowel sounds present. Non-tender.  Extremities: No lower extremity edema.  Pulses are 2 + in the bilateral DP/PT.  Cardiac cath 01/28/12: Left Main: normal  Left anterior Descending: Proximal LAD has a 50% stenosis. The distal LAD has minor luminal irregularities. The 1st diagonal has a 30-40% stenosis at the take off.  Left Circumflex: Large vessel that trifurcates quickly into 3 branches. The 1st OM is severely diseased and is subtotally occluded. The  OM 2 is small and has a mid 80% stenosis. The continuation branch is small and provides collateral flow the the distal RCA. The OM1 is tighter than his cath in 2005.  Right Coronary Artery: large and dominant. There is a long stenosis in the proximal and mid segments that ranges from 60% stenosis in the prox region and is 95% stenosed in the mid region. There are mild - moderate diffuse in the distal region. The PDA is severely diseased in the mid segment. The distal PLSA is occluded and fills via Left to right collaterals. The entire RCA is basically unchanged from his previous cath in 2005.  LV Gram: Normal LV function. Inferior lateral basal akinesis.   Stress myoview 01/23/14:  Stress Procedure: The patient received IV Lexiscan 0.4 mg over 15-seconds with concurrent low level exercise and then Technetium 4761m Sestamibi was injected at 30-seconds while the patient continued walking one more minute. Quantitative spect images were obtained after a 45-minute delay.  Stress ECG: No significant change from baseline ECG  QPS  Raw Data Images: There is interference from nuclear activity from structures below the diaphragm. This does not affect the ability to read the study.  Stress Images: There is decreased uptake in the inferior wall.  Rest Images: There is decreased uptake in the inferior wall.  Subtraction (SDS): These findings are consistent with ischemia.  Transient Ischemic Dilatation (Normal <1.22): 1.33  Lung/Heart Ratio (Normal <0.45): 0.33  Quantitative Gated Spect Images  QGS EDV: 119 ml  QGS ESV: 62 ml    Impression  Exercise Capacity: Lexiscan with low level exercise.  BP Response: Normal blood pressure response.  Clinical Symptoms: No symptoms.  ECG Impression: No significant ECG changes with Lexiscan.  Comparison with Prior Nuclear Study: No images to compare  Overall Impression: Intermediate risk stress nuclear study with reversible inferior ischemia. There is underlying bowel, however, the SDS is 11. Extent 23%..  LV Ejection Fraction: 48%. LV Wall Motion: Mild inferior hypokinesis.  EKG: Sinus, PVCs, PACs, rate 80 bpm   Assessment and Plan:   1. CAD/Unstable angina: He is known to have diffuse CAD that has been stable for years. Recent chest pains occurring With exertion. Stress myoview with possible inferior ischemia. Will arrange cardiac cath 03/17/14. Continue ASA, statin, beta blocker, Ace-inh,Imdur.  Pre-cath labs next week. Risks and benefits reviewed.   2. HTN: BP controlled. No changes today  3. HLD: Continue statin. Lipids well controlled.

## 2014-03-07 NOTE — Patient Instructions (Signed)
Your physician recommends that you schedule a follow-up appointment in:  About 6 weeks with NP or PA.  Lab work was done today--CBC, BMP, PT  Your physician has requested that you have a cardiac catheterization. Cardiac catheterization is used to diagnose and/or treat various heart conditions. Doctors may recommend this procedure for a number of different reasons. The most common reason is to evaluate chest pain. Chest pain can be a symptom of coronary artery disease (CAD), and cardiac catheterization can show whether plaque is narrowing or blocking your heart's arteries. This procedure is also used to evaluate the valves, as well as measure the blood flow and oxygen levels in different parts of your heart. For further information please visit [email or web address]. Please follow instruction sheet, as given. Scheduled for March 17, 2014

## 2014-03-09 ENCOUNTER — Telehealth: Payer: Self-pay | Admitting: Cardiovascular Disease

## 2014-03-09 NOTE — Telephone Encounter (Signed)
Advised patient to reschedule shingle vaccination until after cath just in case he has any symptoms or gets sick from the shot.  Patient agreed that we do not want to do anything to possibly hinder getting cath done.  Instructed patient to call the office with any other questions or concerns.

## 2014-03-09 NOTE — Telephone Encounter (Signed)
New message    Patient has upcoming procedure on next Friday -heart cath . Wants to know can he take his shingle shot on Monday.

## 2014-03-10 ENCOUNTER — Encounter (HOSPITAL_COMMUNITY): Payer: Self-pay | Admitting: Pharmacy Technician

## 2014-03-17 ENCOUNTER — Encounter (HOSPITAL_COMMUNITY)
Admission: RE | Disposition: A | Discharge status: Home / Self Care | Payer: Self-pay | Source: Ambulatory Visit | Attending: Cardiovascular Disease

## 2014-03-17 ENCOUNTER — Ambulatory Visit (HOSPITAL_COMMUNITY)
Admission: RE | Admit: 2014-03-17 | Discharge: 2014-03-17 | Disposition: A | Discharge status: Home / Self Care | Payer: Medicare Other | Source: Ambulatory Visit | Attending: Cardiovascular Disease | Admitting: Cardiovascular Disease

## 2014-03-17 DIAGNOSIS — I25110 Atherosclerotic heart disease of native coronary artery with unstable angina pectoris: Secondary | ICD-10-CM | POA: Insufficient documentation

## 2014-03-17 DIAGNOSIS — Z9861 Coronary angioplasty status: Secondary | ICD-10-CM | POA: Insufficient documentation

## 2014-03-17 DIAGNOSIS — E785 Hyperlipidemia, unspecified: Secondary | ICD-10-CM | POA: Diagnosis not present

## 2014-03-17 DIAGNOSIS — K219 Gastro-esophageal reflux disease without esophagitis: Secondary | ICD-10-CM | POA: Insufficient documentation

## 2014-03-17 DIAGNOSIS — I252 Old myocardial infarction: Secondary | ICD-10-CM | POA: Diagnosis not present

## 2014-03-17 DIAGNOSIS — I2510 Atherosclerotic heart disease of native coronary artery without angina pectoris: Secondary | ICD-10-CM

## 2014-03-17 DIAGNOSIS — J45909 Unspecified asthma, uncomplicated: Secondary | ICD-10-CM | POA: Diagnosis not present

## 2014-03-17 DIAGNOSIS — R52 Pain, unspecified: Secondary | ICD-10-CM | POA: Diagnosis present

## 2014-03-17 DIAGNOSIS — I10 Essential (primary) hypertension: Secondary | ICD-10-CM | POA: Diagnosis not present

## 2014-03-17 DIAGNOSIS — I2582 Chronic total occlusion of coronary artery: Secondary | ICD-10-CM | POA: Insufficient documentation

## 2014-03-17 DIAGNOSIS — N200 Calculus of kidney: Secondary | ICD-10-CM | POA: Diagnosis not present

## 2014-03-17 DIAGNOSIS — E119 Type 2 diabetes mellitus without complications: Secondary | ICD-10-CM | POA: Diagnosis not present

## 2014-03-17 HISTORY — PX: LEFT HEART CATHETERIZATION WITH CORONARY ANGIOGRAM: SHX5451

## 2014-03-17 LAB — BASIC METABOLIC PANEL
ANION GAP: 13 (ref 5–15)
BUN: 23 mg/dL (ref 6–23)
CHLORIDE: 102 meq/L (ref 96–112)
CO2: 24 meq/L (ref 19–32)
Calcium: 9.8 mg/dL (ref 8.4–10.5)
Creatinine, Ser: 1.41 mg/dL — ABNORMAL HIGH (ref 0.50–1.35)
GFR calc Af Amer: 56 mL/min — ABNORMAL LOW (ref >90)
GFR calc non Af Amer: 48 mL/min — ABNORMAL LOW (ref >90)
Glucose, Bld: 119 mg/dL — ABNORMAL HIGH (ref 70–99)
Potassium: 5.2 mEq/L (ref 3.7–5.3)
SODIUM: 139 meq/L (ref 137–147)

## 2014-03-17 LAB — CK TOTAL AND CKMB (NOT AT ARMC)
CK, MB: 2.4 ng/mL (ref 0.3–4.0)
RELATIVE INDEX: INVALID (ref 0.0–2.5)
Total CK: 81 U/L (ref 7–232)

## 2014-03-17 LAB — GLUCOSE, CAPILLARY
Glucose-Capillary: 129 mg/dL — ABNORMAL HIGH (ref 70–99)
Glucose-Capillary: 115 mg/dL — ABNORMAL HIGH (ref 70–99)

## 2014-03-17 SURGERY — LEFT HEART CATHETERIZATION WITH CORONARY ANGIOGRAM
Anesthesia: LOCAL

## 2014-03-17 MED ORDER — ASPIRIN 81 MG PO CHEW
CHEWABLE_TABLET | ORAL | Status: AC
  Filled 2014-03-17: qty 1

## 2014-03-17 MED ORDER — ONDANSETRON HCL 4 MG/2ML IJ SOLN
INTRAMUSCULAR | Status: AC
  Filled 2014-03-17: qty 2

## 2014-03-17 MED ORDER — NITROGLYCERIN 1 MG/10 ML FOR IR/CATH LAB
INTRA_ARTERIAL | Status: AC
  Filled 2014-03-17: qty 10

## 2014-03-17 MED ORDER — SODIUM CHLORIDE 0.9 % IJ SOLN: 3.0000 mL | Freq: Two times a day (BID) | INTRAMUSCULAR | Status: DC

## 2014-03-17 MED ORDER — HEPARIN SODIUM (PORCINE) 1000 UNIT/ML IJ SOLN
INTRAMUSCULAR | Status: AC
  Filled 2014-03-17: qty 1

## 2014-03-17 MED ORDER — FENTANYL CITRATE 0.05 MG/ML IJ SOLN
INTRAMUSCULAR | Status: AC
  Filled 2014-03-17: qty 2

## 2014-03-17 MED ORDER — MIDAZOLAM HCL 2 MG/2ML IJ SOLN
INTRAMUSCULAR | Status: AC
  Filled 2014-03-17: qty 2

## 2014-03-17 MED ORDER — LIDOCAINE HCL (PF) 1 % IJ SOLN
INTRAMUSCULAR | Status: AC
  Filled 2014-03-17: qty 30

## 2014-03-17 MED ORDER — SODIUM CHLORIDE 0.9 % IJ SOLN: 3.0000 mL | INTRAMUSCULAR | Status: DC | PRN

## 2014-03-17 MED ORDER — SODIUM CHLORIDE 0.9 % IV SOLN: 250.0000 mL | INTRAVENOUS | Status: DC | PRN

## 2014-03-17 MED ORDER — SODIUM CHLORIDE 0.9 % IV SOLN: INTRAVENOUS | Status: AC

## 2014-03-17 MED ORDER — ASPIRIN 81 MG PO CHEW
81.0000 mg | CHEWABLE_TABLET | ORAL | Status: AC
  Administered 2014-03-17: 81 mg via ORAL

## 2014-03-17 MED ORDER — VERAPAMIL HCL 2.5 MG/ML IV SOLN
INTRAVENOUS | Status: AC
  Filled 2014-03-17: qty 2

## 2014-03-17 MED ORDER — HEPARIN (PORCINE) IN NACL 2-0.9 UNIT/ML-% IJ SOLN
INTRAMUSCULAR | Status: AC
  Filled 2014-03-17: qty 1000

## 2014-03-17 MED ORDER — ONDANSETRON HCL 4 MG/2ML IJ SOLN
4.0000 mg | Freq: Once | INTRAMUSCULAR | Status: AC
  Administered 2014-03-17: 4 mg via INTRAVENOUS

## 2014-03-17 MED ORDER — SODIUM CHLORIDE 0.9 % IV SOLN
INTRAVENOUS | Status: DC
  Administered 2014-03-17: 08:00:00 via INTRAVENOUS

## 2014-03-17 NOTE — Discharge Instructions (Signed)
Radial Site Care °Refer to this sheet in the next few weeks. These instructions provide you with information on caring for yourself after your procedure. Your caregiver may also give you more specific instructions. Your treatment has been planned according to current medical practices, but problems sometimes occur. Call your caregiver if you have any problems or questions after your procedure. °HOME CARE INSTRUCTIONS °· You may shower the day after the procedure. Remove the bandage (dressing) and gently wash the site with plain soap and water. Gently pat the site dry. °· Do not apply powder or lotion to the site. °· Do not submerge the affected site in water for 3 to 5 days. °· Inspect the site at least twice daily. °· Do not flex or bend the affected arm for 24 hours. °· No lifting over 5 pounds (2.3 kg) for 5 days after your procedure. °· Do not drive home if you are discharged the same day of the procedure. Have someone else drive you. °· You may drive 24 hours after the procedure unless otherwise instructed by your caregiver. °· Do not operate machinery or power tools for 24 hours. °· A responsible adult should be with you for the first 24 hours after you arrive home. °What to expect: °· Any bruising will usually fade within 1 to 2 weeks. °· Blood that collects in the tissue (hematoma) may be painful to the touch. It should usually decrease in size and tenderness within 1 to 2 weeks. °SEEK IMMEDIATE MEDICAL CARE IF: °· You have unusual pain at the radial site. °· You have redness, warmth, swelling, or pain at the radial site. °· You have drainage (other than a small amount of blood on the dressing). °· You have chills. °· You have a fever or persistent symptoms for more than 72 hours. °· You have a fever and your symptoms suddenly get worse. °· Your arm becomes pale, cool, tingly, or numb. °· You have heavy bleeding from the site. Hold pressure on the site. °Document Released: 06/28/2010 Document Revised:  08/18/2011 Document Reviewed: 06/28/2010 °ExitCare® Patient Information ©2015 [employer]. This information is not intended to replace advice given to you by your health care provider. Make sure you discuss any questions you have with your health care provider. ° °

## 2014-03-17 NOTE — Interval H&P Note (Signed)
History and Physical Interval Note:  03/17/2014 8:43 AM  [name]  has presented today for cardiac cath with the diagnosis of chest pain, known CAD. The various methods of treatment have been discussed with the patient and family. After consideration of risks, benefits and other options for treatment, the patient has consented to  Procedure(s): LEFT HEART CATHETERIZATION WITH CORONARY ANGIOGRAM (N/A) as a surgical intervention .  The patient's history has been reviewed, patient examined, no change in status, stable for surgery.  I have reviewed the patient's chart and labs.  Questions were answered to the patient's satisfaction.    Cath Lab Visit (complete for each Cath Lab visit)  Clinical Evaluation Leading to the Procedure:   ACS: No.  Non-ACS:    Anginal Classification: CCS II  Anti-ischemic medical therapy: Maximal Therapy (2 or more classes of medications)  Non-Invasive Test Results: Intermediate-risk stress test findings: cardiac mortality 1-3%/year  Prior CABG: No previous CABG        Richard Ho

## 2014-03-17 NOTE — CV Procedure (Signed)
Cardiac Catheterization Operative Report  [name] [id number] 10/9/20159:19 AM Richard BogaKWIATKOWSKI,[name], MD  Procedure Performed:  1. Left Heart Catheterization 2. Selective Coronary Angiography 3. Left ventricular angiogram  Operator: Verne Carrowhristopher McAlhany, MD  Arterial access site:  Right radial artery.   Indication:  73 yo male with history of CAD, HTN, HLD, DM. He has been followed by Dr. Shirlee LatchMcLean. He has a history of angioplasty in [place] and in [place] in the 1990s. Cardiac cath in 2005 showed severe, diffuse RCA system disease. He was admitted in August 2013 with exertional chest pain and dyspnea. He had a left heart cath showing significant disease in small OMs as well as severe diffuse RCA system disease, similar to the prior cath. He was managed medically as there were not good interventional targets in the RCA system. He was started on Imdur 30 mg daily. He had a recent stress test with possible inferior wall ischemia. Cath planned today to exclude progression of CAD.                                      Procedure Details: The risks, benefits, complications, treatment options, and expected outcomes were discussed with the patient. The patient and/or family concurred with the proposed plan, giving informed consent. The patient was brought to the cath lab after IV hydration was begun and oral premedication was given. The patient was further sedated with Versed and Fentanyl. The right wrist was assessed with a reverse Allens test which was positive. The right wrist was prepped and draped in a sterile fashion. 1% lidocaine was used for local anesthesia. Using the modified Seldinger access technique, a 5 French sheath was placed in the right radial artery. 3 mg Verapamil was given through the sheath. 5000 units IV heparin was given. Standard diagnostic catheters were used to perform selective coronary angiography. A pigtail catheter was used to perform a left ventricular  angiogram. The sheath was removed from the right radial artery and a Terumo hemostasis band was applied at the arteriotomy site on the right wrist.    There were no immediate complications. The patient was taken to the recovery area in stable condition.   Hemodynamic Findings: Central aortic pressure: 108/68 Left ventricular pressure: 101/11/17  Angiographic Findings:  Left Main: No obstructive disease.   Left anterior Descending:  Large caliber vessel that courses to the apex. The mid vessel has a 50% stenosis involving the takeoff of both diagonal branches. The distal LAD has minor luminal irregularities. The first diagonal is a moderate caliber vessel with ostial 30-40% stenosis at the ostium.   Left Circumflex: Large vessel that trifurcates quickly into 3 branches. The first OM is small in caliber and is sub-totally occluded proximally, unchanged from last cath. The second OM is small in caliber and has a mid 80% stenosis. The AV groove branch is small and provides collateral flow the the distal RCA.    Right Coronary Artery: Large dominant vessel with proximal 60% stenosis followed by 100% total occlusion in the mid vessel. There are small bridging collaterals that supply the mid and distal vessel. The distal vessel is also seen to fill from left to right collaterals.   Left Ventricular Angiogram: LVEF=45%.   Impression: 1. Severe double vessel CAD  2. Chronic total occlusion mid RCA with good collateral filling distally unchanged since last cath 3. Severe stenosis involving the trifurcation of  the Circumflex, all small caliber vessels, unchanged since last cath 4. Moderate stenosis mid LAD, unchanged since last cath 5. Mild LV systolic dysfunction  Recommendations: His stress test will likely always show inferior ischemia. There is no anterior ischemia on the myoview. The disease in his RCA and Circumflex is unchanged from last cath in 2013. Options I have discussed would include  CABG although I don't think his LAD is flow limiting. Could consider CTO PCI of the RCA but I am not sure this would give him any benefit at this time. His symptoms have improved with titration of medical therapy. For now, will continue medical therapy. Should he develop lifestyle limiting angina, would have to consider CABG vs complex PCI.        Complications:  None. The patient tolerated the procedure well.

## 2014-03-17 NOTE — H&P (View-Only) (Signed)
History of Present Illness: 73 yo male with history of CAD, HTN, HLD, DM here today for cardiac follow up. He has been followed by Dr. Shirlee LatchMcLean but was added onto my schedule by mistake 01/09/14 with c/o chest pains. He has a history of angioplasty in [place] and in [place] in the 1990s. Cardiac cath in 2005 showed severe, diffuse RCA system disease. He was admitted in August 2013 with exertional chest pain and dyspnea. He had a left heart cath showing significant disease in small OMs as well as severe diffuse RCA system disease, similar to the prior cath. He was managed medically as there were not good interventional targets in the RCA system. He was started on Imdur 30 mg daily. With recent exertional chest pain, I arranged a stress myoview 01/23/14 which showed possible inferior ischemia, LVEF=48%. This is in the distribution of his known severe RCA disease that has been medically for years. I increased his Imdur and discussed possible repeat cath if this did not help with his pain.   He is here today for follow up. Still having episodes of weakness and chest pressure with exertion, dyspnea with exertion.   Primary Care Physician: Amador CunasKwiatkowski  Last Lipid Profile:Lipid Panel     Component Value Date/Time   CHOL 151 01/26/2014 1134   TRIG 190.0* 01/26/2014 1134   HDL 43.80 01/26/2014 1134   CHOLHDL 3 01/26/2014 1134   VLDL 38.0 01/26/2014 1134   LDLCALC 69 01/26/2014 1134     Past Medical History  Diagnosis Date  . Asthma   . Coronary artery disease 1991    a) Angioplasty LAD 1991 [place] b) MI in 1995 at  [facility] in [place], med Rx  . GERD (gastroesophageal reflux disease)   . Hyperlipidemia   . Myocardial infarct   . Nephrolithiasis   . Osteopenia   . HTN (hypertension)   . Diabetes mellitus   . Hearing loss     Past Surgical History  Procedure Laterality Date  . Inguinal hernia repair    . Ptca    . Vasectomy    . Orif tibia & fibula fractures    . Angioplasty   1991  . Eye surgery  2010    cataract - right  . Cholecystectomy  2012  . Cardiac catheterization  2005    Moderate LCx and LAD disease and severe diffuse RCA disease  . Cardiac catheterization  01/2012    normal left main, 50% pLAD stenosis, dLAD mild luminal irregularities, D1 30-40% stenosis at take off; subtotally occluded/severely diseased OM1, mid 80% stenosis in small OM2, long prox and mid RCA stenoses ranging from 60-95%, severely diseased PDA, distal PLSA occluded filling via L-R collateral; normal LV function; inferior lateral basal akinesis.    Current Outpatient Prescriptions  Medication Sig Dispense Refill  . albuterol (PROVENTIL HFA;VENTOLIN HFA) 108 (90 BASE) MCG/ACT inhaler Inhale 2 puffs into the lungs every 4 (four) hours as needed. For shortness of breath      . atenolol (TENORMIN) 50 MG tablet Take 50 mg by mouth 2 (two) times daily.      . Cholecalciferol (VITAMIN D) 2000 UNITS CAPS Take 2,000 Units by mouth daily.       . fluticasone (FLONASE) 50 MCG/ACT nasal spray Place 2 sprays into both nostrils daily.  16 g  6  . gabapentin (NEURONTIN) 300 MG capsule Take 900 mg by mouth 3 (three) times daily. [facility] takes 300mg  qam and 900mg  qpm      .  Hyoscyamine Sulfate (HYOMAX-DT) 0.375 MG TBCR Take 1-2 tablets by mouth daily as needed. For irritable bowel syndrome      . isosorbide mononitrate (IMDUR) 30 MG 24 hr tablet Take 1 tablet (30 mg total) by mouth 2 (two) times daily.  180 tablet  3  . metFORMIN (GLUCOPHAGE) 500 MG tablet Take by mouth 2 (two) times daily with a meal.      . Multiple Vitamin (MULTIVITAMIN) tablet Take 1 tablet by mouth daily.        . nitroGLYCERIN (NITROSTAT) 0.4 MG SL tablet Place 1 tablet (0.4 mg total) under the tongue every 5 (five) minutes as needed. Chest pain  25 tablet  3  . nortriptyline (PAMELOR) 25 MG capsule TAKE ONE CAPSULE BY MOUTH AT BEDTIME  90 capsule  1  . pantoprazole (PROTONIX) 40 MG tablet Take 40 mg by mouth daily. Take one half  hour before eating.       Marland Kitchen PRECISION XTRA TEST STRIPS test strip USE ONE STRIP ONCE DAILY  100 each  3  . ramipril (ALTACE) 5 MG capsule TAKE ONE CAPSULE BY MOUTH ONCE DAILY  90 capsule  0  . simvastatin (ZOCOR) 80 MG tablet Take 40 mg by mouth at bedtime.       . tamsulosin (FLOMAX) 0.4 MG CAPS capsule Take 1 capsule (0.4 mg total) by mouth daily.  30 capsule  3  . levothyroxine (LEVOTHROID) 25 MCG tablet Take 1 tablet (25 mcg total) by mouth daily.  90 tablet  6   No current facility-administered medications for this visit.    Allergies  Allergen Reactions  . Meloxicam Other (See Comments)    Pt feels anxiety attacks when on this medicine    History   Social History  . Marital Status: Married    Spouse Name: N/A    Number of Children: N/A  . Years of Education: N/A   Occupational History  . Retired    Social History Main Topics  . Smoking status: Never Smoker   . Smokeless tobacco: Never Used  . Alcohol Use: No  . Drug Use: No  . Sexual Activity: Not on file   Other Topics Concern  . Not on file   Social History Narrative  . No narrative on file    Family History  Problem Relation Age of Onset  . Asthma Mother   . Allergies Sister   . Coronary artery disease Other     Review of Systems:  As stated in the HPI and otherwise negative.   BP 128/70  Pulse 50  Ht 5\' 11"  (1.803 m)  Wt 221 lb 6.4 oz (100.426 kg)  BMI 30.89 kg/m2  SpO2 97%  Physical Examination: General: Well developed, well nourished, NAD HEENT: OP clear, mucus membranes moist SKIN: warm, dry. No rashes. Neuro: No focal deficits Musculoskeletal: Muscle strength 5/5 all ext Psychiatric: Mood and affect normal Neck: No JVD, no carotid bruits, no thyromegaly, no lymphadenopathy. Lungs:Clear bilaterally, no wheezes, rhonci, crackles Cardiovascular: Regular rate and rhythm. No murmurs, gallops or rubs. Abdomen:Soft. Bowel sounds present. Non-tender.  Extremities: No lower extremity edema.  Pulses are 2 + in the bilateral DP/PT.  Cardiac cath 01/28/12: Left Main: normal  Left anterior Descending: Proximal LAD has a 50% stenosis. The distal LAD has minor luminal irregularities. The 1st diagonal has a 30-40% stenosis at the take off.  Left Circumflex: Large vessel that trifurcates quickly into 3 branches. The 1st OM is severely diseased and is subtotally occluded. The  OM 2 is small and has a mid 80% stenosis. The continuation branch is small and provides collateral flow the the distal RCA. The OM1 is tighter than his cath in 2005.  Right Coronary Artery: large and dominant. There is a long stenosis in the proximal and mid segments that ranges from 60% stenosis in the prox region and is 95% stenosed in the mid region. There are mild - moderate diffuse in the distal region. The PDA is severely diseased in the mid segment. The distal PLSA is occluded and fills via Left to right collaterals. The entire RCA is basically unchanged from his previous cath in 2005.  LV Gram: Normal LV function. Inferior lateral basal akinesis.   Stress myoview 01/23/14:  Stress Procedure: The patient received IV Lexiscan 0.4 mg over 15-seconds with concurrent low level exercise and then Technetium 4761m Sestamibi was injected at 30-seconds while the patient continued walking one more minute. Quantitative spect images were obtained after a 45-minute delay.  Stress ECG: No significant change from baseline ECG  QPS  Raw Data Images: There is interference from nuclear activity from structures below the diaphragm. This does not affect the ability to read the study.  Stress Images: There is decreased uptake in the inferior wall.  Rest Images: There is decreased uptake in the inferior wall.  Subtraction (SDS): These findings are consistent with ischemia.  Transient Ischemic Dilatation (Normal <1.22): 1.33  Lung/Heart Ratio (Normal <0.45): 0.33  Quantitative Gated Spect Images  QGS EDV: 119 ml  QGS ESV: 62 ml    Impression  Exercise Capacity: Lexiscan with low level exercise.  BP Response: Normal blood pressure response.  Clinical Symptoms: No symptoms.  ECG Impression: No significant ECG changes with Lexiscan.  Comparison with Prior Nuclear Study: No images to compare  Overall Impression: Intermediate risk stress nuclear study with reversible inferior ischemia. There is underlying bowel, however, the SDS is 11. Extent 23%..  LV Ejection Fraction: 48%. LV Wall Motion: Mild inferior hypokinesis.  EKG: Sinus, PVCs, PACs, rate 80 bpm   Assessment and Plan:   1. CAD/Unstable angina: He is known to have diffuse CAD that has been stable for years. Recent chest pains occurring With exertion. Stress myoview with possible inferior ischemia. Will arrange cardiac cath 03/17/14. Continue ASA, statin, beta blocker, Ace-inh,Imdur.  Pre-cath labs next week. Risks and benefits reviewed.   2. HTN: BP controlled. No changes today  3. HLD: Continue statin. Lipids well controlled.

## 2014-03-20 ENCOUNTER — Encounter: Payer: Self-pay | Admitting: Internal Medicine

## 2014-03-20 ENCOUNTER — Ambulatory Visit (INDEPENDENT_AMBULATORY_CARE_PROVIDER_SITE_OTHER): Payer: Medicare Other | Admitting: Internal Medicine

## 2014-03-20 VITALS — BP 132/80 | HR 44 | Temp 98.3°F | Resp 20 | Ht 71.0 in | Wt 227.0 lb

## 2014-03-20 DIAGNOSIS — E1342 Other specified diabetes mellitus with diabetic polyneuropathy: Secondary | ICD-10-CM

## 2014-03-20 DIAGNOSIS — E1142 Type 2 diabetes mellitus with diabetic polyneuropathy: Secondary | ICD-10-CM

## 2014-03-20 DIAGNOSIS — G629 Polyneuropathy, unspecified: Secondary | ICD-10-CM

## 2014-03-20 DIAGNOSIS — I10 Essential (primary) hypertension: Secondary | ICD-10-CM

## 2014-03-20 DIAGNOSIS — D539 Nutritional anemia, unspecified: Secondary | ICD-10-CM

## 2014-03-20 DIAGNOSIS — E119 Type 2 diabetes mellitus without complications: Secondary | ICD-10-CM

## 2014-03-20 LAB — VITAMIN B12: Vitamin B-12: 401 pg/mL (ref 211–911)

## 2014-03-20 MED ORDER — SILDENAFIL CITRATE 50 MG PO TABS: 50.0000 mg | ORAL_TABLET | Freq: Every day | ORAL | Status: DC | PRN

## 2014-03-20 MED ORDER — ISOSORBIDE MONONITRATE ER 60 MG PO TB24: 60.0000 mg | ORAL_TABLET | Freq: Every day | ORAL | Status: DC

## 2014-03-20 NOTE — Patient Instructions (Signed)
Limit your sodium (Salt) intake  You need to lose weight.  Consider a lower calorie diet and regular exercise.   Please check your hemoglobin A1c every 3 months  Change Imdur to 60 mg every morning and hold on the day of Viagra use

## 2014-03-20 NOTE — Progress Notes (Signed)
Subjective:    Patient ID: Richard Ho, male    DOB: 16-May-1941, 73 y.o.   MRN: [id number]  HPI  73 year old patient who is seen today in followup.  Last week.  He underwent a heart catheterization and has significant two-vessel disease.  Medically he remained stable. He has type 2 diabetes.  Last hemoglobin A1c is 7 point 0 He has long history of mild macrocytic anemia He has essential hypertension, which has been stable  Presently, he is being treated medically for his coronary artery disease, which includes Imdur on a twice a day regimen.  He is asking about Viagra use.  He is aware of nitrates Viagra risk of hypotension  Past Medical History  Diagnosis Date  . Asthma   . Coronary artery disease 1991    a) Angioplasty LAD 1991 [place] b) MI in 1995 at  [facility] in [place], med Rx  . GERD (gastroesophageal reflux disease)   . Hyperlipidemia   . Myocardial infarct   . Nephrolithiasis   . Osteopenia   . HTN (hypertension)   . Diabetes mellitus   . Hearing loss     History   Social History  . Marital Status: Married    Spouse Name: N/A    Number of Children: N/A  . Years of Education: N/A   Occupational History  . Retired    Social History Main Topics  . Smoking status: Never Smoker   . Smokeless tobacco: Never Used  . Alcohol Use: No  . Drug Use: No  . Sexual Activity: Not on file   Other Topics Concern  . Not on file   Social History Narrative  . No narrative on file    Past Surgical History  Procedure Laterality Date  . Inguinal hernia repair    . Ptca    . Vasectomy    . Orif tibia & fibula fractures    . Angioplasty  1991  . Eye surgery  2010    cataract - right  . Cholecystectomy  2012  . Cardiac catheterization  2005    Moderate LCx and LAD disease and severe diffuse RCA disease  . Cardiac catheterization  01/2012    normal left main, 50% pLAD stenosis, dLAD mild luminal irregularities, D1 30-40% stenosis at take off; subtotally  occluded/severely diseased OM1, mid 80% stenosis in small OM2, long prox and mid RCA stenoses ranging from 60-95%, severely diseased PDA, distal PLSA occluded filling via L-R collateral; normal LV function; inferior lateral basal akinesis.    Family History  Problem Relation Age of Onset  . Asthma Mother   . Allergies Sister   . Coronary artery disease Other     Allergies  Allergen Reactions  . Meloxicam Other (See Comments)    Pt feels anxiety attacks when on this medicine    Current Outpatient Prescriptions on File Prior to Visit  Medication Sig Dispense Refill  . albuterol (PROVENTIL HFA;VENTOLIN HFA) 108 (90 BASE) MCG/ACT inhaler Inhale 2 puffs into the lungs every 4 (four) hours as needed. For shortness of breath      . atenolol (TENORMIN) 50 MG tablet Take 50 mg by mouth 2 (two) times daily.      . Cholecalciferol (VITAMIN D) 2000 UNITS CAPS Take 2,000 Units by mouth daily.       . fluticasone (FLONASE) 50 MCG/ACT nasal spray Place 2 sprays into both nostrils daily as needed for allergies.      Marland Kitchen gabapentin (NEURONTIN) 300 MG capsule  Take 900 mg by mouth 2 (two) times daily. [facility] takes 300mg  qam and 900mg  qpm      . Hyoscyamine Sulfate (HYOMAX-DT) 0.375 MG TBCR Take 1-2 tablets by mouth daily as needed. For irritable bowel syndrome      . Multiple Vitamin (MULTIVITAMIN) tablet Take 1 tablet by mouth daily.        . nitroGLYCERIN (NITROSTAT) 0.4 MG SL tablet Place 1 tablet (0.4 mg total) under the tongue every 5 (five) minutes as needed. Chest pain  25 tablet  3  . nortriptyline (PAMELOR) 25 MG capsule Take 25 mg by mouth at bedtime.      . pantoprazole (PROTONIX) 40 MG tablet Take 40 mg by mouth daily. Take one half hour before eating.       Marland Kitchen. PRECISION XTRA TEST STRIPS test strip USE ONE STRIP ONCE DAILY  100 each  3  . ramipril (ALTACE) 5 MG capsule Take 5 mg by mouth daily.      . simvastatin (ZOCOR) 80 MG tablet Take 40 mg by mouth at bedtime.       . tamsulosin (FLOMAX)  0.4 MG CAPS capsule Take 1 capsule (0.4 mg total) by mouth daily.  30 capsule  3  . levothyroxine (SYNTHROID, LEVOTHROID) 25 MCG tablet Take 25 mcg by mouth daily.       No current facility-administered medications on file prior to visit.    BP 132/80  Pulse 44  Temp(Src) 98.3 F (36.8 C) (Oral)  Resp 20  Ht 5\' 11"  (1.803 m)  Wt 227 lb (102.967 kg)  BMI 31.67 kg/m2  SpO2 98%     Review of Systems  Constitutional: Negative for fever, chills, appetite change and fatigue.  HENT: Negative for congestion, dental problem, ear pain, hearing loss, sore throat, tinnitus, trouble swallowing and voice change.   Eyes: Negative for pain, discharge and visual disturbance.  Respiratory: Negative for cough, chest tightness, wheezing and stridor.   Cardiovascular: Negative for chest pain, palpitations and leg swelling.  Gastrointestinal: Negative for nausea, vomiting, abdominal pain, diarrhea, constipation, blood in stool and abdominal distention.  Genitourinary: Negative for urgency, hematuria, flank pain, discharge, difficulty urinating and genital sores.  Musculoskeletal: Negative for arthralgias, back pain, gait problem, joint swelling, myalgias and neck stiffness.  Skin: Negative for rash.  Neurological: Negative for dizziness, syncope, speech difficulty, weakness, numbness and headaches.  Hematological: Negative for adenopathy. Does not bruise/bleed easily.  Psychiatric/Behavioral: Negative for behavioral problems and dysphoric mood. The patient is not nervous/anxious.        Objective:   Physical Exam  Constitutional: He is oriented to person, place, and time. He appears well-developed.  HENT:  Head: Normocephalic.  Right Ear: External ear normal.  Left Ear: External ear normal.  Eyes: Conjunctivae and EOM are normal.  Neck: Normal range of motion.  Cardiovascular: Normal rate and normal heart sounds.   Right radial artery site with mild ecchymosis only.  Pulmonary/Chest: Breath  sounds normal.  Abdominal: Bowel sounds are normal.  Musculoskeletal: Normal range of motion. He exhibits no edema and no tenderness.  Neurological: He is alert and oriented to person, place, and time.  Psychiatric: He has a normal mood and affect. His behavior is normal.          Assessment & Plan:   Coronary artery disease ED.  Options discussed and they're only change to a every morning regimen.  He will hold Imdur on the day of Viagra use Hypertension Diabetes mellitus Macrocytic anemia.  We'll recheck a B12 level

## 2014-03-20 NOTE — Progress Notes (Signed)
Pre visit review using our clinic review tool, if applicable. No additional management support is needed unless otherwise documented below in the visit note. 

## 2014-03-22 ENCOUNTER — Encounter: Payer: Self-pay | Admitting: Internal Medicine

## 2014-04-14 ENCOUNTER — Encounter: Payer: Self-pay | Admitting: Cardiovascular Disease

## 2014-04-17 ENCOUNTER — Encounter: Payer: Self-pay | Admitting: Nurse Practitioner

## 2014-04-17 ENCOUNTER — Ambulatory Visit (INDEPENDENT_AMBULATORY_CARE_PROVIDER_SITE_OTHER): Payer: Medicare Other | Admitting: Nurse Practitioner

## 2014-04-17 VITALS — BP 130/72 | HR 56 | Ht 71.0 in | Wt 224.8 lb

## 2014-04-17 DIAGNOSIS — I10 Essential (primary) hypertension: Secondary | ICD-10-CM

## 2014-04-17 DIAGNOSIS — N529 Male erectile dysfunction, unspecified: Secondary | ICD-10-CM

## 2014-04-17 DIAGNOSIS — Z9889 Other specified postprocedural states: Secondary | ICD-10-CM

## 2014-04-17 DIAGNOSIS — I259 Chronic ischemic heart disease, unspecified: Secondary | ICD-10-CM

## 2014-04-17 DIAGNOSIS — E785 Hyperlipidemia, unspecified: Secondary | ICD-10-CM

## 2014-04-17 NOTE — Progress Notes (Signed)
Richard Ho Date of Birth: [birth year] Medical Record #[id number]  History of Present Illness: Richard Ho is seen back today for a post cath visit - seen for Dr. Clifton James (former patient of Dr. Shirlee Latch). He is a 73 year old male with CAD, HTN, and HLD. He has had prior PCI in [place] and in [place] back in the 1990s. Cath from 2005 showed severe diffuse RCA disease -  Medically managed. Repeat cath in August 2013 basically unchanged - continued to be medically managed. Imdur was started.  Saw Dr. Clifton James (placed on wrong schedule) back in August with chest pain - had abnormal Myoview with inferior ischemia- referred back for repeat cath. See below. Mild LV dysfunction noted.   Comes in today. Here alone. He is doing well. No chest pain. He is happy with how he is doing. He is trying to get in to a more routine exercise program but is limited by his peripheral neuropathy. He is tolerating his Imdur - notes that it was changed to 60 mg a day by PCP and he was given Viagra RX. He prefers to not use QD dosing of Imdur and would like to stay on his current regimen. He understands that he cannot take Viagra. He and his wife "have gotten creative". BP about 116 systolic at home - he does get a little dizzy if he gets up too quick.   Current Outpatient Prescriptions  Medication Sig Dispense Refill  . albuterol (PROVENTIL HFA;VENTOLIN HFA) 108 (90 BASE) MCG/ACT inhaler Inhale 2 puffs into the lungs every 4 (four) hours as needed. For shortness of breath    . atenolol (TENORMIN) 50 MG tablet Take 50 mg by mouth 2 (two) times daily.    . Cholecalciferol (VITAMIN D) 2000 UNITS CAPS Take 2,000 Units by mouth daily.     . fluticasone (FLONASE) 50 MCG/ACT nasal spray Place 2 sprays into both nostrils daily as needed for allergies.    Marland Kitchen gabapentin (NEURONTIN) 300 MG capsule Take 900 mg by mouth 2 (two) times daily. [facility] takes 300mg  qam and 900mg  qpm    . Hyoscyamine Sulfate (HYOMAX-DT) 0.375 MG TBCR Take 1-2  tablets by mouth daily as needed. For irritable bowel syndrome    . isosorbide mononitrate (IMDUR) 30 MG 24 hr tablet Take 30 mg by mouth 2 (two) times daily.     . metFORMIN (GLUCOPHAGE) 500 MG tablet Take 500 mg by mouth 2 (two) times daily with a meal.    . Multiple Vitamin (MULTIVITAMIN) tablet Take 1 tablet by mouth daily.      . nitroGLYCERIN (NITROSTAT) 0.4 MG SL tablet Place 1 tablet (0.4 mg total) under the tongue every 5 (five) minutes as needed. Chest pain 25 tablet 3  . nortriptyline (PAMELOR) 25 MG capsule Take 25 mg by mouth at bedtime.    . pantoprazole (PROTONIX) 40 MG tablet Take 40 mg by mouth daily. Take one half hour before eating.     Marland Kitchen PRECISION XTRA TEST STRIPS test strip USE ONE STRIP ONCE DAILY 100 each 3  . ramipril (ALTACE) 5 MG capsule Take 5 mg by mouth daily.    . simvastatin (ZOCOR) 80 MG tablet Take 40 mg by mouth at bedtime.     . tamsulosin (FLOMAX) 0.4 MG CAPS capsule Take 1 capsule (0.4 mg total) by mouth daily. 30 capsule 3  . isosorbide mononitrate (IMDUR) 60 MG 24 hr tablet Take 1 tablet (60 mg total) by mouth daily. 180 tablet 3  .  levothyroxine (SYNTHROID, LEVOTHROID) 25 MCG tablet Take 25 mcg by mouth daily.     No current facility-administered medications for this visit.    Allergies  Allergen Reactions  . Meloxicam Other (See Comments)    Pt feels anxiety attacks when on this medicine    Past Medical History  Diagnosis Date  . Asthma   . Coronary artery disease 1991    a) Angioplasty LAD 1991 [place] b) MI in 1995 at  [facility] in [place], med Rx  . GERD (gastroesophageal reflux disease)   . Hyperlipidemia   . Myocardial infarct   . Nephrolithiasis   . Osteopenia   . HTN (hypertension)   . Diabetes mellitus   . Hearing loss     Past Surgical History  Procedure Laterality Date  . Inguinal hernia repair    . Ptca    . Vasectomy    . Orif tibia & fibula fractures    . Angioplasty  1991  . Eye surgery  2010    cataract -  right  . Cholecystectomy  2012  . Cardiac catheterization  2005    Moderate LCx and LAD disease and severe diffuse RCA disease  . Cardiac catheterization  01/2012    normal left main, 50% pLAD stenosis, dLAD mild luminal irregularities, D1 30-40% stenosis at take off; subtotally occluded/severely diseased OM1, mid 80% stenosis in small OM2, long prox and mid RCA stenoses ranging from 60-95%, severely diseased PDA, distal PLSA occluded filling via L-R collateral; normal LV function; inferior lateral basal akinesis.    History  Smoking status  . Never Smoker   Smokeless tobacco  . Never Used    History  Alcohol Use No    Family History  Problem Relation Age of Onset  . Asthma Mother   . Allergies Sister   . Coronary artery disease Other     Review of Systems: The review of systems is per the HPI.  All other systems were reviewed and are negative.  Physical Exam: BP 130/72 mmHg  Pulse 56  Ht 5\' 11"  (1.803 m)  Wt 224 lb 12.8 oz (101.969 kg)  BMI 31.37 kg/m2 Patient is very pleasant and in no acute distress. Skin is warm and dry. Color is normal.  HEENT is unremarkable. Normocephalic/atraumatic. PERRL. Sclera are nonicteric. Neck is supple. No masses. No JVD. Lungs are clear. Cardiac exam shows a regular rate and rhythm. Abdomen is soft. Extremities are without edema. Gait and ROM are intact. No gross neurologic deficits noted. His right wrist is ok.    Wt Readings from Last 3 Encounters:  04/17/14 224 lb 12.8 oz (101.969 kg)  03/20/14 227 lb (102.967 kg)  03/17/14 220 lb (99.791 kg)    LABORATORY DATA/PROCEDURES:  Lab Results  Component Value Date   WBC 7.3 03/07/2014   HGB 11.8* 03/07/2014   HCT 35.9* 03/07/2014   PLT 263.0 03/07/2014   GLUCOSE 119* 03/17/2014   CHOL 151 01/26/2014   TRIG 190.0* 01/26/2014   HDL 43.80 01/26/2014   LDLCALC 69 01/26/2014   ALT 41 01/26/2014   AST 34 01/26/2014   NA 139 03/17/2014   K 5.2 03/17/2014   CL 102 03/17/2014    CREATININE 1.41* 03/17/2014   BUN 23 03/17/2014   CO2 24 03/17/2014   TSH 2.93 01/20/2014   PSA 1.82 03/06/2011   INR 1.0 03/07/2014   HGBA1C 7.0* 01/26/2014   MICROALBUR 1.4 02/21/2013    BNP (last 3 results) No results for input(s): PROBNP  in the last 8760 hours.  Cardiac Catheterization Operative Report  KOLBEE BOGUSZ [id number] 10/9/20159:19 AM Rogelia Boga, MD  Procedure Performed:  1. Left Heart Catheterization 2. Selective Coronary Angiography 3. Left ventricular angiogram  Operator: Verne Carrow, MD  Arterial access site: Right radial artery.   Indication: 73 yo male with history of CAD, HTN, HLD, DM. He has been followed by Dr. Shirlee Latch. He has a history of angioplasty in [place] and in [place] in the 1990s. Cardiac cath in 2005 showed severe, diffuse RCA system disease. He was admitted in August 2013 with exertional chest pain and dyspnea. He had a left heart cath showing significant disease in small OMs as well as severe diffuse RCA system disease, similar to the prior cath. He was managed medically as there were not good interventional targets in the RCA system. He was started on Imdur 30 mg daily. He had a recent stress test with possible inferior wall ischemia. Cath planned today to exclude progression of CAD.   Procedure Details: The risks, benefits, complications, treatment options, and expected outcomes were discussed with the patient. The patient and/or family concurred with the proposed plan, giving informed consent. The patient was brought to the cath lab after IV hydration was begun and oral premedication was given. The patient was further sedated with Versed and Fentanyl. The right wrist was assessed with a reverse Allens test which was positive. The right wrist was prepped and draped in a sterile fashion. 1% lidocaine was used for local anesthesia. Using the modified Seldinger access technique, a 5 French  sheath was placed in the right radial artery. 3 mg Verapamil was given through the sheath. 5000 units IV heparin was given. Standard diagnostic catheters were used to perform selective coronary angiography. A pigtail catheter was used to perform a left ventricular angiogram. The sheath was removed from the right radial artery and a Terumo hemostasis band was applied at the arteriotomy site on the right wrist.   There were no immediate complications. The patient was taken to the recovery area in stable condition.   Hemodynamic Findings: Central aortic pressure: 108/68 Left ventricular pressure: 101/11/17  Angiographic Findings:  Left Main: No obstructive disease.   Left anterior Descending: Large caliber vessel that courses to the apex. The mid vessel has a 50% stenosis involving the takeoff of both diagonal branches. The distal LAD has minor luminal irregularities. The first diagonal is a moderate caliber vessel with ostial 30-40% stenosis at the ostium.   Left Circumflex: Large vessel that trifurcates quickly into 3 branches. The first OM is small in caliber and is sub-totally occluded proximally, unchanged from last cath. The second OM is small in caliber and has a mid 80% stenosis. The AV groove branch is small and provides collateral flow the the distal RCA.   Right Coronary Artery: Large dominant vessel with proximal 60% stenosis followed by 100% total occlusion in the mid vessel. There are small bridging collaterals that supply the mid and distal vessel. The distal vessel is also seen to fill from left to right collaterals.   Left Ventricular Angiogram: LVEF=45%.   Impression: 1. Severe double vessel CAD  2. Chronic total occlusion mid RCA with good collateral filling distally unchanged since last cath 3. Severe stenosis involving the trifurcation of the Circumflex, all small caliber vessels, unchanged since last cath 4. Moderate stenosis mid LAD, unchanged since last cath 5. Mild  LV systolic dysfunction  Recommendations: His stress test will likely always show inferior ischemia. There is  no anterior ischemia on the myoview. The disease in his RCA and Circumflex is unchanged from last cath in 2013. Options I have discussed would include CABG although I don't think his LAD is flow limiting. Could consider CTO PCI of the RCA but I am not sure this would give him any benefit at this time. His symptoms have improved with titration of medical therapy. For now, will continue medical therapy. Should he develop lifestyle limiting angina, would have to consider CABG vs complex PCI.    Complications: None. The patient tolerated the procedure well.    Assessment / Plan: 1. Post cath - to continue with medical management - he is doing well clinically.   2. CAD -  Cath noted - mild LV dysfunction - will keep on his current regimen - he has continued Imdur 30 mg BID.   3. ED -  Not a candidate for Viagra, Levitra, Cialis - seeing urology.   4. HTN -  BP is good - lower at home - do not think he will tolerate increase in ACE  5. HLD -  On statin  6. Mild LV dysfunction - on low dose ACE and beta blocker - do not think he will tolerate med titration at this time. He is asymptomatic.   See back in January.   Patient is agreeable to this plan and will call if any problems develop in the interim.   Rosalio MacadamiaLori C. Adja Ruff, RN, ANP-C [facility] [address] [place], [place]  [id number] [phone]

## 2014-04-17 NOTE — Patient Instructions (Addendum)
Stay off the Viagra  Stay on your current medicines - ok to take the Imdur 30 mg twice a day  Regular exercise is encouraged  See Dr. Clifton JamesMcAlhany in January as planned  Call the [facility] office at [phone] if you have any questions, problems or concerns.

## 2014-04-21 ENCOUNTER — Other Ambulatory Visit: Payer: Self-pay | Admitting: *Deleted

## 2014-04-21 MED ORDER — GLUCOSE BLOOD VI STRP: ORAL_STRIP | Status: DC

## 2014-05-01 ENCOUNTER — Other Ambulatory Visit: Payer: Self-pay | Admitting: Internal Medicine

## 2014-05-08 ENCOUNTER — Ambulatory Visit: Payer: Medicare Other | Admitting: Internal Medicine

## 2014-05-15 LAB — HEMOGLOBIN A1C: HEMOGLOBIN A1C: 7 % — AB (ref 4.0–6.0)

## 2014-05-15 LAB — BASIC METABOLIC PANEL
BUN: 18 mg/dL (ref 4–21)
CREATININE: 1.4 mg/dL — AB (ref 0.6–1.3)
Glucose: 107 mg/dL
POTASSIUM: 5.1 mmol/L (ref 3.4–5.3)
Sodium: 139 mmol/L (ref 137–147)

## 2014-05-18 ENCOUNTER — Encounter (HOSPITAL_COMMUNITY): Payer: Self-pay | Admitting: Cardiovascular Disease

## 2014-05-19 ENCOUNTER — Telehealth: Payer: Self-pay | Admitting: *Deleted

## 2014-05-19 ENCOUNTER — Encounter: Payer: Self-pay | Admitting: Internal Medicine

## 2014-05-19 NOTE — Telephone Encounter (Signed)
Pt called back, told him I [facility] and Dr. Kirtland BouchardK is out of the office till Dec 21st and will review them then. Pt verbalized understanding and stated he wanted to know if okay to take Lisinopril blood pressure medicine that they are starting him on with his other medications. Told pt Lisinopril is a common blood pressure medication and okay to take with other medications. Pt verbalized understanding.

## 2014-05-19 NOTE — Telephone Encounter (Signed)
Left message on voicemail to call office.  

## 2014-06-19 ENCOUNTER — Ambulatory Visit (INDEPENDENT_AMBULATORY_CARE_PROVIDER_SITE_OTHER): Payer: Medicare Other | Admitting: Internal Medicine

## 2014-06-19 ENCOUNTER — Encounter: Payer: Self-pay | Admitting: Internal Medicine

## 2014-06-19 VITALS — BP 136/74 | HR 39 | Temp 97.4°F | Resp 20 | Ht 71.0 in | Wt 226.0 lb

## 2014-06-19 DIAGNOSIS — E1142 Type 2 diabetes mellitus with diabetic polyneuropathy: Secondary | ICD-10-CM

## 2014-06-19 DIAGNOSIS — I10 Essential (primary) hypertension: Secondary | ICD-10-CM

## 2014-06-19 DIAGNOSIS — N183 Chronic kidney disease, stage 3 unspecified: Secondary | ICD-10-CM

## 2014-06-19 DIAGNOSIS — E785 Hyperlipidemia, unspecified: Secondary | ICD-10-CM

## 2014-06-19 DIAGNOSIS — E119 Type 2 diabetes mellitus without complications: Secondary | ICD-10-CM

## 2014-06-19 DIAGNOSIS — E1342 Other specified diabetes mellitus with diabetic polyneuropathy: Secondary | ICD-10-CM

## 2014-06-19 DIAGNOSIS — G629 Polyneuropathy, unspecified: Secondary | ICD-10-CM

## 2014-06-19 NOTE — Progress Notes (Signed)
Subjective:    Patient ID: Richard Ho, male    DOB: 1941/01/31, 74 y.o.   MRN: [id number]  HPI  74 year old patient who is seen today for follow-up.  He is followed at the [facility] system and recently has been placed on lisinopril 5 mg daily.  He also has been on daily Altace. He has type 2 diabetes and chronic kidney disease.  Creatinine has been stable at 1.4.  He is on low-dose metformin therapy He has essential hypertension. Diabetes is also complicated by peripheral neuropathy. He is followed closely by cardiology   Past Medical History  Diagnosis Date  . Asthma   . Coronary artery disease 1991    a) Angioplasty LAD 1991 [place] b) MI in 1995 at  [facility] in [place], med Rx  . GERD (gastroesophageal reflux disease)   . Hyperlipidemia   . Myocardial infarct   . Nephrolithiasis   . Osteopenia   . HTN (hypertension)   . Diabetes mellitus   . Hearing loss     History   Social History  . Marital Status: Married    Spouse Name: N/A    Number of Children: N/A  . Years of Education: N/A   Occupational History  . Retired    Social History Main Topics  . Smoking status: Never Smoker   . Smokeless tobacco: Never Used  . Alcohol Use: No  . Drug Use: No  . Sexual Activity: Not on file   Other Topics Concern  . Not on file   Social History Narrative    Past Surgical History  Procedure Laterality Date  . Inguinal hernia repair    . Ptca    . Vasectomy    . Orif tibia & fibula fractures    . Angioplasty  1991  . Eye surgery  2010    cataract - right  . Cholecystectomy  2012  . Cardiac catheterization  2005    Moderate LCx and LAD disease and severe diffuse RCA disease  . Cardiac catheterization  01/2012    normal left main, 50% pLAD stenosis, dLAD mild luminal irregularities, D1 30-40% stenosis at take off; subtotally occluded/severely diseased OM1, mid 80% stenosis in small OM2, long prox and mid RCA stenoses ranging from 60-95%, severely  diseased PDA, distal PLSA occluded filling via L-R collateral; normal LV function; inferior lateral basal akinesis.  . Left heart catheterization with coronary angiogram N/A 01/28/2012    Procedure: LEFT HEART CATHETERIZATION WITH CORONARY ANGIOGRAM;  Surgeon: Iran Ouch, MD;  Location: [facility] CATH LAB;  Service: Cardiovascular;  Laterality: N/A;  . Left heart catheterization with coronary angiogram N/A 03/17/2014    Procedure: LEFT HEART CATHETERIZATION WITH CORONARY ANGIOGRAM;  Surgeon: Kathleene Hazel, MD;  [facility] C[facility] LAB;  Service: Cardiovascular;  Laterality: N/A;    Family History  Problem Relation Age of Onset  . Asthma Mother   . Allergies Sister   . Coronary artery disease Other     Allergies  Allergen Reactions  . Meloxicam Other (See Comments)    Pt feels anxiety attacks when on this medicine    Current Outpatient Prescriptions on File Prior to Visit  Medication Sig Dispense Refill  . albuterol (PROVENTIL HFA;VENTOLIN HFA) 108 (90 BASE) MCG/ACT inhaler Inhale 2 puffs into the lungs every 4 (four) hours as needed. For shortness of breath    . atenolol (TENORMIN) 50 MG tablet Take 50 mg by mouth 2 (two) times daily.    . Cholecalciferol (  VITAMIN D) 2000 UNITS CAPS Take 2,000 Units by mouth daily.     . fluticasone (FLONASE) 50 MCG/ACT nasal spray Place 2 sprays into both nostrils daily as needed for allergies.    Marland Kitchen. gabapentin (NEURONTIN) 300 MG capsule Take 900 mg by mouth 2 (two) times daily. [facility] takes 300mg  qam and 900mg  qpm    . glucose blood (PRECISION XTRA TEST STRIPS) test strip USE TO CHECK BLOOD SUGAR DAILY 100 each 3  . Hyoscyamine Sulfate (HYOMAX-DT) 0.375 MG TBCR Take 1-2 tablets by mouth daily as needed. For irritable bowel syndrome    . isosorbide mononitrate (IMDUR) 30 MG 24 hr tablet Take 30 mg by mouth 2 (two) times daily.     . metFORMIN (GLUCOPHAGE) 500 MG tablet Take 500 mg by mouth 2 (two) times daily with a meal.    . Multiple Vitamin  (MULTIVITAMIN) tablet Take 1 tablet by mouth daily.      . nitroGLYCERIN (NITROSTAT) 0.4 MG SL tablet Place 1 tablet (0.4 mg total) under the tongue every 5 (five) minutes as needed. Chest pain 25 tablet 3  . nortriptyline (PAMELOR) 25 MG capsule TAKE ONE CAPSULE BY MOUTH AT BEDTIME 90 capsule 0  . pantoprazole (PROTONIX) 40 MG tablet Take 40 mg by mouth daily. Take one half hour before eating.     . ramipril (ALTACE) 5 MG capsule TAKE ONE CAPSULE BY MOUTH ONCE DAILY 90 capsule 0  . simvastatin (ZOCOR) 80 MG tablet Take 40 mg by mouth at bedtime.     . tamsulosin (FLOMAX) 0.4 MG CAPS capsule Take 1 capsule (0.4 mg total) by mouth daily. 30 capsule 3  . levothyroxine (SYNTHROID, LEVOTHROID) 25 MCG tablet Take 25 mcg by mouth daily.     No current facility-administered medications on file prior to visit.    BP 136/74 mmHg  Pulse 39  Temp(Src) 97.4 F (36.3 C) (Oral)  Resp 20  Ht 5\' 11"  (1.803 m)  Wt 226 lb (102.513 kg)  BMI 31.53 kg/m2  SpO2 97%    Review of Systems  Constitutional: Negative for fever, chills, appetite change and fatigue.  HENT: Negative for congestion, dental problem, ear pain, hearing loss, sore throat, tinnitus, trouble swallowing and voice change.   Eyes: Negative for pain, discharge and visual disturbance.  Respiratory: Negative for cough, chest tightness, wheezing and stridor.   Cardiovascular: Negative for chest pain, palpitations and leg swelling.  Gastrointestinal: Negative for nausea, vomiting, abdominal pain, diarrhea, constipation, blood in stool and abdominal distention.  Genitourinary: Negative for urgency, hematuria, flank pain, discharge, difficulty urinating and genital sores.  Musculoskeletal: Negative for myalgias, back pain, joint swelling, arthralgias, gait problem and neck stiffness.  Skin: Negative for rash.  Neurological: Positive for numbness. Negative for dizziness, syncope, speech difficulty, weakness and headaches.  Hematological: Negative  for adenopathy. Does not bruise/bleed easily.  Psychiatric/Behavioral: Negative for behavioral problems and dysphoric mood. The patient is not nervous/anxious.        Objective:   Physical Exam  Constitutional: He is oriented to person, place, and time. He appears well-developed.  HENT:  Head: Normocephalic.  Right Ear: External ear normal.  Left Ear: External ear normal.  Eyes: Conjunctivae and EOM are normal.  Neck: Normal range of motion.  Cardiovascular: Normal rate, normal heart sounds and intact distal pulses.   Pulmonary/Chest: Breath sounds normal.  Abdominal: Bowel sounds are normal.  Musculoskeletal: Normal range of motion. He exhibits no edema or tenderness.  Neurological: He is alert and oriented to  person, place, and time.  Psychiatric: He has a normal mood and affect. His behavior is normal.          Assessment & Plan:   Diabetes mellitus.  Will continue low-dose metformin Chronic kidney disease, stable Essential hypertension.  Will continue Altace and discontinue lisinopril Diabetic peripheral neuropathy  Recheck 3 months

## 2014-06-19 NOTE — Patient Instructions (Addendum)
Please check your hemoglobin A1c every 3 months  Limit your sodium (Salt) intake  Potassium Content of Foods Potassium is a mineral found in many foods and drinks. It helps keep fluids and minerals balanced in your body and affects how steadily your heart beats. Potassium also helps control your blood pressure and keep your muscles and nervous system healthy. Certain health conditions and medicines may change the balance of potassium in your body. When this happens, you can help balance your level of potassium through the foods that you do or do not eat. Your health care provider or dietitian may recommend an amount of potassium that you should have each day. The following lists of foods provide the amount of potassium (in parentheses) per serving in each item. HIGH IN POTASSIUM  The following foods and beverages have 200 mg or more of potassium per serving:  Apricots, 2 raw or 5 dry (200 mg).  Artichoke, 1 medium (345 mg).  Avocado, raw,  each (245 mg).  Banana, 1 medium (425 mg).  Beans, lima, or baked beans, canned,  cup (280 mg).  Beans, white, canned,  cup (595 mg).  Beef roast, 3 oz (320 mg).  Beef, ground, 3 oz (270 mg).  Beets, raw or cooked,  cup (260 mg).  Bran muffin, 2 oz (300 mg).  Broccoli,  cup (230 mg).  Brussels sprouts,  cup (250 mg).  Cantaloupe,  cup (215 mg).  Cereal, 100% bran,  cup (200-400 mg).  Cheeseburger, single, fast food, 1 each (225-400 mg).  Chicken, 3 oz (220 mg).  Clams, canned, 3 oz (535 mg).  Crab, 3 oz (225 mg).  Dates, 5 each (270 mg).  Dried beans and peas,  cup (300-475 mg).  Figs, dried, 2 each (260 mg).  Fish: halibut, tuna, cod, snapper, 3 oz (480 mg).  Fish: salmon, haddock, swordfish, perch, 3 oz (300 mg).  Fish, tuna, canned 3 oz (200 mg).  Jamaica[place] fries, fast food, 3 oz (470 mg).  Granola with fruit and nuts,  cup (200 mg).  Grapefruit juice,  cup (200 mg).  Greens, beet,  cup (655  mg).  Honeydew melon,  cup (200 mg).  Kale, raw, 1 cup (300 mg).  Kiwi, 1 medium (240 mg).  Kohlrabi, rutabaga, parsnips,  cup (280 mg).  Lentils,  cup (365 mg).  Mango, 1 each (325 mg).  Milk, chocolate, 1 cup (420 mg).  Milk: nonfat, low-fat, whole, buttermilk, 1 cup (350-380 mg).  Molasses, 1 Tbsp (295 mg).  Mushrooms,  cup (280) mg.  Nectarine, 1 each (275 mg).  Nuts: almonds, peanuts, hazelnuts, [place], cashew, mixed, 1 oz (200 mg).  Nuts, pistachios, 1 oz (295 mg).  Orange, 1 each (240 mg).  Orange juice,  cup (235 mg).  Papaya, medium,  fruit (390 mg).  Peanut butter, chunky, 2 Tbsp (240 mg).  Peanut butter, smooth, 2 Tbsp (210 mg).  Pear, 1 medium (200 mg).  Pomegranate, 1 whole (400 mg).  Pomegranate juice,  cup (215 mg).  Pork, 3 oz (350 mg).  Potato chips, salted, 1 oz (465 mg).  Potato, baked with skin, 1 medium (925 mg).  Potatoes, boiled,  cup (255 mg).  Potatoes, mashed,  cup (330 mg).  Prune juice,  cup (370 mg).  Prunes, 5 each (305 mg).  Pudding, chocolate,  cup (230 mg).  Pumpkin, canned,  cup (250 mg).  Raisins, seedless,  cup (270 mg).  Seeds, sunflower or pumpkin, 1 oz (240 mg).  Soy milk, 1 cup (300  mg).  Spinach,  cup (420 mg).  Spinach, canned,  cup (370 mg).  Sweet potato, baked with skin, 1 medium (450 mg).  Swiss chard,  cup (480 mg).  Tomato or vegetable juice,  cup (275 mg).  Tomato sauce or puree,  cup (400-550 mg).  Tomato, raw, 1 medium (290 mg).  Tomatoes, canned,  cup (200-300 mg).  [place], 3 oz (250 mg).  Wheat germ, 1 oz (250 mg).  Winter squash,  cup (250 mg).  Yogurt, plain or fruited, 6 oz (260-435 mg).  Zucchini,  cup (220 mg). MODERATE IN POTASSIUM The following foods and beverages have 50-200 mg of potassium per serving:  Apple, 1 each (150 mg).  Apple juice,  cup (150 mg).  Applesauce,  cup (90 mg).  Apricot nectar,  cup (140 mg).  Asparagus,  small spears,  cup or 6 spears (155 mg).  Bagel, cinnamon raisin, 1 each (130 mg).  Bagel, egg or plain, 4 in., 1 each (70 mg).  Beans, green,  cup (90 mg).  Beans, yellow,  cup (190 mg).  Beer, regular, 12 oz (100 mg).  Beets, canned,  cup (125 mg).  Blackberries,  cup (115 mg).  Blueberries,  cup (60 mg).  Bread, whole wheat, 1 slice (70 mg).  Broccoli, raw,  cup (145 mg).  Cabbage,  cup (150 mg).  Carrots, cooked or raw,  cup (180 mg).  Cauliflower, raw,  cup (150 mg).  Celery, raw,  cup (155 mg).  Cereal, bran flakes, cup (120-150 mg).  Cheese, cottage,  cup (110 mg).  Cherries, 10 each (150 mg).  Chocolate, 1 oz bar (165 mg).  Coffee, brewed 6 oz (90 mg).  Corn,  cup or 1 ear (195 mg).  Cucumbers,  cup (80 mg).  Egg, large, 1 each (60 mg).  Eggplant,  cup (60 mg).  Endive, raw, cup (80 mg).  English muffin, 1 each (65 mg).  Fish, orange roughy, 3 oz (150 mg).  Frankfurter, beef or pork, 1 each (75 mg).  Fruit cocktail,  cup (115 mg).  Grape juice,  cup (170 mg).  Grapefruit,  fruit (175 mg).  Grapes,  cup (155 mg).  Greens: kale, turnip, collard,  cup (110-150 mg).  Ice cream or frozen yogurt, chocolate,  cup (175 mg).  Ice cream or frozen yogurt, vanilla,  cup (120-150 mg).  Lemons, limes, 1 each (80 mg).  Lettuce, all types, 1 cup (100 mg).  Mixed vegetables,  cup (150 mg).  Mushrooms, raw,  cup (110 mg).  Nuts: walnuts, pecans, or macadamia, 1 oz (125 mg).  Oatmeal,  cup (80 mg).  Okra,  cup (110 mg).  Onions, raw,  cup (120 mg).  Peach, 1 each (185 mg).  Peaches, canned,  cup (120 mg).  Pears, canned,  cup (120 mg).  Peas, green, frozen,  cup (90 mg).  Peppers, green,  cup (130 mg).  Peppers, red,  cup (160 mg).  Pineapple juice,  cup (165 mg).  Pineapple, fresh or canned,  cup (100 mg).  Plums, 1 each (105 mg).  Pudding, vanilla,  cup (150 mg).  Raspberries,  cup  (90 mg).  Rhubarb,  cup (115 mg).  Rice, wild,  cup (80 mg).  Shrimp, 3 oz (155 mg).  Spinach, raw, 1 cup (170 mg).  Strawberries,  cup (125 mg).  Summer squash  cup (175-200 mg).  Swiss chard, raw, 1 cup (135 mg).  Tangerines, 1 each (140 mg).  Tea, brewed, 6 oz (65 mg).  Turnips,  cup (140 mg).  Watermelon,  cup (85 mg).  Wine, red, table, 5 oz (180 mg).  Wine, white, table, 5 oz (100 mg). LOW IN POTASSIUM The following foods and beverages have less than 50 mg of potassium per serving.  Bread, white, 1 slice (30 mg).  Carbonated beverages, 12 oz (less than 5 mg).  Cheese, 1 oz (20-30 mg).  Cranberries,  cup (45 mg).  Cranberry juice cocktail,  cup (20 mg).  Fats and oils, 1 Tbsp (less than 5 mg).  Hummus, 1 Tbsp (32 mg).  Nectar: papaya, mango, or pear,  cup (35 mg).  Rice, white or brown,  cup (50 mg).  Spaghetti or macaroni,  cup cooked (30 mg).  Tortilla, flour or corn, 1 each (50 mg).  Waffle, 4 in., 1 each (50 mg).  Water chestnuts,  cup (40 mg). Document Released: 01/07/2005 Document Revised: 05/31/2013 Document Reviewed: 04/22/2013 [facility] Patient Information 2015 [place], [place]. This information is not intended to replace advice given to you by your health care provider. Make sure you discuss any questions you have with your health care provider.

## 2014-06-25 ENCOUNTER — Other Ambulatory Visit: Payer: Self-pay | Admitting: Internal Medicine

## 2014-07-03 ENCOUNTER — Ambulatory Visit: Payer: Medicare Other | Admitting: Cardiovascular Disease

## 2014-07-06 ENCOUNTER — Ambulatory Visit (INDEPENDENT_AMBULATORY_CARE_PROVIDER_SITE_OTHER): Payer: Medicare Other | Admitting: Cardiovascular Disease

## 2014-07-06 ENCOUNTER — Encounter: Payer: Self-pay | Admitting: Cardiovascular Disease

## 2014-07-06 VITALS — BP 140/80 | HR 81 | Ht 71.0 in | Wt 225.0 lb

## 2014-07-06 DIAGNOSIS — I493 Ventricular premature depolarization: Secondary | ICD-10-CM

## 2014-07-06 DIAGNOSIS — I10 Essential (primary) hypertension: Secondary | ICD-10-CM

## 2014-07-06 DIAGNOSIS — E785 Hyperlipidemia, unspecified: Secondary | ICD-10-CM

## 2014-07-06 DIAGNOSIS — I2510 Atherosclerotic heart disease of native coronary artery without angina pectoris: Secondary | ICD-10-CM

## 2014-07-06 MED ORDER — METOPROLOL TARTRATE 50 MG PO TABS: 50.0000 mg | ORAL_TABLET | Freq: Two times a day (BID) | ORAL | Status: DC

## 2014-07-06 NOTE — Patient Instructions (Signed)
Your physician wants you to follow-up in:  6 months.  You will receive a reminder letter in the mail two months in advance. If you don't receive a letter, please call our office to schedule the follow-up appointment.  Your physician has recommended you make the following change in your medication:  Stop Atenolol. Start Metoprolol tartrate 50 mg by mouth twice daily

## 2014-07-06 NOTE — Progress Notes (Signed)
History of Present Illness: 74 yo male with history of CAD, HTN, HLD, DM here today for cardiac follow up. He has been followed by Dr. Shirlee Latch but he was seen in my office recently due to a scheduling mistake 01/09/14 with c/o chest pains. He has a history of angioplasty in [place] and in [place] in the 1990s. Cardiac cath in 2005 showed severe, diffuse RCA system disease. He was admitted in August 2013 with exertional chest pain and dyspnea. He had a left heart cath showing significant disease in small OMs as well as severe diffuse RCA system disease, similar to the prior cath. He was managed medically as there were not good interventional targets in the RCA system. He was started on Imdur 30 mg daily. With recent exertional chest pain, I arranged a stress myoview 01/23/14 which showed possible inferior ischemia, LVEF=48%. This is in the distribution of his known severe RCA disease that has been medically for years. Cardiac cath 03/17/14 as below with stable moderate CAD.   He is here today for follow up. He is feeling well. No chest pain or SOB.    Primary Care Physician: Amador Cunas  Last Lipid Profile:Lipid Panel     Component Value Date/Time   CHOL 151 01/26/2014 1134   TRIG 190.0* 01/26/2014 1134   HDL 43.80 01/26/2014 1134   CHOLHDL 3 01/26/2014 1134   VLDL 38.0 01/26/2014 1134   LDLCALC 69 01/26/2014 1134     Past Medical History  Diagnosis Date  . Asthma   . Coronary artery disease 1991    a) Angioplasty LAD 1991 [place] b) MI in 1995 at  [facility] in [place], med Rx  . GERD (gastroesophageal reflux disease)   . Hyperlipidemia   . Myocardial infarct   . Nephrolithiasis   . Osteopenia   . HTN (hypertension)   . Diabetes mellitus   . Hearing loss     Past Surgical History  Procedure Laterality Date  . Inguinal hernia repair    . Ptca    . Vasectomy    . Orif tibia & fibula fractures    . Angioplasty  1991  . Eye surgery  2010    cataract - right  .  Cholecystectomy  2012  . Cardiac catheterization  2005    Moderate LCx and LAD disease and severe diffuse RCA disease  . Cardiac catheterization  01/2012    normal left main, 50% pLAD stenosis, dLAD mild luminal irregularities, D1 30-40% stenosis at take off; subtotally occluded/severely diseased OM1, mid 80% stenosis in small OM2, long prox and mid RCA stenoses ranging from 60-95%, severely diseased PDA, distal PLSA occluded filling via L-R collateral; normal LV function; inferior lateral basal akinesis.  . Left heart catheterization with coronary angiogram N/A 01/28/2012    Procedure: LEFT HEART CATHETERIZATION WITH CORONARY ANGIOGRAM;  Surgeon: Iran Ouch, MD;  Location: [facility] CATH LAB;  Service: Cardiovascular;  Laterality: N/A;  . Left heart catheterization with coronary angiogram N/A 03/17/2014    Procedure: LEFT HEART CATHETERIZATION WITH CORONARY ANGIOGRAM;  Surgeon: Kathleene Hazel, MD;  Location: [facility] CATH LAB;  Service: Cardiovascular;  Laterality: N/A;    Current Outpatient Prescriptions  Medication Sig Dispense Refill  . albuterol (PROVENTIL HFA;VENTOLIN HFA) 108 (90 BASE) MCG/ACT inhaler Inhale 2 puffs into the lungs every 4 (four) hours as needed. For shortness of breath    . atenolol (TENORMIN) 50 MG tablet Take 50 mg by mouth 2 (two) times daily.    Marland Kitchen  fluticasone (FLONASE) 50 MCG/ACT nasal spray Place 2 sprays into both nostrils daily as needed for allergies.    Marland Kitchen gabapentin (NEURONTIN) 300 MG capsule Take 900 mg by mouth 2 (two) times daily.     Marland Kitchen glucose blood (PRECISION XTRA TEST STRIPS) test strip USE TO CHECK BLOOD SUGAR DAILY 100 each 3  . Hyoscyamine Sulfate (HYOMAX-DT) 0.375 MG TBCR Take 1-2 tablets by mouth daily as needed. For irritable bowel syndrome    . isosorbide mononitrate (IMDUR) 30 MG 24 hr tablet Take 30 mg by mouth 2 (two) times daily.     . metFORMIN (GLUCOPHAGE) 500 MG tablet Take 500 mg by mouth 2 (two) times daily with a meal.    . Multiple Vitamin  (MULTIVITAMIN) tablet Take 1 tablet by mouth daily.      . nitroGLYCERIN (NITROSTAT) 0.4 MG SL tablet Place 1 tablet (0.4 mg total) under the tongue every 5 (five) minutes as needed. Chest pain 25 tablet 3  . nortriptyline (PAMELOR) 25 MG capsule TAKE ONE CAPSULE BY MOUTH AT BEDTIME 90 capsule 0  . pantoprazole (PROTONIX) 40 MG tablet Take 40 mg by mouth daily. Take one half hour before eating.     . ramipril (ALTACE) 5 MG capsule TAKE ONE CAPSULE BY MOUTH ONCE DAILY 90 capsule 0  . simvastatin (ZOCOR) 80 MG tablet Take 40 mg by mouth at bedtime.     . tamsulosin (FLOMAX) 0.4 MG CAPS capsule TAKE ONE CAPSULE BY MOUTH DAILY 30 capsule 5  . levothyroxine (SYNTHROID, LEVOTHROID) 25 MCG tablet Take 25 mcg by mouth daily.     No current facility-administered medications for this visit.    Allergies  Allergen Reactions  . Meloxicam Other (See Comments)    Pt feels anxiety attacks when on this medicine    History   Social History  . Marital Status: Married    Spouse Name: N/A    Number of Children: N/A  . Years of Education: N/A   Occupational History  . Retired    Social History Main Topics  . Smoking status: Never Smoker   . Smokeless tobacco: Never Used  . Alcohol Use: No  . Drug Use: No  . Sexual Activity: Not on file   Other Topics Concern  . Not on file   Social History Narrative    Family History  Problem Relation Age of Onset  . Asthma Mother   . Allergies Sister   . Coronary artery disease Other     Review of Systems:  As stated in the HPI and otherwise negative.   BP 140/80 mmHg  Pulse 81  Ht  (1.803 m)  Wt 225 lb (102.059 kg)  BMI 31.39 kg/m2  SpO2 97%  Physical Examination: General: Well developed, well nourished, NAD HEENT: OP clear, mucus membranes moist SKIN: warm, dry. No rashes. Neuro: No focal deficits Musculoskeletal: Muscle strength 5/5 all ext Psychiatric: Mood and affect normal Neck: No JVD, no carotid bruits, no thyromegaly, no  lymphadenopathy. Lungs:Clear bilaterally, no wheezes, rhonci, crackles Cardiovascular: Regular rate and rhythm. No murmurs, gallops or rubs. Abdomen:Soft. Bowel sounds present. Non-tender.  Extremities: No lower extremity edema. Pulses are 2 + in the bilateral DP/PT.  Cardiac cath 01/28/12: Left Main: normal  Left anterior Descending: Proximal LAD has a 50% stenosis. The distal LAD has minor luminal irregularities. The 1st diagonal has a 30-40% stenosis at the take off.  Left Circumflex: Large vessel that trifurcates quickly into 3 branches. The 1st OM is severely diseased  and is subtotally occluded. The OM 2 is small and has a mid 80% stenosis. The continuation branch is small and provides collateral flow the the distal RCA. The OM1 is tighter than his cath in 2005.  Right Coronary Artery: large and dominant. There is a long stenosis in the proximal and mid segments that ranges from 60% stenosis in the prox region and is 95% stenosed in the mid region. There are mild - moderate diffuse in the distal region. The PDA is severely diseased in the mid segment. The distal PLSA is occluded and fills via Left to right collaterals. The entire RCA is basically unchanged from his previous cath in 2005.  LV Gram: Normal LV function. Inferior lateral basal akinesis.   Stress myoview 01/23/14:  Stress Procedure: The patient received IV Lexiscan 0.4 mg over 15-seconds with concurrent low level exercise and then Technetium 5649m Sestamibi was injected at 30-seconds while the patient continued walking one more minute. Quantitative spect images were obtained after a 45-minute delay.  Stress ECG: No significant change from baseline ECG  QPS  Raw Data Images: There is interference from nuclear activity from structures below the diaphragm. This does not affect the ability to read the study.  Stress Images: There is decreased uptake in the inferior wall.  Rest Images: There is decreased uptake in the inferior wall.    Subtraction (SDS): These findings are consistent with ischemia.  Transient Ischemic Dilatation (Normal <1.22): 1.33  Lung/Heart Ratio (Normal <0.45): 0.33  Quantitative Gated Spect Images  QGS EDV: 119 ml  QGS ESV: 62 ml  Impression  Exercise Capacity: Lexiscan with low level exercise.  BP Response: Normal blood pressure response.  Clinical Symptoms: No symptoms.  ECG Impression: No significant ECG changes with Lexiscan.  Comparison with Prior Nuclear Study: No images to compare  Overall Impression: Intermediate risk stress nuclear study with reversible inferior ischemia. There is underlying bowel, however, the SDS is 11. Extent 23%..  LV Ejection Fraction: 48%. LV Wall Motion: Mild inferior hypokinesis.  Cardiac cath 03/17/14: Left Main: No obstructive disease.  Left anterior Descending: Large caliber vessel that courses to the apex. The mid vessel has a 50% stenosis involving the takeoff of both diagonal branches. The distal LAD has minor luminal irregularities. The first diagonal is a moderate caliber vessel with ostial 30-40% stenosis at the ostium.  Left Circumflex: Large vessel that trifurcates quickly into 3 branches. The first OM is small in caliber and is sub-totally occluded proximally, unchanged from last cath. The second OM is small in caliber and has a mid 80% stenosis. The AV groove branch is small and provides collateral flow the the distal RCA.  Right Coronary Artery: Large dominant vessel with proximal 60% stenosis followed by 100% total occlusion in the mid vessel. There are small bridging collaterals that supply the mid and distal vessel. The distal vessel is also seen to fill from left to right collaterals.  Left Ventricular Angiogram: LVEF=45%.   EKG: NSR with PVCs. Rate 74 bpm.   Assessment and Plan:   1. CAD: Stable by recent cath October 2015. Continue ASA, statin, beta blocker, Ace-inh,Imdur.    2. HTN: BP controlled. No changes today  3. HLD: Continue  statin. Lipids well controlled.   4. PVCs: Will change atenolol to Lopressor 50 mg po BID. He will follow HR and BP at home.

## 2014-08-05 ENCOUNTER — Other Ambulatory Visit: Payer: Self-pay | Admitting: Internal Medicine

## 2014-09-11 ENCOUNTER — Encounter: Payer: Self-pay | Admitting: Internal Medicine

## 2014-09-11 ENCOUNTER — Ambulatory Visit (INDEPENDENT_AMBULATORY_CARE_PROVIDER_SITE_OTHER): Payer: Medicare Other | Admitting: Internal Medicine

## 2014-09-11 VITALS — BP 142/80 | HR 86 | Temp 97.6°F | Resp 20 | Ht 71.0 in | Wt 224.0 lb

## 2014-09-11 DIAGNOSIS — I252 Old myocardial infarction: Secondary | ICD-10-CM | POA: Diagnosis not present

## 2014-09-11 DIAGNOSIS — G629 Polyneuropathy, unspecified: Secondary | ICD-10-CM

## 2014-09-11 DIAGNOSIS — E1342 Other specified diabetes mellitus with diabetic polyneuropathy: Secondary | ICD-10-CM | POA: Diagnosis not present

## 2014-09-11 DIAGNOSIS — E1142 Type 2 diabetes mellitus with diabetic polyneuropathy: Secondary | ICD-10-CM

## 2014-09-11 DIAGNOSIS — E785 Hyperlipidemia, unspecified: Secondary | ICD-10-CM

## 2014-09-11 DIAGNOSIS — K219 Gastro-esophageal reflux disease without esophagitis: Secondary | ICD-10-CM | POA: Diagnosis not present

## 2014-09-11 LAB — BASIC METABOLIC PANEL
BUN: 22 mg/dL (ref 6–23)
CO2: 27 mEq/L (ref 19–32)
Calcium: 10.1 mg/dL (ref 8.4–10.5)
Chloride: 100 mEq/L (ref 96–112)
Creatinine, Ser: 1.47 mg/dL (ref 0.40–1.50)
GFR: 49.82 mL/min — AB (ref >60.00)
Glucose, Bld: 102 mg/dL — ABNORMAL HIGH (ref 70–99)
POTASSIUM: 5 meq/L (ref 3.5–5.1)
Sodium: 134 mEq/L — ABNORMAL LOW (ref 135–145)

## 2014-09-11 LAB — MICROALBUMIN / CREATININE URINE RATIO
Creatinine,U: 98 mg/dL
Microalb Creat Ratio: 0.8 mg/g (ref 0.0–30.0)
Microalb, Ur: 0.8 mg/dL (ref 0.0–1.9)

## 2014-09-11 LAB — HEMOGLOBIN A1C: Hgb A1c MFr Bld: 7 % — ABNORMAL HIGH (ref 4.6–6.5)

## 2014-09-11 MED ORDER — ISOSORBIDE MONONITRATE ER 30 MG PO TB24: 30.0000 mg | ORAL_TABLET | Freq: Two times a day (BID) | ORAL | Status: DC

## 2014-09-11 NOTE — Progress Notes (Signed)
Pre visit review using our clinic review tool, if applicable. No additional management support is needed unless otherwise documented below in the visit note. 

## 2014-09-11 NOTE — Progress Notes (Signed)
Subjective:    Patient ID: Richard Ho, male    DOB: 09-12-1940, 74 y.o.   MRN: [id number]  HPI 74 year old patient who is seen today for follow-up.  He has a history of diabetes, crit of by peripheral neuropathy.  He is followed by cardiology for coronary artery disease.  This has been stable.  He has some concerns about blood pressure tending to run a bit high.  He has chronic kidney disease with a stable creatinine of 1.4. He has a list of minor concerns that were addressed He did have an eye examination in November of last year.  Past Medical History  Diagnosis Date  . Asthma   . Coronary artery disease 1991    a) Angioplasty LAD 1991 [place] b) MI in 1995 at  [facility] in [place], med Rx  . GERD (gastroesophageal reflux disease)   . Hyperlipidemia   . Myocardial infarct   . Nephrolithiasis   . Osteopenia   . HTN (hypertension)   . Diabetes mellitus   . Hearing loss     History   Social History  . Marital Status: Married    Spouse Name: N/A  . Number of Children: N/A  . Years of Education: N/A   Occupational History  . Retired    Social History Main Topics  . Smoking status: Never Smoker   . Smokeless tobacco: Never Used  . Alcohol Use: No  . Drug Use: No  . Sexual Activity: Not on file   Other Topics Concern  . Not on file   Social History Narrative    Past Surgical History  Procedure Laterality Date  . Inguinal hernia repair    . Ptca    . Vasectomy    . Orif tibia & fibula fractures    . Angioplasty  1991  . Eye surgery  2010    cataract - right  . Cholecystectomy  2012  . Cardiac catheterization  2005    Moderate LCx and LAD disease and severe diffuse RCA disease  . Cardiac catheterization  01/2012    normal left main, 50% pLAD stenosis, dLAD mild luminal irregularities, D1 30-40% stenosis at take off; subtotally occluded/severely diseased OM1, mid 80% stenosis in small OM2, long prox and mid RCA stenoses ranging from 60-95%,  severely diseased PDA, distal PLSA occluded filling via L-R collateral; normal LV function; inferior lateral basal akinesis.  . Left heart catheterization with coronary angiogram N/A 01/28/2012    Procedure: LEFT HEART CATHETERIZATION WITH CORONARY ANGIOGRAM;  Surgeon: Iran OuchMuhammad A Arida, MD;  Location: [facility] CATH LAB;  Service: Cardiovascular;  Laterality: N/A;  . Left heart catheterization with coronary angiogram N/A 03/17/2014    Procedure: LEFT HEART CATHETERIZATION WITH CORONARY ANGIOGRAM;  Surgeon: Kathleene Hazelhristopher D McAlhany, MD;  [facility];  Service: Cardiovascular;  Laterality: N/A;    Family History  Problem Relation Age of Onset  . Asthma Mother   . Allergies Sister   . Coronary artery disease Other     Allergies  Allergen Reactions  . Meloxicam Other (See Comments)    Pt feels anxiety attacks when on this medicine    Current Outpatient Prescriptions on File Prior to Visit  Medication Sig Dispense Refill  . albuterol (PROVENTIL HFA;VENTOLIN HFA) 108 (90 BASE) MCG/ACT inhaler Inhale 2 puffs into the lungs every 4 (four) hours as needed. For shortness of breath    . aspirin EC 81 MG tablet Take 81 mg by mouth daily.    .Marland Kitchen  fluticasone (FLONASE) 50 MCG/ACT nasal spray Place 2 sprays into both nostrils daily as needed for allergies.    Marland Kitchen gabapentin (NEURONTIN) 300 MG capsule Take 900 mg by mouth 2 (two) times daily.     Marland Kitchen glucose blood (PRECISION XTRA TEST STRIPS) test strip USE TO CHECK BLOOD SUGAR DAILY 100 each 3  . Hyoscyamine Sulfate (HYOMAX-DT) 0.375 MG TBCR Take 1-2 tablets by mouth daily as needed. For irritable bowel syndrome    . metFORMIN (GLUCOPHAGE) 500 MG tablet Take 500 mg by mouth 2 (two) times daily with a meal.    . metoprolol (LOPRESSOR) 50 MG tablet Take 1 tablet (50 mg total) by mouth 2 (two) times daily. 180 tablet 3  . Multiple Vitamin (MULTIVITAMIN) tablet Take 1 tablet by mouth daily.      . nitroGLYCERIN (NITROSTAT) 0.4 MG SL tablet Place 1 tablet (0.4 mg  total) under the tongue every 5 (five) minutes as needed. Chest pain 25 tablet 3  . nortriptyline (PAMELOR) 25 MG capsule TAKE ONE CAPSULE BY MOUTH AT BEDTIME 90 capsule 0  . pantoprazole (PROTONIX) 40 MG tablet Take 40 mg by mouth daily. Take one half hour before eating.     . ramipril (ALTACE) 5 MG capsule TAKE ONE CAPSULE BY MOUTH ONCE DAILY 90 capsule 1  . simvastatin (ZOCOR) 80 MG tablet Take 40 mg by mouth at bedtime.     . tamsulosin (FLOMAX) 0.4 MG CAPS capsule TAKE ONE CAPSULE BY MOUTH DAILY 30 capsule 5  . levothyroxine (SYNTHROID, LEVOTHROID) 25 MCG tablet Take 25 mcg by mouth daily.     No current facility-administered medications on file prior to visit.    BP 142/80 mmHg  Pulse 86  Temp(Src) 97.6 F (36.4 C) (Oral)  Resp 20  Ht  (1.803 m)  Wt 224 lb (101.606 kg)  BMI 31.26 kg/m2  SpO2 98%      Review of Systems  Constitutional: Negative for fever, chills, appetite change and fatigue.  HENT: Positive for ear pain and postnasal drip. Negative for congestion, dental problem, hearing loss, sore throat, tinnitus, trouble swallowing and voice change.   Eyes: Negative for pain, discharge and visual disturbance.  Respiratory: Negative for cough, chest tightness, wheezing and stridor.   Cardiovascular: Positive for leg swelling. Negative for chest pain and palpitations.  Gastrointestinal: Negative for nausea, vomiting, abdominal pain, diarrhea, constipation, blood in stool and abdominal distention.  Endocrine: Negative for polydipsia, polyphagia and polyuria.  Genitourinary: Negative for urgency, hematuria, flank pain, discharge, difficulty urinating and genital sores.  Musculoskeletal: Positive for arthralgias. Negative for myalgias, back pain, joint swelling, gait problem and neck stiffness.  Skin: Negative for rash.  Neurological: Positive for numbness. Negative for dizziness, syncope, speech difficulty, weakness and headaches.  Hematological: Negative for  adenopathy. Does not bruise/bleed easily.  Psychiatric/Behavioral: Negative for behavioral problems and dysphoric mood. The patient is not nervous/anxious.        Objective:   Physical Exam  Constitutional:  Blood pressure 140/80  Cardiovascular: Intact distal pulses.   Neurological:  Decreased vibratory sensation distal lower extremities          Assessment & Plan:   Diabetes mellitus, could've by peripheral neuropathy Coronary artery disease, stable Dyslipidemia.  Continue statin therapy Chronic kidney disease  Follow cardiology Recheck here 3 months Exercise, weight loss encouraged Home blood pressure monitoring.  Encouraged Low-salt diet recommended

## 2014-09-11 NOTE — Patient Instructions (Signed)
Limit your sodium (Salt) intake    It is important that you exercise regularly, at least 20 minutes 3 to 4 times per week.  If you develop chest pain or shortness of breath seek  medical attention.  You need to lose weight.  Consider a lower calorie diet and regular exercise.   Please check your hemoglobin A1c every 3 months   

## 2014-09-18 ENCOUNTER — Ambulatory Visit: Payer: Medicare Other | Admitting: Internal Medicine

## 2014-09-25 ENCOUNTER — Ambulatory Visit (INDEPENDENT_AMBULATORY_CARE_PROVIDER_SITE_OTHER): Payer: Medicare Other | Admitting: Family Medicine

## 2014-09-25 ENCOUNTER — Encounter: Payer: Self-pay | Admitting: Family Medicine

## 2014-09-25 VITALS — BP 127/73 | HR 87 | Temp 98.4°F | Ht 71.0 in | Wt 220.0 lb

## 2014-09-25 DIAGNOSIS — J4531 Mild persistent asthma with (acute) exacerbation: Secondary | ICD-10-CM

## 2014-09-25 MED ORDER — PREDNISONE 10 MG PO TABS: ORAL_TABLET | ORAL | Status: DC

## 2014-09-25 MED ORDER — HYDROCODONE-HOMATROPINE 5-1.5 MG/5ML PO SYRP: 5.0000 mL | ORAL_SOLUTION | ORAL | Status: DC | PRN

## 2014-09-25 NOTE — Progress Notes (Signed)
   Subjective:    Patient ID: Richard Ho, male    DOB: [birth year], 74 y.o.   MRN: [id number]  HPI Here for 3 days of chest tightness, dry cough, and wheezing. No chest pain or fever. He tends to have a flare of his asthma every spring. Using his inhaler several times a day.    Review of Systems  Constitutional: Negative.   HENT: Positive for congestion. Negative for postnasal drip and sinus pressure.   Eyes: Negative.   Respiratory: Positive for cough, chest tightness, shortness of breath and wheezing.   Cardiovascular: Negative.        Objective:   Physical Exam  Constitutional: He appears well-developed and well-nourished.  HENT:  Right Ear: External ear normal.  Left Ear: External ear normal.  Nose: Nose normal.  Mouth/Throat: Oropharynx is clear and moist.  Eyes: Conjunctivae are normal.  Cardiovascular: Normal rate, regular rhythm, normal heart sounds and intact distal pulses.   Pulmonary/Chest: Effort normal. No respiratory distress. He has no rales.  Soft scattered wheezes only   Lymphadenopathy:    He has no cervical adenopathy.          Assessment & Plan:  Given a prednisone taper. Recheck prn

## 2014-09-25 NOTE — Progress Notes (Signed)
Pre visit review using our clinic review tool, if applicable. No additional management support is needed unless otherwise documented below in the visit note. 

## 2014-10-18 ENCOUNTER — Ambulatory Visit (INDEPENDENT_AMBULATORY_CARE_PROVIDER_SITE_OTHER): Payer: Medicare Other | Admitting: Internal Medicine

## 2014-10-18 ENCOUNTER — Encounter: Payer: Self-pay | Admitting: Internal Medicine

## 2014-10-18 VITALS — BP 112/72 | HR 90 | Temp 98.3°F | Resp 20 | Ht 71.0 in | Wt 216.0 lb

## 2014-10-18 DIAGNOSIS — I10 Essential (primary) hypertension: Secondary | ICD-10-CM | POA: Diagnosis not present

## 2014-10-18 DIAGNOSIS — E119 Type 2 diabetes mellitus without complications: Secondary | ICD-10-CM

## 2014-10-18 MED ORDER — HYOSCYAMINE SULFATE 0.125 MG SL SUBL: 0.1250 mg | SUBLINGUAL_TABLET | SUBLINGUAL | Status: DC | PRN

## 2014-10-18 NOTE — Progress Notes (Signed)
Subjective:    Patient ID: Richard Ho, male    DOB: 1941/03/21, 74 y.o.   MRN: [id number]  HPI 74 year old patient who presents today complaining of a flare of his IBS.  He has had periodic crampy lower abdominal discomfort.  No real nausea, vomiting or change in his bowel habits.  He also feels his blood sugar has been a bit higher for the past 2 or 3 weeks.  He states blood sugars range from 1:15 to 125 fasting.  He has used anti-spasmodics in the past with benefit.  His wife apparently also has IBS and he has benefited from her medication.  Past Medical History  Diagnosis Date  . Asthma   . Coronary artery disease 1991    a) Angioplasty LAD 1991 [place] b) MI in 1995 at  [facility] in [place], med Rx  . GERD (gastroesophageal reflux disease)   . Hyperlipidemia   . Myocardial infarct   . Nephrolithiasis   . Osteopenia   . HTN (hypertension)   . Diabetes mellitus   . Hearing loss     History   Social History  . Marital Status: Married    Spouse Name: N/A  . Number of Children: N/A  . Years of Education: N/A   Occupational History  . Retired    Social History Main Topics  . Smoking status: Never Smoker   . Smokeless tobacco: Never Used  . Alcohol Use: 0.0 oz/week    0 Standard drinks or equivalent per week     Comment: occ, once every 2 months  . Drug Use: No  . Sexual Activity: Not on file   Other Topics Concern  . Not on file   Social History Narrative    Past Surgical History  Procedure Laterality Date  . Inguinal hernia repair    . Ptca    . Vasectomy    . Orif tibia & fibula fractures    . Angioplasty  1991  . Eye surgery  2010    cataract - right  . Cholecystectomy  2012  . Cardiac catheterization  2005    Moderate LCx and LAD disease and severe diffuse RCA disease  . Cardiac catheterization  01/2012    normal left main, 50% pLAD stenosis, dLAD mild luminal irregularities, D1 30-40% stenosis at take off; subtotally occluded/severely  diseased OM1, mid 80% stenosis in small OM2, long prox and mid RCA stenoses ranging from 60-95%, severely diseased PDA, distal PLSA occluded filling via L-R collateral; normal LV function; inferior lateral basal akinesis.  . Left heart catheterization with coronary angiogram N/A 01/28/2012    Procedure: LEFT HEART CATHETERIZATION WITH CORONARY ANGIOGRAM;  Surgeon: Iran OuchMuhammad A Arida, MD;  Location: [facility] CATH LAB;  Service: Cardiovascular;  Laterality: N/A;  . Left heart catheterization with coronary angiogram N/A 03/17/2014    Procedure: LEFT HEART CATHETERIZATION WITH CORONARY ANGIOGRAM;  Surgeon: Kathleene Hazelhristopher D McAlhany, MD;  [facility];  Service: Cardiovascular;  Laterality: N/A;    Family History  Problem Relation Age of Onset  . Asthma Mother   . Allergies Sister   . Coronary artery disease Other     Allergies  Allergen Reactions  . Meloxicam Other (See Comments)    Pt feels anxiety attacks when on this medicine    Current Outpatient Prescriptions on File Prior to Visit  Medication Sig Dispense Refill  . albuterol (PROVENTIL HFA;VENTOLIN HFA) 108 (90 BASE) MCG/ACT inhaler Inhale 2 puffs into the lungs every 4 (four) hours  as needed. For shortness of breath    . aspirin EC 81 MG tablet Take 81 mg by mouth daily.    . fluticasone (FLONASE) 50 MCG/ACT nasal spray Place 2 sprays into both nostrils daily as needed for allergies.    Marland Kitchen. gabapentin (NEURONTIN) 300 MG capsule Take 900 mg by mouth 2 (two) times daily.     Marland Kitchen. glucose blood (PRECISION XTRA TEST STRIPS) test strip USE TO CHECK BLOOD SUGAR DAILY 100 each 3  . HYDROcodone-homatropine (HYDROMET) 5-1.5 MG/5ML syrup Take 5 mLs by mouth every 4 (four) hours as needed. 240 mL 0  . isosorbide mononitrate (IMDUR) 30 MG 24 hr tablet Take 1 tablet (30 mg total) by mouth 2 (two) times daily. 180 tablet 1  . metFORMIN (GLUCOPHAGE) 500 MG tablet Take 500 mg by mouth 2 (two) times daily with a meal.    . metoprolol (LOPRESSOR) 50 MG tablet  Take 1 tablet (50 mg total) by mouth 2 (two) times daily. 180 tablet 3  . Multiple Vitamin (MULTIVITAMIN) tablet Take 1 tablet by mouth daily.      . nitroGLYCERIN (NITROSTAT) 0.4 MG SL tablet Place 1 tablet (0.4 mg total) under the tongue every 5 (five) minutes as needed. Chest pain 25 tablet 3  . nortriptyline (PAMELOR) 25 MG capsule TAKE ONE CAPSULE BY MOUTH AT BEDTIME 90 capsule 0  . pantoprazole (PROTONIX) 40 MG tablet Take 40 mg by mouth daily. Take one half hour before eating.     . ramipril (ALTACE) 5 MG capsule TAKE ONE CAPSULE BY MOUTH ONCE DAILY 90 capsule 1  . simvastatin (ZOCOR) 80 MG tablet Take 40 mg by mouth at bedtime. Rx from [facility]    . tamsulosin (FLOMAX) 0.4 MG CAPS capsule TAKE ONE CAPSULE BY MOUTH DAILY 30 capsule 5  . levothyroxine (SYNTHROID, LEVOTHROID) 25 MCG tablet Take 25 mcg by mouth daily. Pt [facility]     No current facility-administered medications on file prior to visit.    BP 112/72 mmHg  Pulse 90  Temp(Src) 98.3 F (36.8 C) (Oral)  Resp 20  Ht 5\' 11"  (1.803 m)  Wt 216 lb (97.977 kg)  BMI 30.14 kg/m2  SpO2 98%      Review of Systems  Constitutional: Negative for fever, chills, appetite change and fatigue.  HENT: Negative for congestion, dental problem, ear pain, hearing loss, sore throat, tinnitus, trouble swallowing and voice change.   Eyes: Negative for pain, discharge and visual disturbance.  Respiratory: Negative for cough, chest tightness, wheezing and stridor.   Cardiovascular: Negative for chest pain, palpitations and leg swelling.  Gastrointestinal: Positive for abdominal pain. Negative for nausea, vomiting, diarrhea, constipation, blood in stool and abdominal distention.  Genitourinary: Negative for urgency, hematuria, flank pain, discharge, difficulty urinating and genital sores.  Musculoskeletal: Negative for myalgias, back pain, joint swelling, arthralgias, gait problem and neck stiffness.  Skin: Negative for rash.    Neurological: Negative for dizziness, syncope, speech difficulty, weakness, numbness and headaches.  Hematological: Negative for adenopathy. Does not bruise/bleed easily.  Psychiatric/Behavioral: Negative for behavioral problems and dysphoric mood. The patient is not nervous/anxious.        Objective:   Physical Exam  Constitutional: He is oriented to person, place, and time. He appears well-developed.  HENT:  Head: Normocephalic.  Right Ear: External ear normal.  Left Ear: External ear normal.  Eyes: Conjunctivae and EOM are normal.  Neck: Normal range of motion.  Cardiovascular: Normal rate and normal heart sounds.  Pulmonary/Chest: Breath sounds normal.  Abdominal: Soft. Bowel sounds are normal. He exhibits no distension and no mass. There is no tenderness. There is no rebound and no guarding.  Musculoskeletal: Normal range of motion. He exhibits no edema or tenderness.  Neurological: He is alert and oriented to person, place, and time.  Psychiatric: He has a normal mood and affect. His behavior is normal.          Assessment & Plan:   Episodic crampy abdominal pain/history of IBS.  Will treat with short-term anti-spasmodics and encourage a high-fiber diet.  We'll clinically observe at this time.  Will report any new or worsening symptoms Diabetes.  No change in therapy  Recheck for follow-up as scheduled

## 2014-10-18 NOTE — Progress Notes (Signed)
Pre visit review using our clinic review tool, if applicable. No additional management support is needed unless otherwise documented below in the visit note. 

## 2014-10-18 NOTE — Patient Instructions (Addendum)
Diet and Irritable Bowel Syndrome  No cure has been found for irritable bowel syndrome (IBS). Many options are available to treat the symptoms. Your caregiver will give you the best treatments available for your symptoms. He or she will also encourage you to manage stress and to make changes to your diet. You need to work with your caregiver and Registered Dietician to find the best combination of medicine, diet, counseling, and support to control your symptoms. The following are some diet suggestions. FOODS THAT MAKE IBS WORSE  Fatty foods, such as [place] fries.  Milk products, such as cheese or ice cream.  Chocolate.  Alcohol.  Caffeine (found in coffee and some sodas).  Carbonated drinks, such as soda. If certain foods cause symptoms, you should eat less of them or stop eating them. FOOD JOURNAL   Keep a journal of the foods that seem to cause distress. Write down:  What you are eating during the day and when.  What problems you are having after eating.  When the symptoms occur in relation to your meals.  What foods always make you feel badly.  Take your notes with you to your caregiver to see if you should stop eating certain foods. FOODS THAT MAKE IBS BETTER Fiber reduces IBS symptoms, especially constipation, because it makes stools soft, bulky, and easier to pass. Fiber is found in bran, bread, cereal, beans, fruit, and vegetables. Examples of foods with fiber include:  Apples.  Peaches.  Pears.  Berries.  Figs.  Broccoli, raw.  Cabbage.  Carrots.  Raw peas.  Kidney beans.  Lima beans.  Whole-grain bread.  Whole-grain cereal. Add foods with fiber to your diet a little at a time. This will let your body get used to them. Too much fiber at once might cause gas and swelling of your abdomen. This can trigger symptoms in a person with IBS. Caregivers usually recommend a diet with enough fiber to produce soft, painless bowel movements. High fiber diets may  cause gas and bloating. However, these symptoms often go away within a few weeks, as your body adjusts. In many cases, dietary fiber may lessen IBS symptoms, particularly constipation. However, it may not help pain or diarrhea. High fiber diets keep the colon mildly enlarged (distended) with the added fiber. This may help prevent spasms in the colon. Some forms of fiber also keep water in the stool, thereby preventing hard stools that are difficult to pass.  Besides telling you to eat more foods with fiber, your caregiver may also tell you to get more fiber by taking a fiber pill or drinking water mixed with a special high fiber powder. An example of this is a natural fiber laxative containing psyllium seed.  TIPS  Large meals can cause cramping and diarrhea in people with IBS. If this happens to you, try eating 4 or 5 small meals a day, or try eating less at each of your usual 3 meals. It may also help if your meals are low in fat and high in carbohydrates. Examples of carbohydrates are pasta, rice, whole-grain breads and cereals, fruits, and vegetables.  If dairy products cause your symptoms to flare up, you can try eating less of those foods. You might be able to handle yogurt better than other dairy products, because it contains bacteria that helps with digestion. Dairy products are an important source of calcium and other nutrients. If you need to avoid dairy products, be sure to talk with a Registered Dietitian about getting these nutrients  through other food sources.  Drink enough water and fluids to keep your urine clear or pale yellow. This is important, especially if you have diarrhea. FOR MORE INFORMATION  International Foundation for Functional Gastrointestinal Disorders: www.iffgd.org  National Digestive Diseases Information Clearinghouse: digestive.StageSync.siniddk.nih.gov Document Released: 08/16/2003 Document Revised: 08/18/2011 Document Reviewed: 08/26/2013 [facility] Patient Information 2015  [place] [place]. This information is not intended to replace advice given to you by your health care provider. Make sure you discuss any questions you have with your health care provider.  Report any new or worsening symptoms

## 2014-10-19 ENCOUNTER — Encounter: Payer: Self-pay | Admitting: Internal Medicine

## 2014-11-27 ENCOUNTER — Ambulatory Visit (INDEPENDENT_AMBULATORY_CARE_PROVIDER_SITE_OTHER): Payer: Medicare Other | Admitting: Internal Medicine

## 2014-11-27 ENCOUNTER — Other Ambulatory Visit: Payer: Self-pay | Admitting: *Deleted

## 2014-11-27 ENCOUNTER — Encounter: Payer: Self-pay | Admitting: Internal Medicine

## 2014-11-27 VITALS — BP 124/64 | HR 96 | Temp 98.5°F | Resp 20 | Ht 71.0 in | Wt 224.0 lb

## 2014-11-27 DIAGNOSIS — J4531 Mild persistent asthma with (acute) exacerbation: Secondary | ICD-10-CM

## 2014-11-27 DIAGNOSIS — I10 Essential (primary) hypertension: Secondary | ICD-10-CM

## 2014-11-27 DIAGNOSIS — E119 Type 2 diabetes mellitus without complications: Secondary | ICD-10-CM

## 2014-11-27 MED ORDER — HYDROCODONE-HOMATROPINE 5-1.5 MG/5ML PO SYRP: 5.0000 mL | ORAL_SOLUTION | Freq: Four times a day (QID) | ORAL | Status: DC | PRN

## 2014-11-27 MED ORDER — HYOSCYAMINE SULFATE 0.125 MG SL SUBL: 0.1250 mg | SUBLINGUAL_TABLET | SUBLINGUAL | Status: DC | PRN

## 2014-11-27 MED ORDER — PREDNISONE 10 MG PO TABS: 10.0000 mg | ORAL_TABLET | Freq: Two times a day (BID) | ORAL | Status: DC

## 2014-11-27 MED ORDER — FLUTICASONE PROPIONATE 50 MCG/ACT NA SUSP: 2.0000 | Freq: Every day | NASAL | Status: DC | PRN

## 2014-11-27 NOTE — Patient Instructions (Signed)
Acute bronchitis symptoms are generally not helped by antibiotics.  Take over-the-counter expectorants and cough medications such as  Mucinex DM.  Call if there is no improvement in 5 to 7 days or if  you develop worsening cough, fever, or new symptoms, such as shortness of breath or chest pain.  HOME CARE INSTRUCTIONS  Get plenty of rest.  Drink enough fluids to keep your urine clear or pale yellow (unless you have a medical condition that requires fluid restriction). Increasing fluids may help thin your respiratory secretions (sputum) and reduce chest congestion, and it will prevent dehydration.  Take medicines only as directed by your health care provider.  If you were prescribed an antibiotic medicine, finish it all even if you start to feel better.  Avoid smoking and secondhand smoke. Exposure to cigarette smoke or irritating chemicals will make bronchitis worse. If you are a smoker, consider using nicotine gum or skin patches to help control withdrawal symptoms. Quitting smoking will help your lungs heal faster.  Reduce the chances of another bout of acute bronchitis by washing your hands frequently, avoiding people with cold symptoms, and trying not to touch your hands to your mouth, nose, or eyes.   

## 2014-11-27 NOTE — Progress Notes (Signed)
Subjective:    Patient ID: Richard Ho, male    DOB: [birth year], 74 y.o.   MRN: [id number]  HPI 74 year old patient who has a history of mild intermittent asthma.  For the past week he has had increasing cough which has been nonproductive, as well as wheezing.  He has had increasing albuterol use over the past week.  Associated symptoms include wheezing, hoarseness and mild sore throat.  There is been no fever or purulent sputum production.  He has felt much improved over the past 24 hours  Past Medical History  Diagnosis Date  . Asthma   . Coronary artery disease 1991    a) Angioplasty LAD 1991 [place] b) MI in 1995 at  [facility] in [place], med Rx  . GERD (gastroesophageal reflux disease)   . Hyperlipidemia   . Myocardial infarct   . Nephrolithiasis   . Osteopenia   . HTN (hypertension)   . Diabetes mellitus   . Hearing loss     History   Social History  . Marital Status: Married    Spouse Name: N/A  . Number of Children: N/A  . Years of Education: N/A   Occupational History  . Retired    Social History Main Topics  . Smoking status: Never Smoker   . Smokeless tobacco: Never Used  . Alcohol Use: 0.0 oz/week    0 Standard drinks or equivalent per week     Comment: occ, once every 2 months  . Drug Use: No  . Sexual Activity: Not on file   Other Topics Concern  . Not on file   Social History Narrative    Past Surgical History  Procedure Laterality Date  . Inguinal hernia repair    . Ptca    . Vasectomy    . Orif tibia & fibula fractures    . Angioplasty  1991  . Eye surgery  2010    cataract - right  . Cholecystectomy  2012  . Cardiac catheterization  2005    Moderate LCx and LAD disease and severe diffuse RCA disease  . Cardiac catheterization  01/2012    normal left main, 50% pLAD stenosis, dLAD mild luminal irregularities, D1 30-40% stenosis at take off; subtotally occluded/severely diseased OM1, mid 80% stenosis in small OM2, long prox  and mid RCA stenoses ranging from 60-95%, severely diseased PDA, distal PLSA occluded filling via L-R collateral; normal LV function; inferior lateral basal akinesis.  . Left heart catheterization with coronary angiogram N/A 01/28/2012    Procedure: LEFT HEART CATHETERIZATION WITH CORONARY ANGIOGRAM;  Surgeon: Iran Ouch, MD;  Location: [facility] CATH LAB;  Service: Cardiovascular;  Laterality: N/A;  . Left heart catheterization with coronary angiogram N/A 03/17/2014    Procedure: LEFT HEART CATHETERIZATION WITH CORONARY ANGIOGRAM;  Surgeon: Kathleene Hazel, MD;  Location: [facility] CATH LAB;  Service: Cardiovascular;  Laterality: N/A;    Family History  Problem Relation Age of Onset  . Asthma Mother   . Allergies Sister   . Coronary artery disease Other     Allergies  Allergen Reactions  . Meloxicam Other (See Comments)    Pt feels anxiety attacks when on this medicine    Current Outpatient Prescriptions on File Prior to Visit  Medication Sig Dispense Refill  . albuterol (PROVENTIL HFA;VENTOLIN HFA) 108 (90 BASE) MCG/ACT inhaler Inhale 2 puffs into the lungs every 4 (four) hours as needed. For shortness of breath    . aspirin EC 81 MG tablet Take  81 mg by mouth daily.    Marland Kitchen gabapentin (NEURONTIN) 300 MG capsule Take 900 mg by mouth 2 (two) times daily.     Marland Kitchen glucose blood (PRECISION XTRA TEST STRIPS) test strip USE TO CHECK BLOOD SUGAR DAILY 100 each 3  . HYDROcodone-homatropine (HYDROMET) 5-1.5 MG/5ML syrup Take 5 mLs by mouth every 4 (four) hours as needed. 240 mL 0  . isosorbide mononitrate (IMDUR) 30 MG 24 hr tablet Take 1 tablet (30 mg total) by mouth 2 (two) times daily. 180 tablet 1  . metFORMIN (GLUCOPHAGE) 500 MG tablet Take 500 mg by mouth 2 (two) times daily with a meal.    . metoprolol (LOPRESSOR) 50 MG tablet Take 1 tablet (50 mg total) by mouth 2 (two) times daily. 180 tablet 3  . Multiple Vitamin (MULTIVITAMIN) tablet Take 1 tablet by mouth daily.      . nitroGLYCERIN  (NITROSTAT) 0.4 MG SL tablet Place 1 tablet (0.4 mg total) under the tongue every 5 (five) minutes as needed. Chest pain 25 tablet 3  . nortriptyline (PAMELOR) 25 MG capsule TAKE ONE CAPSULE BY MOUTH AT BEDTIME 90 capsule 0  . pantoprazole (PROTONIX) 40 MG tablet Take 40 mg by mouth daily. Take one half hour before eating.     . ramipril (ALTACE) 5 MG capsule TAKE ONE CAPSULE BY MOUTH ONCE DAILY 90 capsule 1  . simvastatin (ZOCOR) 80 MG tablet Take 40 mg by mouth at bedtime. Rx from [place]    . tamsulosin (FLOMAX) 0.4 MG CAPS capsule TAKE ONE CAPSULE BY MOUTH DAILY 30 capsule 5  . levothyroxine (SYNTHROID, LEVOTHROID) 25 MCG tablet Take 25 mcg by mouth daily. Pt getting Rx from [place]     No current facility-administered medications on file prior to visit.    BP 158/80 mmHg  Pulse 96  Temp(Src) 98.5 F (36.9 C) (Oral)  Resp 20  Ht 5\' 11"  (1.803 m)  Wt 224 lb (101.606 kg)  BMI 31.26 kg/m2  SpO2 97%       Review of Systems  Constitutional: Positive for activity change, appetite change and fatigue. Negative for fever and chills.  HENT: Positive for congestion, postnasal drip, rhinorrhea, sore throat and voice change. Negative for dental problem, ear pain, hearing loss, tinnitus and trouble swallowing.   Eyes: Negative for pain, discharge and visual disturbance.  Respiratory: Positive for cough and wheezing. Negative for chest tightness and stridor.   Cardiovascular: Negative for chest pain, palpitations and leg swelling.  Gastrointestinal: Negative for nausea, vomiting, abdominal pain, diarrhea, constipation, blood in stool and abdominal distention.  Genitourinary: Negative for urgency, hematuria, flank pain, discharge, difficulty urinating and genital sores.  Musculoskeletal: Negative for myalgias, back pain, joint swelling, arthralgias, gait problem and neck stiffness.  Skin: Negative for rash.  Neurological: Negative for dizziness, syncope, speech difficulty, weakness, numbness and  headaches.  Hematological: Negative for adenopathy. Does not bruise/bleed easily.  Psychiatric/Behavioral: Negative for behavioral problems and dysphoric mood. The patient is not nervous/anxious.        Objective:   Physical Exam  Constitutional: He is oriented to person, place, and time. He appears well-developed.  Repeat blood pressure 124/64  HENT:  Head: Normocephalic.  Right Ear: External ear normal.  Left Ear: External ear normal.  Eyes: Conjunctivae and EOM are normal.  Neck: Normal range of motion.  Cardiovascular: Normal rate and normal heart sounds.   Pulmonary/Chest: Effort normal. He has wheezes. He has rales.  Rare bibasilar rales Faint and expiratory wheezing No increased work  of breathing  Abdominal: Bowel sounds are normal.  Musculoskeletal: Normal range of motion. He exhibits no edema or tenderness.  Neurological: He is alert and oriented to person, place, and time.  Psychiatric: He has a normal mood and affect. His behavior is normal.          Assessment & Plan:   Asthma exacerbation secondary to viral bronchitis.  Will treat with a modest dose of prednisone twice a day for the next 5 days.  Will continue expectorants.  The patient has clinically improved nicely over the past 24 hours Diabetes Essential hypertension

## 2014-11-27 NOTE — Progress Notes (Signed)
Pre visit review using our clinic review tool, if applicable. No additional management support is needed unless otherwise documented below in the visit note. 

## 2014-12-04 ENCOUNTER — Encounter: Payer: Self-pay | Admitting: Internal Medicine

## 2014-12-04 ENCOUNTER — Ambulatory Visit (INDEPENDENT_AMBULATORY_CARE_PROVIDER_SITE_OTHER): Payer: Medicare Other | Admitting: Internal Medicine

## 2014-12-04 VITALS — BP 116/70 | HR 85 | Temp 98.1°F | Resp 20 | Ht 70.75 in | Wt 220.0 lb

## 2014-12-04 DIAGNOSIS — Z0000 Encounter for general adult medical examination without abnormal findings: Secondary | ICD-10-CM

## 2014-12-04 DIAGNOSIS — I252 Old myocardial infarction: Secondary | ICD-10-CM

## 2014-12-04 DIAGNOSIS — N183 Chronic kidney disease, stage 3 unspecified: Secondary | ICD-10-CM

## 2014-12-04 DIAGNOSIS — E785 Hyperlipidemia, unspecified: Secondary | ICD-10-CM

## 2014-12-04 DIAGNOSIS — E119 Type 2 diabetes mellitus without complications: Secondary | ICD-10-CM

## 2014-12-04 DIAGNOSIS — D539 Nutritional anemia, unspecified: Secondary | ICD-10-CM

## 2014-12-04 DIAGNOSIS — E039 Hypothyroidism, unspecified: Secondary | ICD-10-CM

## 2014-12-04 NOTE — Patient Instructions (Signed)
Limit your sodium (Salt) intake    It is important that you exercise regularly, at least 20 minutes 3 to 4 times per week.  If you develop chest pain or shortness of breath seek  medical attention.   Please check your hemoglobin A1c every 3 months  You need to lose weight.  Consider a lower calorie diet and regular exercise.  Please see your eye doctor yearly to check for diabetic eye damage  Cardiology follow-up as scheduled

## 2014-12-04 NOTE — Progress Notes (Signed)
Subjective:    Patient ID: Richard Ho, male    DOB: [birth year], 74 y.o.   MRN: [id number]  HPI   Subjective:    Patient ID: Richard Ho, male    DOB: [birth year], 74 y.o.   MRN: [id number]  HPI  74 year old patient who is seen today for a preventive health exam.  Medical problems include coronary artery disease, which has been stable.  He has type 2 diabetes complicated by diabetic peripheral neuropathy.  He is on both nortriptyline as well as Neurontin.  He has hypertension and remains on statin therapy.  No cardiopulmonary complaints today.  He is followed at [facility] and underwent esophagogastroduodenoscopy and colonoscopy in 2012.  He's had cataract extraction, surgery on the left in January of this year.  He this scheduled to see ophthalmology in follow-up next month.  He is followed by cardiology Past Medical History  Diagnosis Date  . Asthma   . Coronary artery disease 1991    a) Angioplasty LAD 1991 [place] b) MI in 1995 at  [facility] [address] in [place], med Rx  . GERD (gastroesophageal reflux disease)   . Hyperlipidemia   . Myocardial infarct   . Nephrolithiasis   . Osteopenia   . HTN (hypertension)   . Diabetes mellitus   . Hearing loss     History   Social History  . Marital Status: Married    Spouse Name: N/A  . Number of Children: N/A  . Years of Education: N/A   Occupational History  . Retired    Social History Main Topics  . Smoking status: Never Smoker   . Smokeless tobacco: Never Used  . Alcohol Use: 0.0 oz/week    0 Standard drinks or equivalent per week     Comment: occ, once every 2 months  . Drug Use: No  . Sexual Activity: Not on file   Other Topics Concern  . Not on file   Social History Narrative    Past Surgical History  Procedure Laterality Date  . Inguinal hernia repair    . Ptca    . Vasectomy    . Orif tibia & fibula fractures    . Angioplasty  1991  . Eye surgery  2010    cataract - right  .  Cholecystectomy  2012  . Cardiac catheterization  2005    Moderate LCx and LAD disease and severe diffuse RCA disease  . Cardiac catheterization  01/2012    normal left main, 50% pLAD stenosis, dLAD mild luminal irregularities, D1 30-40% stenosis at take off; subtotally occluded/severely diseased OM1, mid 80% stenosis in small OM2, long prox and mid RCA stenoses ranging from 60-95%, severely diseased PDA, distal PLSA occluded filling via L-R collateral; normal LV function; inferior lateral basal akinesis.  . Left heart catheterization with coronary angiogram N/A 01/28/2012    Procedure: LEFT HEART CATHETERIZATION WITH CORONARY ANGIOGRAM;  Surgeon: Iran Ouch, MD;  Location: [facility] CATH LAB;  Service: Cardiovascular;  Laterality: N/A;  . Left heart catheterization with coronary angiogram N/A 03/17/2014    Procedure: LEFT HEART CATHETERIZATION WITH CORONARY ANGIOGRAM;  Surgeon: Kathleene Hazel, MD;  Location: [facility] CATH LAB;  Service: Cardiovascular;  Laterality: N/A;    Family History  Problem Relation Age of Onset  . Asthma Mother   . Allergies Sister   . Coronary artery disease Other     Allergies  Allergen Reactions  . Meloxicam Other (See Comments)    Pt feels anxiety  attacks when on this medicine    Current Outpatient Prescriptions on File Prior to Visit  Medication Sig Dispense Refill  . albuterol (PROVENTIL HFA;VENTOLIN HFA) 108 (90 BASE) MCG/ACT inhaler Inhale 2 puffs into the lungs every 4 (four) hours as needed. For shortness of breath    . aspirin EC 81 MG tablet Take 81 mg by mouth daily.    . fluticasone (FLONASE) 50 MCG/ACT nasal spray Place 2 sprays into both nostrils daily as needed for allergies. 16 g 5  . gabapentin (NEURONTIN) 300 MG capsule Take 900 mg by mouth 2 (two) times daily.     Marland Kitchen. glucose blood (PRECISION XTRA TEST STRIPS) test strip USE TO CHECK BLOOD SUGAR DAILY 100 each 3  . HYDROcodone-homatropine (HYCODAN) 5-1.5 MG/5ML syrup Take 5 mLs by mouth every 6  (six) hours as needed for cough. 120 mL 0  . hyoscyamine (LEVSIN SL) 0.125 MG SL tablet Place 1 tablet (0.125 mg total) under the tongue every 4 (four) hours as needed. 90 tablet 5  . isosorbide mononitrate (IMDUR) 30 MG 24 hr tablet Take 1 tablet (30 mg total) by mouth 2 (two) times daily. 180 tablet 1  . metFORMIN (GLUCOPHAGE) 500 MG tablet Take 500 mg by mouth 2 (two) times daily with a meal.    . metoprolol (LOPRESSOR) 50 MG tablet Take 1 tablet (50 mg total) by mouth 2 (two) times daily. 180 tablet 3  . Multiple Vitamin (MULTIVITAMIN) tablet Take 1 tablet by mouth daily.      . nitroGLYCERIN (NITROSTAT) 0.4 MG SL tablet Place 1 tablet (0.4 mg total) under the tongue every 5 (five) minutes as needed. Chest pain 25 tablet 3  . nortriptyline (PAMELOR) 25 MG capsule TAKE ONE CAPSULE BY MOUTH AT BEDTIME 90 capsule 0  . pantoprazole (PROTONIX) 40 MG tablet Take 40 mg by mouth daily. Take one half hour before eating.     . ramipril (ALTACE) 5 MG capsule TAKE ONE CAPSULE BY MOUTH ONCE DAILY 90 capsule 1  . simvastatin (ZOCOR) 80 MG tablet Take 40 mg by mouth at bedtime. Rx from [facility]    . tamsulosin (FLOMAX) 0.4 MG CAPS capsule TAKE ONE CAPSULE BY MOUTH DAILY 30 capsule 5  . levothyroxine (SYNTHROID, LEVOTHROID) 25 MCG tablet Take 25 mcg by mouth daily. Pt getting Rx from [facility]     No current facility-administered medications on file prior to visit.    BP 116/70 mmHg  Pulse 85  Temp(Src) 98.1 F (36.7 C) (Oral)  Resp 20  Ht 5' 10.75" (1.797 m)  Wt 220 lb (99.791 kg)  BMI 30.90 kg/m2  SpO2 96%    1. Risk factors, based on past  M,S,F history-patient has known cardiac disease.  Status post PCI in the past.  Risk factors include hypertension, diabetes, and dyslipidemia  2.  Physical activities: No restrictions.  Has been in cardiac rehabilitation recently  3.  Depression/mood: No history of depression or mood disorder  4.  Hearing: Moderate deficits with tinnitus.  Has recent been [facility]  [facility]  5.  ADL's: Totally independent  6.  Fall risk: Low  7.  Home safety: No problems identified  8.  Height weight, and visual acuity; height and weight stable, status post recent left cataract extraction, surgery.  He states his vision is 20/20 on the left and corrected to 20/20 on the right with glasses  9.  Counseling: Weight loss modest exercise regimen.  Encouraged  10. Lab orders  based on risk factors: Hemoglobin A1c will be reviewed.    11. Referral : Follow cardiology  12. Care plan: Continue heart healthy diet and has had some modest weight loss.  Poorly hemoglobin A1c is  13. Cognitive assessment: Alert and appropriate.  Normal affect.  No cognitive dysfunction  14.  Preventive services will include annual health examinations with screening lab.  He'll continue quarterly hemoglobin A1c evaluations.  He'll be followed periodically by cardiology and will have annual eye examinations performed.  Renal indices will be followed at least twice annually. Patient was provided with a written and personalized care plan  15.  Provider list includes primary care ophthalmology, cardiology.        Review of Systems  Constitutional: Negative for fever, chills, activity change, appetite change and fatigue.  HENT: Negative for congestion, dental problem, ear pain, hearing loss, mouth sores, rhinorrhea, sinus pressure, sneezing, tinnitus, trouble swallowing and voice change.   Eyes: Negative for photophobia, pain, redness and visual disturbance.  Respiratory: Negative for apnea, cough, choking, chest tightness, shortness of breath and wheezing.   Cardiovascular: Negative for chest pain, palpitations and leg swelling.  Gastrointestinal: Negative for nausea, vomiting, abdominal pain, diarrhea, constipation, blood in stool, abdominal distention, anal bleeding and rectal pain.  Genitourinary: Negative for dysuria, urgency, frequency, hematuria, flank pain,  decreased urine volume, discharge, penile swelling, scrotal swelling, difficulty urinating, genital sores and testicular pain.  Musculoskeletal: Negative for arthralgias, back pain, gait problem, joint swelling, myalgias, neck pain and neck stiffness.  Skin: Negative for color change, rash and wound.  Neurological: Positive for numbness. Negative for dizziness, tremors, seizures, syncope, facial asymmetry, speech difficulty, weakness, light-headedness and headaches.  Hematological: Negative for adenopathy. Does not bruise/bleed easily.  Psychiatric/Behavioral: Negative for suicidal ideas, hallucinations, behavioral problems, confusion, sleep disturbance, self-injury, dysphoric mood, decreased concentration and agitation. The patient is not nervous/anxious.        Objective:   Physical Exam  Constitutional: He appears well-developed and well-nourished.  HENT:  Head: Normocephalic and atraumatic.  Right Ear: External ear normal.  Left Ear: External ear normal.  Nose: Nose normal.  Mouth/Throat: Oropharynx is clear and moist.  Hearing aids in place bilaterally  Eyes: Conjunctivae and EOM are normal. Pupils are equal, round, and reactive to light. No scleral icterus.  Neck: Normal range of motion. Neck supple. No JVD present. No thyromegaly present.  Cardiovascular: Regular rhythm, normal heart sounds and intact distal pulses.  Exam reveals no gallop and no friction rub.   No murmur heard. Pulmonary/Chest: Effort normal and breath sounds normal. He exhibits no tenderness.  Abdominal: Soft. Bowel sounds are normal. He exhibits no distension and no mass. There is no tenderness.  Genitourinary: Prostate normal and penis normal.  Musculoskeletal: Normal range of motion. He exhibits no edema and no tenderness.  Lymphadenopathy:    He has no cervical adenopathy.  Neurological: He is alert. He has normal reflexes. No cranial nerve deficit. Coordination normal.  Skin: Skin is warm and dry. No rash  noted.  Psychiatric: He has a normal mood and affect. His behavior is normal.          Assessment & Plan:  Preventive health exam Diabetes mellitus.  Appears to be, well-controlled.  We'll check a hemoglobin A1c Coronary artery disease, stable Hypertension stable Dyslipidemia.  Continue statin therapy  check 3 months    Review of Systems    as above Objective:   Physical Exam  Pulmonary/Chest: He has rales.  Bibasal rales without wheezing.  O2 saturation 96    As above      Assessment & Plan:   Preventive health examination Follow-up cardiology and ophthalmology as scheduled  Weight loss more exercise encouraged No change in medical regimen  Recheck 3 months

## 2014-12-04 NOTE — Progress Notes (Signed)
Pre visit review using our clinic review tool, if applicable. No additional management support is needed unless otherwise documented below in the visit note. 

## 2014-12-22 LAB — HM DIABETES EYE EXAM

## 2015-01-29 ENCOUNTER — Ambulatory Visit: Payer: Medicare Other | Admitting: Internal Medicine

## 2015-01-31 ENCOUNTER — Other Ambulatory Visit: Payer: Self-pay | Admitting: Internal Medicine

## 2015-02-01 LAB — HM DIABETES EYE EXAM

## 2015-02-15 ENCOUNTER — Encounter: Payer: Medicare Other | Admitting: Cardiovascular Disease

## 2015-02-15 ENCOUNTER — Ambulatory Visit (INDEPENDENT_AMBULATORY_CARE_PROVIDER_SITE_OTHER): Payer: Medicare Other | Admitting: Cardiovascular Disease

## 2015-02-15 ENCOUNTER — Encounter: Payer: Self-pay | Admitting: Cardiovascular Disease

## 2015-02-15 VITALS — BP 110/62 | HR 76 | Ht 70.0 in | Wt 228.4 lb

## 2015-02-15 DIAGNOSIS — E785 Hyperlipidemia, unspecified: Secondary | ICD-10-CM

## 2015-02-15 DIAGNOSIS — I10 Essential (primary) hypertension: Secondary | ICD-10-CM

## 2015-02-15 DIAGNOSIS — I493 Ventricular premature depolarization: Secondary | ICD-10-CM

## 2015-02-15 DIAGNOSIS — I2510 Atherosclerotic heart disease of native coronary artery without angina pectoris: Secondary | ICD-10-CM | POA: Diagnosis not present

## 2015-02-15 NOTE — Progress Notes (Signed)
Chief Complaint  Patient presents with  . Cough      History of Present Illness: 74 yo male with history of CAD, HTN, HLD, DM here today for cardiac follow up. He has a history of angioplasty in [place] and in [place] in the 1990s. Cardiac cath in 2005 showed severe, diffuse RCA system disease. He was admitted in August 2013 with exertional chest pain and dyspnea. He had a left heart cath showing significant disease in small OMs as well as severe diffuse RCA system disease, similar to the prior cath. He was managed medically as there were not good interventional targets in the RCA system. He was started on Imdur 30 mg daily. With recent exertional chest pain, I arranged a stress myoview 01/23/14 which showed possible inferior ischemia, LVEF=48%. This is in the distribution of his known severe RCA disease that has been medically for years. Cardiac cath 03/17/14 as below with stable moderate CAD.   He is here today for follow up. He is feeling well. No chest pain or SOB. He has been having a cough which he feels is from his allergies. He is seeing primary care soon.   Primary Care Physician: Amador Cunas  Last Lipid Profile:Lipid Panel     Component Value Date/Time   CHOL 151 01/26/2014 1134   TRIG 190.0* 01/26/2014 1134   HDL 43.80 01/26/2014 1134   CHOLHDL 3 01/26/2014 1134   VLDL 38.0 01/26/2014 1134   LDLCALC 69 01/26/2014 1134     Past Medical History  Diagnosis Date  . Asthma   . Coronary artery disease 1991    a) Angioplasty LAD 1991 [place] b) MI in 1995 at  [facility] in [place], med Rx  . GERD (gastroesophageal reflux disease)   . Hyperlipidemia   . Myocardial infarct   . Nephrolithiasis   . Osteopenia   . HTN (hypertension)   . Diabetes mellitus   . Hearing loss     Past Surgical History  Procedure Laterality Date  . Inguinal hernia repair    . Ptca    . Vasectomy    . Orif tibia & fibula fractures    . Angioplasty  1991  . Eye surgery  2010   cataract - right  . Cholecystectomy  2012  . Cardiac catheterization  2005    Moderate LCx and LAD disease and severe diffuse RCA disease  . Cardiac catheterization  01/2012    normal left main, 50% pLAD stenosis, dLAD mild luminal irregularities, D1 30-40% stenosis at take off; subtotally occluded/severely diseased OM1, mid 80% stenosis in small OM2, long prox and mid RCA stenoses ranging from 60-95%, severely diseased PDA, distal PLSA occluded filling via L-R collateral; normal LV function; inferior lateral basal akinesis.  . Left heart catheterization with coronary angiogram N/A 01/28/2012    Procedure: LEFT HEART CATHETERIZATION WITH CORONARY ANGIOGRAM;  Surgeon: Iran Ouch, MD;  Location: [facility] CATH LAB;  Service: Cardiovascular;  Laterality: N/A;  . Left heart catheterization with coronary angiogram N/A 03/17/2014    Procedure: LEFT HEART CATHETERIZATION WITH CORONARY ANGIOGRAM;  Surgeon: Kathleene Hazel, MD;  Location: [facility] CATH LAB;  Service: Cardiovascular;  Laterality: N/A;    Current Outpatient Prescriptions  Medication Sig Dispense Refill  . albuterol (PROVENTIL HFA;VENTOLIN HFA) 108 (90 BASE) MCG/ACT inhaler Inhale 2 puffs into the lungs every 4 (four) hours as needed. For shortness of breath    . aspirin EC 81 MG tablet Take 81 mg by mouth daily.    Marland Kitchen  fluticasone (FLONASE) 50 MCG/ACT nasal spray Place 2 sprays into both nostrils daily as needed for allergies. 16 g 5  . gabapentin (NEURONTIN) 300 MG capsule Take 900 mg by mouth 2 (two) times daily.     Marland Kitchen glucose blood (PRECISION XTRA TEST STRIPS) test strip USE TO CHECK BLOOD SUGAR DAILY 100 each 3  . HYDROcodone-homatropine (HYCODAN) 5-1.5 MG/5ML syrup Take 5 mLs by mouth every 6 (six) hours as needed for cough. 120 mL 0  . hyoscyamine (LEVSIN SL) 0.125 MG SL tablet Place 1 tablet (0.125 mg total) under the tongue every 4 (four) hours as needed. 90 tablet 5  . isosorbide mononitrate (IMDUR) 30 MG 24 hr tablet Take 1 tablet (30  mg total) by mouth 2 (two) times daily. 180 tablet 1  . metFORMIN (GLUCOPHAGE) 500 MG tablet Take 500 mg by mouth 2 (two) times daily with a meal.    . metoprolol (LOPRESSOR) 50 MG tablet Take 1 tablet (50 mg total) by mouth 2 (two) times daily. 180 tablet 3  . Multiple Vitamin (MULTIVITAMIN) tablet Take 1 tablet by mouth daily.      . nitroGLYCERIN (NITROSTAT) 0.4 MG SL tablet Place 1 tablet (0.4 mg total) under the tongue every 5 (five) minutes as needed. Chest pain 25 tablet 3  . nortriptyline (PAMELOR) 25 MG capsule TAKE ONE CAPSULE BY MOUTH AT BEDTIME 90 capsule 0  . pantoprazole (PROTONIX) 40 MG tablet Take 40 mg by mouth daily. Take one half hour before eating.     . ramipril (ALTACE) 5 MG capsule TAKE ONE CAPSULE BY MOUTH ONCE DAILY 90 capsule 1  . ramipril (ALTACE) 5 MG capsule TAKE ONE CAPSULE BY MOUTH ONCE DAILY 90 capsule 1  . simvastatin (ZOCOR) 80 MG tablet Take 40 mg by mouth at bedtime. Rx from [place]    . tamsulosin (FLOMAX) 0.4 MG CAPS capsule TAKE ONE CAPSULE BY MOUTH DAILY 30 capsule 5  . levothyroxine (SYNTHROID, LEVOTHROID) 25 MCG tablet Take 25 mcg by mouth daily. Pt getting Rx from [place]     No current facility-administered medications for this visit.    Allergies  Allergen Reactions  . Meloxicam Other (See Comments)    Pt feels anxiety attacks when on this medicine    Social History   Social History  . Marital Status: Married    Spouse Name: N/A  . Number of Children: N/A  . Years of Education: N/A   Occupational History  . Retired    Social History Main Topics  . Smoking status: Never Smoker   . Smokeless tobacco: Never Used  . Alcohol Use: 0.0 oz/week    0 Standard drinks or equivalent per week     Comment: occ, once every 2 months  . Drug Use: No  . Sexual Activity: Not on file   Other Topics Concern  . Not on file   Social History Narrative    Family History  Problem Relation Age of Onset  . Asthma Mother   . Allergies Sister   . Coronary  artery disease Other     Review of Systems:  As stated in the HPI and otherwise negative.   BP 110/62 mmHg  Pulse 76  Ht  (1.778 m)  Wt 228 lb 6.4 oz (103.602 kg)  BMI 32.77 kg/m2  SpO2 96%  Physical Examination: General: Well developed, well nourished, NAD HEENT: OP clear, mucus membranes moist SKIN: warm, dry. No rashes. Neuro: No focal deficits Musculoskeletal: Muscle strength 5/5 all ext Psychiatric:  Mood and affect normal Neck: No JVD, no carotid bruits, no thyromegaly, no lymphadenopathy. Lungs:Clear bilaterally, no wheezes, rhonci, crackles Cardiovascular: Regular rate and rhythm. No murmurs, gallops or rubs. Abdomen:Soft. Bowel sounds present. Non-tender.  Extremities: No lower extremity edema. Pulses are 2 + in the bilateral DP/PT.  Stress myoview 01/23/14:  Stress Procedure: The patient received IV Lexiscan 0.4 mg over 15-seconds with concurrent low level exercise and then Technetium 27m Sestamibi was injected at 30-seconds while the patient continued walking one more minute. Quantitative spect images were obtained after a 45-minute delay.  Stress ECG: No significant change from baseline ECG  QPS  Raw Data Images: There is interference from nuclear activity from structures below the diaphragm. This does not affect the ability to read the study.  Stress Images: There is decreased uptake in the inferior wall.  Rest Images: There is decreased uptake in the inferior wall.  Subtraction (SDS): These findings are consistent with ischemia.  Transient Ischemic Dilatation (Normal <1.22): 1.33  Lung/Heart Ratio (Normal <0.45): 0.33  Quantitative Gated Spect Images  QGS EDV: 119 ml  QGS ESV: 62 ml  Impression  Exercise Capacity: Lexiscan with low level exercise.  BP Response: Normal blood pressure response.  Clinical Symptoms: No symptoms.  ECG Impression: No significant ECG changes with Lexiscan.  Comparison with Prior Nuclear Study: No images to compare  Overall  Impression: Intermediate risk stress nuclear study with reversible inferior ischemia. There is underlying bowel, however, the SDS is 11. Extent 23%..  LV Ejection Fraction: 48%. LV Wall Motion: Mild inferior hypokinesis.  Cardiac cath 03/17/14: Left Main: No obstructive disease.  Left anterior Descending: Large caliber vessel that courses to the apex. The mid vessel has a 50% stenosis involving the takeoff of both diagonal branches. The distal LAD has minor luminal irregularities. The first diagonal is a moderate caliber vessel with ostial 30-40% stenosis at the ostium.  Left Circumflex: Large vessel that trifurcates quickly into 3 branches. The first OM is small in caliber and is sub-totally occluded proximally, unchanged from last cath. The second OM is small in caliber and has a mid 80% stenosis. The AV groove branch is small and provides collateral flow the the distal RCA.  Right Coronary Artery: Large dominant vessel with proximal 60% stenosis followed by 100% total occlusion in the mid vessel. There are small bridging collaterals that supply the mid and distal vessel. The distal vessel is also seen to fill from left to right collaterals.  Left Ventricular Angiogram: LVEF=45%.   EKG:  EKG is not ordered today. The ekg ordered today demonstrates   Recent Labs: 03/07/2014: Hemoglobin 11.8*; Platelets 263.0 09/11/2014: BUN 22; Creatinine, Ser 1.47; Potassium 5.0; Sodium 134*   Lipid Panel    Component Value Date/Time   CHOL 151 01/26/2014 1134   TRIG 190.0* 01/26/2014 1134   HDL 43.80 01/26/2014 1134   CHOLHDL 3 01/26/2014 1134   VLDL 38.0 01/26/2014 1134   LDLCALC 69 01/26/2014 1134     Wt Readings from Last 3 Encounters:  02/15/15 228 lb 6.4 oz (103.602 kg)  12/04/14 220 lb (99.791 kg)  11/27/14 224 lb (101.606 kg)     Other studies Reviewed: Additional studies/ records that were reviewed today include: . Review of the above records demonstrates:    Assessment and Plan:    1. CAD: Stable by cath October 2015. Continue ASA, statin, beta blocker, Ace-inh,Imdur.    2. HTN: BP controlled. No changes today  3. HLD: Continue statin. Lipids well controlled. He  has plans for repeat testing in two weeks in primary care.   4. PVCs: Will continue Lopressor 50 mg po BID. If he continues to have asthma issues, can consider changing this to Cardizem.   Current medicines are reviewed at length with the patient today.  The patient does not have concerns regarding medicines.  The following changes have been made:  no change  Labs/ tests ordered today include:  No orders of the defined types were placed in this encounter.     Disposition:   FU with me in 6 months   Signed, Verne Carrow, MD 02/15/2015 10:07 AM    [facility] [address] [place], [place], [place]  [address] Phone: [phone]; Fax: [phone]

## 2015-02-15 NOTE — Patient Instructions (Signed)

## 2015-02-23 NOTE — Progress Notes (Signed)
   Subjective:    Patient ID: Richard Ho, male    DOB: December 25, 1940, 74 y.o.   MRN: [id number]  HPI    Review of Systems     Objective:   Physical Exam        Assessment & Plan:

## 2015-02-26 ENCOUNTER — Other Ambulatory Visit (INDEPENDENT_AMBULATORY_CARE_PROVIDER_SITE_OTHER): Payer: Medicare Other

## 2015-02-26 DIAGNOSIS — I10 Essential (primary) hypertension: Secondary | ICD-10-CM

## 2015-02-26 DIAGNOSIS — E119 Type 2 diabetes mellitus without complications: Secondary | ICD-10-CM

## 2015-02-26 DIAGNOSIS — E785 Hyperlipidemia, unspecified: Secondary | ICD-10-CM

## 2015-02-26 LAB — CBC WITH DIFFERENTIAL/PLATELET
Basophils Absolute: 0 10*3/uL (ref 0.0–0.1)
Basophils Relative: 0.4 % (ref 0.0–3.0)
Eosinophils Absolute: 0.2 10*3/uL (ref 0.0–0.7)
Eosinophils Relative: 2.6 % (ref 0.0–5.0)
HCT: 39.5 % (ref 39.0–52.0)
Hemoglobin: 13.1 g/dL (ref 13.0–17.0)
Lymphocytes Relative: 32.9 % (ref 12.0–46.0)
Lymphs Abs: 2.1 10*3/uL (ref 0.7–4.0)
MCHC: 33.2 g/dL (ref 30.0–36.0)
MCV: 100.5 fl — ABNORMAL HIGH (ref 78.0–100.0)
Monocytes Absolute: 0.7 10*3/uL (ref 0.1–1.0)
Monocytes Relative: 10.9 % (ref 3.0–12.0)
Neutro Abs: 3.5 10*3/uL (ref 1.4–7.7)
Neutrophils Relative %: 53.2 % (ref 43.0–77.0)
Platelets: 275 10*3/uL (ref 150.0–400.0)
RBC: 3.93 Mil/uL — ABNORMAL LOW (ref 4.22–5.81)
RDW: 13.4 % (ref 11.5–15.5)
WBC: 6.5 10*3/uL (ref 4.0–10.5)

## 2015-02-26 LAB — LIPID PANEL
Cholesterol: 129 mg/dL (ref 0–200)
HDL: 34.4 mg/dL — ABNORMAL LOW (ref >39.00)
LDL Cholesterol: 68 mg/dL (ref 0–99)
NONHDL: 94.11
Total CHOL/HDL Ratio: 4
Triglycerides: 132 mg/dL (ref 0.0–149.0)
VLDL: 26.4 mg/dL (ref 0.0–40.0)

## 2015-02-26 LAB — COMPREHENSIVE METABOLIC PANEL
ALBUMIN: 4.3 g/dL (ref 3.5–5.2)
ALK PHOS: 75 U/L (ref 39–117)
ALT: 36 U/L (ref 0–53)
AST: 27 U/L (ref 0–37)
BILIRUBIN TOTAL: 0.5 mg/dL (ref 0.2–1.2)
BUN: 31 mg/dL — AB (ref 6–23)
CHLORIDE: 102 meq/L (ref 96–112)
CO2: 26 mEq/L (ref 19–32)
CREATININE: 1.57 mg/dL — AB (ref 0.40–1.50)
Calcium: 10.4 mg/dL (ref 8.4–10.5)
GFR: 46.12 mL/min — ABNORMAL LOW (ref >60.00)
Glucose, Bld: 121 mg/dL — ABNORMAL HIGH (ref 70–99)
Potassium: 4.9 mEq/L (ref 3.5–5.1)
SODIUM: 137 meq/L (ref 135–145)
TOTAL PROTEIN: 7.4 g/dL (ref 6.0–8.3)

## 2015-02-26 LAB — HEMOGLOBIN A1C: HEMOGLOBIN A1C: 6.9 % — AB (ref 4.6–6.5)

## 2015-02-28 LAB — HM DIABETES EYE EXAM

## 2015-03-05 ENCOUNTER — Encounter: Payer: Self-pay | Admitting: Internal Medicine

## 2015-03-05 ENCOUNTER — Ambulatory Visit (INDEPENDENT_AMBULATORY_CARE_PROVIDER_SITE_OTHER): Payer: Medicare Other | Admitting: Internal Medicine

## 2015-03-05 VITALS — BP 130/78 | HR 92 | Temp 98.1°F | Resp 20 | Ht 70.0 in | Wt 226.0 lb

## 2015-03-05 DIAGNOSIS — G629 Polyneuropathy, unspecified: Secondary | ICD-10-CM

## 2015-03-05 DIAGNOSIS — Z23 Encounter for immunization: Secondary | ICD-10-CM | POA: Diagnosis not present

## 2015-03-05 DIAGNOSIS — I252 Old myocardial infarction: Secondary | ICD-10-CM

## 2015-03-05 DIAGNOSIS — E1142 Type 2 diabetes mellitus with diabetic polyneuropathy: Secondary | ICD-10-CM

## 2015-03-05 DIAGNOSIS — I10 Essential (primary) hypertension: Secondary | ICD-10-CM

## 2015-03-05 DIAGNOSIS — E1342 Other specified diabetes mellitus with diabetic polyneuropathy: Secondary | ICD-10-CM

## 2015-03-05 DIAGNOSIS — E039 Hypothyroidism, unspecified: Secondary | ICD-10-CM

## 2015-03-05 DIAGNOSIS — E785 Hyperlipidemia, unspecified: Secondary | ICD-10-CM | POA: Diagnosis not present

## 2015-03-05 NOTE — Patient Instructions (Signed)
Please check your hemoglobin A1c every 3 months  Limit your sodium (Salt) intake    It is important that you exercise regularly, at least 20 minutes 3 to 4 times per week.  If you develop chest pain or shortness of breath seek  medical attention.  You need to lose weight.  Consider a lower calorie diet and regular exercise. 

## 2015-03-05 NOTE — Progress Notes (Signed)
Subjective:    Patient ID: Richard Ho, male    DOB: Feb 25, 1941, 74 y.o.   MRN: [id number]  HPI  Lab Results  Component Value Date   HGBA1C 6.9* 02/26/2015    Wt Readings from Last 3 Encounters:  03/05/15 226 lb (102.513 kg)  02/15/15 228 lb 6.4 oz (103.602 kg)  12/04/14 220 lb (99.791 kg)    BP Readings from Last 3 Encounters:  03/05/15 130/78  02/15/15 110/62  12/04/14 57/31    74 year old patient who is seen today for his quarterly follow-up.  Recent hemoglobin A1c 6.9.  Only complaint today is cough of 6 weeks' duration.  There is some minimal sputum production.  He has been seen by cardiology recently and has also had an eye examination  Past Medical History  Diagnosis Date  . Asthma   . Coronary artery disease 1991    a) Angioplasty LAD 1991 [place] b) MI in 1995 at  [facility] in [place], med Rx  . GERD (gastroesophageal reflux disease)   . Hyperlipidemia   . Myocardial infarct   . Nephrolithiasis   . Osteopenia   . HTN (hypertension)   . Diabetes mellitus   . Hearing loss     Social History   Social History  . Marital Status: Married    Spouse Name: N/A  . Number of Children: N/A  . Years of Education: N/A   Occupational History  . Retired    Social History Main Topics  . Smoking status: Never Smoker   . Smokeless tobacco: Never Used  . Alcohol Use: 0.0 oz/week    0 Standard drinks or equivalent per week     Comment: occ, once every 2 months  . Drug Use: No  . Sexual Activity: Not on file   Other Topics Concern  . Not on file   Social History Narrative    Past Surgical History  Procedure Laterality Date  . Inguinal hernia repair    . Ptca    . Vasectomy    . Orif tibia & fibula fractures    . Angioplasty  1991  . Eye surgery  2010    cataract - right  . Cholecystectomy  2012  . Cardiac catheterization  2005    Moderate LCx and LAD disease and severe diffuse RCA disease  . Cardiac catheterization  01/2012    normal  left main, 50% pLAD stenosis, dLAD mild luminal irregularities, D1 30-40% stenosis at take off; subtotally occluded/severely diseased OM1, mid 80% stenosis in small OM2, long prox and mid RCA stenoses ranging from 60-95%, severely diseased PDA, distal PLSA occluded filling via L-R collateral; normal LV function; inferior lateral basal akinesis.  . Left heart catheterization with coronary angiogram N/A 01/28/2012    Procedure: LEFT HEART CATHETERIZATION WITH CORONARY ANGIOGRAM;  Surgeon: Iran Ouch, MD;  Location: [facility] CATH LAB;  Service: Cardiovascular;  Laterality: N/A;  . Left heart catheterization with coronary angiogram N/A 03/17/2014    Procedure: LEFT HEART CATHETERIZATION WITH CORONARY ANGIOGRAM;  Surgeon: Kathleene Hazel, MD;  Location: [facility] CATH LAB;  Service: Cardiovascular;  Laterality: N/A;    Family History  Problem Relation Age of Onset  . Asthma Mother   . Allergies Sister   . Coronary artery disease Other     Allergies  Allergen Reactions  . Meloxicam Other (See Comments)    Pt feels anxiety attacks when on this medicine    Current Outpatient Prescriptions on File Prior to Visit  Medication  Sig Dispense Refill  . albuterol (PROVENTIL HFA;VENTOLIN HFA) 108 (90 BASE) MCG/ACT inhaler Inhale 2 puffs into the lungs every 4 (four) hours as needed. For shortness of breath    . aspirin EC 81 MG tablet Take 81 mg by mouth daily.    . fluticasone (FLONASE) 50 MCG/ACT nasal spray Place 2 sprays into both nostrils daily as needed for allergies. 16 g 5  . gabapentin (NEURONTIN) 300 MG capsule Take 900 mg by mouth 2 (two) times daily.     Marland Kitchen glucose blood (PRECISION XTRA TEST STRIPS) test strip USE TO CHECK BLOOD SUGAR DAILY 100 each 3  . HYDROcodone-homatropine (HYCODAN) 5-1.5 MG/5ML syrup Take 5 mLs by mouth every 6 (six) hours as needed for cough. 120 mL 0  . hyoscyamine (LEVSIN SL) 0.125 MG SL tablet Place 1 tablet (0.125 mg total) under the tongue every 4 (four) hours as  needed. 90 tablet 5  . isosorbide mononitrate (IMDUR) 30 MG 24 hr tablet Take 1 tablet (30 mg total) by mouth 2 (two) times daily. 180 tablet 1  . metFORMIN (GLUCOPHAGE) 500 MG tablet Take 500 mg by mouth 2 (two) times daily with a meal.    . metoprolol (LOPRESSOR) 50 MG tablet Take 1 tablet (50 mg total) by mouth 2 (two) times daily. 180 tablet 3  . Multiple Vitamin (MULTIVITAMIN) tablet Take 1 tablet by mouth daily.      . nitroGLYCERIN (NITROSTAT) 0.4 MG SL tablet Place 1 tablet (0.4 mg total) under the tongue every 5 (five) minutes as needed. Chest pain 25 tablet 3  . nortriptyline (PAMELOR) 25 MG capsule TAKE ONE CAPSULE BY MOUTH AT BEDTIME 90 capsule 0  . pantoprazole (PROTONIX) 40 MG tablet Take 40 mg by mouth daily. Take one half hour before eating.     . ramipril (ALTACE) 5 MG capsule TAKE ONE CAPSULE BY MOUTH ONCE DAILY 90 capsule 1  . simvastatin (ZOCOR) 80 MG tablet Take 40 mg by mouth at bedtime. Rx from [place]    . tamsulosin (FLOMAX) 0.4 MG CAPS capsule TAKE ONE CAPSULE BY MOUTH DAILY 30 capsule 5  . levothyroxine (SYNTHROID, LEVOTHROID) 25 MCG tablet Take 25 mcg by mouth daily. Pt getting Rx from [place]     No current facility-administered medications on file prior to visit.    BP 130/78 mmHg  Pulse 92  Temp(Src) 98.1 F (36.7 C) (Oral)  Resp 20  Ht  (1.778 m)  Wt 226 lb (102.513 kg)  BMI 32.43 kg/m2  SpO2 97%      Review of Systems  Constitutional: Negative for fever, chills, appetite change and fatigue.  HENT: Negative for congestion, dental problem, ear pain, hearing loss, sore throat, tinnitus, trouble swallowing and voice change.   Eyes: Negative for pain, discharge and visual disturbance.  Respiratory: Positive for cough. Negative for chest tightness, wheezing and stridor.   Cardiovascular: Negative for chest pain, palpitations and leg swelling.  Gastrointestinal: Negative for nausea, vomiting, abdominal pain, diarrhea, constipation, blood in stool and  abdominal distention.  Genitourinary: Negative for urgency, hematuria, flank pain, discharge, difficulty urinating and genital sores.  Musculoskeletal: Negative for myalgias, back pain, joint swelling, arthralgias, gait problem and neck stiffness.  Skin: Negative for rash.  Neurological: Negative for dizziness, syncope, speech difficulty, weakness, numbness and headaches.  Hematological: Negative for adenopathy. Does not bruise/bleed easily.  Psychiatric/Behavioral: Negative for behavioral problems and dysphoric mood. The patient is not nervous/anxious.        Objective:   Physical Exam  Constitutional: He is oriented to person, place, and time. He appears well-developed.  HENT:  Head: Normocephalic.  Right Ear: External ear normal.  Left Ear: External ear normal.  Eyes: Conjunctivae and EOM are normal.  Neck: Normal range of motion.  Cardiovascular: Normal rate and normal heart sounds.   Pulmonary/Chest: He has rales.  Bibasilar rales-chronic  Abdominal: Bowel sounds are normal.  Musculoskeletal: Normal range of motion. He exhibits no edema or tenderness.  Neurological: He is alert and oriented to person, place, and time.  Psychiatric: He has a normal mood and affect. His behavior is normal.          Assessment & Plan:   Cough, probably more allergic and related to seasonal change.  Doubt secondary to Ace inhibition.  Pulmonary exam has bibasal rales which have been chronic.  Will resume Flonase and add a nonsedating antihistamine and observe Diabetes mellitus stable Peripheral neuropathy Hypertension Dyslipidemia.  Continue statin therapy Coronary artery disease, stable  Recheck 3 months

## 2015-03-05 NOTE — Progress Notes (Signed)
Pre visit review using our clinic review tool, if applicable. No additional management support is needed unless otherwise documented below in the visit note. 

## 2015-03-11 ENCOUNTER — Other Ambulatory Visit: Payer: Self-pay | Admitting: Internal Medicine

## 2015-03-12 ENCOUNTER — Telehealth: Payer: Self-pay | Admitting: Internal Medicine

## 2015-03-12 NOTE — Telephone Encounter (Signed)
Spoke to pt, told him to apply ice to site and take Tylenol and come by office tomorrow so we can look at it. Pt verbalized understanding. Pt added to schedule at 8:00 AM.

## 2015-03-12 NOTE — Telephone Encounter (Signed)
Patient has a flu shot one week ago. Has minimal heat at the site but has bruising at least 2 inches around (3 inches above and below) along with very blood red burst vessels. Please call.

## 2015-03-13 ENCOUNTER — Encounter: Payer: Self-pay | Admitting: Internal Medicine

## 2015-03-13 ENCOUNTER — Ambulatory Visit (INDEPENDENT_AMBULATORY_CARE_PROVIDER_SITE_OTHER): Payer: Medicare Other | Admitting: Internal Medicine

## 2015-03-13 VITALS — BP 130/80 | HR 76 | Temp 98.1°F | Resp 20 | Ht 70.0 in | Wt 227.0 lb

## 2015-03-13 DIAGNOSIS — N183 Chronic kidney disease, stage 3 unspecified: Secondary | ICD-10-CM

## 2015-03-13 DIAGNOSIS — R58 Hemorrhage, not elsewhere classified: Secondary | ICD-10-CM

## 2015-03-13 DIAGNOSIS — I10 Essential (primary) hypertension: Secondary | ICD-10-CM | POA: Diagnosis not present

## 2015-03-13 NOTE — Progress Notes (Signed)
Pre visit review using our clinic review tool, if applicable. No additional management support is needed unless otherwise documented below in the visit note. 

## 2015-03-13 NOTE — Patient Instructions (Signed)
Call or return to clinic prn if these symptoms worsen or fail to improve as anticipated.

## 2015-03-13 NOTE — Progress Notes (Signed)
Subjective:    Patient ID: Richard Ho, male    DOB: Feb 19, 1941, 74 y.o.   MRN: [id number]  HPI  74 year old patient who has a history of essential hypertension and diabetes was seen recently for a flu vaccine to the left deltoid area. He has noticed some discomfort and bruising in this area.  Past Medical History  Diagnosis Date  . Asthma   . Coronary artery disease 1991    a) Angioplasty LAD 1991 [place] b) MI in 1995 at  [facility] in [place], med Rx  . GERD (gastroesophageal reflux disease)   . Hyperlipidemia   . Myocardial infarct (HCC)   . Nephrolithiasis   . Osteopenia   . HTN (hypertension)   . Diabetes mellitus   . Hearing loss     Social History   Social History  . Marital Status: Married    Spouse Name: N/A  . Number of Children: N/A  . Years of Education: N/A   Occupational History  . Retired    Social History Main Topics  . Smoking status: Never Smoker   . Smokeless tobacco: Never Used  . Alcohol Use: 0.0 oz/week    0 Standard drinks or equivalent per week     Comment: occ, once every 2 months  . Drug Use: No  . Sexual Activity: Not on file   Other Topics Concern  . Not on file   Social History Narrative    Past Surgical History  Procedure Laterality Date  . Inguinal hernia repair    . Ptca    . Vasectomy    . Orif tibia & fibula fractures    . Angioplasty  1991  . Eye surgery  2010    cataract - right  . Cholecystectomy  2012  . Cardiac catheterization  2005    Moderate LCx and LAD disease and severe diffuse RCA disease  . Cardiac catheterization  01/2012    normal left main, 50% pLAD stenosis, dLAD mild luminal irregularities, D1 30-40% stenosis at take off; subtotally occluded/severely diseased OM1, mid 80% stenosis in small OM2, long prox and mid RCA stenoses ranging from 60-95%, severely diseased PDA, distal PLSA occluded filling via L-R collateral; normal LV function; inferior lateral basal akinesis.  . Left heart  catheterization with coronary angiogram N/A 01/28/2012    Procedure: LEFT HEART CATHETERIZATION WITH CORONARY ANGIOGRAM;  Surgeon: Iran Ouch, MD;  Location: [facility] CATH LAB;  Service: Cardiovascular;  Laterality: N/A;  . Left heart catheterization with coronary angiogram N/A 03/17/2014    Procedure: LEFT HEART CATHETERIZATION WITH CORONARY ANGIOGRAM;  Surgeon: Kathleene Hazel, MD;  Location: [facility] CATH LAB;  Service: Cardiovascular;  Laterality: N/A;    Family History  Problem Relation Age of Onset  . Asthma Mother   . Allergies Sister   . Coronary artery disease Other     Allergies  Allergen Reactions  . Meloxicam Other (See Comments)    Pt feels anxiety attacks when on this medicine    Current Outpatient Prescriptions on File Prior to Visit  Medication Sig Dispense Refill  . albuterol (PROVENTIL HFA;VENTOLIN HFA) 108 (90 BASE) MCG/ACT inhaler Inhale 2 puffs into the lungs every 4 (four) hours as needed. For shortness of breath    . aspirin EC 81 MG tablet Take 81 mg by mouth daily.    . fluticasone (FLONASE) 50 MCG/ACT nasal spray Place 2 sprays into both nostrils daily as needed for allergies. 16 g 5  . gabapentin (NEURONTIN) 300  MG capsule Take 900 mg by mouth 2 (two) times daily.     Marland Kitchen glucose blood (PRECISION XTRA TEST STRIPS) test strip USE TO CHECK BLOOD SUGAR DAILY 100 each 3  . HYDROcodone-homatropine (HYCODAN) 5-1.5 MG/5ML syrup Take 5 mLs by mouth every 6 (six) hours as needed for cough. 120 mL 0  . hyoscyamine (LEVSIN SL) 0.125 MG SL tablet Place 1 tablet (0.125 mg total) under the tongue every 4 (four) hours as needed. 90 tablet 5  . isosorbide mononitrate (IMDUR) 30 MG 24 hr tablet TAKE ONE TABLET BY MOUTH TWICE DAILY. 180 tablet 3  . metFORMIN (GLUCOPHAGE) 500 MG tablet Take 500 mg by mouth 2 (two) times daily with a meal.    . metoprolol (LOPRESSOR) 50 MG tablet Take 1 tablet (50 mg total) by mouth 2 (two) times daily. 180 tablet 3  . Multiple Vitamin  (MULTIVITAMIN) tablet Take 1 tablet by mouth daily.      . nitroGLYCERIN (NITROSTAT) 0.4 MG SL tablet Place 1 tablet (0.4 mg total) under the tongue every 5 (five) minutes as needed. Chest pain 25 tablet 3  . nortriptyline (PAMELOR) 25 MG capsule TAKE ONE CAPSULE BY MOUTH AT BEDTIME 90 capsule 0  . pantoprazole (PROTONIX) 40 MG tablet Take 40 mg by mouth daily. Take one half hour before eating.     . ramipril (ALTACE) 5 MG capsule TAKE ONE CAPSULE BY MOUTH ONCE DAILY 90 capsule 1  . simvastatin (ZOCOR) 80 MG tablet Take 40 mg by mouth at bedtime. Rx from [place]    . tamsulosin (FLOMAX) 0.4 MG CAPS capsule TAKE ONE CAPSULE BY MOUTH DAILY 30 capsule 5  . levothyroxine (SYNTHROID, LEVOTHROID) 25 MCG tablet Take 25 mcg by mouth daily. Pt getting Rx from [place]     No current facility-administered medications on file prior to visit.    BP 130/80 mmHg  Pulse 76  Temp(Src) 98.1 F (36.7 C) (Oral)  Resp 20  Ht  (1.778 m)  Wt 227 lb (102.967 kg)  BMI 32.57 kg/m2      Review of Systems  Constitutional: Negative for fever, chills, appetite change and fatigue.  HENT: Negative for congestion, dental problem, ear pain, hearing loss, sore throat, tinnitus, trouble swallowing and voice change.   Eyes: Negative for pain, discharge and visual disturbance.  Respiratory: Negative for cough, chest tightness, wheezing and stridor.   Cardiovascular: Negative for chest pain, palpitations and leg swelling.  Gastrointestinal: Negative for nausea, vomiting, abdominal pain, diarrhea, constipation, blood in stool and abdominal distention.  Genitourinary: Negative for urgency, hematuria, flank pain, discharge, difficulty urinating and genital sores.  Musculoskeletal: Negative for myalgias, back pain, joint swelling, arthralgias, gait problem and neck stiffness.  Skin: Positive for color change and rash.  Neurological: Negative for dizziness, syncope, speech difficulty, weakness, numbness and headaches.    Hematological: Negative for adenopathy. Does not bruise/bleed easily.  Psychiatric/Behavioral: Negative for behavioral problems and dysphoric mood. The patient is not nervous/anxious.        Objective:   Physical Exam  Constitutional: He appears well-developed and well-nourished. No distress.  Skin:  Fading ecchymosis to the left deltoid area          Assessment & Plan:   Ecchymosis left deltoid area.  Patient understands this was not a local allergic reaction.  Will continue Tylenol and warm compresses

## 2015-06-01 ENCOUNTER — Ambulatory Visit (INDEPENDENT_AMBULATORY_CARE_PROVIDER_SITE_OTHER): Payer: Medicare Other | Admitting: Internal Medicine

## 2015-06-01 ENCOUNTER — Encounter: Payer: Self-pay | Admitting: Internal Medicine

## 2015-06-01 VITALS — BP 124/70 | HR 89 | Temp 97.7°F | Resp 20 | Ht 70.0 in | Wt 224.0 lb

## 2015-06-01 DIAGNOSIS — E785 Hyperlipidemia, unspecified: Secondary | ICD-10-CM | POA: Diagnosis not present

## 2015-06-01 DIAGNOSIS — N183 Chronic kidney disease, stage 3 unspecified: Secondary | ICD-10-CM

## 2015-06-01 DIAGNOSIS — I10 Essential (primary) hypertension: Secondary | ICD-10-CM | POA: Diagnosis not present

## 2015-06-01 DIAGNOSIS — E039 Hypothyroidism, unspecified: Secondary | ICD-10-CM

## 2015-06-01 DIAGNOSIS — I2510 Atherosclerotic heart disease of native coronary artery without angina pectoris: Secondary | ICD-10-CM

## 2015-06-01 DIAGNOSIS — E1142 Type 2 diabetes mellitus with diabetic polyneuropathy: Secondary | ICD-10-CM | POA: Diagnosis not present

## 2015-06-01 LAB — HEMOGLOBIN A1C: Hgb A1c MFr Bld: 7.1 % — ABNORMAL HIGH (ref 4.6–6.5)

## 2015-06-01 MED ORDER — ZOLPIDEM TARTRATE 5 MG PO TABS: 5.0000 mg | ORAL_TABLET | Freq: Every evening | ORAL | Status: DC | PRN

## 2015-06-01 NOTE — Progress Notes (Signed)
Pre visit review using our clinic review tool, if applicable. No additional management support is needed unless otherwise documented below in the visit note. 

## 2015-06-01 NOTE — Patient Instructions (Addendum)
Trial melatonin 1 mg at bedtime  Insomnia Insomnia is a sleep disorder that makes it difficult to fall asleep or to stay asleep. Insomnia can cause tiredness (fatigue), low energy, difficulty concentrating, mood swings, and poor performance at work or school.  There are three different ways to classify insomnia:  Difficulty falling asleep.  Difficulty staying asleep.  Waking up too early in the morning. Any type of insomnia can be long-term (chronic) or short-term (acute). Both are common. Short-term insomnia usually lasts for three months or less. Chronic insomnia occurs at least three times a week for longer than three months. CAUSES  Insomnia may be caused by another condition, situation, or substance, such as:  Anxiety.  Certain medicines.  Gastroesophageal reflux disease (GERD) or other gastrointestinal conditions.  Asthma or other breathing conditions.  Restless legs syndrome, sleep apnea, or other sleep disorders.  Chronic pain.  Menopause. This may include hot flashes.  Stroke.  Abuse of alcohol, tobacco, or illegal drugs.  Depression.  Caffeine.   Neurological disorders, such as Alzheimer disease.  An overactive thyroid (hyperthyroidism). The cause of insomnia may not be known. RISK FACTORS Risk factors for insomnia include:  Gender. Women are more commonly affected than men.  Age. Insomnia is more common as you get older.  Stress. This may involve your professional or personal life.  Income. Insomnia is more common in people with lower income.  Lack of exercise.   Irregular work schedule or night shifts.  Traveling between different time zones. SIGNS AND SYMPTOMS If you have insomnia, trouble falling asleep or trouble staying asleep is the main symptom. This may lead to other symptoms, such as:  Feeling fatigued.  Feeling nervous about going to sleep.  Not feeling rested in the morning.  Having trouble concentrating.  Feeling irritable,  anxious, or depressed. TREATMENT  Treatment for insomnia depends on the cause. If your insomnia is caused by an underlying condition, treatment will focus on addressing the condition. Treatment may also include:   Medicines to help you sleep.  Counseling or therapy.  Lifestyle adjustments. HOME CARE INSTRUCTIONS   Take medicines only as directed by your health care provider.  Keep regular sleeping and waking hours. Avoid naps.  Keep a sleep diary to help you and your health care provider figure out what could be causing your insomnia. Include:   When you sleep.  When you wake up during the night.  How well you sleep.   How rested you feel the next day.  Any side effects of medicines you are taking.  What you eat and drink.   Make your bedroom a comfortable place where it is easy to fall asleep:  Put up shades or special blackout curtains to block light from outside.  Use a white noise machine to block noise.  Keep the temperature cool.   Exercise regularly as directed by your health care provider. Avoid exercising right before bedtime.  Use relaxation techniques to manage stress. Ask your health care provider to suggest some techniques that may work well for you. These may include:  Breathing exercises.  Routines to release muscle tension.  Visualizing peaceful scenes.  Cut back on alcohol, caffeinated beverages, and cigarettes, especially close to bedtime. These can disrupt your sleep.  Do not overeat or eat spicy foods right before bedtime. This can lead to digestive discomfort that can make it hard for you to sleep.  Limit screen use before bedtime. This includes:  Watching TV.  Using your smartphone, tablet, and  computer.  Stick to a routine. This can help you fall asleep faster. Try to do a quiet activity, brush your teeth, and go to bed at the same time each night.  Get out of bed if you are still awake after 15 minutes of trying to sleep. Keep the  lights down, but try reading or doing a quiet activity. When you feel sleepy, go back to bed.  Make sure that you drive carefully. Avoid driving if you feel very sleepy.  Keep all follow-up appointments as directed by your health care provider. This is important. SEEK MEDICAL CARE IF:   You are tired throughout the day or have trouble in your daily routine due to sleepiness.  You continue to have sleep problems or your sleep problems get worse. SEEK IMMEDIATE MEDICAL CARE IF:   You have serious thoughts about hurting yourself or someone else.   This information is not intended to replace advice given to you by your health care provider. Make sure you discuss any questions you have with your health care provider.   Document Released: 05/23/2000 Document Revised: 02/14/2015 Document Reviewed: 02/24/2014 Elsevier Interactive Patient Education 2016 [employer]   Limit your sodium (Salt) intake   Please check your hemoglobin A1c every 3 months

## 2015-06-01 NOTE — Progress Notes (Signed)
Subjective:    Patient ID: Richard Ho, male    DOB: 12-Dec-1940, 74 y.o.   MRN: [id number]  HPI  Lab Results  Component Value Date   HGBA1C 6.9* 02/26/2015    Wt Readings from Last 3 Encounters:  06/01/15 224 lb (101.606 kg)  03/13/15 227 lb (102.967 kg)  03/05/15 226 lb (102.68 kg)   74 year old patient who is seen today for his quarterly follow-up.  He has type 2 diabetes, kidney by peripheral neuropathy and chronic kidney disease.  He has CAD, which has been stable.  He has dyslipidemia and treated hypertension. He has done well except for some insomnia.  This has been an issue for the past 2 months.  The main issue is waking up through the night.  Past Medical History  Diagnosis Date  . Asthma   . Coronary artery disease 1991    a) Angioplasty LAD 1991 [place] b) MI in 1995 at  [facility] in [place], med Rx  . GERD (gastroesophageal reflux disease)   . Hyperlipidemia   . Myocardial infarct (HCC)   . Nephrolithiasis   . Osteopenia   . HTN (hypertension)   . Diabetes mellitus   . Hearing loss     Social History   Social History  . Marital Status: Married    Spouse Name: N/A  . Number of Children: N/A  . Years of Education: N/A   Occupational History  . Retired    Social History Main Topics  . Smoking status: Never Smoker   . Smokeless tobacco: Never Used  . Alcohol Use: 0.0 oz/week    0 Standard drinks or equivalent per week     Comment: occ, once every 2 months  . Drug Use: No  . Sexual Activity: Not on file   Other Topics Concern  . Not on file   Social History Narrative    Past Surgical History  Procedure Laterality Date  . Inguinal hernia repair    . Ptca    . Vasectomy    . Orif tibia & fibula fractures    . Angioplasty  1991  . Eye surgery  2010    cataract - right  . Cholecystectomy  2012  . Cardiac catheterization  2005    Moderate LCx and LAD disease and severe diffuse RCA disease  . Cardiac catheterization  01/2012   normal left main, 50% pLAD stenosis, dLAD mild luminal irregularities, D1 30-40% stenosis at take off; subtotally occluded/severely diseased OM1, mid 80% stenosis in small OM2, long prox and mid RCA stenoses ranging from 60-95%, severely diseased PDA, distal PLSA occluded filling via L-R collateral; normal LV function; inferior lateral basal akinesis.  . Left heart catheterization with coronary angiogram N/A 01/28/2012    Procedure: LEFT HEART CATHETERIZATION WITH CORONARY ANGIOGRAM;  Surgeon: Iran Ouch, MD;  Location: [facility] CATH LAB;  Service: Cardiovascular;  Laterality: N/A;  . Left heart catheterization with coronary angiogram N/A 03/17/2014    Procedure: LEFT HEART CATHETERIZATION WITH CORONARY ANGIOGRAM;  Surgeon: Kathleene Hazel, MD;  Location: [facility] CATH LAB;  Service: Cardiovascular;  Laterality: N/A;    Family History  Problem Relation Age of Onset  . Asthma Mother   . Allergies Sister   . Coronary artery disease Other     Allergies  Allergen Reactions  . Meloxicam Other (See Comments)    Pt feels anxiety attacks when on this medicine    Current Outpatient Prescriptions on File Prior to Visit  Medication Sig  Dispense Refill  . albuterol (PROVENTIL HFA;VENTOLIN HFA) 108 (90 BASE) MCG/ACT inhaler Inhale 2 puffs into the lungs every 4 (four) hours as needed. For shortness of breath    . aspirin EC 81 MG tablet Take 81 mg by mouth daily.    . fluticasone (FLONASE) 50 MCG/ACT nasal spray Place 2 sprays into both nostrils daily as needed for allergies. 16 g 5  . gabapentin (NEURONTIN) 300 MG capsule Take 900 mg by mouth 2 (two) times daily.     Marland Kitchen glucose blood (PRECISION XTRA TEST STRIPS) test strip USE TO CHECK BLOOD SUGAR DAILY 100 each 3  . HYDROcodone-homatropine (HYCODAN) 5-1.5 MG/5ML syrup Take 5 mLs by mouth every 6 (six) hours as needed for cough. 120 mL 0  . hyoscyamine (LEVSIN SL) 0.125 MG SL tablet Place 1 tablet (0.125 mg total) under the tongue every 4 (four) hours  as needed. 90 tablet 5  . isosorbide mononitrate (IMDUR) 30 MG 24 hr tablet TAKE ONE TABLET BY MOUTH TWICE DAILY. 180 tablet 3  . metFORMIN (GLUCOPHAGE) 500 MG tablet Take 500 mg by mouth 2 (two) times daily with a meal.    . metoprolol (LOPRESSOR) 50 MG tablet Take 1 tablet (50 mg total) by mouth 2 (two) times daily. 180 tablet 3  . Multiple Vitamin (MULTIVITAMIN) tablet Take 1 tablet by mouth daily.      . nitroGLYCERIN (NITROSTAT) 0.4 MG SL tablet Place 1 tablet (0.4 mg total) under the tongue every 5 (five) minutes as needed. Chest pain 25 tablet 3  . nortriptyline (PAMELOR) 25 MG capsule TAKE ONE CAPSULE BY MOUTH AT BEDTIME 90 capsule 0  . pantoprazole (PROTONIX) 40 MG tablet Take 40 mg by mouth daily. Take one half hour before eating.     . ramipril (ALTACE) 5 MG capsule TAKE ONE CAPSULE BY MOUTH ONCE DAILY 90 capsule 1  . simvastatin (ZOCOR) 80 MG tablet Take 40 mg by mouth at bedtime. Rx from [place]    . tamsulosin (FLOMAX) 0.4 MG CAPS capsule TAKE ONE CAPSULE BY MOUTH DAILY 30 capsule 5  . levothyroxine (SYNTHROID, LEVOTHROID) 25 MCG tablet Take 25 mcg by mouth daily. Pt getting Rx from [place]     No current facility-administered medications on file prior to visit.    BP 124/70 mmHg  Pulse 89  Temp(Src) 97.7 F (36.5 C) (Oral)  Resp 20  Ht  (1.778 m)  Wt 224 lb (101.606 kg)  BMI 32.14 kg/m2  SpO2 97%     Review of Systems  Constitutional: Negative for fever, chills, appetite change and fatigue.  HENT: Negative for congestion, dental problem, ear pain, hearing loss, sore throat, tinnitus, trouble swallowing and voice change.   Eyes: Negative for pain, discharge and visual disturbance.  Respiratory: Negative for cough, chest tightness, wheezing and stridor.   Cardiovascular: Negative for chest pain, palpitations and leg swelling.  Gastrointestinal: Negative for nausea, vomiting, abdominal pain, diarrhea, constipation, blood in stool and abdominal distention.  Genitourinary:  Negative for urgency, hematuria, flank pain, discharge, difficulty urinating and genital sores.  Musculoskeletal: Negative for myalgias, back pain, joint swelling, arthralgias, gait problem and neck stiffness.  Skin: Negative for rash.  Neurological: Negative for dizziness, syncope, speech difficulty, weakness, numbness and headaches.  Hematological: Negative for adenopathy. Does not bruise/bleed easily.  Psychiatric/Behavioral: Positive for sleep disturbance. Negative for behavioral problems and dysphoric mood. The patient is not nervous/anxious.        Objective:   Physical Exam  Constitutional: He is oriented  to person, place, and time. He appears well-developed.  HENT:  Head: Normocephalic.  Right Ear: External ear normal.  Left Ear: External ear normal.  Eyes: Conjunctivae and EOM are normal.  Neck: Normal range of motion.  Cardiovascular: Normal rate and normal heart sounds.   Pulmonary/Chest: Breath sounds normal.  Abdominal: Bowel sounds are normal.  Musculoskeletal: Normal range of motion. He exhibits no edema or tenderness.  Neurological: He is alert and oriented to person, place, and time.  Psychiatric: He has a normal mood and affect. His behavior is normal.          Assessment & Plan:   Diabetes mellitus.  Will check a hemoglobin A1c Peripheral neuropathy Insomnia.  Sleep hygiene issues discussed.  Will give a trial of melatonin.  As a last resort.  Will try Ambien   Essential hypertension Dyslipidemia Coronary artery disease

## 2015-07-16 LAB — LIPID PANEL
CHOLESTEROL: 152 mg/dL (ref 0–200)
HDL: 45 mg/dL (ref 35–70)
LDL CALC: 80 mg/dL
Triglycerides: 137 mg/dL (ref 40–160)

## 2015-07-16 LAB — MICROALBUMIN, URINE: Microalb, Ur: 1.09

## 2015-07-16 LAB — HEMOGLOBIN A1C: Hemoglobin A1C: 7.2

## 2015-07-16 LAB — BASIC METABOLIC PANEL
BUN: 26 mg/dL — AB (ref 4–21)
Creatinine: 1.5 mg/dL — AB (ref 0.6–1.3)
GLUCOSE: 110 mg/dL
Potassium: 4.9 mmol/L (ref 3.4–5.3)
SODIUM: 138 mmol/L (ref 137–147)

## 2015-07-30 ENCOUNTER — Encounter: Payer: Self-pay | Admitting: Internal Medicine

## 2015-07-31 ENCOUNTER — Other Ambulatory Visit: Payer: Self-pay | Admitting: Internal Medicine

## 2015-07-31 MED ORDER — RAMIPRIL 5 MG PO CAPS: 5.0000 mg | ORAL_CAPSULE | Freq: Every day | ORAL | Status: DC

## 2015-08-31 ENCOUNTER — Encounter: Payer: Self-pay | Admitting: Internal Medicine

## 2015-08-31 ENCOUNTER — Ambulatory Visit (INDEPENDENT_AMBULATORY_CARE_PROVIDER_SITE_OTHER): Payer: Medicare Other | Admitting: Internal Medicine

## 2015-08-31 VITALS — BP 130/80 | HR 100 | Temp 97.6°F | Resp 20 | Ht 70.0 in | Wt 225.0 lb

## 2015-08-31 DIAGNOSIS — E785 Hyperlipidemia, unspecified: Secondary | ICD-10-CM | POA: Diagnosis not present

## 2015-08-31 DIAGNOSIS — N183 Chronic kidney disease, stage 3 unspecified: Secondary | ICD-10-CM

## 2015-08-31 DIAGNOSIS — E1142 Type 2 diabetes mellitus with diabetic polyneuropathy: Secondary | ICD-10-CM

## 2015-08-31 DIAGNOSIS — E1121 Type 2 diabetes mellitus with diabetic nephropathy: Secondary | ICD-10-CM | POA: Diagnosis not present

## 2015-08-31 DIAGNOSIS — K219 Gastro-esophageal reflux disease without esophagitis: Secondary | ICD-10-CM

## 2015-08-31 DIAGNOSIS — I10 Essential (primary) hypertension: Secondary | ICD-10-CM

## 2015-08-31 MED ORDER — TRAZODONE HCL 50 MG PO TABS: 25.0000 mg | ORAL_TABLET | Freq: Every evening | ORAL | Status: DC | PRN

## 2015-08-31 NOTE — Progress Notes (Signed)
Subjective:    Patient ID: Richard Ho, male    DOB: February 12, 1941, 75 y.o.   MRN: [id number]  HPI  Lab Results  Component Value Date   HGBA1C 7.1* 06/01/2015   75 year old patient who is seen today for follow-up.  He has a history of type 2 diabetes.  [facility] is [facility].  He states that he has started physical therapy.  Due to right leg pain and a giving away sensation.  Evaluation has included a head CT. He has moderate chronic kidney disease and has also been seen by nephrology.  Chronic PPI therapy has been discontinued and he has done well on when necessary H2 blocker.  He has been advised to avoid all anti-inflammatory medications He is otherwise doing well He is pleased with new hearing aids and is also receiving dental care at the [facility].  Past Medical History  Diagnosis Date  . Asthma   . Coronary artery disease 1991    a) Angioplasty LAD 1991 [place] b) MI in 1995 at  [facility] in [place], med Rx  . GERD (gastroesophageal reflux disease)   . Hyperlipidemia   . Myocardial infarct (HCC)   . Nephrolithiasis   . Osteopenia   . HTN (hypertension)   . Diabetes mellitus   . Hearing loss     Social History   Social History  . Marital Status: Married    Spouse Name: N/A  . Number of Children: N/A  . Years of Education: N/A   Occupational History  . Retired    Social History Main Topics  . Smoking status: Never Smoker   . Smokeless tobacco: Never Used  . Alcohol Use: 0.0 oz/week    0 Standard drinks or equivalent per week     Comment: occ, once every 2 months  . Drug Use: No  . Sexual Activity: Not on file   Other Topics Concern  . Not on file   Social History Narrative    Past Surgical History  Procedure Laterality Date  . Inguinal hernia repair    . Ptca    . Vasectomy    . Orif tibia & fibula fractures    . Angioplasty  1991  . Eye surgery  2010    cataract - right  . Cholecystectomy  2012  . Cardiac  catheterization  2005    Moderate LCx and LAD disease and severe diffuse RCA disease  . Cardiac catheterization  01/2012    normal left main, 50% pLAD stenosis, dLAD mild luminal irregularities, D1 30-40% stenosis at take off; subtotally occluded/severely diseased OM1, mid 80% stenosis in small OM2, long prox and mid RCA stenoses ranging from 60-95%, severely diseased PDA, distal PLSA occluded filling via L-R collateral; normal LV function; inferior lateral basal akinesis.  . Left heart catheterization with coronary angiogram N/A 01/28/2012    Procedure: LEFT HEART CATHETERIZATION WITH CORONARY ANGIOGRAM;  Surgeon: Iran OuchMuhammad A Arida, MD;  Location: [facility] CATH LAB;  Service: Cardiovascular;  Laterality: N/A;  . Left heart catheterization with coronary angiogram N/A 03/17/2014    Procedure: LEFT HEART CATHETERIZATION WITH CORONARY ANGIOGRAM;  Surgeon: Kathleene Hazelhristopher D McAlhany, MD;  [facility];  Service: Cardiovascular;  Laterality: N/A;    Family History  Problem Relation Age of Onset  . Asthma Mother   . Allergies Sister   . Coronary artery disease Other     Allergies  Allergen Reactions  . Meloxicam Other (See Comments)  Pt feels anxiety attacks when on this medicine    Current Outpatient Prescriptions on File Prior to Visit  Medication Sig Dispense Refill  . albuterol (PROVENTIL HFA;VENTOLIN HFA) 108 (90 BASE) MCG/ACT inhaler Inhale 2 puffs into the lungs every 4 (four) hours as needed. For shortness of breath    . aspirin EC 81 MG tablet Take 81 mg by mouth daily.    . fluticasone (FLONASE) 50 MCG/ACT nasal spray Place 2 sprays into both nostrils daily as needed for allergies. 16 g 5  . gabapentin (NEURONTIN) 300 MG capsule Take 900 mg by mouth 3 (three) times daily.     Marland Kitchen glucose blood (PRECISION XTRA TEST STRIPS) test strip USE TO CHECK BLOOD SUGAR DAILY 100 each 3  . HYDROcodone-homatropine (HYCODAN) 5-1.5 MG/5ML syrup Take 5 mLs by mouth every 6 (six) hours as needed for  cough. 120 mL 0  . hyoscyamine (LEVSIN SL) 0.125 MG SL tablet Place 1 tablet (0.125 mg total) under the tongue every 4 (four) hours as needed. 90 tablet 5  . isosorbide mononitrate (IMDUR) 30 MG 24 hr tablet TAKE ONE TABLET BY MOUTH TWICE DAILY. 180 tablet 3  . metFORMIN (GLUCOPHAGE) 500 MG tablet Take 500 mg by mouth 2 (two) times daily with a meal.    . metoprolol (LOPRESSOR) 50 MG tablet Take 1 tablet (50 mg total) by mouth 2 (two) times daily. 180 tablet 3  . nitroGLYCERIN (NITROSTAT) 0.4 MG SL tablet Place 1 tablet (0.4 mg total) under the tongue every 5 (five) minutes as needed. Chest pain 25 tablet 3  . nortriptyline (PAMELOR) 25 MG capsule TAKE ONE CAPSULE BY MOUTH AT BEDTIME 90 capsule 0  . ramipril (ALTACE) 5 MG capsule Take 1 capsule (5 mg total) by mouth daily. 90 capsule 1  . simvastatin (ZOCOR) 80 MG tablet Take 40 mg by mouth at bedtime. Rx from [place]    . tamsulosin (FLOMAX) 0.4 MG CAPS capsule TAKE ONE CAPSULE BY MOUTH DAILY 30 capsule 5  . levothyroxine (SYNTHROID, LEVOTHROID) 25 MCG tablet Take 25 mcg by mouth daily. Pt getting Rx from [place]     No current facility-administered medications on file prior to visit.    BP 130/80 mmHg  Pulse 100  Temp(Src) 97.6 F (36.4 C) (Oral)  Resp 20  Ht  (1.778 m)  Wt 225 lb (102.059 kg)  BMI 32.28 kg/m2  SpO2 98%    Review of Systems  Constitutional: Negative for fever, chills, appetite change and fatigue.  HENT: Negative for congestion, dental problem, ear pain, hearing loss, sore throat, tinnitus, trouble swallowing and voice change.   Eyes: Negative for pain, discharge and visual disturbance.  Respiratory: Negative for cough, chest tightness, wheezing and stridor.   Cardiovascular: Negative for chest pain, palpitations and leg swelling.  Gastrointestinal: Negative for nausea, vomiting, abdominal pain, diarrhea, constipation, blood in stool and abdominal distention.  Genitourinary: Negative for urgency, hematuria, flank pain,  discharge, difficulty urinating and genital sores.  Musculoskeletal: Positive for gait problem. Negative for myalgias, back pain, joint swelling, arthralgias and neck stiffness.  Skin: Negative for rash.  Neurological: Negative for dizziness, syncope, speech difficulty, weakness, numbness and headaches.  Hematological: Negative for adenopathy. Does not bruise/bleed easily.  Psychiatric/Behavioral: Negative for behavioral problems and dysphoric mood. The patient is not nervous/anxious.        Objective:   Physical Exam  Constitutional: He is oriented to person, place, and time. He appears well-developed.  Weight 225 Blood pressure 126/80  HENT:  Head: Normocephalic.  Right Ear: External ear normal.  Left Ear: External ear normal.  Eyes: Conjunctivae and EOM are normal.  Neck: Normal range of motion.  Cardiovascular: Normal rate and normal heart sounds.   Pulmonary/Chest: Breath sounds normal.  Abdominal: Bowel sounds are normal.  Musculoskeletal: Normal range of motion. He exhibits no edema or tenderness.  Neurological: He is alert and oriented to person, place, and time.  Psychiatric: He has a normal mood and affect. His behavior is normal.          Assessment & Plan:   Diabetes mellitus.  Recent hemoglobin A1c at the [place] 7.2.  No change in therapy.  Nonpharmacologic measures discussed.  Will reassess in 3 months Hypertension, stable.  Obesity.  Weight loss encouraged Episodic insomnia.  Patient wishes to try medication other than Ambien.  Will give a trial of trazodone.  We'll continue melatonin Chronic kidney disease.  We'll continue to monitor.  Continue to avoid all anti-inflammatory medications.  PPI therapy discontinued  Recheck 3 months Continue PT

## 2015-08-31 NOTE — Patient Instructions (Signed)
Limit your sodium (Salt) intake    It is important that you exercise regularly, at least 20 minutes 3 to 4 times per week.  If you develop chest pain or shortness of breath seek  medical attention.  You need to lose weight.  Consider a lower calorie diet and regular exercise. 

## 2015-09-06 ENCOUNTER — Encounter: Payer: Self-pay | Admitting: Internal Medicine

## 2015-09-14 ENCOUNTER — Ambulatory Visit (INDEPENDENT_AMBULATORY_CARE_PROVIDER_SITE_OTHER): Payer: Medicare Other | Admitting: Cardiovascular Disease

## 2015-09-14 ENCOUNTER — Encounter: Payer: Self-pay | Admitting: Cardiovascular Disease

## 2015-09-14 VITALS — BP 134/70 | HR 82 | Ht 70.0 in | Wt 218.8 lb

## 2015-09-14 DIAGNOSIS — I10 Essential (primary) hypertension: Secondary | ICD-10-CM | POA: Diagnosis not present

## 2015-09-14 DIAGNOSIS — E785 Hyperlipidemia, unspecified: Secondary | ICD-10-CM | POA: Diagnosis not present

## 2015-09-14 DIAGNOSIS — I493 Ventricular premature depolarization: Secondary | ICD-10-CM | POA: Diagnosis not present

## 2015-09-14 DIAGNOSIS — I2510 Atherosclerotic heart disease of native coronary artery without angina pectoris: Secondary | ICD-10-CM | POA: Diagnosis not present

## 2015-09-14 NOTE — Progress Notes (Signed)
Chief Complaint  Patient presents with  . Leg Pain     History of Present Illness: 75 yo male with history of CAD, HTN, HLD, DM here today for cardiac follow up. He has a history of angioplasty in [place] and in [place] in the 1990s. Cardiac cath in 2005 showed severe, diffuse RCA system disease. He was admitted in August 2013 with exertional chest pain and dyspnea. He had a left heart cath showing significant disease in small OMs as well as severe diffuse RCA system disease, similar to the prior cath. He was managed medically as there were not good interventional targets in the RCA system. He was started on Imdur 30 mg daily. Stress myoview 01/23/14 which showed possible inferior ischemia, LVEF=48%. Cardiac cath 03/17/14 with stable moderate CAD.   He is here today for follow up. He is feeling well. No chest pain or SOB. He is mostly limited by his leg pain which is felt to be from neuropathy.   Primary Care Physician: Amador CunasKwiatkowski  Past Medical History  Diagnosis Date  . Asthma   . Coronary artery disease 1991    a) Angioplasty LAD 1991 [place] b) MI in 1995 at  [facility] in [place], med Rx  . GERD (gastroesophageal reflux disease)   . Hyperlipidemia   . Myocardial infarct (HCC)   . Nephrolithiasis   . Osteopenia   . HTN (hypertension)   . Diabetes mellitus   . Hearing loss     Past Surgical History  Procedure Laterality Date  . Inguinal hernia repair    . Ptca    . Vasectomy    . Orif tibia & fibula fractures    . Angioplasty  1991  . Eye surgery  2010    cataract - right  . Cholecystectomy  2012  . Cardiac catheterization  2005    Moderate LCx and LAD disease and severe diffuse RCA disease  . Cardiac catheterization  01/2012    normal left main, 50% pLAD stenosis, dLAD mild luminal irregularities, D1 30-40% stenosis at take off; subtotally occluded/severely diseased OM1, mid 80% stenosis in small OM2, long prox and mid RCA stenoses ranging from 60-95%, severely  diseased PDA, distal PLSA occluded filling via L-R collateral; normal LV function; inferior lateral basal akinesis.  . Left heart catheterization with coronary angiogram N/A 01/28/2012    Procedure: LEFT HEART CATHETERIZATION WITH CORONARY ANGIOGRAM;  Surgeon: Iran OuchMuhammad A Arida, MD;  Location: [facility] C[facility];  Service: Cardiovascular;  Laterality: N/A;  . Left heart catheterization with coronary angiogram N/A 03/17/2014    Procedure: LEFT HEART CATHETERIZATION WITH CORONARY ANGIOGRAM;  Surgeon: Kathleene Hazelhristopher D Nickie Deren, MD;  [facility];  Service: Cardiovascular;  Laterality: N/A;    Current Outpatient Prescriptions  Medication Sig Dispense Refill  . albuterol (PROVENTIL HFA;VENTOLIN HFA) 108 (90 BASE) MCG/ACT inhaler Inhale 2 puffs into the lungs every 4 (four) hours as needed. For shortness of breath    . aspirin EC 81 MG tablet Take 81 mg by mouth daily.    Marland Kitchen. b complex vitamins capsule Take 1 capsule by mouth daily.    . fluticasone (FLONASE) 50 MCG/ACT nasal spray Place 2 sprays into both nostrils daily as needed for allergies. 16 g 5  . gabapentin (NEURONTIN) 300 MG capsule Take 900 mg by mouth 3 (three) times daily.     Marland Kitchen. glucose blood (PRECISION XTRA TEST STRIPS) test strip USE TO CHECK BLOOD SUGAR DAILY 100 each 3  . HYDROcodone-homatropine (HYCODAN) 5-1.5 MG/5ML  syrup Take 5 mLs by mouth every 6 (six) hours as needed for cough. 120 mL 0  . hyoscyamine (LEVSIN SL) 0.125 MG SL tablet Place 1 tablet (0.125 mg total) under the tongue every 4 (four) hours as needed. 90 tablet 5  . isosorbide mononitrate (IMDUR) 30 MG 24 hr tablet TAKE ONE TABLET BY MOUTH TWICE DAILY. 180 tablet 3  . levothyroxine (SYNTHROID, LEVOTHROID) 25 MCG tablet Take 25 mcg by mouth daily. Pt getting Rx from [place]    . Melatonin 3 MG CAPS Take 1 capsule by mouth at bedtime.    . metFORMIN (GLUCOPHAGE) 500 MG tablet Take 500 mg by mouth 2 (two) times daily with a meal.    . metoprolol (LOPRESSOR) 50 MG tablet Take 1  tablet (50 mg total) by mouth 2 (two) times daily. 180 tablet 3  . nitroGLYCERIN (NITROSTAT) 0.4 MG SL tablet Place 1 tablet (0.4 mg total) under the tongue every 5 (five) minutes as needed. Chest pain 25 tablet 3  . nortriptyline (PAMELOR) 25 MG capsule TAKE ONE CAPSULE BY MOUTH AT BEDTIME 90 capsule 0  . ramipril (ALTACE) 5 MG capsule Take 1 capsule (5 mg total) by mouth daily. 90 capsule 1  . simvastatin (ZOCOR) 80 MG tablet Take 40 mg by mouth at bedtime. Rx from [place]    . tamsulosin (FLOMAX) 0.4 MG CAPS capsule TAKE ONE CAPSULE BY MOUTH DAILY 30 capsule 5  . traZODone (DESYREL) 50 MG tablet Take 0.5-1 tablets (25-50 mg total) by mouth at bedtime as needed for sleep. 30 tablet 3   No current facility-administered medications for this visit.    Allergies  Allergen Reactions  . Meloxicam Other (See Comments)    "jumping out of my skin" Pt feels anxiety attacks when on this medicine    Social History   Social History  . Marital Status: Married    Spouse Name: N/A  . Number of Children: N/A  . Years of Education: N/A   Occupational History  . Retired    Social History Main Topics  . Smoking status: Never Smoker   . Smokeless tobacco: Never Used  . Alcohol Use: 0.0 oz/week    0 Standard drinks or equivalent per week     Comment: occ, once every 2 months  . Drug Use: No  . Sexual Activity: Not on file   Other Topics Concern  . Not on file   Social History Narrative    Family History  Problem Relation Age of Onset  . Asthma Mother   . Allergies Sister   . Coronary artery disease Other     Review of Systems:  As stated in the HPI and otherwise negative.   BP 134/70 mmHg  Pulse 82  Ht  (1.778 m)  Wt 218 lb 12.8 oz (99.247 kg)  BMI 31.39 kg/m2  Physical Examination: General: Well developed, well nourished, NAD HEENT: OP clear, mucus membranes moist SKIN: warm, dry. No rashes. Neuro: No focal deficits Musculoskeletal: Muscle strength 5/5 all  ext Psychiatric: Mood and affect normal Neck: No JVD, no carotid bruits, no thyromegaly, no lymphadenopathy. Lungs:Clear bilaterally, no wheezes, rhonci, crackles Cardiovascular: Regular rate and rhythm. No murmurs, gallops or rubs. Abdomen:Soft. Bowel sounds present. Non-tender.  Extremities: No lower extremity edema. Pulses are 2 + in the bilateral DP/PT.  Stress myoview 01/23/14:  Stress Procedure: The patient received IV Lexiscan 0.4 mg over 15-seconds with concurrent low level exercise and then Technetium 60m Sestamibi was injected at 30-seconds while the  patient continued walking one more minute. Quantitative spect images were obtained after a 45-minute delay.  Stress ECG: No significant change from baseline ECG  QPS  Raw Data Images: There is interference from nuclear activity from structures below the diaphragm. This does not affect the ability to read the study.  Stress Images: There is decreased uptake in the inferior wall.  Rest Images: There is decreased uptake in the inferior wall.  Subtraction (SDS): These findings are consistent with ischemia.  Transient Ischemic Dilatation (Normal <1.22): 1.33  Lung/Heart Ratio (Normal <0.45): 0.33  Quantitative Gated Spect Images  QGS EDV: 119 ml  QGS ESV: 62 ml  Impression  Exercise Capacity: Lexiscan with low level exercise.  BP Response: Normal blood pressure response.  Clinical Symptoms: No symptoms.  ECG Impression: No significant ECG changes with Lexiscan.  Comparison with Prior Nuclear Study: No images to compare  Overall Impression: Intermediate risk stress nuclear study with reversible inferior ischemia. There is underlying bowel, however, the SDS is 11. Extent 23%..  LV Ejection Fraction: 48%. LV Wall Motion: Mild inferior hypokinesis.  Cardiac cath 03/17/14: Left Main: No obstructive disease.  Left anterior Descending: Large caliber vessel that courses to the apex. The mid vessel has a 50% stenosis involving the takeoff of  both diagonal branches. The distal LAD has minor luminal irregularities. The first diagonal is a moderate caliber vessel with ostial 30-40% stenosis at the ostium.  Left Circumflex: Large vessel that trifurcates quickly into 3 branches. The first OM is small in caliber and is sub-totally occluded proximally, unchanged from last cath. The second OM is small in caliber and has a mid 80% stenosis. The AV groove branch is small and provides collateral flow the the distal RCA.  Right Coronary Artery: Large dominant vessel with proximal 60% stenosis followed by 100% total occlusion in the mid vessel. There are small bridging collaterals that supply the mid and distal vessel. The distal vessel is also seen to fill from left to right collaterals.  Left Ventricular Angiogram: LVEF=45%.   EKG:  EKG is ordered today. The ekg ordered today demonstrates NSR, rate 82 bpm. PVCs.   Recent Labs: 02/26/2015: ALT 36; Hemoglobin 13.1; Platelets 275.0 07/16/2015: BUN 26*; Creatinine 1.5*; Potassium 4.9; Sodium 138   Lipid Panel    Component Value Date/Time   CHOL 152 07/16/2015   TRIG 137 07/16/2015   HDL 45 07/16/2015   CHOLHDL 4 02/26/2015 0913   VLDL 26.4 02/26/2015 0913   LDLCALC 80 07/16/2015     Wt Readings from Last 3 Encounters:  09/14/15 218 lb 12.8 oz (99.247 kg)  08/31/15 225 lb (102.059 kg)  06/01/15 224 lb (101.606 kg)     Other studies Reviewed: Additional studies/ records that were reviewed today include: . Review of the above records demonstrates:    Assessment and Plan:   1. CAD: Stable by cath October 2015. Continue ASA, statin, beta blocker, Ace-inh,Imdur.    2. HTN: BP controlled. No changes today  3. HLD: Lipids well controlled. Continue statin.  4. PVCs: Will continue Lopressor 50 mg po BID.   Current medicines are reviewed at length with the patient today.  The patient does not have concerns regarding medicines.  The following changes have been made:  no change  Labs/  tests ordered today include:   Orders Placed This Encounter  Procedures  . EKG 12-Lead     Disposition:   FU with me in 12  months   Signed, Verne Carrow, MD 09/14/2015 9:37  AM    [facility] [address] [place], [place], [place]  [address] Phone: [phone]; Fax: [phone]

## 2015-09-14 NOTE — Patient Instructions (Signed)

## 2015-09-17 DIAGNOSIS — R351 Nocturia: Secondary | ICD-10-CM | POA: Diagnosis not present

## 2015-09-17 DIAGNOSIS — Z0000 Encounter for general adult medical examination without abnormal findings: Secondary | ICD-10-CM | POA: Diagnosis not present

## 2015-09-17 DIAGNOSIS — N401 Enlarged prostate with lower urinary tract symptoms: Secondary | ICD-10-CM | POA: Diagnosis not present

## 2015-10-01 ENCOUNTER — Ambulatory Visit (INDEPENDENT_AMBULATORY_CARE_PROVIDER_SITE_OTHER): Payer: Medicare Other | Admitting: Family Medicine

## 2015-10-01 ENCOUNTER — Encounter: Payer: Self-pay | Admitting: Family Medicine

## 2015-10-01 VITALS — BP 130/60 | HR 88 | Temp 97.9°F | Wt 213.3 lb

## 2015-10-01 DIAGNOSIS — J4520 Mild intermittent asthma, uncomplicated: Secondary | ICD-10-CM

## 2015-10-01 DIAGNOSIS — Z889 Allergy status to unspecified drugs, medicaments and biological substances status: Secondary | ICD-10-CM

## 2015-10-01 MED ORDER — PREDNISONE 10 MG PO TABS: ORAL_TABLET | ORAL | Status: DC

## 2015-10-01 NOTE — Progress Notes (Addendum)
Subjective:    Patient ID: Richard Ho, male    DOB: 12-Nov-1940, 75 y.o.   MRN: [id number]  HPI  Richard Ho is a 75 year old male who presents today with wheezing and use of albuterol 4-5 times/day for 4 days, cough that he notes as nonproductive with one episode of minimal yellow sputum, congestion, and  rhinitis with clear drainage. Denies fever, chills, sweats, sinus pressure/pain, ear pain, recent antibiotic use and also recent sick contact exposure. Reports recent move 2 weeks ago into a new townhome and he states that symptoms started after this move with increased exposure to trees and plants. He reports increased use of albuterol which has provided moderate benefit. No other home treatments at this time.   Review of Systems  Constitutional: Negative for fever, chills and fatigue.  HENT: Positive for congestion and rhinorrhea. Negative for ear pain, nosebleeds, postnasal drip, sinus pressure and sneezing.   Eyes: Negative for redness, itching and visual disturbance.  Respiratory: Positive for cough, shortness of breath and wheezing. Negative for chest tightness.   Cardiovascular: Negative for chest pain, palpitations and leg swelling.  Gastrointestinal: Negative for nausea, vomiting, abdominal pain and diarrhea.  Musculoskeletal: Negative for myalgias.  Skin: Negative for rash.  Allergic/Immunologic: Positive for environmental allergies.  Neurological: Negative for dizziness, light-headedness, numbness and headaches.  Psychiatric/Behavioral:       Denies depressed or anxious mood   Past Medical History  Diagnosis Date  . Asthma   . Coronary artery disease 1991    a) Angioplasty LAD 1991 [place] b) MI in 1995 at  [facility] in [place], med Rx  . GERD (gastroesophageal reflux disease)   . Hyperlipidemia   . Myocardial infarct (HCC)   . Nephrolithiasis   . Osteopenia   . HTN (hypertension)   . Diabetes mellitus   . Hearing loss      Social History   Social  History  . Marital Status: Married    Spouse Name: N/A  . Number of Children: N/A  . Years of Education: N/A   Occupational History  . Retired    Social History Main Topics  . Smoking status: Never Smoker   . Smokeless tobacco: Never Used  . Alcohol Use: 0.0 oz/week    0 Standard drinks or equivalent per week     Comment: occ, once every 2 months  . Drug Use: No  . Sexual Activity: Not on file   Other Topics Concern  . Not on file   Social History Narrative    Past Surgical History  Procedure Laterality Date  . Inguinal hernia repair    . Ptca    . Vasectomy    . Orif tibia & fibula fractures    . Angioplasty  1991  . Eye surgery  2010    cataract - right  . Cholecystectomy  2012  . Cardiac catheterization  2005    Moderate LCx and LAD disease and severe diffuse RCA disease  . Cardiac catheterization  01/2012    normal left main, 50% pLAD stenosis, dLAD mild luminal irregularities, D1 30-40% stenosis at take off; subtotally occluded/severely diseased OM1, mid 80% stenosis in small OM2, long prox and mid RCA stenoses ranging from 60-95%, severely diseased PDA, distal PLSA occluded filling via L-R collateral; normal LV function; inferior lateral basal akinesis.  . Left heart catheterization with coronary angiogram N/A 01/28/2012    Procedure: LEFT HEART CATHETERIZATION WITH CORONARY ANGIOGRAM;  Surgeon: Iran Ouch, MD;  Location: [facility] CATH LAB;  Service: Cardiovascular;  Laterality: N/A;  . Left heart catheterization with coronary angiogram N/A 03/17/2014    Procedure: LEFT HEART CATHETERIZATION WITH CORONARY ANGIOGRAM;  Surgeon: Kathleene Hazel, MD;  Location: [facility] CATH LAB;  Service: Cardiovascular;  Laterality: N/A;    Family History  Problem Relation Age of Onset  . Asthma Mother   . Allergies Sister   . Coronary artery disease Other     Allergies  Allergen Reactions  . Meloxicam Other (See Comments)    "jumping out of my skin" Pt feels anxiety attacks  when on this medicine    Current Outpatient Prescriptions on File Prior to Visit  Medication Sig Dispense Refill  . albuterol (PROVENTIL HFA;VENTOLIN HFA) 108 (90 BASE) MCG/ACT inhaler Inhale 2 puffs into the lungs every 4 (four) hours as needed. For shortness of breath    . aspirin EC 81 MG tablet Take 81 mg by mouth daily.    Marland Kitchen b complex vitamins capsule Take 1 capsule by mouth daily.    . fluticasone (FLONASE) 50 MCG/ACT nasal spray Place 2 sprays into both nostrils daily as needed for allergies. 16 g 5  . gabapentin (NEURONTIN) 300 MG capsule Take 900 mg by mouth 3 (three) times daily.     Marland Kitchen glucose blood (PRECISION XTRA TEST STRIPS) test strip USE TO CHECK BLOOD SUGAR DAILY 100 each 3  . HYDROcodone-homatropine (HYCODAN) 5-1.5 MG/5ML syrup Take 5 mLs by mouth every 6 (six) hours as needed for cough. 120 mL 0  . hyoscyamine (LEVSIN SL) 0.125 MG SL tablet Place 1 tablet (0.125 mg total) under the tongue every 4 (four) hours as needed. 90 tablet 5  . isosorbide mononitrate (IMDUR) 30 MG 24 hr tablet TAKE ONE TABLET BY MOUTH TWICE DAILY. 180 tablet 3  . Melatonin 3 MG CAPS Take 1 capsule by mouth at bedtime.    . metFORMIN (GLUCOPHAGE) 500 MG tablet Take 500 mg by mouth 2 (two) times daily with a meal.    . metoprolol (LOPRESSOR) 50 MG tablet Take 1 tablet (50 mg total) by mouth 2 (two) times daily. 180 tablet 3  . nitroGLYCERIN (NITROSTAT) 0.4 MG SL tablet Place 1 tablet (0.4 mg total) under the tongue every 5 (five) minutes as needed. Chest pain 25 tablet 3  . nortriptyline (PAMELOR) 25 MG capsule TAKE ONE CAPSULE BY MOUTH AT BEDTIME 90 capsule 0  . ramipril (ALTACE) 5 MG capsule Take 1 capsule (5 mg total) by mouth daily. 90 capsule 1  . simvastatin (ZOCOR) 80 MG tablet Take 40 mg by mouth at bedtime. Rx from [place]    . tamsulosin (FLOMAX) 0.4 MG CAPS capsule TAKE ONE CAPSULE BY MOUTH DAILY 30 capsule 5  . traZODone (DESYREL) 50 MG tablet Take 0.5-1 tablets (25-50 mg total) by mouth at bedtime  as needed for sleep. 30 tablet 3  . levothyroxine (SYNTHROID, LEVOTHROID) 25 MCG tablet Take 25 mcg by mouth daily. Pt getting Rx from [place]     No current facility-administered medications on file prior to visit.    BP 130/60 mmHg  Pulse 88  Temp(Src) 97.9 F (36.6 C) (Oral)  Wt 213 lb 4.8 oz (96.752 kg)  SpO2 97%       Objective:   Physical Exam  Constitutional: He is oriented to person, place, and time. He appears well-developed and well-nourished.  HENT:  Right Ear: Tympanic membrane normal.  Left Ear: Tympanic membrane normal.  Nose: Rhinorrhea present. Right sinus exhibits no maxillary  sinus tenderness and no frontal sinus tenderness. Left sinus exhibits no maxillary sinus tenderness and no frontal sinus tenderness.  Mouth/Throat: Mucous membranes are normal. No oropharyngeal exudate or posterior oropharyngeal erythema.  Erythematous nasal membranes  Eyes: Pupils are equal, round, and reactive to light.  Neck: Neck supple.  Cardiovascular: Normal rate and regular rhythm.   Pulmonary/Chest: Effort normal. He has wheezes.  Scattered wheezes only  Abdominal: Soft.  Lymphadenopathy:    He has no cervical adenopathy.  Neurological: He is alert and oriented to person, place, and time.  Skin: Skin is warm and dry. No rash noted.  Psychiatric: He has a normal mood and affect. His behavior is normal. Judgment and thought content normal.      Assessment & Plan:  1. Asthma, mild intermittent, uncomplicated RTC if symptoms persist after prednisone taper. Discussed use of albuterol and proper use of inhaler.  - predniSONE (DELTASONE) 10 MG tablet; Take 4 tablets by mouth daily for 2 days, 3 tablets daily for 2 days,  2 tablets daily for 2 days, and 1 tablet daily for 2 days.  Dispense: 20 tablet; Refill: 0  2. History of seasonal allergies Restart flonase and if symptoms of seasonal allergies continue, OTC treatment short term with use of allegra, claritin, or zyrtec can be  considered if needed.  Richard McJulia Gizzelle Lacomb, FNP-C

## 2015-10-01 NOTE — Patient Instructions (Addendum)
Please take medication as directed and return to clinic if symptoms persist, worsen, or you develop a fever >101. Follow up with PCP for annual physical or sooner if you continue to use your rescue inhaler after treatment with prednisone therapy. Restart flonase for allergic rhinitis symptoms.  Asthma, Adult Asthma is a condition of the lungs in which the airways tighten and narrow. Asthma can make it hard to breathe. Asthma cannot be cured, but medicine and lifestyle changes can help control it. Asthma may be started (triggered) by:  Animal skin flakes (dander).  Dust.  Cockroaches.  Pollen.  Mold.  Smoke.  Cleaning products.  Hair sprays or aerosol sprays.  Paint fumes or strong smells.  Cold air, weather changes, and winds.  Crying or laughing hard.  Stress.  Certain medicines or drugs.  Foods, such as dried fruit, potato chips, and sparkling grape juice.  Infections or conditions (colds, flu).  Exercise.  Certain medical conditions or diseases.  Exercise or tiring activities. HOME CARE   Take medicine as told by your doctor.  Use a peak flow meter as told by your doctor. A peak flow meter is a tool that measures how well the lungs are working.  Record and keep track of the peak flow meter's readings.  Understand and use the asthma action plan. An asthma action plan is a written plan for taking care of your asthma and treating your attacks.  To help prevent asthma attacks:  Do not smoke. Stay away from secondhand smoke.  Change your heating and air conditioning filter often.  Limit your use of fireplaces and wood stoves.  Get rid of pests (such as roaches and mice) and their droppings.  Throw away plants if you see mold on them.  Clean your floors. Dust regularly. Use cleaning products that do not smell.  Have someone vacuum when you are not home. Use a vacuum cleaner with a HEPA filter if possible.  Replace carpet with wood, tile, or vinyl  flooring. Carpet can trap animal skin flakes and dust.  Use allergy-proof pillows, mattress covers, and box spring covers.  Wash bed sheets and blankets every week in hot water and dry them in a dryer.  Use blankets that are made of polyester or cotton.  Clean bathrooms and kitchens with bleach. If possible, have someone repaint the walls in these rooms with mold-resistant paint. Keep out of the rooms that are being cleaned and painted.  Wash hands often. GET HELP IF:  You have make a whistling sound when breaking (wheeze), have shortness of breath, or have a cough even if taking medicine to prevent attacks.  The colored mucus you cough up (sputum) is thicker than usual.  The colored mucus you cough up changes from clear or white to yellow, green, gray, or bloody.  You have problems from the medicine you are taking such as:  A rash.  Itching.  Swelling.  Trouble breathing.  You need reliever medicines more than 2-3 times a week.  Your peak flow measurement is still at 50-79% of your personal best after following the action plan for 1 hour.  You have a fever. GET HELP RIGHT AWAY IF:   You seem to be worse and are not responding to medicine during an asthma attack.  You are short of breath even at rest.  You get short of breath when doing very little activity.  You have trouble eating, drinking, or talking.  You have chest pain.  You have a fast heartbeat.  Your lips or fingernails start to turn blue.  You are light-headed, dizzy, or faint.  Your peak flow is less than 50% of your personal best.   This information is not intended to replace advice given to you by your health care provider. Make sure you discuss any questions you have with your health care provider.   Document Released: 11/12/2007 Document Revised: 02/14/2015 Document Reviewed: 12/23/2012 Elsevier Interactive Patient Education [employer]

## 2015-10-01 NOTE — Progress Notes (Signed)
Pre visit review using our clinic review tool, if applicable. No additional management support is needed unless otherwise documented below in the visit note. 

## 2015-10-02 ENCOUNTER — Encounter: Payer: Self-pay | Admitting: *Deleted

## 2015-10-02 NOTE — Progress Notes (Signed)
Order(s) created erroneously. Erroneous order ID: [id number] ° Order moved by: STARKE, JESSICA N ° Order move date/time: 10/02/2015 11:14 AM ° Source Patient: [id number] ° Source Contact: 10/01/2015 ° Destination Patient: [id number] ° Destination Contact: 04/23/2015 ° Erroneous order ID: [id number] ° Order moved by: STARKE, JESSICA N ° Order move date/time: 10/02/2015 11:14 AM ° Source Patient: [id number] ° Source Contact: 10/01/2015 ° Destination Patient: [id number] ° Destination Contact: 04/23/2015 ° Erroneous order ID: [id number] ° Order moved by: STARKE, JESSICA N ° Order move date/time: 10/02/2015 11:14 AM ° Source Patient: [id number] ° Source Contact: 10/01/2015 ° Destination Patient: [id number] ° Destination Contact: 04/23/2015 °

## 2015-10-02 NOTE — Progress Notes (Signed)
Removed the following orders and results from this patient (MRN: [id number]) to a test patient (MRN: [id number], DOS: 04/23/15) in PRD per the request of Griselda MinerCarmen Jimenez, [place]:  [id number]  Please reference [id number] in Service-Now for more information.   Smith MinceJessica N Starke, Ambulatory Analyst

## 2015-10-22 ENCOUNTER — Ambulatory Visit (INDEPENDENT_AMBULATORY_CARE_PROVIDER_SITE_OTHER): Payer: Medicare Other | Admitting: Family Medicine

## 2015-10-22 ENCOUNTER — Encounter: Payer: Self-pay | Admitting: Family Medicine

## 2015-10-22 ENCOUNTER — Ambulatory Visit (INDEPENDENT_AMBULATORY_CARE_PROVIDER_SITE_OTHER)
Admission: RE | Admit: 2015-10-22 | Discharge: 2015-10-22 | Disposition: A | Discharge status: Home / Self Care | Payer: Medicare Other | Source: Ambulatory Visit | Attending: Family Medicine | Admitting: Family Medicine

## 2015-10-22 VITALS — BP 120/70 | HR 79 | Ht 70.0 in | Wt 212.2 lb

## 2015-10-22 DIAGNOSIS — R05 Cough: Secondary | ICD-10-CM | POA: Diagnosis not present

## 2015-10-22 DIAGNOSIS — R059 Cough, unspecified: Secondary | ICD-10-CM

## 2015-10-22 DIAGNOSIS — R062 Wheezing: Secondary | ICD-10-CM

## 2015-10-22 DIAGNOSIS — J4520 Mild intermittent asthma, uncomplicated: Secondary | ICD-10-CM | POA: Diagnosis not present

## 2015-10-22 DIAGNOSIS — R918 Other nonspecific abnormal finding of lung field: Secondary | ICD-10-CM | POA: Diagnosis not present

## 2015-10-22 MED ORDER — ALBUTEROL SULFATE (2.5 MG/3ML) 0.083% IN NEBU
2.5000 mg | INHALATION_SOLUTION | Freq: Once | RESPIRATORY_TRACT | Status: AC
  Administered 2015-10-22: 2.5 mg via RESPIRATORY_TRACT

## 2015-10-22 MED ORDER — DOXYCYCLINE HYCLATE 100 MG PO TABS: 100.0000 mg | ORAL_TABLET | Freq: Two times a day (BID) | ORAL | Status: DC

## 2015-10-22 NOTE — Progress Notes (Signed)
Subjective:    Patient ID: Richard Ho, male    DOB: 09-02-1940, 75 y.o.   MRN: [id number]  HPI  Richard Ho is a 75 year old male who presents today with symptoms of wheezing, dyspnea, coughing with yellow sputum , rhinitis with clear nasal drainage, and use of rescue inhaler 4 times/day for 4 days. He denies fever, chills, sweats, sinus pressure/pain, ear pain, recent antibiotic use and recent sick contact exposure.  On 10/01/15 he was seen and treated for an exacerbation of mild intermittent asthma. Symptoms of cough, that was noted as nonproductive with the exception of one episode of minimal yellow sputum, rhinitis, and congestion were present. Triggers of increased exposure to pollen/trees and recent move to a new town home. He completed a prednisone taper that provided benefit, however cough has remained present for 2 weeks symptoms have returned. Current treatment includes flonase, zyrtec daily which has provided minimal benefit. He also reports that he may have not been using his albuterol inhaler properly. History of asthma  in 2007 and a limited episode of Advair provided benefit. He has also responded well to prednisone tapers that have been prescribed previously for intermittent exacerbations. He also has Type 2 Diabetes that is well controlled.   Review of Systems  Constitutional: Negative for fever and chills.  HENT: Positive for rhinorrhea. Negative for congestion, ear pain, postnasal drip, sinus pressure, sneezing and sore throat.   Eyes: Negative for itching.  Respiratory: Positive for cough, shortness of breath and wheezing.   Cardiovascular: Negative for chest pain, palpitations and leg swelling.  Gastrointestinal: Negative for nausea, vomiting, abdominal pain and diarrhea.  Musculoskeletal: Negative for myalgias.  Skin: Negative for rash.  Neurological: Negative for dizziness, light-headedness and headaches.  Psychiatric/Behavioral:       Denies depressed or anxious  mood   Past Medical History  Diagnosis Date  . Asthma   . Coronary artery disease 1991    a) Angioplasty LAD 1991 [place] b) MI in 1995 at  [facility] in [place], med Rx  . GERD (gastroesophageal reflux disease)   . Hyperlipidemia   . Myocardial infarct (HCC)   . Nephrolithiasis   . Osteopenia   . HTN (hypertension)   . Diabetes mellitus   . Hearing loss      Social History   Social History  . Marital Status: Married    Spouse Name: N/A  . Number of Children: N/A  . Years of Education: N/A   Occupational History  . Retired    Social History Main Topics  . Smoking status: Never Smoker   . Smokeless tobacco: Never Used  . Alcohol Use: 0.0 oz/week    0 Standard drinks or equivalent per week     Comment: occ, once every 2 months  . Drug Use: No  . Sexual Activity: Not on file   Other Topics Concern  . Not on file   Social History Narrative    Past Surgical History  Procedure Laterality Date  . Inguinal hernia repair    . Ptca    . Vasectomy    . Orif tibia & fibula fractures    . Angioplasty  1991  . Eye surgery  2010    cataract - right  . Cholecystectomy  2012  . Cardiac catheterization  2005    Moderate LCx and LAD disease and severe diffuse RCA disease  . Cardiac catheterization  01/2012    normal left main, 50% pLAD stenosis, dLAD mild luminal  irregularities, D1 30-40% stenosis at take off; subtotally occluded/severely diseased OM1, mid 80% stenosis in small OM2, long prox and mid RCA stenoses ranging from 60-95%, severely diseased PDA, distal PLSA occluded filling via L-R collateral; normal LV function; inferior lateral basal akinesis.  . Left heart catheterization with coronary angiogram N/A 01/28/2012    Procedure: LEFT HEART CATHETERIZATION WITH CORONARY ANGIOGRAM;  Surgeon: Iran OuchMuhammad A Arida, MD;  Location: [facility] CATH [facility];  Service: Cardiovascular;  Laterality: N/A;  . Left heart catheterization with coronary angiogram N/A 03/17/2014    Procedure:  LEFT HEART CATHETERIZATION WITH CORONARY ANGIOGRAM;  Surgeon: Kathleene Hazelhristopher D McAlhany, MD;  [facility];  Service: Cardiovascular;  Laterality: N/A;    Family History  Problem Relation Age of Onset  . Asthma Mother   . Allergies Sister   . Coronary artery disease Other     Allergies  Allergen Reactions  . Meloxicam Other (See Comments)    "jumping out of my skin" Pt feels anxiety attacks when on this medicine    Current Outpatient Prescriptions on File Prior to Visit  Medication Sig Dispense Refill  . albuterol (PROVENTIL HFA;VENTOLIN HFA) 108 (90 BASE) MCG/ACT inhaler Inhale 2 puffs into the lungs every 4 (four) hours as needed. For shortness of breath    . aspirin EC 81 MG tablet Take 81 mg by mouth daily.    Marland Kitchen. b complex vitamins capsule Take 1 capsule by mouth daily.    . fluticasone (FLONASE) 50 MCG/ACT nasal spray Place 2 sprays into both nostrils daily as needed for allergies. 16 g 5  . gabapentin (NEURONTIN) 300 MG capsule Take 900 mg by mouth 3 (three) times daily.     Marland Kitchen. glucose blood (PRECISION XTRA TEST STRIPS) test strip USE TO CHECK BLOOD SUGAR DAILY 100 each 3  . hyoscyamine (LEVSIN SL) 0.125 MG SL tablet Place 1 tablet (0.125 mg total) under the tongue every 4 (four) hours as needed. 90 tablet 5  . isosorbide mononitrate (IMDUR) 30 MG 24 hr tablet TAKE ONE TABLET BY MOUTH TWICE DAILY. 180 tablet 3  . Melatonin 3 MG CAPS Take 1 capsule by mouth at bedtime.    . metFORMIN (GLUCOPHAGE) 500 MG tablet Take 500 mg by mouth 2 (two) times daily with a meal.    . metoprolol (LOPRESSOR) 50 MG tablet Take 1 tablet (50 mg total) by mouth 2 (two) times daily. 180 tablet 3  . nitroGLYCERIN (NITROSTAT) 0.4 MG SL tablet Place 1 tablet (0.4 mg total) under the tongue every 5 (five) minutes as needed. Chest pain 25 tablet 3  . nortriptyline (PAMELOR) 25 MG capsule TAKE ONE CAPSULE BY MOUTH AT BEDTIME 90 capsule 0  . ramipril (ALTACE) 5 MG capsule Take 1 capsule (5 mg total) by  mouth daily. 90 capsule 1  . simvastatin (ZOCOR) 80 MG tablet Take 40 mg by mouth at bedtime. Rx from [facility]    . tamsulosin (FLOMAX) 0.4 MG CAPS capsule TAKE ONE CAPSULE BY MOUTH DAILY 30 capsule 5  . traZODone (DESYREL) 50 MG tablet Take 0.5-1 tablets (25-50 mg total) by mouth at bedtime as needed for sleep. 30 tablet 3  . levothyroxine (SYNTHROID, LEVOTHROID) 25 MCG tablet Take 25 mcg by mouth daily. Pt getting Rx [facility]     No current facility-administered medications on file prior to visit.    BP 120/70 mmHg  Pulse 79  Ht 5\' 10"  (1.778 m)  Wt 212 lb 3.2 oz (96.253 kg)  BMI 30.45 kg/m2  SpO2  96%     Objective:   Physical Exam  Constitutional: He is oriented to person, place, and time. He appears well-developed and well-nourished.  HENT:  Right Ear: Tympanic membrane normal.  Left Ear: Tympanic membrane normal.  Nose: Rhinorrhea present. Right sinus exhibits no maxillary sinus tenderness and no frontal sinus tenderness. Left sinus exhibits no maxillary sinus tenderness and no frontal sinus tenderness.  Mouth/Throat: Mucous membranes are normal. No oropharyngeal exudate or posterior oropharyngeal erythema.  Eyes: Pupils are equal, round, and reactive to light. No scleral icterus.  Neck: Neck supple.  Cardiovascular: Normal rate and regular rhythm.   Pulmonary/Chest: Effort normal. He has wheezes. He has rales.  Scattered wheezing noted with crackles in right lower lobe.  Abdominal: Soft. Bowel sounds are normal.  Lymphadenopathy:    He has no cervical adenopathy.  Neurological: He is alert and oriented to person, place, and time.  Skin: Skin is warm and dry. No rash noted.  Psychiatric: He has a normal mood and affect. His behavior is normal. Judgment and thought content normal.       Assessment & Plan:  1. Asthmatic bronchitis, mild intermittent, uncomplicated Exam and history support empiric treatment. Chest X-ray ordered to rule out pneumonia with symptoms of dyspnea and  cough for 2 weeks with mucopurulent sputum. Macrolide therapy not indicated due to increased risk of QT prolongation and use of nortriptyline.  - doxycycline (VIBRA-TABS) 100 MG tablet; Take 1 tablet (100 mg total) by mouth 2 (two) times daily.  Dispense: 20 tablet; Refill: 0  2. Wheezing O2 sat 96%. Wheezing improved after in office nebulizer treatment. Provided instructions for proper use of albuterol inhaler. Patient successfully demonstrated proper use with teach back method.  3. Cough  - DG Chest 2 View; Future  Advised patient on supportive measures:  Get rest, drink plenty of fluids and follow up if fever >101, if symptoms worsen or if symptoms are not improved in 3-4 days or sooner if needed. Patient verbalizes understanding.   Roddie Mc, FNP-C

## 2015-10-22 NOTE — Addendum Note (Signed)
Addended by: Dixie DialsVALENCIA, Uri Covey on: 10/22/2015 03:53 PM   Modules accepted: Orders

## 2015-10-22 NOTE — Patient Instructions (Addendum)
Please go for the chest X-ray that has been ordered for you. You will be notified of the results of your chest X-ray and further treatment will be initiated if needed. Get rest, drink plenty of fluids and follow up if fever >101, if symptoms worsen or if symptoms are not improved in 3-4 days or sooner if needed. Patient verbalizes understanding.    Chronic Bronchitis Chronic bronchitis is a lasting inflammation of the bronchial tubes, which are the tubes that carry air into your lungs. This is inflammation that occurs:   On most days of the week.   For at least three months at a time.   Over a period of two years in a row. When the bronchial tubes are inflamed, they start to produce mucus. The inflammation and buildup of mucus make it more difficult to breathe. Chronic bronchitis is usually a permanent problem and is one type of chronic obstructive pulmonary disease (COPD). People with chronic bronchitis are at greater risk for getting repeated colds, or respiratory infections. CAUSES  Chronic bronchitis most often occurs in people who have:  Long-standing, severe asthma.  A history of smoking.  Asthma and who also smoke. SIGNS AND SYMPTOMS  Chronic bronchitis may cause the following:   A cough that brings up mucus (productive cough).  Shortness of breath.  Early morning headache.  Wheezing.  Chest discomfort.   Recurring respiratory infections. DIAGNOSIS  Your health care provider may confirm the diagnosis by:  Taking your medical history.  Performing a physical exam.  Taking a chest X-ray.   Performing pulmonary function tests. TREATMENT  Treatment involves controlling symptoms with medicines, oxygen therapy, or making lifestyle changes, such as exercising and eating a healthy, well-balanced diet. Medicines could include:  Inhalers to improve air flow in and out of your lungs.  Antibiotics to treat bacterial infections, such as pneumonia, sinus infections, and  acute bronchitis. As a preventative measure, your health care provider may recommend routine vaccinations for influenza and pneumonia. This is to prevent infection and hospitalization since you may be more at risk for these types of infections.  HOME CARE INSTRUCTIONS  Take medicines only as directed by your health care provider.   If you smoke cigarettes, chew tobacco, or use electronic cigarettes, quit. If you need help quitting, ask your health care provider.  Avoid pollen, dust, animal dander, molds, smoke, and other things that cause shortness of breath or wheezing attacks.  Talk to your health care provider about possible exercise routines. Regular exercise is very important to help you feel better.  If you are prescribed oxygen use at home follow these guidelines:  Never smoke while using oxygen. Oxygen does not burn or explode, but flammable materials will burn faster in the presence of oxygen.  Keep a Government social research officerfire extinguisher close by. Let your fire department know that you have oxygen in your home.  Warn visitors not to smoke near you when you are using oxygen. Put up "no smoking" signs in your home where you most often use the oxygen.  Regularly test your smoke detectors at home to make sure they work. If you receive care in your home from a nurse or other health care provider, he or she may also check to make sure your smoke detectors work.  Ask your health care provider whether you would benefit from a pulmonary rehabilitation program.  Do not wait to get medical care if you have any concerning symptoms. Delays could cause permanent injury and may be life threatening.  SEEK MEDICAL CARE IF:  You have increased coughing or shortness of breath or both.  You have muscle aches.  You have chest pain.  Your mucus gets thicker.  Your mucus changes from clear or white to yellow, green, gray, or bloody. SEEK IMMEDIATE MEDICAL CARE IF:  Your usual medicines do not stop your  wheezing.   You have increased difficulty breathing.   You have any problems with the medicine you are taking, such as a rash, itching, swelling, or trouble breathing. MAKE SURE YOU:   Understand these instructions.  Will watch your condition.  Will get help right away if you are not doing well or get worse.   This information is not intended to replace advice given to you by your health care provider. Make sure you discuss any questions you have with your health care provider.   Document Released: 03/13/2006 Document Revised: 06/16/2014 Document Reviewed: 07/04/2013 Elsevier Interactive Patient Education [employer]

## 2015-10-29 ENCOUNTER — Encounter: Payer: Self-pay | Admitting: Family Medicine

## 2015-10-29 ENCOUNTER — Ambulatory Visit: Payer: Medicare Other | Admitting: Family Medicine

## 2015-10-29 ENCOUNTER — Ambulatory Visit (INDEPENDENT_AMBULATORY_CARE_PROVIDER_SITE_OTHER): Payer: Medicare Other | Admitting: Family Medicine

## 2015-10-29 VITALS — BP 115/80 | HR 93 | Temp 98.6°F | Ht 70.0 in | Wt 213.8 lb

## 2015-10-29 DIAGNOSIS — R05 Cough: Secondary | ICD-10-CM | POA: Diagnosis not present

## 2015-10-29 DIAGNOSIS — R059 Cough, unspecified: Secondary | ICD-10-CM

## 2015-10-29 DIAGNOSIS — J4530 Mild persistent asthma, uncomplicated: Secondary | ICD-10-CM

## 2015-10-29 MED ORDER — FLUTICASONE-SALMETEROL 100-50 MCG/DOSE IN AEPB: 1.0000 | INHALATION_SPRAY | Freq: Two times a day (BID) | RESPIRATORY_TRACT | Status: DC

## 2015-10-29 NOTE — Progress Notes (Signed)
Subjective:    Patient ID: Richard Ho, male    DOB: 10/29/1940, 75 y.o.   MRN: [id number]  HPI  [name] is a 75 year old male who presents today for follow up for asthmatic bronchitis and a cough that is productive with clear sputum which has been present since 10/01/15 but has improved with recent initiation of Advair.  Associated symptoms of  rhinitis, sneezing, and watery eyes are noted. He denies fever, chills, sweats, sinus pressure/congestion. Pertinent history of mild intermittent asthma with increased use of albuterol prior to use of Advair.  Triggers of pollen/trees and recent move to a new town home are present. He is currently using Zyrtec daily that has provided moderate benefit. He has also responded well to prednisone tapers that have been prescribed previously for intermittent exacerbations.        Review of Systems  Constitutional: Negative for fever, chills and fatigue.  HENT: Positive for postnasal drip and rhinorrhea. Negative for congestion, nosebleeds, sinus pressure and sneezing.   Eyes: Negative for itching and visual disturbance.  Respiratory: Positive for cough.   Cardiovascular: Negative for chest pain, palpitations and leg swelling.  Gastrointestinal: Negative for nausea, vomiting, abdominal pain, diarrhea and constipation.  Genitourinary: Negative for dysuria and hematuria.  Musculoskeletal: Negative for myalgias and back pain.  Skin: Negative for rash.  Allergic/Immunologic: Positive for environmental allergies.  Neurological: Negative for dizziness, light-headedness and headaches.   Past Medical History  Diagnosis Date  . Asthma   . Coronary artery disease 1991    a) Angioplasty LAD 1991 [place] b) MI in 1995 at  [facility] in [place], med Rx  . GERD (gastroesophageal reflux disease)   . Hyperlipidemia   . Myocardial infarct (HCC)   . Nephrolithiasis   . Osteopenia   . HTN (hypertension)   . Diabetes mellitus   . Hearing loss        Social History   Social History  . Marital Status: Married    Spouse Name: N/A  . Number of Children: N/A  . Years of Education: N/A   Occupational History  . Retired    Social History Main Topics  . Smoking status: Never Smoker   . Smokeless tobacco: Never Used  . Alcohol Use: 0.0 oz/week    0 Standard drinks or equivalent per week     Comment: occ, once every 2 months  . Drug Use: No  . Sexual Activity: Not on file   Other Topics Concern  . Not on file   Social History Narrative    Past Surgical History  Procedure Laterality Date  . Inguinal hernia repair    . Ptca    . Vasectomy    . Orif tibia & fibula fractures    . Angioplasty  1991  . Eye surgery  2010    cataract - right  . Cholecystectomy  2012  . Cardiac catheterization  2005    Moderate LCx and LAD disease and severe diffuse RCA disease  . Cardiac catheterization  01/2012    normal left main, 50% pLAD stenosis, dLAD mild luminal irregularities, D1 30-40% stenosis at take off; subtotally occluded/severely diseased OM1, mid 80% stenosis in small OM2, long prox and mid RCA stenoses ranging from 60-95%, severely diseased PDA, distal PLSA occluded filling via L-R collateral; normal LV function; inferior lateral basal akinesis.  . Left heart catheterization with coronary angiogram N/A 01/28/2012    Procedure: LEFT HEART CATHETERIZATION WITH CORONARY ANGIOGRAM;  Surgeon: Jerolyn Center  Argentina Donovan, MD;  Location: [facility] CATH LAB;  Service: Cardiovascular;  Laterality: N/A;  . Left heart catheterization with coronary angiogram N/A 03/17/2014    Procedure: LEFT HEART CATHETERIZATION WITH CORONARY ANGIOGRAM;  Surgeon: Kathleene Hazel, MD;  Location: [facility] CATH LAB;  Service: Cardiovascular;  Laterality: N/A;    Family History  Problem Relation Age of Onset  . Asthma Mother   . Allergies Sister   . Coronary artery disease Other     Allergies  Allergen Reactions  . Meloxicam Other (See Comments)    "jumping out of my  skin" Pt feels anxiety attacks when on this medicine    Current Outpatient Prescriptions on File Prior to Visit  Medication Sig Dispense Refill  . albuterol (PROVENTIL HFA;VENTOLIN HFA) 108 (90 BASE) MCG/ACT inhaler Inhale 2 puffs into the lungs every 4 (four) hours as needed. For shortness of breath    . aspirin EC 81 MG tablet Take 81 mg by mouth daily.    . fluticasone (FLONASE) 50 MCG/ACT nasal spray Place 2 sprays into both nostrils daily as needed for allergies. 16 g 5  . gabapentin (NEURONTIN) 300 MG capsule Take 900 mg by mouth 3 (three) times daily.     Marland Kitchen glucose blood (PRECISION XTRA TEST STRIPS) test strip USE TO CHECK BLOOD SUGAR DAILY 100 each 3  . hyoscyamine (LEVSIN SL) 0.125 MG SL tablet Place 1 tablet (0.125 mg total) under the tongue every 4 (four) hours as needed. 90 tablet 5  . isosorbide mononitrate (IMDUR) 30 MG 24 hr tablet TAKE ONE TABLET BY MOUTH TWICE DAILY. 180 tablet 3  . Melatonin 3 MG CAPS Take 1 capsule by mouth at bedtime.    . metoprolol (LOPRESSOR) 50 MG tablet Take 1 tablet (50 mg total) by mouth 2 (two) times daily. 180 tablet 3  . nitroGLYCERIN (NITROSTAT) 0.4 MG SL tablet Place 1 tablet (0.4 mg total) under the tongue every 5 (five) minutes as needed. Chest pain 25 tablet 3  . nortriptyline (PAMELOR) 25 MG capsule TAKE ONE CAPSULE BY MOUTH AT BEDTIME 90 capsule 0  . ramipril (ALTACE) 5 MG capsule Take 1 capsule (5 mg total) by mouth daily. 90 capsule 1  . simvastatin (ZOCOR) 80 MG tablet Take 40 mg by mouth at bedtime. Rx from [place]    . tamsulosin (FLOMAX) 0.4 MG CAPS capsule TAKE ONE CAPSULE BY MOUTH DAILY 30 capsule 5  . traZODone (DESYREL) 50 MG tablet Take 0.5-1 tablets (25-50 mg total) by mouth at bedtime as needed for sleep. 30 tablet 3  . b complex vitamins capsule Take 1 capsule by mouth daily.    Marland Kitchen doxycycline (VIBRA-TABS) 100 MG tablet Take 1 tablet (100 mg total) by mouth 2 (two) times daily. 20 tablet 0  . levothyroxine (SYNTHROID, LEVOTHROID) 25  MCG tablet Take 25 mcg by mouth daily. Pt getting Rx from [place]    . metFORMIN (GLUCOPHAGE) 500 MG tablet Take 500 mg by mouth 2 (two) times daily with a meal.     No current facility-administered medications on file prior to visit.    BP 115/80 mmHg  Pulse 93  Temp(Src) 98.6 F (37 C) (Oral)  Ht  (1.778 m)  Wt 213 lb 12.8 oz (96.979 kg)  BMI 30.68 kg/m2  SpO2 97%      Objective:   Physical Exam  Constitutional: He is oriented to person, place, and time. He appears well-developed and well-nourished.  HENT:  Right Ear: Tympanic membrane normal.  Left Ear:  Tympanic membrane normal.  Nose: Rhinorrhea present. Right sinus exhibits no maxillary sinus tenderness and no frontal sinus tenderness. Left sinus exhibits no maxillary sinus tenderness and no frontal sinus tenderness.  Mouth/Throat: No oropharyngeal exudate or posterior oropharyngeal erythema.  Eyes: Pupils are equal, round, and reactive to light. No scleral icterus.  Neck: Neck supple.  Cardiovascular: Normal rate and regular rhythm.   Pulmonary/Chest: Effort normal.  Minimal scattered wheezing noted which has improved with initiation of Advair  Abdominal: Soft. Bowel sounds are normal.  Lymphadenopathy:    He has no cervical adenopathy.  Neurological: He is alert and oriented to person, place, and time.  Skin: Skin is warm and dry. No rash noted.  Psychiatric: He has a normal mood and affect. His behavior is normal. Judgment and thought content normal.       Assessment & Plan:  1. Asthmatic bronchitis, mild persistent, uncomplicated Continue with Advair as directed and do not add nasal fluticasone to regimen. Referral to allergy for evaluation and treatment with increased symptoms after relocating to new environment.   - Fluticasone-Salmeterol (ADVAIR) 100-50 MCG/DOSE AEPB; Inhale 1 puff into the lungs 2 (two) times daily.  Dispense: 1 each; Refill: 3 - Ambulatory referral to Allergy  2. Cough  Advised patient  to follow up with PCP for further evaluation and treatment of asthma after referral has been completed or sooner if needed. Patient voiced understanding and agreed with plan.  Roddie McJulia Hillel Card, FNP-C

## 2015-10-29 NOTE — Progress Notes (Signed)
Pre visit review using our clinic tool,if applicable. No additional management support is needed unless otherwise documented below in the visit note.  

## 2015-10-29 NOTE — Patient Instructions (Signed)
A referral will be placed for you today. Please contact the clinic if you have not received a call regarding an appointment in 7 to 10 business days.  Continue advair as prescribed and discontinue use of nasal fluticasone.  Follow up with your primary care provider if symptoms do not continue to improve or worsen.   Chronic Bronchitis Chronic bronchitis is a lasting inflammation of the bronchial tubes, which are the tubes that carry air into your lungs. This is inflammation that occurs:   On most days of the week.   For at least three months at a time.   Over a period of two years in a row. When the bronchial tubes are inflamed, they start to produce mucus. The inflammation and buildup of mucus make it more difficult to breathe. Chronic bronchitis is usually a permanent problem and is one type of chronic obstructive pulmonary disease (COPD). People with chronic bronchitis are at greater risk for getting repeated colds, or respiratory infections. CAUSES  Chronic bronchitis most often occurs in people who have:  Long-standing, severe asthma.  A history of smoking.  Asthma and who also smoke. SIGNS AND SYMPTOMS  Chronic bronchitis may cause the following:   A cough that brings up mucus (productive cough).  Shortness of breath.  Early morning headache.  Wheezing.  Chest discomfort.   Recurring respiratory infections. DIAGNOSIS  Your health care provider may confirm the diagnosis by:  Taking your medical history.  Performing a physical exam.  Taking a chest X-ray.   Performing pulmonary function tests. TREATMENT  Treatment involves controlling symptoms with medicines, oxygen therapy, or making lifestyle changes, such as exercising and eating a healthy, well-balanced diet. Medicines could include:  Inhalers to improve air flow in and out of your lungs.  Antibiotics to treat bacterial infections, such as pneumonia, sinus infections, and acute bronchitis. As a  preventative measure, your health care provider may recommend routine vaccinations for influenza and pneumonia. This is to prevent infection and hospitalization since you may be more at risk for these types of infections.  HOME CARE INSTRUCTIONS  Take medicines only as directed by your health care provider.   If you smoke cigarettes, chew tobacco, or use electronic cigarettes, quit. If you need help quitting, ask your health care provider.  Avoid pollen, dust, animal dander, molds, smoke, and other things that cause shortness of breath or wheezing attacks.  Talk to your health care provider about possible exercise routines. Regular exercise is very important to help you feel better.  If you are prescribed oxygen use at home follow these guidelines:  Never smoke while using oxygen. Oxygen does not burn or explode, but flammable materials will burn faster in the presence of oxygen.  Keep a Government social research officerfire extinguisher close by. Let your fire department know that you have oxygen in your home.  Warn visitors not to smoke near you when you are using oxygen. Put up "no smoking" signs in your home where you most often use the oxygen.  Regularly test your smoke detectors at home to make sure they work. If you receive care in your home from a nurse or other health care provider, he or she may also check to make sure your smoke detectors work.  Ask your health care provider whether you would benefit from a pulmonary rehabilitation program.  Do not wait to get medical care if you have any concerning symptoms. Delays could cause permanent injury and may be life threatening. SEEK MEDICAL CARE IF:  You have  increased coughing or shortness of breath or both.  You have muscle aches.  You have chest pain.  Your mucus gets thicker.  Your mucus changes from clear or white to yellow, green, gray, or bloody. SEEK IMMEDIATE MEDICAL CARE IF:  Your usual medicines do not stop your wheezing.   You have  increased difficulty breathing.   You have any problems with the medicine you are taking, such as a rash, itching, swelling, or trouble breathing. MAKE SURE YOU:   Understand these instructions.  Will watch your condition.  Will get help right away if you are not doing well or get worse.   This information is not intended to replace advice given to you by your health care provider. Make sure you discuss any questions you have with your health care provider.   Document Released: 03/13/2006 Document Revised: 06/16/2014 Document Reviewed: 07/04/2013 Elsevier Interactive Patient Education [employer]

## 2015-11-19 ENCOUNTER — Emergency Department (HOSPITAL_COMMUNITY)
Admission: EM | Admit: 2015-11-19 | Discharge: 2015-11-20 | Disposition: A | Discharge status: Home / Self Care | Payer: Medicare Other | Attending: Emergency Medicine | Admitting: Emergency Medicine

## 2015-11-19 ENCOUNTER — Encounter (HOSPITAL_COMMUNITY): Payer: Self-pay | Admitting: Emergency Medicine

## 2015-11-19 DIAGNOSIS — Z7984 Long term (current) use of oral hypoglycemic drugs: Secondary | ICD-10-CM | POA: Insufficient documentation

## 2015-11-19 DIAGNOSIS — Z79899 Other long term (current) drug therapy: Secondary | ICD-10-CM | POA: Insufficient documentation

## 2015-11-19 DIAGNOSIS — J45909 Unspecified asthma, uncomplicated: Secondary | ICD-10-CM | POA: Insufficient documentation

## 2015-11-19 DIAGNOSIS — Z7982 Long term (current) use of aspirin: Secondary | ICD-10-CM | POA: Diagnosis not present

## 2015-11-19 DIAGNOSIS — I10 Essential (primary) hypertension: Secondary | ICD-10-CM | POA: Diagnosis not present

## 2015-11-19 DIAGNOSIS — I252 Old myocardial infarction: Secondary | ICD-10-CM | POA: Insufficient documentation

## 2015-11-19 DIAGNOSIS — I2510 Atherosclerotic heart disease of native coronary artery without angina pectoris: Secondary | ICD-10-CM | POA: Diagnosis not present

## 2015-11-19 DIAGNOSIS — E119 Type 2 diabetes mellitus without complications: Secondary | ICD-10-CM | POA: Insufficient documentation

## 2015-11-19 DIAGNOSIS — E785 Hyperlipidemia, unspecified: Secondary | ICD-10-CM | POA: Insufficient documentation

## 2015-11-19 DIAGNOSIS — S60412A Abrasion of right middle finger, initial encounter: Secondary | ICD-10-CM | POA: Diagnosis not present

## 2015-11-19 DIAGNOSIS — T63001A Toxic effect of unspecified snake venom, accidental (unintentional), initial encounter: Secondary | ICD-10-CM | POA: Insufficient documentation

## 2015-11-19 DIAGNOSIS — Z7951 Long term (current) use of inhaled steroids: Secondary | ICD-10-CM | POA: Insufficient documentation

## 2015-11-19 DIAGNOSIS — W5919XA Other contact with nonvenomous snake, initial encounter: Secondary | ICD-10-CM

## 2015-11-19 DIAGNOSIS — J4520 Mild intermittent asthma, uncomplicated: Secondary | ICD-10-CM

## 2015-11-19 MED ORDER — TETANUS-DIPHTH-ACELL PERTUSSIS 5-2.5-18.5 LF-MCG/0.5 IM SUSP
0.5000 mL | Freq: Once | INTRAMUSCULAR | Status: AC
  Administered 2015-11-20: 0.5 mL via INTRAMUSCULAR
  Filled 2015-11-19: qty 0.5

## 2015-11-19 NOTE — ED Notes (Signed)
Pt states that he was bitten by a black snake with a white under belly tonight on his R middle finger tonight. Alert and oriented.

## 2015-11-20 MED ORDER — DOXYCYCLINE HYCLATE 100 MG PO TABS: 100.0000 mg | ORAL_TABLET | Freq: Two times a day (BID) | ORAL | Status: DC

## 2015-11-20 NOTE — Discharge Instructions (Signed)
Snake Bite °Snakes may be poisonous (venomous) or nonpoisonous (nonvenomous). A bite from a nonvenomous snake may cause a wound to the skin and possibly to the deeper tissues beneath the skin. A venomous snake will cause a wound and may also inject poison (venom) into the wound.  °The effects of snake venom vary depending on the type of snake. In some cases, the effects can be extremely serious or even deadly. A bite from a venomous snake is a medical emergency. Treatment may require the use of antivenom medicine. °SYMPTOMS  °Symptoms of a snake bite vary depending on the type of snake, whether the snake is venomous, and the severity of the bite. Symptoms for both a venomous or nonvenomous snake may include:  °· Pain, redness, and swelling at the site of the bite. °· Skin discoloration at the site of the bite.   °· A feeling of nervousness.   °Symptoms of a venomous snake bite may also include:  °· Increasing pain and swelling. °· Severe anxiety or confusion. °· Blood blisters or purple spots in the bite area.   °· Nausea and vomiting.   °· Numbness or tingling.   °· Muscle weakness.   °· Excessive fatigue or drowsiness. °· Excessive sweating.   °· Difficulty breathing.   °· Blurred vision.   °· Bruising and bleeding at the site of the bite. °· Feeling faint or light-headed. °In some cases, symptoms do not develop until a few hours after the bite.  °DIAGNOSIS  °This condition may be diagnosed based on symptoms and a physical exam. Your health care provider will examine the bite area and ask for details about the snake to help determine whether it is venomous. You may also have tests, including blood tests. °TREATMENT  °Treatment depends on the severity of the bite and whether the snake is venomous. °· Treatment for nonvenomous snake bites may involve basic wound care. This often includes cleaning the wound and applying a bandage (dressing). In some cases, antibiotic medicine or a tetanus shot may be  given. °· Treatment for venomous snake bites may include antivenom medicine in addition to wound care. This medicine needs to be given as soon as possible after the bite. Other treatments may be needed to help control symptoms as they develop. You may need to stay in a hospital so your condition can be monitored. °HOME CARE INSTRUCTIONS  °Wound Care °· Follow instructions from your health care provider about how to take care of your wound. Make sure you:   °¨ Wash your hands with soap and water before you change your dressing. If soap and water are not available, use hand sanitizer. °¨ Change your dressing as told by your health care provider. °· Keep the bite area clean and dry. Wash the bite area daily with soap and water or an antiseptic as told by your health care provider. °· Check your wound every day for signs of infection. Watch for:   °¨  Redness, swelling, or pain that is getting worse.   °¨  Fluid, blood, or pus.   °· If you develop blistering at the site of the bite, protect the blisters from breaking. Do not attempt to open a blister. °Medicines °· Take or apply over-the-counter and prescription medicines only as told by your health care provider.   °· If you were prescribed an antibiotic, take or apply it as told by your health care provider. Do not stop using the antibiotic even if your condition improves. °General Instructions °· Keep the affected area raised (elevated) above the level of your heart while you are sitting   or lying down, if possible. °· Keep all follow-up visits as told by your health care provider. This is important. °SEEK MEDICAL CARE IF:  °· You have increased redness, swelling, or pain at the site of your wound. °· You have fluid, blood, or pus coming from your wound. °· You have a fever. °SEEK IMMEDIATE MEDICAL CARE IF:  °· You develop blood blisters or purple spots in the bite area.   °· You have nausea or vomiting.   °· You have numbness or tingling.   °· You have excessive  sweating.   °· You have trouble breathing.   °· You have vision problems.   °· You feel very confused. °· You feel faint or light-headed.   °  °This information is not intended to replace advice given to you by your health care provider. Make sure you discuss any questions you have with your health care provider. °  °Document Released: 05/23/2000 Document Revised: 02/14/2015 Document Reviewed: 10/11/2014 °Elsevier Interactive Patient Education ©2016 [employer] ° °

## 2015-11-20 NOTE — ED Notes (Signed)
Pt reports understanding of discharge information. No questions at time of discharge 

## 2015-11-20 NOTE — ED Provider Notes (Signed)
CSN: [id number]     Arrival date & time 11/19/15  2143 History   First MD Initiated Contact with Patient 11/19/15 2356     Chief Complaint  Patient presents with  . Snake Bite      HPI  Expand All Collapse All   Pt states that he was bitten by a black snake with a white under belly tonight on his R middle finger tonight. Alert and oriented.        Past Medical History  Diagnosis Date  . Asthma   . Coronary artery disease 1991    a) Angioplasty LAD 1991 [place] b) MI in 1995 at  [facility] in [place], med Rx  . GERD (gastroesophageal reflux disease)   . Hyperlipidemia   . Myocardial infarct (HCC)   . Nephrolithiasis   . Osteopenia   . HTN (hypertension)   . Diabetes mellitus   . Hearing loss    Past Surgical History  Procedure Laterality Date  . Inguinal hernia repair    . Ptca    . Vasectomy    . Orif tibia & fibula fractures    . Angioplasty  1991  . Eye surgery  2010    cataract - right  . Cholecystectomy  2012  . Cardiac catheterization  2005    Moderate LCx and LAD disease and severe diffuse RCA disease  . Cardiac catheterization  01/2012    normal left main, 50% pLAD stenosis, dLAD mild luminal irregularities, D1 30-40% stenosis at take off; subtotally occluded/severely diseased OM1, mid 80% stenosis in small OM2, long prox and mid RCA stenoses ranging from 60-95%, severely diseased PDA, distal PLSA occluded filling via L-R collateral; normal LV function; inferior lateral basal akinesis.  . Left heart catheterization with coronary angiogram N/A 01/28/2012    Procedure: LEFT HEART CATHETERIZATION WITH CORONARY ANGIOGRAM;  Surgeon: Iran Ouch, MD;  Location: [facility] CATH LAB;  Service: Cardiovascular;  Laterality: N/A;  . Left heart catheterization with coronary angiogram N/A 03/17/2014    Procedure: LEFT HEART CATHETERIZATION WITH CORONARY ANGIOGRAM;  Surgeon: Kathleene Hazel, MD;  Location: [facility] CATH LAB;  Service: Cardiovascular;  Laterality: N/A;    Family History  Problem Relation Age of Onset  . Asthma Mother   . Allergies Sister   . Coronary artery disease Other    Social History  Substance Use Topics  . Smoking status: Never Smoker   . Smokeless tobacco: Never Used  . Alcohol Use: 0.0 oz/week    0 Standard drinks or equivalent per week     Comment: occ, once every 2 months    Review of Systems  All other systems reviewed and are negative.     Allergies  Meloxicam  Home Medications   Prior to Admission medications   Medication Sig Start Date End Date Taking? Authorizing Provider  albuterol (PROVENTIL HFA;VENTOLIN HFA) 108 (90 BASE) MCG/ACT inhaler Inhale 2 puffs into the lungs every 4 (four) hours as needed. For shortness of breath    Historical Provider, MD  aspirin EC 81 MG tablet Take 81 mg by mouth daily.    Historical Provider, MD  b complex vitamins capsule Take 1 capsule by mouth daily.    Historical Provider, MD  doxycycline (VIBRA-TABS) 100 MG tablet Take 1 tablet (100 mg total) by mouth 2 (two) times daily. 11/20/15   Nelva Nay, MD  fluticasone (FLONASE) 50 MCG/ACT nasal spray Place 2 sprays into both nostrils daily as needed for allergies. 11/27/14   Theron Arista  Lysle DingwallF Kwiatkowski, MD  Fluticasone-Salmeterol (ADVAIR) 100-50 MCG/DOSE AEPB Inhale 1 puff into the lungs 2 (two) times daily. 10/29/15   Roddie McJulia Kordsmeier, FNP  gabapentin (NEURONTIN) 300 MG capsule Take 900 mg by mouth 3 (three) times daily.  07/28/12   Gordy SaversPeter F Kwiatkowski, MD  glucose blood (PRECISION XTRA TEST STRIPS) test strip USE TO CHECK BLOOD SUGAR DAILY 04/21/14   Gordy SaversPeter F Kwiatkowski, MD  hyoscyamine (LEVSIN SL) 0.125 MG SL tablet Place 1 tablet (0.125 mg total) under the tongue every 4 (four) hours as needed. 11/27/14   Gordy SaversPeter F Kwiatkowski, MD  isosorbide mononitrate (IMDUR) 30 MG 24 hr tablet TAKE ONE TABLET BY MOUTH TWICE DAILY. 03/12/15   Gordy SaversPeter F Kwiatkowski, MD  levothyroxine (SYNTHROID, LEVOTHROID) 25 MCG tablet Take 25 mcg by mouth daily. [facility]/15  Gordy SaversPeter F Kwiatkowski, MD  Melatonin 3 MG CAPS Take 1 capsule by mouth at bedtime.    Historical Provider, MD  metFORMIN (GLUCOPHAGE) 500 MG tablet Take 500 mg by mouth 2 (two) times daily with a meal.    Historical Provider, MD  metoprolol (LOPRESSOR) 50 MG tablet Take 1 tablet (50 mg total) by mouth 2 (two) times daily. 07/06/14   Kathleene Hazelhristopher D McAlhany, MD  nitroGLYCERIN (NITROSTAT) 0.4 MG SL tablet Place 1 tablet (0.4 mg total) under the tongue every 5 (five) minutes as needed. Chest pain 11/21/13   Gordy SaversPeter F Kwiatkowski, MD  nortriptyline (PAMELOR) 25 MG capsule TAKE ONE CAPSULE BY MOUTH AT BEDTIME 05/01/14   Gordy SaversPeter F Kwiatkowski, MD  ramipril (ALTACE) 5 MG capsule Take 1 capsule (5 mg total) by mouth daily. 07/31/15   Gordy SaversPeter F Kwiatkowski, MD  simvastatin (ZOCOR) 80 MG tablet Take 40 mg by mouth at bedtime. [facility]    Historical Provider, MD  tamsulosin (FLOMAX) 0.4 MG CAPS capsule TAKE ONE CAPSULE BY MOUTH DAILY 06/26/14   Gordy SaversPeter F Kwiatkowski, MD  traZODone (DESYREL) 50 MG tablet Take 0.5-1 tablets (25-50 mg total) by mouth at bedtime as needed for sleep. 08/31/15   Gordy SaversPeter F Kwiatkowski, MD   BP 151/79 mmHg  Pulse 92  Temp(Src) 97.8 F (36.6 C) (Oral)  Resp 18  Ht 5\' 11"  (1.803 m)  Wt 205 lb (92.987 kg)  BMI 28.60 kg/m2  SpO2 96% Physical Exam  Constitutional: He is oriented to person, place, and time. He appears well-developed and well-nourished. No distress.  HENT:  Head: Normocephalic and atraumatic.  Eyes: Pupils are equal, round, and reactive to light.  Neck: Normal range of motion.  Cardiovascular: Normal rate and intact distal pulses.   Pulmonary/Chest: No respiratory distress.  Abdominal: Normal appearance. He exhibits no distension.  Musculoskeletal: Normal range of motion.       Hands: Neurological: He is alert and oriented to person, place, and time. No cranial nerve deficit.  Skin: Skin is warm and dry. No rash noted.  Psychiatric: He has  a normal mood and affect. His behavior is normal.  Nursing note and vitals reviewed.   ED Course  Procedures (including critical care time) Labs Review Labs Reviewed - No data to display    Skin still appears to be intact with no signs of envenomation.  Description of snake was that of a small black snake.  MDM   Final diagnoses:  Contact with black snake as cause of accidental injury        Nelva Nayobert Laverle Pillard, MD 11/22/15 [phone]

## 2015-11-27 ENCOUNTER — Encounter: Payer: Self-pay | Admitting: Allergy and Immunology

## 2015-11-27 ENCOUNTER — Ambulatory Visit (INDEPENDENT_AMBULATORY_CARE_PROVIDER_SITE_OTHER): Payer: Medicare Other | Admitting: Allergy and Immunology

## 2015-11-27 VITALS — BP 128/68 | HR 68 | Temp 97.5°F | Resp 16 | Ht 70.0 in | Wt 214.0 lb

## 2015-11-27 DIAGNOSIS — J4541 Moderate persistent asthma with (acute) exacerbation: Secondary | ICD-10-CM

## 2015-11-27 DIAGNOSIS — H1010 Acute atopic conjunctivitis, unspecified eye: Secondary | ICD-10-CM

## 2015-11-27 DIAGNOSIS — K219 Gastro-esophageal reflux disease without esophagitis: Secondary | ICD-10-CM | POA: Diagnosis not present

## 2015-11-27 DIAGNOSIS — J309 Allergic rhinitis, unspecified: Secondary | ICD-10-CM

## 2015-11-27 MED ORDER — BECLOMETHASONE DIPROPIONATE 80 MCG/ACT IN AERS: INHALATION_SPRAY | RESPIRATORY_TRACT | Status: DC

## 2015-11-27 MED ORDER — RANITIDINE HCL 300 MG PO TABS: 300.0000 mg | ORAL_TABLET | Freq: Every day | ORAL | Status: DC

## 2015-11-27 MED ORDER — FLUTICASONE FUROATE-VILANTEROL 200-25 MCG/INH IN AEPB: 1.0000 | INHALATION_SPRAY | Freq: Every day | RESPIRATORY_TRACT | Status: DC

## 2015-11-27 MED ORDER — MONTELUKAST SODIUM 10 MG PO TABS: 10.0000 mg | ORAL_TABLET | Freq: Every day | ORAL | Status: DC

## 2015-11-27 MED ORDER — IPRATROPIUM BROMIDE 0.06 % NA SOLN: NASAL | Status: DC

## 2015-11-27 NOTE — Patient Instructions (Addendum)
  1. Allergen avoidance measures  2. Treat and prevent inflammation:   A. Breo 200 - one inhalation 1 time per day. Replaces Advair  B. Flonase - 2 sprays each nostril one time per day  C. montelukast 10 mg one tablet one time per day  3. If needed:   A. Proventil or Ventolin HFA 2 puffs every 4-6 hours  B. OTC antihistamine - Claritin/Zyrtec/Allegra  C. nasal ipratropium 0.06% 2 sprays each nostril every 6 hours to dry up nose  4. "Action plan" for asthma flare up:   A. continue Breo one time a day  B. add Qvar 80 3 inhalations 3 times a day with spacer  C. use Proventil or Ventolin HFA if needed  5. Obtain annual flu vaccine every year  6. Treat reflux with ranitidine 300 mg once a day  7. Return to clinic in 4 weeks or earlier if problem

## 2015-11-27 NOTE — Progress Notes (Signed)
Dear Dr. Amador Cunas,  Thank you for referring Richard Ho to the [facility] [place] on 11/27/2015.   Below is a summation of this patient's evaluation and recommendations.  Thank you for your referral. I will keep you informed about this patient's response to treatment.   If you have any questions please to do hestitate to contact me.   Sincerely,  Jessica Priest, MD [facility]   ______________________________________________________________________    NEW PATIENT NOTE  Referring Provider: Gordy Savers, MD Primary Provider: Rogelia Boga, MD Date of office visit: 11/27/2015    Subjective:   Chief Complaint:  Richard Ho (DOB: June 16, 1940) is a 75 y.o. male with a chief complaint of Allergies and Asthma  who presents to the clinic on 11/27/2015 with the following problems:  HPI: Richard Ho presents this clinic in evaluation of respiratory tract problems.   Richard Ho has a long history of asthma dating back to childhood. He thinks that his asthma may be a little bit more active recently. He has required two systemic steroids in 2017 and at least two systemic steroids in 2016 to treat his asthma. His most recent flareup was in May and there was no obvious provoking factor giving rise to this issue except possibly pollen exposure. He was treated with a systemic steroid and started on Advair at that point in time. While using Advair he does not need to use a short acting bronchodilator. He thinks that provoking factors giving rise to flares of his asthma include any type of respiratory tract infection, high humidity, and cold air. He does not appear to have a significant exercise induced component.  Richard Ho also has a long history of allergic allergies involving his nose dating back to childhood. He has sneezing and nasal congestion and rhinorrhea and postnasal drip. These symptoms  occur on a perennial basis in a random pattern without any obvious trigger. Most recently he has been developing problems with lots of mouth breathing and decreased ability to smell and taste without any headaches or ugly nasal discharge. He's been given Zyrtec which does help him somewhat.  Richard Ho has a history of gastroesophageal reflux disease and has been treated with a proton pump inhibitor since 1971. Most recently he visited with a nephrologist who discontinued his Protonix about 8 months ago and now he is using Zantac intermittently. He still has reflux up into his throat. He drinks one coffee per day and does not really eat much chocolate or drink alcohol.  Past Medical History  Diagnosis Date  . Asthma   . Coronary artery disease 1991    a) Angioplasty LAD 1991 [place] b) MI in 1995 at  [facility] in [place], med Rx  . GERD (gastroesophageal reflux disease)   . Hyperlipidemia   . Myocardial infarct (HCC)   . Nephrolithiasis   . Osteopenia   . HTN (hypertension)   . Diabetes mellitus   . Hearing loss     Past Surgical History  Procedure Laterality Date  . Inguinal hernia repair    . Ptca    . Vasectomy    . Orif tibia & fibula fractures    . Angioplasty  1991  . Eye surgery  2010    cataract - right  . Cholecystectomy  2012  . Cardiac catheterization  2005    Moderate LCx and LAD disease and severe diffuse RCA disease  . Cardiac catheterization  01/2012    normal left main, 50% pLAD stenosis, dLAD mild luminal irregularities, D1 30-40% stenosis at take off; subtotally occluded/severely diseased OM1, mid 80% stenosis in small OM2, long prox and mid RCA stenoses ranging from 60-95%, severely diseased PDA, distal PLSA occluded filling via L-R collateral; normal LV function; inferior lateral basal akinesis.  . Left heart catheterization with coronary angiogram N/A 01/28/2012    Procedure: LEFT HEART CATHETERIZATION WITH CORONARY ANGIOGRAM;  Surgeon: Iran Ouch, MD;   [facility]: [facility];  Service: Cardiovascular;  Laterality: N/A;  . Left heart catheterization with coronary angiogram N/A 03/17/2014    Procedure: LEFT HEART CATHETERIZATION WITH CORONARY ANGIOGRAM;  Surgeon: Kathleene Hazel, MD;  Location: [facility] CATH LAB;  Service: Cardiovascular;  Laterality: N/A;      Medication List           albuterol 108 (90 Base) MCG/ACT inhaler  Commonly known as:  PROVENTIL HFA;VENTOLIN HFA  Inhale 2 puffs into the lungs every 4 (four) hours as needed. For shortness of breath     aspirin EC 81 MG tablet  Take 81 mg by mouth daily.     b complex vitamins capsule  Take 1 capsule by mouth daily.     doxycycline 100 MG tablet  Commonly known as:  VIBRA-TABS  Take 1 tablet (100 mg total) by mouth 2 (two) times daily.     fluticasone 50 MCG/ACT nasal spray  Commonly known as:  FLONASE  Place 2 sprays into both nostrils daily as needed for allergies.     Fluticasone-Salmeterol 100-50 MCG/DOSE Aepb  Commonly known as:  ADVAIR  Inhale 1 puff into the lungs 2 (two) times daily.     gabapentin 300 MG capsule  Commonly known as:  NEURONTIN  Take 900 mg by mouth 3 (three) times daily.     glucose blood test strip  Commonly known as:  PRECISION XTRA TEST STRIPS  USE TO CHECK BLOOD SUGAR DAILY     hyoscyamine 0.125 MG SL tablet  Commonly known as:  LEVSIN SL  Place 1 tablet (0.125 mg total) under the tongue every 4 (four) hours as needed.     isosorbide mononitrate 30 MG 24 hr tablet  Commonly known as:  IMDUR  TAKE ONE TABLET BY MOUTH TWICE DAILY.     levothyroxine 25 MCG tablet  Commonly known as:  SYNTHROID, LEVOTHROID  Take 25 mcg by mouth daily. Pt getting Rx from [place]     Melatonin 3 MG Caps  Take 1 capsule by mouth at bedtime.     metFORMIN 500 MG tablet  Commonly known as:  GLUCOPHAGE  Take 500 mg by mouth 2 (two) times daily with a meal.     metoprolol 50 MG tablet  Commonly known as:  LOPRESSOR  Take 1 tablet (50 mg total) by mouth  2 (two) times daily.     nitroGLYCERIN 0.4 MG SL tablet  Commonly known as:  NITROSTAT  Place 1 tablet (0.4 mg total) under the tongue every 5 (five) minutes as needed. Chest pain     nortriptyline 25 MG capsule  Commonly known as:  PAMELOR  TAKE ONE CAPSULE BY MOUTH AT BEDTIME     ramipril 5 MG capsule  Commonly known as:  ALTACE  Take 1 capsule (5 mg total) by mouth daily.     simvastatin 80 MG tablet  Commonly known as:  ZOCOR  Take 40 mg by mouth at bedtime. Rx from [place]  tamsulosin 0.4 MG Caps capsule  Commonly known as:  FLOMAX  TAKE ONE CAPSULE BY MOUTH DAILY     traZODone 50 MG tablet  Commonly known as:  DESYREL  Take 0.5-1 tablets (25-50 mg total) by mouth at bedtime as needed for sleep.        Allergies  Allergen Reactions  . Meloxicam Other (See Comments)    "jumping out of my skin" Pt feels anxiety attacks when on this medicine    Review of systems negative except as noted in HPI / PMHx or noted below:  Review of Systems  Constitutional: Negative.   HENT: Negative.   Eyes: Negative.   Respiratory: Negative.   Cardiovascular: Negative.   Gastrointestinal: Negative.   Genitourinary: Negative.   Musculoskeletal: Negative.   Skin: Negative.   Neurological: Negative.   Endo/Heme/Allergies: Negative.   Psychiatric/Behavioral: Negative.     Family History  Problem Relation Age of Onset  . Asthma Mother   . Allergies Sister   . Coronary artery disease Other     Social History   Social History  . Marital Status: Married    Spouse Name: N/A  . Number of Children: N/A  . Years of Education: N/A   Occupational History  . Retired    Social History Main Topics  . Smoking status: Never Smoker   . Smokeless tobacco: Never Used  . Alcohol Use: 0.0 oz/week    0 Standard drinks or equivalent per week     Comment: occ, once every 2 months  . Drug Use: No  . Sexual Activity: Not on file   Other Topics Concern  . Not on file   Social History  Narrative    Environmental and Social history  Lives in a townhouse with a dry environment, no animals located inside the household, carpeting in the bedroom, no plastic on the bed or pillow, and no smokers located inside the household.   Objective:   Filed Vitals:   11/27/15 0906  BP: 128/68  Pulse: 68  Temp: 97.5 F (36.4 C)  Resp: 16   Height: 5\' 10"  (177.8 cm) Weight: 214 lb (97.07 kg)  Physical Exam  Constitutional: He is well-developed, well-nourished, and in no distress.  HENT:  Head: Normocephalic. Head is without right periorbital erythema and without left periorbital erythema.  Right Ear: Tympanic membrane, external ear and ear canal normal.  Left Ear: Tympanic membrane, external ear and ear canal normal.  Nose: Mucosal edema present. No rhinorrhea.  Mouth/Throat: Oropharynx is clear and moist and mucous membranes are normal. No oropharyngeal exudate.  Eyes: Conjunctivae and lids are normal. Pupils are equal, round, and reactive to light.  Neck: Trachea normal. No tracheal deviation present. No thyromegaly present.  Cardiovascular: Normal rate, regular rhythm, S1 normal, S2 normal and normal heart sounds.   No murmur heard. Pulmonary/Chest: Effort normal. No stridor. No tachypnea. No respiratory distress. He has no wheezes. He has no rales. He exhibits no tenderness.  Abdominal: Soft. He exhibits no distension and no mass. There is no hepatosplenomegaly. There is no tenderness. There is no rebound and no guarding.  Musculoskeletal: He exhibits no edema or tenderness.  Lymphadenopathy:       Head (right side): No tonsillar adenopathy present.       Head (left side): No tonsillar adenopathy present.    He has no cervical adenopathy.    He has no axillary adenopathy.  Neurological: He is alert. Gait normal.  Skin: No rash noted. He is not  diaphoretic. No erythema. No pallor. Nails show no clubbing.  Psychiatric: Mood and affect normal.     Diagnostics: Allergy  skin tests were performed. He demonstrated hypersensitivity against molds, house dust mite, grass, weeds, and tree  Spirometry was performed and demonstrated an FEV1 of 2.12 @ 67 % of predicted. Following administration of nebulized albuterol his FEV1 increased to 2.29 which was an increase in the FEV1 of 8%.  The patient had an Asthma Control Test with the following results:  .     Assessment and Plan:    1. Asthma, not well controlled, moderate persistent, with acute exacerbation   2. Allergic rhinoconjunctivitis   3. Gastroesophageal reflux disease, esophagitis presence not specified     1. Allergen avoidance measures  2. Treat and prevent inflammation:   A. Breo 200 - one inhalation 1 time per day. Replaces Advair  B. Flonase - 2 sprays each nostril one time per day  C. montelukast 10 mg one tablet one time per day  3. If needed:   A. Proventil or Ventolin HFA 2 puffs every 4-6 hours  B. OTC antihistamine - Claritin/Zyrtec/Allegra  C. nasal ipratropium 0.06% 2 sprays each nostril every 6 hours to dry up nose  4. "Action plan" for asthma flare up:   A. continue Breo one time a day  B. add Qvar 80 3 inhalations 3 times a day with spacer  C. use Proventil or Ventolin HFA if needed  5. Obtain annual flu vaccine every year  6. Treat reflux with ranitidine 300 mg once a day  7. Return to clinic in 4 weeks or earlier if problem  I will have Richard Ho focus attention on allergen avoidance measures and the consistent use of anti-inflammatory medications for both his upper and lower airways over the course of the next 4 weeks at the same time that he treats his reflux a little bit more aggressively and has the option of using therapy directed against his nonallergic component of rhinitis. Hopefully he will have return of his ability to smell and taste with the therapy mentioned above and he will not require any systemic steroids in the future treatment of his asthma with the plan  mentioned above. If he still continues to have significant problems with his upper and lower airways he'll certainly require further evaluation and treatment. I will regroup with him in 4 weeks or earlier if there is a problem.  Jessica PriestEric J. Kozlow, MD  [facility]

## 2015-12-21 ENCOUNTER — Encounter: Payer: Self-pay | Admitting: Internal Medicine

## 2015-12-21 ENCOUNTER — Ambulatory Visit (INDEPENDENT_AMBULATORY_CARE_PROVIDER_SITE_OTHER): Payer: Medicare Other | Admitting: Internal Medicine

## 2015-12-21 VITALS — BP 142/78 | HR 93 | Temp 97.8°F | Ht 70.75 in | Wt 209.2 lb

## 2015-12-21 DIAGNOSIS — Z0000 Encounter for general adult medical examination without abnormal findings: Secondary | ICD-10-CM | POA: Diagnosis not present

## 2015-12-21 DIAGNOSIS — I10 Essential (primary) hypertension: Secondary | ICD-10-CM

## 2015-12-21 DIAGNOSIS — I2510 Atherosclerotic heart disease of native coronary artery without angina pectoris: Secondary | ICD-10-CM

## 2015-12-21 DIAGNOSIS — E785 Hyperlipidemia, unspecified: Secondary | ICD-10-CM

## 2015-12-21 DIAGNOSIS — E0821 Diabetes mellitus due to underlying condition with diabetic nephropathy: Secondary | ICD-10-CM | POA: Diagnosis not present

## 2015-12-21 DIAGNOSIS — N183 Chronic kidney disease, stage 3 unspecified: Secondary | ICD-10-CM

## 2015-12-21 DIAGNOSIS — E1129 Type 2 diabetes mellitus with other diabetic kidney complication: Secondary | ICD-10-CM | POA: Insufficient documentation

## 2015-12-21 LAB — HEMOGLOBIN A1C: HEMOGLOBIN A1C: 6.7 % — AB (ref 4.6–6.5)

## 2015-12-21 NOTE — Progress Notes (Signed)
Pre visit review using our clinic review tool, if applicable. No additional management support is needed unless otherwise documented below in the visit note. 

## 2015-12-21 NOTE — Progress Notes (Signed)
Subjective:    Patient ID: Richard Ho, male    DOB: 1940-07-22, 75 y.o.   MRN: [id number]  HPI    Subjective:    Patient ID: Richard Ho, male    DOB: 1940/12/06, 75 y.o.   MRN: [id number]  HPI  75 year old patient who is seen today for a preventive health exam.   Medical problems include coronary artery disease, which has been stable.  He has type 2 diabetes complicated by diabetic peripheral neuropathy.  He is on both nortriptyline as well as Neurontin.  He has hypertension and remains on statin therapy.  No cardiopulmonary complaints today.  He is followed at [facility] and underwent esophagogastroduodenoscopy and colonoscopy in 2012.  He's had cataract extraction, surgery on the left in January of 2016.  He is scheduled to see ophthalmology in follow-up next month.  He is followed by cardiology as well as urology  Past Medical History  Diagnosis Date  . Asthma   . Coronary artery disease 1991    a) Angioplasty LAD 1991 [place] b) MI in 1995 at  [facility] in [place], med Rx  . GERD (gastroesophageal reflux disease)   . Hyperlipidemia   . Myocardial infarct (HCC)   . Nephrolithiasis   . Osteopenia   . HTN (hypertension)   . Diabetes mellitus   . Hearing loss     Social History   Social History  . Marital Status: Married    Spouse Name: N/A  . Number of Children: N/A  . Years of Education: N/A   Occupational History  . Retired    Social History Main Topics  . Smoking status: Never Smoker   . Smokeless tobacco: Never Used  . Alcohol Use: 0.0 oz/week    0 Standard drinks or equivalent per week     Comment: occ, once every 2 months  . Drug Use: No  . Sexual Activity: Not on file   Other Topics Concern  . Not on file   Social History Narrative    Past Surgical History  Procedure Laterality Date  . Inguinal hernia repair    . Ptca    . Vasectomy    . Orif tibia & fibula fractures    . Angioplasty  1991  . Eye surgery  2010   cataract - right  . Cholecystectomy  2012  . Cardiac catheterization  2005    Moderate LCx and LAD disease and severe diffuse RCA disease  . Cardiac catheterization  01/2012    normal left main, 50% pLAD stenosis, dLAD mild luminal irregularities, D1 30-40% stenosis at take off; subtotally occluded/severely diseased OM1, mid 80% stenosis in small OM2, long prox and mid RCA stenoses ranging from 60-95%, severely diseased PDA, distal PLSA occluded filling via L-R collateral; normal LV function; inferior lateral basal akinesis.  . Left heart catheterization with coronary angiogram N/A 01/28/2012    Procedure: LEFT HEART CATHETERIZATION WITH CORONARY ANGIOGRAM;  Surgeon: Iran Ouch, MD;  Location: [facility] CATH LAB;  Service: Cardiovascular;  Laterality: N/A;  . Left heart catheterization with coronary angiogram N/A 03/17/2014    Procedure: LEFT HEART CATHETERIZATION WITH CORONARY ANGIOGRAM;  Surgeon: Kathleene Hazel, MD;  Location: [facility] CATH LAB;  Service: Cardiovascular;  Laterality: N/A;    Family History  Problem Relation Age of Onset  . Asthma Mother   . Allergies Sister   . Coronary artery disease Other     Allergies  Allergen Reactions  . Meloxicam Other (See Comments)    "  jumping out of my skin" Pt feels anxiety attacks when on this medicine    Current Outpatient Prescriptions on File Prior to Visit  Medication Sig Dispense Refill  . albuterol (PROVENTIL HFA;VENTOLIN HFA) 108 (90 BASE) MCG/ACT inhaler Inhale 2 puffs into the lungs every 4 (four) hours as needed. For shortness of breath    . aspirin EC 81 MG tablet Take 81 mg by mouth daily.    Marland Kitchen b complex vitamins capsule Take 1 capsule by mouth daily.    . beclomethasone (QVAR) 80 MCG/ACT inhaler Add 3 puffs 3 times daily with asthma flare-up 1 Inhaler 5  . fluticasone (FLONASE) 50 MCG/ACT nasal spray Place 2 sprays into both nostrils daily as needed for allergies. 16 g 5  . fluticasone furoate-vilanterol (BREO ELLIPTA) 200-25  MCG/INH AEPB Inhale 1 puff into the lungs daily. 60 each 5  . gabapentin (NEURONTIN) 300 MG capsule Take 900 mg by mouth 3 (three) times daily.     Marland Kitchen glucose blood (PRECISION XTRA TEST STRIPS) test strip USE TO CHECK BLOOD SUGAR DAILY 100 each 3  . hyoscyamine (LEVSIN SL) 0.125 MG SL tablet Place 1 tablet (0.125 mg total) under the tongue every 4 (four) hours as needed. 90 tablet 5  . ipratropium (ATROVENT) 0.06 % nasal spray Use 2 sprays each nostril every 6 hours as needed for runny nose 15 mL 5  . isosorbide mononitrate (IMDUR) 30 MG 24 hr tablet TAKE ONE TABLET BY MOUTH TWICE DAILY. 180 tablet 3  . Melatonin 3 MG CAPS Take 1 capsule by mouth at bedtime.    . metFORMIN (GLUCOPHAGE) 500 MG tablet Take 500 mg by mouth 2 (two) times daily with a meal.    . metoprolol (LOPRESSOR) 50 MG tablet Take 1 tablet (50 mg total) by mouth 2 (two) times daily. 180 tablet 3  . montelukast (SINGULAIR) 10 MG tablet Take 1 tablet (10 mg total) by mouth at bedtime. 30 tablet 5  . nitroGLYCERIN (NITROSTAT) 0.4 MG SL tablet Place 1 tablet (0.4 mg total) under the tongue every 5 (five) minutes as needed. Chest pain 25 tablet 3  . nortriptyline (PAMELOR) 25 MG capsule TAKE ONE CAPSULE BY MOUTH AT BEDTIME 90 capsule 0  . ramipril (ALTACE) 5 MG capsule Take 1 capsule (5 mg total) by mouth daily. 90 capsule 1  . ranitidine (ZANTAC) 300 MG tablet Take 1 tablet (300 mg total) by mouth at bedtime. 30 tablet 5  . simvastatin (ZOCOR) 80 MG tablet Take 40 mg by mouth at bedtime. Rx from [place]    . tamsulosin (FLOMAX) 0.4 MG CAPS capsule TAKE ONE CAPSULE BY MOUTH DAILY 30 capsule 5  . traZODone (DESYREL) 50 MG tablet Take 0.5-1 tablets (25-50 mg total) by mouth at bedtime as needed for sleep. 30 tablet 3  . levothyroxine (SYNTHROID, LEVOTHROID) 25 MCG tablet Take 25 mcg by mouth daily. Pt getting Rx from [place]     No current facility-administered medications on file prior to visit.    BP 142/78 mmHg  Pulse 93  Temp(Src) 97.8 F  (36.6 C) (Oral)  Ht 5' 10.75" (1.797 m)  Wt 209 lb 3.2 oz (94.892 kg)  BMI 29.39 kg/m2  SpO2 97%    1. Risk factors, based on past  M,S,F history-patient has known cardiac disease.  Status post PCI in the past.  Risk factors include hypertension, diabetes, and dyslipidemia  2.  Physical activities: No restrictions.  Has been in cardiac rehabilitation in the past  3.  Depression/mood:  No history of depression or mood disorder  4.  Hearing: Moderate deficits with tinnitus.  Has  been fitted [facility] aids at the [facility]  5.  ADL's: Totally independent  6.  Fall risk: Low  7.  Home safety: No problems identified  8.  Height weight, and visual acuity; height and weight stable, status post recent left cataract extraction, surgery.  He states his vision is 20/20 on the left and corrected to 20/20 on the right with glasses.  Followed by Dr. Hazle Quant  9.  Counseling: Weight loss modest exercise regimen.  Encouraged  10. Lab orders based on risk factors: Hemoglobin A1c will be reviewed.    11. Referral : Follow cardiology and urology  12. Care plan: Continue heart healthy diet and has had some modest weight loss.  Continued efforts at weight loss.  Weight is down 5 pounds over the past several months   13. Cognitive assessment: Alert and appropriate.  Normal affect.  No cognitive dysfunction  14.  Preventive services will include annual health examinations with screening lab.  He'll continue quarterly hemoglobin A1c evaluations.  He'll be followed periodically by cardiology and will have annual eye examinations performed.  Renal indices will be followed at least twice annually. Patient was provided with a written and personalized care plan  15.  Provider list includes primary care ophthalmology, cardiology as well as urology        Review of Systems  Constitutional: Negative for fever, chills, activity change, appetite change and fatigue.  HENT: Negative for congestion,  dental problem, ear pain, hearing loss, mouth sores, rhinorrhea, sinus pressure, sneezing, tinnitus, trouble swallowing and voice change.   Eyes: Negative for photophobia, pain, redness and visual disturbance.  Respiratory: Negative for apnea, cough, choking, chest tightness, shortness of breath and wheezing.   Cardiovascular: Negative for chest pain, palpitations and leg swelling.  Gastrointestinal: Negative for nausea, vomiting, abdominal pain, diarrhea, constipation, blood in stool, abdominal distention, anal bleeding and rectal pain.  Genitourinary: Negative for dysuria, urgency, frequency, hematuria, flank pain, decreased urine volume, discharge, penile swelling, scrotal swelling, difficulty urinating, genital sores and testicular pain.  Musculoskeletal: Negative for arthralgias, back pain, gait problem, joint swelling, myalgias, neck pain and neck stiffness.  Skin: Negative for color change, rash and wound.  Neurological: Positive for numbness. Negative for dizziness, tremors, seizures, syncope, facial asymmetry, speech difficulty, weakness, light-headedness and headaches.  Hematological: Negative for adenopathy. Does not bruise/bleed easily.  Psychiatric/Behavioral: Negative for suicidal ideas, hallucinations, behavioral problems, confusion, sleep disturbance, self-injury, dysphoric mood, decreased concentration and agitation. The patient is not nervous/anxious.        Objective:   Physical Exam  Constitutional: He appears well-developed and well-nourished.  HENT:  Head: Normocephalic and atraumatic.  Right Ear: External ear normal.  Left Ear: External ear normal.  Nose: Nose normal.  Mouth/Throat: Oropharynx is clear and moist.  Hearing aids in place bilaterally  Eyes: Conjunctivae and EOM are normal. Pupils are equal, round, and reactive to light. No scleral icterus.  Neck: Normal range of motion. Neck supple. No JVD present. No thyromegaly present.  Cardiovascular: Regular rhythm,  normal heart sounds and intact distal pulses.  Exam reveals no gallop and no friction rub.   No murmur heard. Pulmonary/Chest: Effort normal and breath sounds normal. He exhibits no tenderness.  Abdominal: Soft. Bowel sounds are normal. He exhibits no distension and no mass. There is no tenderness.  Genitourinary: Prostate normal and penis normal.  Musculoskeletal: Normal range of motion. He  exhibits no edema and no tenderness.  Lymphadenopathy:    He has no cervical adenopathy.  Neurological: He is alert. He has normal reflexes. No cranial nerve deficit. Coordination normal.  Skin: Skin is warm and dry. No rash noted.  Psychiatric: He has a normal mood and affect. His behavior is normal.          Assessment & Plan:  Preventive health exam Diabetes mellitus.  Appears to be, well-controlled.  We'll check a hemoglobin A1c Coronary artery disease, stable Hypertension stable Dyslipidemia.  Continue statin therapy Diminished auditory acuity      Review of Systems     as above Objective:   Physical Exam  Pulmonary/Chest: He has rales.  Bibasal rales without wheezing.  O2 saturation 96    As above      Assessment & Plan:   Preventive health examination Follow-up cardiology and ophthalmology as scheduled  Weight loss more exercise encouraged No change in medical regimen  Recheck 3 months  [name],[name], MD

## 2015-12-21 NOTE — Patient Instructions (Signed)
Limit your sodium (Salt) intake   Please check your hemoglobin A1c every 3 months    It is important that you exercise regularly, at least 20 minutes 3 to 4 times per week.  If you develop chest pain or shortness of breath seek  medical attention.  You need to lose weight.  Consider a lower calorie diet and regular exercise. 

## 2016-01-02 ENCOUNTER — Encounter: Payer: Self-pay | Admitting: Allergy and Immunology

## 2016-01-02 ENCOUNTER — Ambulatory Visit (INDEPENDENT_AMBULATORY_CARE_PROVIDER_SITE_OTHER): Payer: Medicare Other | Admitting: Allergy and Immunology

## 2016-01-02 VITALS — BP 120/74 | HR 80 | Resp 16

## 2016-01-02 DIAGNOSIS — J309 Allergic rhinitis, unspecified: Secondary | ICD-10-CM

## 2016-01-02 DIAGNOSIS — J4540 Moderate persistent asthma, uncomplicated: Secondary | ICD-10-CM

## 2016-01-02 DIAGNOSIS — H1010 Acute atopic conjunctivitis, unspecified eye: Secondary | ICD-10-CM | POA: Diagnosis not present

## 2016-01-02 DIAGNOSIS — K219 Gastro-esophageal reflux disease without esophagitis: Secondary | ICD-10-CM

## 2016-01-02 NOTE — Patient Instructions (Signed)
  1. Perform Allergen avoidance measures  2. Continue to Treat and prevent inflammation:   A. Breo 200 - one inhalation 1 time per day. Replaces Advair  B. Flonase - 2 sprays each nostril one time per day  C. montelukast 10 mg one tablet one time per day  3. If needed:   A. Proventil or Ventolin HFA 2 puffs every 4-6 hours  B. OTC antihistamine - Claritin/Zyrtec/Allegra  C. nasal ipratropium 0.06% 2 sprays each nostril every 6 hours to dry up nose  4. "Action plan" for asthma flare up:   A. continue Breo one time a day  B. add Qvar 80 3 inhalations 3 times a day with spacer  C. use Proventil or Ventolin HFA if needed  5. Obtain annual flu vaccine every year  6. Treat reflux with ranitidine 300 mg once a day  7. Return to clinic in 8 weeks or earlier if problem

## 2016-01-02 NOTE — Progress Notes (Signed)
Follow-up Note  Referring Provider: Gordy Savers, MD Primary Provider: Rogelia Boga, MD Date of Office Visit: 01/02/2016  Subjective:   Richard Ho (DOB: 10/05/1940) is a 75 y.o. male who [facility] in re-evaluation of the following:  HPI: Kyre returns to this clinic in reevaluation of his asthma and allergic rhinitis and reflux. While utilizing medical therapy established on 11/28/2015 he has done very well. He has no issues with his chest. He no longer needs to use a short acting bronchodilator. He's had no issues with his nose. The persistent drainage that he had from his nose is completely responded to nasal ipratropium. He's had very little issues with his throat. He's not been consistently treating his reflux at this point and he's not relying on the use of ranitidine on a regular basis but is relying on the use of Tums but he thinks that this is actually working relatively well. He continues to use Breo, Flonase, and montelukast consistently.    Medication List      albuterol 108 (90 Base) MCG/ACT inhaler Commonly known as:  PROVENTIL HFA;VENTOLIN HFA Inhale 2 puffs into the lungs every 4 (four) hours as needed. For shortness of breath   aspirin EC 81 MG tablet Take 81 mg by mouth daily.   b complex vitamins capsule Take 1 capsule by mouth daily.   beclomethasone 80 MCG/ACT inhaler Commonly known as:  QVAR Add 3 puffs 3 times daily with asthma flare-up   fluticasone 50 MCG/ACT nasal spray Commonly known as:  FLONASE Place 2 sprays into both nostrils daily as needed for allergies.   fluticasone furoate-vilanterol 200-25 MCG/INH Aepb Commonly known as:  BREO ELLIPTA Inhale 1 puff into the lungs daily.   gabapentin 300 MG capsule Commonly known as:  NEURONTIN Take 900 mg by mouth 3 (three) times daily.   glucose blood test strip Commonly known as:  PRECISION XTRA TEST STRIPS USE TO CHECK  BLOOD SUGAR DAILY   hyoscyamine 0.125 MG SL tablet Commonly known as:  LEVSIN SL Place 1 tablet (0.125 mg total) under the tongue every 4 (four) hours as needed.   ipratropium 0.06 % nasal spray Commonly known as:  ATROVENT Use 2 sprays each nostril every 6 hours as needed for runny nose   isosorbide mononitrate 30 MG 24 hr tablet Commonly known as:  IMDUR TAKE ONE TABLET BY MOUTH TWICE DAILY.   levothyroxine 25 MCG tablet Commonly known as:  SYNTHROID, LEVOTHROID Take 25 mcg by mouth daily. Pt getting Rx from [place]   Melatonin 3 MG Caps Take 1 capsule by mouth at bedtime.   metFORMIN 500 MG tablet Commonly known as:  GLUCOPHAGE Take 500 mg by mouth 2 (two) times daily with a meal.   metoprolol 50 MG tablet Commonly known as:  LOPRESSOR Take 1 tablet (50 mg total) by mouth 2 (two) times daily.   montelukast 10 MG tablet Commonly known as:  SINGULAIR Take 1 tablet (10 mg total) by mouth at bedtime.   nitroGLYCERIN 0.4 MG SL tablet Commonly known as:  NITROSTAT Place 1 tablet (0.4 mg total) under the tongue every 5 (five) minutes as needed. Chest pain   nortriptyline 25 MG capsule Commonly known as:  PAMELOR TAKE ONE CAPSULE BY MOUTH AT BEDTIME   ramipril 5 MG capsule Commonly known as:  ALTACE Take 1 capsule (5 mg total) by mouth daily.   ranitidine 300 MG tablet Commonly known as:  ZANTAC  Take 1 tablet (300 mg total) by mouth at bedtime.   simvastatin 80 MG tablet Commonly known as:  ZOCOR Take 40 mg by mouth at bedtime. Rx from [place]   tamsulosin 0.4 MG Caps capsule Commonly known as:  FLOMAX TAKE ONE CAPSULE BY MOUTH DAILY   traZODone 50 MG tablet Commonly known as:  DESYREL Take 0.5-1 tablets (25-50 mg total) by mouth at bedtime as needed for sleep.       Past Medical History:  Diagnosis Date  . Asthma   . Coronary artery disease 1991   a) Angioplasty LAD 1991 [place] b) MI in 1995 at  [facility] in [place], med Rx  . Diabetes mellitus   .  GERD (gastroesophageal reflux disease)   . Hearing loss   . HTN (hypertension)   . Hyperlipidemia   . Myocardial infarct (HCC)   . Nephrolithiasis   . Osteopenia     Past Surgical History:  Procedure Laterality Date  . ANGIOPLASTY  1991  . CARDIAC CATHETERIZATION  2005   Moderate LCx and LAD disease and severe diffuse RCA disease  . CARDIAC CATHETERIZATION  01/2012   normal left main, 50% pLAD stenosis, dLAD mild luminal irregularities, D1 30-40% stenosis at take off; subtotally occluded/severely diseased OM1, mid 80% stenosis in small OM2, long prox and mid RCA stenoses ranging from 60-95%, severely diseased PDA, distal PLSA occluded filling via L-R collateral; normal LV function; inferior lateral basal akinesis.  . CHOLECYSTECTOMY  2012  . EYE SURGERY  2010   cataract - right  . INGUINAL HERNIA REPAIR    . LEFT HEART CATHETERIZATION WITH CORONARY ANGIOGRAM N/A 01/28/2012   Procedure: LEFT HEART CATHETERIZATION WITH CORONARY ANGIOGRAM;  Surgeon: Iran Ouch, MD;  Location: [facility] CATH LAB;  Service: Cardiovascular;  Laterality: N/A;  . LEFT HEART CATHETERIZATION WITH CORONARY ANGIOGRAM N/A 03/17/2014   Procedure: LEFT HEART CATHETERIZATION WITH CORONARY ANGIOGRAM;  Surgeon: Kathleene Hazel, MD;  Location: [facility] CATH LAB;  Service: Cardiovascular;  Laterality: N/A;  . ORIF TIBIA & FIBULA FRACTURES    . PTCA    . VASECTOMY      Allergies  Allergen Reactions  . Meloxicam Other (See Comments)    "jumping out of my skin" Pt feels anxiety attacks when on this medicine    Review of systems negative except as noted in HPI / PMHx or noted below:  Review of Systems  Constitutional: Negative.   HENT: Negative.   Eyes: Negative.   Respiratory: Negative.   Cardiovascular: Negative.   Gastrointestinal: Negative.   Genitourinary: Negative.   Musculoskeletal: Negative.   Skin: Negative.   Neurological: Negative.   Endo/Heme/Allergies: Negative.   Psychiatric/Behavioral: Negative.       Objective:   Vitals:   01/02/16 1050  BP: 120/74  Pulse: 80  Resp: 16          Physical Exam  Constitutional: He is well-developed, well-nourished, and in no distress.  HENT:  Head: Normocephalic.  Right Ear: Tympanic membrane, external ear and ear canal normal.  Left Ear: Tympanic membrane, external ear and ear canal normal.  Nose: Nose normal. No mucosal edema or rhinorrhea.  Mouth/Throat: Uvula is midline, oropharynx is clear and moist and mucous membranes are normal. No oropharyngeal exudate.  Bilateral hearing aids  Eyes: Conjunctivae are normal.  Neck: Trachea normal. No tracheal tenderness present. No tracheal deviation present. No thyromegaly present.  Cardiovascular: Normal rate, regular rhythm, S1 normal, S2 normal and normal heart sounds.   No  murmur heard. Pulmonary/Chest: Breath sounds normal. No stridor. No respiratory distress. He has no wheezes. He has no rales.  Musculoskeletal: He exhibits no edema.  Lymphadenopathy:       Head (right side): No tonsillar adenopathy present.       Head (left side): No tonsillar adenopathy present.    He has no cervical adenopathy.  Neurological: He is alert. Gait normal.  Skin: No rash noted. He is not diaphoretic. No erythema. Nails show no clubbing.  Psychiatric: Mood and affect normal.    Diagnostics:    Spirometry was performed and demonstrated an FEV1 of 2.03 at 65 % of predicted.  The patient had an Asthma Control Test with the following results: ACT Total Score: 24.    Assessment and Plan:   1. Asthma, moderate persistent, well-controlled   2. Allergic rhinoconjunctivitis   3. Gastroesophageal reflux disease, esophagitis presence not specified     1. Perform Allergen avoidance measures  2. Continue to Treat and prevent inflammation:   A. Breo 200 - one inhalation 1 time per day.   B. Flonase - 2 sprays each nostril one time per day  C. montelukast 10 mg one tablet one time per day  3. If  needed:   A. Proventil or Ventolin HFA 2 puffs every 4-6 hours  B. OTC antihistamine - Claritin/Zyrtec/Allegra  C. nasal ipratropium 0.06% 2 sprays each nostril every 6 hours to dry up nose  4. "Action plan" for asthma flare up:   A. continue Breo one time a day  B. add Qvar 80 3 inhalations 3 times a day with spacer  C. use Proventil or Ventolin HFA if needed  5. Obtain annual flu vaccine every year  6. Treat reflux with ranitidine 300 mg once a day  7. Return to clinic in 8 weeks or earlier if problem  Overall Lawrence's done relatively well and we'll continue to have him use anti-inflammatory medications for a full 12 weeks prior to deciding about manipulation of this treatment. Thus, I will see him back in this clinic in 8 weeks. I did encourage him to treat his reflux a little more aggressively as there is no doubt that he has a significant issue with reflux. I once again made a recommendation that he attempt to taper down his caffeine somewhat and uses ranitidine. In addition, I did once again reinforce the need for him to perform allergen avoidance measures directed against house dust mite as that has not been performed to date. He'll contact me during the interval should there be a significant problem but otherwise I'll see him in 8 weeks.  Laurette Schimke, MD [facility]

## 2016-01-31 ENCOUNTER — Other Ambulatory Visit: Payer: Self-pay | Admitting: Internal Medicine

## 2016-02-05 ENCOUNTER — Other Ambulatory Visit: Payer: Self-pay | Admitting: Internal Medicine

## 2016-02-05 NOTE — Telephone Encounter (Signed)
Rx refill sent to pharmacy. 

## 2016-02-21 DIAGNOSIS — Z23 Encounter for immunization: Secondary | ICD-10-CM | POA: Diagnosis not present

## 2016-02-27 ENCOUNTER — Encounter: Payer: Self-pay | Admitting: Allergy and Immunology

## 2016-02-27 ENCOUNTER — Ambulatory Visit (INDEPENDENT_AMBULATORY_CARE_PROVIDER_SITE_OTHER): Payer: Medicare Other | Admitting: Allergy and Immunology

## 2016-02-27 VITALS — BP 128/70 | HR 88 | Resp 20

## 2016-02-27 DIAGNOSIS — K219 Gastro-esophageal reflux disease without esophagitis: Secondary | ICD-10-CM | POA: Diagnosis not present

## 2016-02-27 DIAGNOSIS — J4540 Moderate persistent asthma, uncomplicated: Secondary | ICD-10-CM

## 2016-02-27 DIAGNOSIS — J3089 Other allergic rhinitis: Secondary | ICD-10-CM | POA: Diagnosis not present

## 2016-02-27 MED ORDER — RANITIDINE HCL 300 MG PO TABS: 300.0000 mg | ORAL_TABLET | Freq: Every day | ORAL | 5 refills | Status: DC

## 2016-02-27 NOTE — Patient Instructions (Signed)
  1. Continue to Perform Allergen avoidance measures  2. Continue to Treat and prevent inflammation:   A. Breo 200 - one inhalation 1 time per day. Replaces Advair  B. Flonase - 2 sprays each nostril one time per day  C. montelukast 10 mg one tablet one time per day  3. If needed:   A. Proventil or Ventolin HFA 2 puffs every 4-6 hours  B. OTC antihistamine - Claritin/Zyrtec/Allegra  C. nasal ipratropium 0.06% 2 sprays each nostril every 6 hours to dry up nose  4. "Action plan" for asthma flare up:   A. continue Breo one time a day  B. add Qvar 80 3 inhalations 3 times a day with spacer  C. use Proventil or Ventolin HFA if needed  5. Can treat reflux with ranitidine 300 mg once a day if needed  6. Return to clinic in 6 months or earlier if problem

## 2016-02-27 NOTE — Progress Notes (Signed)
Follow-up Note  Referring Provider: Gordy SaversKwiatkowski, Peter F, MD Primary Provider: [name],[name], MD Date of Office Visit: 02/27/2016  Subjective:   Richard Ho (DOB: 24-Mar-1941) is a 75 y.o. male who [facility]2016 in re-evaluation of the following:  HPI: [name] returns to this clinic in reevaluation of his asthma, allergic rhinoconjunctivitis, and reflux. I last saw him in this clinic on 01/02/2016.  During his 12 weeks of therapy he has had an excellent response to medical treatment and has basally resolved all his respiratory tract symptoms including his wheezing and coughing and shortness of breath and he has no need to use a short acting bronchodilator and he has stopped the persistent drainage from his nose and no longer has any nasal congestion while consistently using a combination of Breo, Flonase, and montelukast. He also uses nasal ipratropium the dry up his nose which is working quite well. He has not had to activate an action plan for an asthma flare.  He is not treating his reflux but he has no reflux problems at this point in time and only relies on the use of Tums a few times a month. He has tapered down his caffeine consumption to 1 coffee per day.    Medication List      albuterol 108 (90 Base) MCG/ACT inhaler Commonly known as:  PROVENTIL HFA;VENTOLIN HFA Inhale 2 puffs into the lungs every 4 (four) hours as needed. For shortness of breath   aspirin EC 81 MG tablet Take 81 mg by mouth daily.   b complex vitamins capsule Take 1 capsule by mouth daily.   beclomethasone 80 MCG/ACT inhaler Commonly known as:  QVAR Add 3 puffs 3 times daily with asthma flare-up   fluticasone 50 MCG/ACT nasal spray Commonly known as:  FLONASE Place 2 sprays into both nostrils daily as needed for allergies.   fluticasone furoate-vilanterol 200-25 MCG/INH Aepb Commonly known as:  BREO ELLIPTA Inhale 1 puff into the lungs  daily.   gabapentin 300 MG capsule Commonly known as:  NEURONTIN Take 900 mg by mouth 3 (three) times daily.   glucose blood test strip Commonly known as:  PRECISION XTRA TEST STRIPS USE TO CHECK BLOOD SUGAR DAILY   ipratropium 0.06 % nasal spray Commonly known as:  ATROVENT Use 2 sprays each nostril every 6 hours as needed for runny nose   isosorbide mononitrate 30 MG 24 hr tablet Commonly known as:  IMDUR TAKE ONE TABLET BY MOUTH TWICE DAILY.   levothyroxine 25 MCG tablet Commonly known as:  SYNTHROID, LEVOTHROID Take 25 mcg by mouth daily. [place]   Melatonin 3 MG Caps Take 1 capsule by mouth at bedtime.   metFORMIN 500 MG tablet Commonly known as:  GLUCOPHAGE Take 500 mg by mouth 2 (two) times daily with a meal.   metoprolol 50 MG tablet Commonly known as:  LOPRESSOR Take 1 tablet (50 mg total) by mouth 2 (two) times daily.   montelukast 10 MG tablet Commonly known as:  SINGULAIR Take 1 tablet (10 mg total) by mouth at bedtime.   nitroGLYCERIN 0.4 MG SL tablet Commonly known as:  NITROSTAT Place 1 tablet (0.4 mg total) under the tongue every 5 (five) minutes as needed. Chest pain   nortriptyline 25 MG capsule Commonly known as:  PAMELOR TAKE ONE CAPSULE BY MOUTH AT BEDTIME   ramipril 5 MG capsule Commonly known as:  ALTACE TAKE ONE CAPSULE BY MOUTH ONCE  DAILY   simvastatin 80 MG tablet Commonly known as:  ZOCOR Take 40 mg by mouth at bedtime. Rx from [place]   tamsulosin 0.4 MG Caps capsule Commonly known as:  FLOMAX TAKE ONE CAPSULE BY MOUTH DAILY   traZODone 50 MG tablet Commonly known as:  DESYREL TAKE ONE-HALF TO ONE TABLET BY MOUTH AT BEDTIME AS NEEDED FOR SLEEP       Past Medical History:  Diagnosis Date  . Asthma   . Coronary artery disease 1991   a) Angioplasty LAD 1991 [place] b) MI in 1995 at  [facility] in [place], med Rx  . Diabetes mellitus   . GERD (gastroesophageal reflux disease)   . Hearing loss   . HTN  (hypertension)   . Hyperlipidemia   . Myocardial infarct (HCC)   . Nephrolithiasis   . Osteopenia   . Snake bite     Past Surgical History:  Procedure Laterality Date  . ANGIOPLASTY  1991  . CARDIAC CATHETERIZATION  2005   Moderate LCx and LAD disease and severe diffuse RCA disease  . CARDIAC CATHETERIZATION  01/2012   normal left main, 50% pLAD stenosis, dLAD mild luminal irregularities, D1 30-40% stenosis at take off; subtotally occluded/severely diseased OM1, mid 80% stenosis in small OM2, long prox and mid RCA stenoses ranging from 60-95%, severely diseased PDA, distal PLSA occluded filling via L-R collateral; normal LV function; inferior lateral basal akinesis.  . CHOLECYSTECTOMY  2012  . EYE SURGERY  2010   cataract - right  . INGUINAL HERNIA REPAIR    . LEFT HEART CATHETERIZATION WITH CORONARY ANGIOGRAM N/A 01/28/2012   Procedure: LEFT HEART CATHETERIZATION WITH CORONARY ANGIOGRAM;  Surgeon: Iran Ouch, MD;  Location: [facility] CATH LAB;  Service: Cardiovascular;  Laterality: N/A;  . LEFT HEART CATHETERIZATION WITH CORONARY ANGIOGRAM N/A 03/17/2014   Procedure: LEFT HEART CATHETERIZATION WITH CORONARY ANGIOGRAM;  Surgeon: Kathleene Hazel, MD;  Location: [facility] CATH LAB;  Service: Cardiovascular;  Laterality: N/A;  . ORIF TIBIA & FIBULA FRACTURES    . PTCA    . VASECTOMY      Allergies  Allergen Reactions  . Meloxicam Other (See Comments)    "jumping out of my skin" Pt feels anxiety attacks when on this medicine    Review of systems negative except as noted in HPI / PMHx or noted below:  Review of Systems  Constitutional: Negative.   HENT: Negative.   Eyes: Negative.   Respiratory: Negative.   Cardiovascular: Negative.   Gastrointestinal: Negative.   Genitourinary: Negative.   Musculoskeletal: Negative.   Skin: Negative.   Neurological: Negative.   Endo/Heme/Allergies: Negative.   Psychiatric/Behavioral: Negative.      Objective:   Vitals:   02/27/16 1221    BP: 128/70  Pulse: 88  Resp: 20          Physical Exam  Constitutional: He is well-developed, well-nourished, and in no distress.  HENT:  Head: Normocephalic.  Right Ear: Tympanic membrane, external ear and ear canal normal.  Left Ear: Tympanic membrane, external ear and ear canal normal.  Nose: Nose normal. No mucosal edema or rhinorrhea.  Mouth/Throat: Uvula is midline, oropharynx is clear and moist and mucous membranes are normal. No oropharyngeal exudate.  Eyes: Conjunctivae are normal.  Neck: Trachea normal. No tracheal tenderness present. No tracheal deviation present. No thyromegaly present.  Cardiovascular: Normal rate, regular rhythm, S1 normal, S2 normal and normal heart sounds.   No murmur heard. Pulmonary/Chest: Breath sounds normal. No stridor.  No respiratory distress. He has no wheezes. He has no rales.  Musculoskeletal: He exhibits no edema.  Lymphadenopathy:       Head (right side): No tonsillar adenopathy present.       Head (left side): No tonsillar adenopathy present.    He has no cervical adenopathy.  Neurological: He is alert. Gait normal.  Skin: No rash noted. He is not diaphoretic. No erythema. Nails show no clubbing.  Psychiatric: Mood and affect normal.    Diagnostics:    Spirometry was performed and demonstrated an FEV1 of 2.19 at 69 % of predicted.    Assessment and Plan:   1. Asthma, moderate persistent, well-controlled   2. Other allergic rhinitis   3. Gastroesophageal reflux disease, esophagitis presence not specified     1. Continue to Perform Allergen avoidance measures  2. Continue to Treat and prevent inflammation:   A. Breo 200 - one inhalation 1 time per day. Replaces Advair  B. Flonase - 2 sprays each nostril one time per day  C. montelukast 10 mg one tablet one time per day  3. If needed:   A. Proventil or Ventolin HFA 2 puffs every 4-6 hours  B. OTC antihistamine - Claritin/Zyrtec/Allegra  C. nasal ipratropium 0.06% 2  sprays each nostril every 6 hours to dry up nose  4. "Action plan" for asthma flare up:   A. continue Breo one time a day  B. add Qvar 80 3 inhalations 3 times a day with spacer  C. use Proventil or Ventolin HFA if needed  5. Can treat reflux with ranitidine 300 mg once a day if needed  6. Return to clinic in 6 months or earlier if problem  Jeryn is really doing quite well at this point in time and we will continue to have him use a combination of Breo and Flonase and montelukast on a consistent basis and assume that he'll continue to do well and see him back in this clinic in 6 months or earlier if there is a problem. Of course, he has a bunch of other medications that he can use as needed as specified above. He has already received the flu vaccine this year.  Laurette Schimke, MD [facility]

## 2016-03-15 ENCOUNTER — Other Ambulatory Visit: Payer: Self-pay | Admitting: Internal Medicine

## 2016-03-25 ENCOUNTER — Ambulatory Visit (INDEPENDENT_AMBULATORY_CARE_PROVIDER_SITE_OTHER): Payer: Medicare Other | Admitting: Internal Medicine

## 2016-03-25 ENCOUNTER — Encounter: Payer: Self-pay | Admitting: Internal Medicine

## 2016-03-25 VITALS — BP 118/70 | HR 75 | Temp 97.9°F | Resp 20 | Ht 70.75 in | Wt 212.0 lb

## 2016-03-25 DIAGNOSIS — I10 Essential (primary) hypertension: Secondary | ICD-10-CM

## 2016-03-25 DIAGNOSIS — E1121 Type 2 diabetes mellitus with diabetic nephropathy: Secondary | ICD-10-CM | POA: Diagnosis not present

## 2016-03-25 LAB — HEMOGLOBIN A1C: Hgb A1c MFr Bld: 6.9 % — ABNORMAL HIGH (ref 4.6–6.5)

## 2016-03-25 NOTE — Patient Instructions (Addendum)
Diphenhydramine capsules or tablets [Insomnia] What is this medicine? DIPHENHYDRAMINE (dye fen HYE dra meen) is an antihistamine. This medicine is used to treat occasional sleeplesness. This medicine may be used for other purposes; ask your health care provider or pharmacist if you have questions. What should I tell my health care provider before I take this medicine? They need to know if you have any of these conditions: -glaucoma -high blood pressure or heart disease -liver disease -lung or breathing disease, like asthma -pain or trouble passing urine -prostate trouble -ulcers or other stomach problems -an unusual or allergic reaction to diphenhydramine, other medicines foods, dyes, or preservatives such as sulfites -pregnant or trying to get pregnant -breast-feeding How should I use this medicine? Take this medicine by mouth with a full glass of water. Follow the directions on the prescription label. Do not take your medicine more often than directed. Talk to your pediatrician regarding the use of this medicine in children. While this drug may be prescribed for children as young as 45 years old for selected conditions, precautions do apply. Patients over 49 years old may have a stronger reaction and need a smaller dose. Overdosage: If you think you have taken too much of this medicine contact a poison control center or emergency room at once. NOTE: This medicine is only for you. Do not share this medicine with others. What if I miss a dose? If you miss a dose, take it as soon as you can. If it is almost time for your next dose, take only that dose. Do not take double or extra doses. What may interact with this medicine? Do not take this medicine with any of the following medications: -MAOIs like Carbex, Eldepryl, Marplan, Nardil, and Parnate This medicine may also interact with the following medications: -alcohol -barbiturates like phenobarbital -medicines for bladder spasm like  oxybutynin, tolterodine -medicines for blood pressure -medicines for depression, anxiety, or psychotic disturbances -medicines for movement abnormalities or Parkinson's disease -medicines for sleep -other medicines for cold, cough, or allergy -some medicines for the stomach like chlordiazepoxide, dicyclomine This list may not describe all possible interactions. Give your health care provider a list of all the medicines, herbs, non-prescription drugs, or dietary supplements you use. Also tell them if you smoke, drink alcohol, or use illegal drugs. Some items may interact with your medicine. What should I watch for while using this medicine? Visit your doctor or health care professional for regular check ups. Tell your doctor or health care professional if your symptoms do not start to get better or if they get worse. Your mouth may get dry. Chewing sugarless gum or sucking hard candy, and drinking plenty of water may help. Contact your doctor if the problem does not go away or is severe. This medicine may cause dry eyes and blurred vision. If you wear contact lenses you may feel some discomfort. Lubricating drops may help. See your eye doctor if the problem does not go away or is severe. You may get drowsy or dizzy. Do not drive, use machinery, or do anything that needs mental alertness until you know how this medicine affects you. Do not stand or sit up quickly, especially if you are an older patient. This reduces the risk of dizzy or fainting spells. Alcohol may interfere with the effect of this medicine. Avoid alcoholic drinks. What side effects may I notice from receiving this medicine? Side effects that you should report to your doctor or health care professional as soon as possible: -allergic  reactions like skin rash, itching or hives, swelling of the face, lips, or tongue -changes in vision -confused, agitated, or nervous -fast, irregular heartbeat -tremor -trouble passing urine or change  in the amount of urine -unusual bleeding or bruising -unusually weak or tired Side effects that usually do not require medical attention (report to your doctor or health care professional if they continue or are bothersome): -constipation, diarrhea -drowsy -headache -loss of appetite -stomach upset, vomiting -thick mucus This list may not describe all possible side effects. Call your doctor for medical advice about side effects. You may report side effects to FDA at 1-800-FDA-1088. Where should I keep my medicine? Keep out of the reach of children. Store at room temperature between 20 and 25 degrees C (68 and 77 degrees F). Keep container closed tightly. Throw away any unused medicine after the expiration date. NOTE: This sheet is a summary. It may not cover all possible information. If you have questions about this medicine, talk to your doctor, pharmacist, or health care provider.    2016, Elsevier/Gold Standard. (2007-09-24 16:14:00)   Limit your sodium (Salt) intake    It is important that you exercise regularly, at least 20 minutes 3 to 4 times per week.  If you develop chest pain or shortness of breath seek  medical attention.  You need to lose weight.  Consider a lower calorie diet and regular exercise.   Please check your hemoglobin A1c every 3-6  monthsInsomnia Insomnia is a sleep disorder that makes it difficult to fall asleep or to stay asleep. Insomnia can cause tiredness (fatigue), low energy, difficulty concentrating, mood swings, and poor performance at work or school.  There are three different ways to classify insomnia:  Difficulty falling asleep.  Difficulty staying asleep.  Waking up too early in the morning. Any type of insomnia can be long-term (chronic) or short-term (acute). Both are common. Short-term insomnia usually lasts for three months or less. Chronic insomnia occurs at least three times a week for longer than three months. CAUSES  Insomnia may be  caused by another condition, situation, or substance, such as:  Anxiety.  Certain medicines.  Gastroesophageal reflux disease (GERD) or other gastrointestinal conditions.  Asthma or other breathing conditions.  Restless legs syndrome, sleep apnea, or other sleep disorders.  Chronic pain.  Menopause. This may include hot flashes.  Stroke.  Abuse of alcohol, tobacco, or illegal drugs.  Depression.  Caffeine.   Neurological disorders, such as Alzheimer disease.  An overactive thyroid (hyperthyroidism). The cause of insomnia may not be known. RISK FACTORS Risk factors for insomnia include:  Gender. Women are more commonly affected than men.  Age. Insomnia is more common as you get older.  Stress. This may involve your professional or personal life.  Income. Insomnia is more common in people with lower income.  Lack of exercise.   Irregular work schedule or night shifts.  Traveling between different time zones. SIGNS AND SYMPTOMS If you have insomnia, trouble falling asleep or trouble staying asleep is the main symptom. This may lead to other symptoms, such as:  Feeling fatigued.  Feeling nervous about going to sleep.  Not feeling rested in the morning.  Having trouble concentrating.  Feeling irritable, anxious, or depressed. TREATMENT  Treatment for insomnia depends on the cause. If your insomnia is caused by an underlying condition, treatment will focus on addressing the condition. Treatment may also include:   Medicines to help you sleep.  Counseling or therapy.  Lifestyle adjustments. HOME CARE  INSTRUCTIONS   Take medicines only as directed by your health care provider.  Keep regular sleeping and waking hours. Avoid naps.  Keep a sleep diary to help you and your health care provider figure out what could be causing your insomnia. Include:   When you sleep.  When you wake up during the night.  How well you sleep.   How rested you feel  the next day.  Any side effects of medicines you are taking.  What you eat and drink.   Make your bedroom a comfortable place where it is easy to fall asleep:  Put up shades or special blackout curtains to block light from outside.  Use a white noise machine to block noise.  Keep the temperature cool.   Exercise regularly as directed by your health care provider. Avoid exercising right before bedtime.  Use relaxation techniques to manage stress. Ask your health care provider to suggest some techniques that may work well for you. These may include:  Breathing exercises.  Routines to release muscle tension.  Visualizing peaceful scenes.  Cut back on alcohol, caffeinated beverages, and cigarettes, especially close to bedtime. These can disrupt your sleep.  Do not overeat or eat spicy foods right before bedtime. This can lead to digestive discomfort that can make it hard for you to sleep.  Limit screen use before bedtime. This includes:  Watching TV.  Using your smartphone, tablet, and computer.  Stick to a routine. This can help you fall asleep faster. Try to do a quiet activity, brush your teeth, and go to bed at the same time each night.  Get out of bed if you are still awake after 15 minutes of trying to sleep. Keep the lights down, but try reading or doing a quiet activity. When you feel sleepy, go back to bed.  Make sure that you drive carefully. Avoid driving if you feel very sleepy.  Keep all follow-up appointments as directed by your health care provider. This is important. SEEK MEDICAL CARE IF:   You are tired throughout the day or have trouble in your daily routine due to sleepiness.  You continue to have sleep problems or your sleep problems get worse. SEEK IMMEDIATE MEDICAL CARE IF:   You have serious thoughts about hurting yourself or someone else.   This information is not intended to replace advice given to you by your health care provider. Make sure  you discuss any questions you have with your health care provider.   Document Released: 05/23/2000 Document Revised: 02/14/2015 Document Reviewed: 02/24/2014 Elsevier Interactive Patient Education [employer]2016 Elsevier Inc.

## 2016-03-25 NOTE — Progress Notes (Signed)
Subjective:    Patient ID: Richard Ho, male    DOB: 28-Aug-1940, 75 y.o.   MRN: [id number]  HPI  75 year old patient who is seen today for follow-up.  He has type 2 diabetes, CAD by chronic kidney disease as well as peripheral neuropathy.  Doing quite well.  He has been seen by allergy/pulmonary medicine and his asthma has been much better controlled. Diabetes is controlled with submaximal dose metformin therapy only.  Renal indices have been stable. Denies any cardiopulmonary complaints.  Past Medical History:  Diagnosis Date  . Asthma   . Coronary artery disease 1991   a) Angioplasty LAD 1991 [place] b) MI in 1995 at  [facility] in [place], med Rx  . Diabetes mellitus   . GERD (gastroesophageal reflux disease)   . Hearing loss   . HTN (hypertension)   . Hyperlipidemia   . Myocardial infarct   . Nephrolithiasis   . Osteopenia   . Snake bite      Social History   Social History  . Marital status: Married    Spouse name: N/A  . Number of children: N/A  . Years of education: N/A   Occupational History  . Retired    Social History Main Topics  . Smoking status: Never Smoker  . Smokeless tobacco: Never Used  . Alcohol use 0.0 oz/week     Comment: occ, once every 2 months  . Drug use: No  . Sexual activity: Not on file   Other Topics Concern  . Not on file   Social History Narrative  . No narrative on file    Past Surgical History:  Procedure Laterality Date  . ANGIOPLASTY  1991  . CARDIAC CATHETERIZATION  2005   Moderate LCx and LAD disease and severe diffuse RCA disease  . CARDIAC CATHETERIZATION  01/2012   normal left main, 50% pLAD stenosis, dLAD mild luminal irregularities, D1 30-40% stenosis at take off; subtotally occluded/severely diseased OM1, mid 80% stenosis in small OM2, long prox and mid RCA stenoses ranging from 60-95%, severely diseased PDA, distal PLSA occluded filling via L-R collateral; normal LV function; inferior lateral basal  akinesis.  . CHOLECYSTECTOMY  2012  . EYE SURGERY  2010   cataract - right  . INGUINAL HERNIA REPAIR    . LEFT HEART CATHETERIZATION WITH CORONARY ANGIOGRAM N/A 01/28/2012   Procedure: LEFT HEART CATHETERIZATION WITH CORONARY ANGIOGRAM;  Surgeon: Iran OuchMuhammad A Arida, MD;  Location: [facility] CATH LAB;  Service: Cardiovascular;  Laterality: N/A;  . LEFT HEART CATHETERIZATION WITH CORONARY ANGIOGRAM N/A 03/17/2014   Procedure: LEFT HEART CATHETERIZATION WITH CORONARY ANGIOGRAM;  Surgeon: Kathleene Hazelhristopher D McAlhany, MD;  [facility];  Service: Cardiovascular;  Laterality: N/A;  . ORIF TIBIA & FIBULA FRACTURES    . PTCA    . VASECTOMY      Family History  Problem Relation Age of Onset  . Asthma Mother   . Allergies Sister   . Coronary artery disease Other     Allergies  Allergen Reactions  . Meloxicam Other (See Comments)    "jumping out of my skin" Pt feels anxiety attacks when on this medicine    Current Outpatient Prescriptions on File Prior to Visit  Medication Sig Dispense Refill  . albuterol (PROVENTIL HFA;VENTOLIN HFA) 108 (90 BASE) MCG/ACT inhaler Inhale 2 puffs into the lungs every 4 (four) hours as needed. For shortness of breath    . aspirin EC 81 MG tablet Take 81 mg by mouth daily.    .Marland Kitchen  b complex vitamins capsule Take 1 capsule by mouth daily.    . beclomethasone (QVAR) 80 MCG/ACT inhaler Add 3 puffs 3 times daily with asthma flare-up 1 Inhaler 5  . fluticasone (FLONASE) 50 MCG/ACT nasal spray USE TWO SPRAY(S) IN EACH NOSTRIL ONCE DAILY AS NEEDED FOR  ALLERGIES. 16 g 5  . fluticasone furoate-vilanterol (BREO ELLIPTA) 200-25 MCG/INH AEPB Inhale 1 puff into the lungs daily. 60 each 5  . gabapentin (NEURONTIN) 300 MG capsule Take 900 mg by mouth 3 (three) times daily.     Marland Kitchen glucose blood (PRECISION XTRA TEST STRIPS) test strip USE TO CHECK BLOOD SUGAR DAILY 100 each 3  . ipratropium (ATROVENT) 0.06 % nasal spray Use 2 sprays each nostril every 6 hours as needed for runny nose 15  mL 5  . isosorbide mononitrate (IMDUR) 30 MG 24 hr tablet TAKE ONE TABLET BY MOUTH TWICE DAILY. 180 tablet 3  . Melatonin 3 MG CAPS Take 1 capsule by mouth at bedtime.    . metFORMIN (GLUCOPHAGE) 500 MG tablet Take 500 mg by mouth 2 (two) times daily with a meal.    . metoprolol (LOPRESSOR) 50 MG tablet Take 1 tablet (50 mg total) by mouth 2 (two) times daily. 180 tablet 3  . montelukast (SINGULAIR) 10 MG tablet Take 1 tablet (10 mg total) by mouth at bedtime. 30 tablet 5  . nitroGLYCERIN (NITROSTAT) 0.4 MG SL tablet Place 1 tablet (0.4 mg total) under the tongue every 5 (five) minutes as needed. Chest pain 25 tablet 3  . nortriptyline (PAMELOR) 25 MG capsule TAKE ONE CAPSULE BY MOUTH AT BEDTIME 90 capsule 0  . ramipril (ALTACE) 5 MG capsule TAKE ONE CAPSULE BY MOUTH ONCE DAILY 90 capsule 3  . ranitidine (ZANTAC) 300 MG tablet Take 1 tablet (300 mg total) by mouth at bedtime. 30 tablet 5  . simvastatin (ZOCOR) 80 MG tablet Take 40 mg by mouth at bedtime. Rx from [place]    . tamsulosin (FLOMAX) 0.4 MG CAPS capsule TAKE ONE CAPSULE BY MOUTH DAILY 30 capsule 5  . traZODone (DESYREL) 50 MG tablet TAKE ONE-HALF TO ONE TABLET BY MOUTH AT BEDTIME AS NEEDED FOR SLEEP 30 tablet 3  . levothyroxine (SYNTHROID, LEVOTHROID) 25 MCG tablet Take 25 mcg by mouth daily. Pt getting Rx from [place]     No current facility-administered medications on file prior to visit.     BP 118/70 (BP Location: Left Arm, Patient Position: Sitting, Cuff Size: Normal)   Pulse 75   Temp 97.9 F (36.6 C) (Oral)   Resp 20   Ht 5' 10.75" (1.797 m)   Wt 212 lb (96.2 kg)   SpO2 98%   BMI 29.78 kg/m     Review of Systems  Constitutional: Negative for appetite change, chills, fatigue and fever.  HENT: Negative for congestion, dental problem, ear pain, hearing loss, sore throat, tinnitus, trouble swallowing and voice change.   Eyes: Negative for pain, discharge and visual disturbance.  Respiratory: Negative for cough, chest tightness,  wheezing and stridor.   Cardiovascular: Negative for chest pain, palpitations and leg swelling.  Gastrointestinal: Negative for abdominal distention, abdominal pain, blood in stool, constipation, diarrhea, nausea and vomiting.  Genitourinary: Negative for difficulty urinating, discharge, flank pain, genital sores, hematuria and urgency.  Musculoskeletal: Negative for arthralgias, back pain, gait problem, joint swelling, myalgias and neck stiffness.  Skin: Negative for rash.  Neurological: Negative for dizziness, syncope, speech difficulty, weakness, numbness and headaches.  Hematological: Negative for adenopathy. Does not  bruise/bleed easily.  Psychiatric/Behavioral: Negative for behavioral problems and dysphoric mood. The patient is not nervous/anxious.        Objective:   Physical Exam  Constitutional: He is oriented to person, place, and time. He appears well-developed.  Blood pressure low normal  HENT:  Head: Normocephalic.  Right Ear: External ear normal.  Left Ear: External ear normal.  Eyes: Conjunctivae and EOM are normal.  Neck: Normal range of motion.  Cardiovascular: Normal rate and normal heart sounds.   Pulmonary/Chest: Breath sounds normal.  Abdominal: Bowel sounds are normal.  Musculoskeletal: Normal range of motion. He exhibits no edema or tenderness.  Neurological: He is alert and oriented to person, place, and time.  Psychiatric: He has a normal mood and affect. His behavior is normal.          Assessment & Plan:   Diabetes mellitus.  Appears to be well controlled.  Check hemoglobin A1c.  Continue submaximal dose of metformin Chronic kidney disease, stable Essential hypertension  Follow-up 6 months or as needed Flu vaccine administered in September  Rogelia Boga

## 2016-04-16 ENCOUNTER — Telehealth: Payer: Self-pay | Admitting: Internal Medicine

## 2016-04-16 DIAGNOSIS — E119 Type 2 diabetes mellitus without complications: Secondary | ICD-10-CM

## 2016-04-16 DIAGNOSIS — Z0100 Encounter for examination of eyes and vision without abnormal findings: Principal | ICD-10-CM

## 2016-04-16 NOTE — Telephone Encounter (Signed)
Pt would like to have a appointment to go and see a ophthalmology

## 2016-04-17 NOTE — Telephone Encounter (Signed)
Okay to refer to ophthalmology

## 2016-04-17 NOTE — Telephone Encounter (Signed)
Okay to send referral for Ophthalmology?

## 2016-04-18 NOTE — Telephone Encounter (Signed)
Spoke to pt, told him order put in for referral to Ophthalmology and someone will be contacting to schedule an appt. Pt verbalized understanding. Order put in EPIC.

## 2016-05-27 DIAGNOSIS — H5203 Hypermetropia, bilateral: Secondary | ICD-10-CM | POA: Diagnosis not present

## 2016-05-27 LAB — HM DIABETES EYE EXAM

## 2016-05-29 ENCOUNTER — Encounter: Payer: Self-pay | Admitting: Internal Medicine

## 2016-06-05 ENCOUNTER — Other Ambulatory Visit: Payer: Self-pay | Admitting: Allergy and Immunology

## 2016-06-28 ENCOUNTER — Other Ambulatory Visit: Payer: Self-pay | Admitting: Internal Medicine

## 2016-07-22 ENCOUNTER — Telehealth: Payer: Self-pay | Admitting: Cardiovascular Disease

## 2016-07-22 NOTE — Telephone Encounter (Signed)
New Message    Cleo ([employer] agent) states that she faxed Coronary Artery Disease document on 06-13-2016 and it needs to be filled out as soon as possible.  Fax [phone]

## 2016-07-22 NOTE — Telephone Encounter (Signed)
Paperwork has been received in office. Dr. Clifton JamesMcAlhany will be back in office on 2/16

## 2016-07-25 NOTE — Telephone Encounter (Signed)
Completed form placed in medical records basket to be faxed.

## 2016-08-13 ENCOUNTER — Other Ambulatory Visit: Payer: Self-pay | Admitting: Allergy and Immunology

## 2016-08-13 ENCOUNTER — Other Ambulatory Visit: Payer: Self-pay | Admitting: Internal Medicine

## 2016-08-26 ENCOUNTER — Ambulatory Visit: Payer: Medicare Other | Admitting: Allergy and Immunology

## 2016-09-09 ENCOUNTER — Ambulatory Visit (INDEPENDENT_AMBULATORY_CARE_PROVIDER_SITE_OTHER): Payer: Medicare Other | Admitting: Allergy and Immunology

## 2016-09-09 ENCOUNTER — Encounter: Payer: Self-pay | Admitting: Allergy and Immunology

## 2016-09-09 VITALS — BP 112/76 | HR 80 | Resp 20

## 2016-09-09 DIAGNOSIS — J4540 Moderate persistent asthma, uncomplicated: Secondary | ICD-10-CM | POA: Diagnosis not present

## 2016-09-09 DIAGNOSIS — K219 Gastro-esophageal reflux disease without esophagitis: Secondary | ICD-10-CM

## 2016-09-09 DIAGNOSIS — J3089 Other allergic rhinitis: Secondary | ICD-10-CM

## 2016-09-09 MED ORDER — MONTELUKAST SODIUM 10 MG PO TABS: 10.0000 mg | ORAL_TABLET | Freq: Every day | ORAL | 1 refills | Status: DC

## 2016-09-09 NOTE — Progress Notes (Signed)
Follow-up Note  Referring Provider: Gordy Savers, MD Primary Provider: Rogelia Boga, MD Date of Office Visit: 09/09/2016  Subjective:   Richard Ho (DOB: 01/25/1941) is a 76 y.o. male who [facility] in re-evaluation of the following:  HPI: Ko return to this clinic in reevaluation of his asthma and allergic rhinoconjunctivitis and reflux. His last visit to this clinic was September 2017.  He has really done quite well on his current medical therapy and has not required a systemic steroid or antibiotics since I last saw him in this clinic. He does not appear to have any exercise-induced bronchospastic symptoms and rarely uses a short acting bronchodilator. He's had no problems with reflux and no problems with his throat. He does have rhinorrhea that responds appropriately to nasal ipratropium but otherwise has no significant respiratory tract symptoms. He has not been treating his reflux at this point in time but has consolidated his coffee to one time per day. He continues on a combination of anti-inflammatory agents for his respiratory tract.  He did obtain the flu vaccine this year.  Allergies as of 09/09/2016      Reactions   Meloxicam Other (See Comments)   "jumping out of my skin" Pt feels anxiety attacks when on this medicine      Medication List      albuterol 108 (90 Base) MCG/ACT inhaler Commonly known as:  PROVENTIL HFA;VENTOLIN HFA Inhale 2 puffs into the lungs every 4 (four) hours as needed. For shortness of breath   aspirin EC 81 MG tablet Take 81 mg by mouth daily.   b complex vitamins capsule Take 1 capsule by mouth daily.   beclomethasone 80 MCG/ACT inhaler Commonly known as:  QVAR Add 3 puffs 3 times daily with asthma flare-up   BREO ELLIPTA 200-25 MCG/INH Aepb Generic drug:  fluticasone furoate-vilanterol INHALE ONE PUFF INTO LUNGS ONCE DAILY   fluticasone 50 MCG/ACT nasal  spray Commonly known as:  FLONASE USE TWO SPRAY(S) IN EACH NOSTRIL ONCE DAILY AS NEEDED FOR  ALLERGIES.   gabapentin 300 MG capsule Commonly known as:  NEURONTIN Take 900 mg by mouth 3 (three) times daily.   glucose blood test strip Commonly known as:  PRECISION XTRA TEST STRIPS USE TO CHECK BLOOD SUGAR DAILY   ipratropium 0.06 % nasal spray Commonly known as:  ATROVENT USE TWO SPRAY(S) IN EACH NOSTRIL EVERY 6 HOURS AS NEEDED FOR  RUNNY  NOSE   isosorbide mononitrate 30 MG 24 hr tablet Commonly known as:  IMDUR TAKE ONE TABLET BY MOUTH TWICE DAILY   levothyroxine 25 MCG tablet Commonly known as:  SYNTHROID, LEVOTHROID Take 25 mcg by mouth daily. Pt getting Rx from [place]   Melatonin 3 MG Caps Take 1 capsule by mouth at bedtime.   metFORMIN 500 MG tablet Commonly known as:  GLUCOPHAGE Take 500 mg by mouth 2 (two) times daily with a meal.   metoprolol 50 MG tablet Commonly known as:  LOPRESSOR Take 1 tablet (50 mg total) by mouth 2 (two) times daily.   montelukast 10 MG tablet Commonly known as:  SINGULAIR Take 1 tablet (10 mg total) by mouth daily.   nitroGLYCERIN 0.4 MG SL tablet Commonly known as:  NITROSTAT Place 1 tablet (0.4 mg total) under the tongue every 5 (five) minutes as needed. Chest pain   nortriptyline 25 MG capsule Commonly known as:  PAMELOR TAKE ONE CAPSULE BY MOUTH AT BEDTIME   ramipril  5 MG capsule Commonly known as:  ALTACE TAKE ONE CAPSULE BY MOUTH ONCE DAILY   ranitidine 300 MG tablet Commonly known as:  ZANTAC Take 1 tablet (300 mg total) by mouth at bedtime.   simvastatin 80 MG tablet Commonly known as:  ZOCOR Take 40 mg by mouth at bedtime. Rx from [place]   tamsulosin 0.4 MG Caps capsule Commonly known as:  FLOMAX TAKE ONE CAPSULE BY MOUTH DAILY   traZODone 50 MG tablet Commonly known as:  DESYREL TAKE ONE-HALF TO ONE TABLET BY MOUTH AT BEDTIME AS NEEDED FOR SLEEP       Past Medical History:  Diagnosis Date  . Asthma   .  Coronary artery disease 1991   a) Angioplasty LAD 1991 [place] b) MI in 1995 at  [facility] in [place], med Rx  . Diabetes mellitus   . GERD (gastroesophageal reflux disease)   . Hearing loss   . HTN (hypertension)   . Hyperlipidemia   . Myocardial infarct   . Nephrolithiasis   . Osteopenia   . Snake bite     Past Surgical History:  Procedure Laterality Date  . ANGIOPLASTY  1991  . CARDIAC CATHETERIZATION  2005   Moderate LCx and LAD disease and severe diffuse RCA disease  . CARDIAC CATHETERIZATION  01/2012   normal left main, 50% pLAD stenosis, dLAD mild luminal irregularities, D1 30-40% stenosis at take off; subtotally occluded/severely diseased OM1, mid 80% stenosis in small OM2, long prox and mid RCA stenoses ranging from 60-95%, severely diseased PDA, distal PLSA occluded filling via L-R collateral; normal LV function; inferior lateral basal akinesis.  . CHOLECYSTECTOMY  2012  . EYE SURGERY  2010   cataract - right  . INGUINAL HERNIA REPAIR    . LEFT HEART CATHETERIZATION WITH CORONARY ANGIOGRAM N/A 01/28/2012   Procedure: LEFT HEART CATHETERIZATION WITH CORONARY ANGIOGRAM;  Surgeon: Iran Ouch, MD;  Location: [facility] CATH LAB;  Service: Cardiovascular;  Laterality: N/A;  . LEFT HEART CATHETERIZATION WITH CORONARY ANGIOGRAM N/A 03/17/2014   Procedure: LEFT HEART CATHETERIZATION WITH CORONARY ANGIOGRAM;  Surgeon: Kathleene Hazel, MD;  Location: [facility] CATH LAB;  Service: Cardiovascular;  Laterality: N/A;  . ORIF TIBIA & FIBULA FRACTURES    . PTCA    . VASECTOMY      Review of systems negative except as noted in HPI / PMHx or noted below:  Review of Systems  Constitutional: Negative.   HENT: Negative.   Eyes: Negative.   Respiratory: Negative.   Cardiovascular: Negative.   Gastrointestinal: Negative.   Genitourinary: Negative.   Musculoskeletal: Negative.   Skin: Negative.   Neurological: Negative.   Endo/Heme/Allergies: Negative.   Psychiatric/Behavioral:  Negative.      Objective:   Vitals:   09/09/16 1125  BP: 112/76  Pulse: 80  Resp: 20          Physical Exam  Constitutional: He is well-developed, well-nourished, and in no distress.  HENT:  Head: Normocephalic.  Right Ear: Tympanic membrane, external ear and ear canal normal.  Left Ear: Tympanic membrane, external ear and ear canal normal.  Nose: Nose normal. No mucosal edema or rhinorrhea.  Mouth/Throat: Uvula is midline, oropharynx is clear and moist and mucous membranes are normal. No oropharyngeal exudate.  Bilateral hearing aids  Eyes: Conjunctivae are normal.  Neck: Trachea normal. No tracheal tenderness present. No tracheal deviation present. No thyromegaly present.  Cardiovascular: Normal rate, regular rhythm, S1 normal, S2 normal and normal heart sounds.   No  murmur heard. Pulmonary/Chest: Breath sounds normal. No stridor. No respiratory distress. He has no wheezes. He has no rales.  Musculoskeletal: He exhibits no edema.  Lymphadenopathy:       Head (right side): No tonsillar adenopathy present.       Head (left side): No tonsillar adenopathy present.    He has no cervical adenopathy.  Neurological: He is alert. Gait normal.  Skin: No rash noted. He is not diaphoretic. No erythema. Nails show no clubbing.  Psychiatric: Mood and affect normal.    Diagnostics:    Spirometry was performed and demonstrated an FEV1 of 2.01 at 61 % of predicted.  Assessment and Plan:   1. Asthma, moderate persistent, well-controlled   2. Other allergic rhinitis   3. Gastroesophageal reflux disease, esophagitis presence not specified     1. Continue to Perform Allergen avoidance measures  2. Continue to Treat and prevent inflammation:   A. Breo 200 - one inhalation 1 time per day. Replaces Advair  B. Flonase - 2 sprays each nostril one time per day  C. montelukast 10 mg one tablet one time per day  3. If needed:   A. Proventil or Ventolin HFA 2 puffs every 4-6  hours  B. OTC antihistamine - Claritin/Zyrtec/Allegra  C. nasal ipratropium 0.06% 2 sprays each nostril every 6 hours to dry up nose  4. "Action plan" for asthma flare up:   A. continue Breo one time a day  B. add Qvar 80 3 inhalations 3 times a day with spacer  C. use Proventil or Ventolin HFA if needed  5. Can treat reflux with ranitidine 300 mg once a day if needed  6. Return to clinic in 6 months or earlier if problem  Overall Richard Ho has done very well on his current medical therapy which includes a combination of anti-inflammatory agents for both his upper and lower airway. He has the option of treating reflux should he find that he has problems with either classic reflux symptoms or exacerbation of his LPR. If he continues to do this well I will just see him back in this clinic in 6 months or earlier if there is a problem.  Laurette Schimke, MD Allergy / Immunology [facility]

## 2016-09-09 NOTE — Patient Instructions (Signed)
  1. Continue to Perform Allergen avoidance measures  2. Continue to Treat and prevent inflammation:   A. Breo 200 - one inhalation 1 time per day. Replaces Advair  B. Flonase - 2 sprays each nostril one time per day  C. montelukast 10 mg one tablet one time per day  3. If needed:   A. Proventil or Ventolin HFA 2 puffs every 4-6 hours  B. OTC antihistamine - Claritin/Zyrtec/Allegra  C. nasal ipratropium 0.06% 2 sprays each nostril every 6 hours to dry up nose  4. "Action plan" for asthma flare up:   A. continue Breo one time a day  B. add Qvar 80 3 inhalations 3 times a day with spacer  C. use Proventil or Ventolin HFA if needed  5. Can treat reflux with ranitidine 300 mg once a day if needed  6. Return to clinic in 6 months or earlier if problem

## 2016-09-16 ENCOUNTER — Other Ambulatory Visit: Payer: Self-pay | Admitting: Allergy and Immunology

## 2016-09-19 ENCOUNTER — Encounter: Payer: Self-pay | Admitting: Cardiovascular Disease

## 2016-09-22 ENCOUNTER — Ambulatory Visit (INDEPENDENT_AMBULATORY_CARE_PROVIDER_SITE_OTHER): Payer: Medicare Other | Admitting: Internal Medicine

## 2016-09-22 ENCOUNTER — Encounter: Payer: Self-pay | Admitting: Internal Medicine

## 2016-09-22 ENCOUNTER — Ambulatory Visit (INDEPENDENT_AMBULATORY_CARE_PROVIDER_SITE_OTHER): Payer: Medicare Other | Admitting: Cardiovascular Disease

## 2016-09-22 ENCOUNTER — Encounter: Payer: Self-pay | Admitting: Cardiovascular Disease

## 2016-09-22 VITALS — BP 118/70 | HR 85 | Temp 97.6°F | Ht 70.75 in | Wt 212.6 lb

## 2016-09-22 VITALS — BP 118/72 | HR 101 | Ht 70.75 in | Wt 214.0 lb

## 2016-09-22 DIAGNOSIS — I2510 Atherosclerotic heart disease of native coronary artery without angina pectoris: Secondary | ICD-10-CM

## 2016-09-22 DIAGNOSIS — I25118 Atherosclerotic heart disease of native coronary artery with other forms of angina pectoris: Secondary | ICD-10-CM

## 2016-09-22 DIAGNOSIS — I10 Essential (primary) hypertension: Secondary | ICD-10-CM

## 2016-09-22 DIAGNOSIS — K219 Gastro-esophageal reflux disease without esophagitis: Secondary | ICD-10-CM

## 2016-09-22 DIAGNOSIS — E0821 Diabetes mellitus due to underlying condition with diabetic nephropathy: Secondary | ICD-10-CM | POA: Diagnosis not present

## 2016-09-22 DIAGNOSIS — E7800 Pure hypercholesterolemia, unspecified: Secondary | ICD-10-CM

## 2016-09-22 DIAGNOSIS — N183 Chronic kidney disease, stage 3 unspecified: Secondary | ICD-10-CM

## 2016-09-22 DIAGNOSIS — E1142 Type 2 diabetes mellitus with diabetic polyneuropathy: Secondary | ICD-10-CM | POA: Diagnosis not present

## 2016-09-22 NOTE — Progress Notes (Signed)
Pre visit review using our clinic review tool, if applicable. No additional management support is needed unless otherwise documented below in the visit note. 

## 2016-09-22 NOTE — Progress Notes (Signed)
Chief Complaint  Patient presents with  . Leg Pain   History of Present Illness: 76 yo male with history of CAD, HTN, HLD, DM here today for cardiac follow up. He has a history of angioplasty in [place] and in [place] in the 1990s. Last cardiac cath October 2015 with stable moderate CAD. The RCA is diffusely diseased with no focal targets for PCI.  LV function is normal by echo September 2013.    He is here today for follow up. The patient denies any chest pain, dyspnea, palpitations, lower extremity edema, orthopnea, PND, dizziness, near syncope or syncope. He is mostly limited by his leg pain which is felt to be from neuropathy.   Primary Care Physician: Rogelia Boga, MD  Past Medical History:  Diagnosis Date  . Asthma   . Coronary artery disease 1991   a) Angioplasty LAD 1991 [place] b) MI in 1995 at  [facility] in [place], med Rx  . Diabetes mellitus   . GERD (gastroesophageal reflux disease)   . Hearing loss   . HTN (hypertension)   . Hyperlipidemia   . Myocardial infarct (HCC)   . Nephrolithiasis   . Osteopenia   . Snake bite     Past Surgical History:  Procedure Laterality Date  . ANGIOPLASTY  1991  . CARDIAC CATHETERIZATION  2005   Moderate LCx and LAD disease and severe diffuse RCA disease  . CARDIAC CATHETERIZATION  01/2012   normal left main, 50% pLAD stenosis, dLAD mild luminal irregularities, D1 30-40% stenosis at take off; subtotally occluded/severely diseased OM1, mid 80% stenosis in small OM2, long prox and mid RCA stenoses ranging from 60-95%, severely diseased PDA, distal PLSA occluded filling via L-R collateral; normal LV function; inferior lateral basal akinesis.  . CHOLECYSTECTOMY  2012  . EYE SURGERY  2010   cataract - right  . INGUINAL HERNIA REPAIR    . LEFT HEART CATHETERIZATION WITH CORONARY ANGIOGRAM N/A 01/28/2012   Procedure: LEFT HEART CATHETERIZATION WITH CORONARY ANGIOGRAM;  Surgeon: Iran Ouch, MD;  Location: [facility] CATH  LAB;  Service: Cardiovascular;  Laterality: N/A;  . LEFT HEART CATHETERIZATION WITH CORONARY ANGIOGRAM N/A 03/17/2014   Procedure: LEFT HEART CATHETERIZATION WITH CORONARY ANGIOGRAM;  Surgeon: Kathleene Hazel, MD;  Location: [facility] CATH LAB;  Service: Cardiovascular;  Laterality: N/A;  . ORIF TIBIA & FIBULA FRACTURES    . PTCA    . VASECTOMY      Current Outpatient Prescriptions  Medication Sig Dispense Refill  . aspirin EC 81 MG tablet Take 81 mg by mouth daily.    Marland Kitchen b complex vitamins capsule Take 1 capsule by mouth daily.    . beclomethasone (QVAR) 80 MCG/ACT inhaler Add 3 puffs 3 times daily with asthma flare-up 1 Inhaler 5  . BREO ELLIPTA 200-25 MCG/INH AEPB INHALE 1 PUFF BY MOUTH ONCE DAILY 60 each 5  . fluticasone (FLONASE) 50 MCG/ACT nasal spray USE TWO SPRAY(S) IN EACH NOSTRIL ONCE DAILY AS NEEDED FOR  ALLERGIES. 16 g 5  . gabapentin (NEURONTIN) 300 MG capsule Take 600 mg by mouth 2 (two) times daily.     Marland Kitchen glucose blood (PRECISION XTRA TEST STRIPS) test strip USE TO CHECK BLOOD SUGAR DAILY 100 each 3  . ipratropium (ATROVENT) 0.06 % nasal spray USE 2 SPRAY(S) IN EACH NOSTRIL EVERY 6 HOURS AS NEEDED FOR  RUNNY  NOSE 15 mL 5  . isosorbide mononitrate (IMDUR) 30 MG 24 hr tablet TAKE ONE TABLET BY MOUTH TWICE DAILY 180  tablet 3  . levothyroxine (SYNTHROID, LEVOTHROID) 25 MCG tablet Take 25 mcg by mouth daily. Pt getting Rx from [place]    . Melatonin 3 MG CAPS Take 1 capsule by mouth at bedtime.    . metFORMIN (GLUCOPHAGE) 500 MG tablet Take 500 mg by mouth 2 (two) times daily with a meal.    . metoprolol (LOPRESSOR) 50 MG tablet Take 1 tablet (50 mg total) by mouth 2 (two) times daily. 180 tablet 3  . montelukast (SINGULAIR) 10 MG tablet Take 1 tablet (10 mg total) by mouth daily. 90 tablet 1  . nitroGLYCERIN (NITROSTAT) 0.4 MG SL tablet Place 1 tablet (0.4 mg total) under the tongue every 5 (five) minutes as needed. Chest pain 25 tablet 3  . nortriptyline (PAMELOR) 25 MG capsule TAKE  ONE CAPSULE BY MOUTH AT BEDTIME 90 capsule 0  . ramipril (ALTACE) 5 MG capsule TAKE ONE CAPSULE BY MOUTH ONCE DAILY 90 capsule 3  . ranitidine (ZANTAC) 300 MG tablet Take 1 tablet (300 mg total) by mouth at bedtime. 30 tablet 5  . simvastatin (ZOCOR) 80 MG tablet Take 40 mg by mouth at bedtime. Rx from [place]    . tamsulosin (FLOMAX) 0.4 MG CAPS capsule TAKE ONE CAPSULE BY MOUTH DAILY 30 capsule 5  . traZODone (DESYREL) 50 MG tablet TAKE ONE-HALF TO ONE TABLET BY MOUTH AT BEDTIME AS NEEDED FOR SLEEP 30 tablet 3   No current facility-administered medications for this visit.     Allergies  Allergen Reactions  . Meloxicam Other (See Comments)    "jumping out of my skin" Pt feels anxiety attacks when on this medicine    Social History   Social History  . Marital status: Married    Spouse name: N/A  . Number of children: N/A  . Years of education: N/A   Occupational History  . Retired    Social History Main Topics  . Smoking status: Never Smoker  . Smokeless tobacco: Never Used  . Alcohol use 0.0 oz/week     Comment: occ, once every 2 months  . Drug use: No  . Sexual activity: Not on file   Other Topics Concern  . Not on file   Social History Narrative  . No narrative on file    Family History  Problem Relation Age of Onset  . Asthma Mother   . Allergies Sister   . Coronary artery disease Other     Review of Systems:  As stated in the HPI and otherwise negative.   BP 118/72   Pulse (!) 101   Ht 5' 10.75" (1.797 m)   Wt 214 lb (97.1 kg)   SpO2 93%   BMI 30.06 kg/m   Physical Examination: General: Well developed, well nourished, NAD  HEENT: OP clear, mucus membranes moist  SKIN: warm, dry. No rashes. Neuro: No focal deficits  Musculoskeletal: Muscle strength 5/5 all ext  Psychiatric: Mood and affect normal  Neck: No JVD, no carotid bruits, no thyromegaly, no lymphadenopathy.  Lungs:Clear bilaterally, no wheezes, rhonci, crackles Cardiovascular: Regular rate  and rhythm. No murmurs, gallops or rubs. Abdomen:Soft. Bowel sounds present. Non-tender.  Extremities: No lower extremity edema. Pulses are 2 + in the bilateral DP/PT.  Stress myoview 01/23/14:  Stress Procedure: The patient received IV Lexiscan 0.4 mg over 15-seconds with concurrent low level exercise and then Technetium 11m Sestamibi was injected at 30-seconds while the patient continued walking one more minute. Quantitative spect images were obtained after a 45-minute delay.  Stress  ECG: No significant change from baseline ECG  QPS  Raw Data Images: There is interference from nuclear activity from structures below the diaphragm. This does not affect the ability to read the study.  Stress Images: There is decreased uptake in the inferior wall.  Rest Images: There is decreased uptake in the inferior wall.  Subtraction (SDS): These findings are consistent with ischemia.  Transient Ischemic Dilatation (Normal <1.22): 1.33  Lung/Heart Ratio (Normal <0.45): 0.33  Quantitative Gated Spect Images  QGS EDV: 119 ml  QGS ESV: 62 ml  Impression  Exercise Capacity: Lexiscan with low level exercise.  BP Response: Normal blood pressure response.  Clinical Symptoms: No symptoms.  ECG Impression: No significant ECG changes with Lexiscan.  Comparison with Prior Nuclear Study: No images to compare  Overall Impression: Intermediate risk stress nuclear study with reversible inferior ischemia. There is underlying bowel, however, the SDS is 11. Extent 23%..  LV Ejection Fraction: 48%. LV Wall Motion: Mild inferior hypokinesis.  Cardiac cath 03/17/14: Left Main: No obstructive disease.  Left anterior Descending: Large caliber vessel that courses to the apex. The mid vessel has a 50% stenosis involving the takeoff of both diagonal branches. The distal LAD has minor luminal irregularities. The first diagonal is a moderate caliber vessel with ostial 30-40% stenosis at the ostium.  Left Circumflex: Large vessel  that trifurcates quickly into 3 branches. The first OM is small in caliber and is sub-totally occluded proximally, unchanged from last cath. The second OM is small in caliber and has a mid 80% stenosis. The AV groove branch is small and provides collateral flow the the distal RCA.  Right Coronary Artery: Large dominant vessel with proximal 60% stenosis followed by 100% total occlusion in the mid vessel. There are small bridging collaterals that supply the mid and distal vessel. The distal vessel is also seen to fill from left to right collaterals.  Left Ventricular Angiogram: LVEF=45%.   EKG:  EKG is ordered today. The ekg ordered today demonstrates Sinus tachy, rate 101 bpm. PVC  Recent Labs: No results found for requested labs within last 8760 hours.   Lipid Panel HDL 62 LDL 59   Wt Readings from Last 3 Encounters:  09/22/16 214 lb (97.1 kg)  09/22/16 212 lb 9.6 oz (96.4 kg)  03/25/16 212 lb (96.2 kg)     Other studies Reviewed: Additional studies/ records that were reviewed today include: . Review of the above records demonstrates:    Assessment and Plan:   1. CAD with stable angina: He has no chest pain suggestive of angina. CAD stable by cath October 2015. Will continue ASA, statin, beta blocker, Ace-inh and Imdur.     2. HTN: BP controlled today. No changes.   3. HLD: Lipids at goal. Continue statin.   4. PVCs: Rare palpitations. Continue Lopressor 50 mg po BID.    Current medicines are reviewed at length with the patient today.  The patient does not have concerns regarding medicines.  The following changes have been made:  no change  Labs/ tests ordered today include:   Orders Placed This Encounter  Procedures  . EKG 12-Lead     Disposition:   FU with me in 12  months   Signed, Verne Carrow, MD 09/22/2016 4:34 PM    [facility] [address] [place], [place], [place]  [address] Phone: [phone]; Fax: [phone]

## 2016-09-22 NOTE — Patient Instructions (Signed)
Limit your sodium (Salt) intake   Please check your hemoglobin A1c every 3-6 months    It is important that you exercise regularly, at least 20 minutes 3 to 4 times per week.  If you develop chest pain or shortness of breath seek  medical attention.  You need to lose weight.  Consider a lower calorie diet and regular exercise.  Return in 6 months for follow-up   

## 2016-09-22 NOTE — Progress Notes (Signed)
Subjective:    Patient ID: Richard Ho, male    DOB: [birth year], 76 y.o.   MRN: [id number]  HPI  76 year old patient who is seen today for his six-month follow-up. He is also followed the [facility] hospital system.  He has recent lab.  This has included a total cholesterol of 154 and her LDL cholesterol of 59 Hemoglobin A1c was elevated at 7.2, slightly, but he has lost 10 pounds over the past 6 weeks.  Blood sugars presently are often less than 100 Creatinine was 1.24 Random blood sugar 110 Estimated GFR 57 Urine for microalbumin was also checked He is followed by nephrology as well as ophthalmology He also is followed locally by allergy medicine.  His asthma has done quite well  Past Medical History:  Diagnosis Date  . Asthma   . Coronary artery disease 1991   a) Angioplasty LAD 1991 [place] b) MI in 1995 at  [facility] in [place], med Rx  . Diabetes mellitus   . GERD (gastroesophageal reflux disease)   . Hearing loss   . HTN (hypertension)   . Hyperlipidemia   . Myocardial infarct (HCC)   . Nephrolithiasis   . Osteopenia   . Snake bite      Social History   Social History  . Marital status: Married    Spouse name: N/A  . Number of children: N/A  . Years of education: N/A   Occupational History  . Retired    Social History Main Topics  . Smoking status: Never Smoker  . Smokeless tobacco: Never Used  . Alcohol use 0.0 oz/week     Comment: occ, once every 2 months  . Drug use: No  . Sexual activity: Not on file   Other Topics Concern  . Not on file   Social History Narrative  . No narrative on file    Past Surgical History:  Procedure Laterality Date  . ANGIOPLASTY  1991  . CARDIAC CATHETERIZATION  2005   Moderate LCx and LAD disease and severe diffuse RCA disease  . CARDIAC CATHETERIZATION  01/2012   normal left main, 50% pLAD stenosis, dLAD mild luminal irregularities, D1 30-40% stenosis at take off; subtotally occluded/severely diseased OM1,  mid 80% stenosis in small OM2, long prox and mid RCA stenoses ranging from 60-95%, severely diseased PDA, distal PLSA occluded filling via L-R collateral; normal LV function; inferior lateral basal akinesis.  . CHOLECYSTECTOMY  2012  . EYE SURGERY  2010   cataract - right  . INGUINAL HERNIA REPAIR    . LEFT HEART CATHETERIZATION WITH CORONARY ANGIOGRAM N/A 01/28/2012   Procedure: LEFT HEART CATHETERIZATION WITH CORONARY ANGIOGRAM;  Surgeon: Iran Ouch, MD;  Location: [facility] CATH LAB;  Service: Cardiovascular;  Laterality: N/A;  . LEFT HEART CATHETERIZATION WITH CORONARY ANGIOGRAM N/A 03/17/2014   Procedure: LEFT HEART CATHETERIZATION WITH CORONARY ANGIOGRAM;  Surgeon: Kathleene Hazel, MD;  Location: [facility] CATH LAB;  Service: Cardiovascular;  Laterality: N/A;  . ORIF TIBIA & FIBULA FRACTURES    . PTCA    . VASECTOMY      Family History  Problem Relation Age of Onset  . Asthma Mother   . Allergies Sister   . Coronary artery disease Other     Allergies  Allergen Reactions  . Meloxicam Other (See Comments)    "jumping out of my skin" Pt feels anxiety attacks when on this medicine    Current Outpatient Prescriptions on File Prior to Visit  Medication Sig Dispense  Refill  . albuterol (PROVENTIL HFA;VENTOLIN HFA) 108 (90 BASE) MCG/ACT inhaler Inhale 2 puffs into the lungs every 4 (four) hours as needed. For shortness of breath    . aspirin EC 81 MG tablet Take 81 mg by mouth daily.    Marland Kitchen b complex vitamins capsule Take 1 capsule by mouth daily.    . beclomethasone (QVAR) 80 MCG/ACT inhaler Add 3 puffs 3 times daily with asthma flare-up 1 Inhaler 5  . BREO ELLIPTA 200-25 MCG/INH AEPB INHALE 1 PUFF BY MOUTH ONCE DAILY 60 each 5  . fluticasone (FLONASE) 50 MCG/ACT nasal spray USE TWO SPRAY(S) IN EACH NOSTRIL ONCE DAILY AS NEEDED FOR  ALLERGIES. 16 g 5  . gabapentin (NEURONTIN) 300 MG capsule Take 900 mg by mouth 3 (three) times daily.     Marland Kitchen glucose blood (PRECISION XTRA TEST STRIPS)  test strip USE TO CHECK BLOOD SUGAR DAILY 100 each 3  . ipratropium (ATROVENT) 0.06 % nasal spray USE 2 SPRAY(S) IN EACH NOSTRIL EVERY 6 HOURS AS NEEDED FOR  RUNNY  NOSE 15 mL 5  . isosorbide mononitrate (IMDUR) 30 MG 24 hr tablet TAKE ONE TABLET BY MOUTH TWICE DAILY 180 tablet 3  . Melatonin 3 MG CAPS Take 1 capsule by mouth at bedtime.    . metFORMIN (GLUCOPHAGE) 500 MG tablet Take 500 mg by mouth 2 (two) times daily with a meal.    . metoprolol (LOPRESSOR) 50 MG tablet Take 1 tablet (50 mg total) by mouth 2 (two) times daily. 180 tablet 3  . montelukast (SINGULAIR) 10 MG tablet Take 1 tablet (10 mg total) by mouth daily. 90 tablet 1  . nitroGLYCERIN (NITROSTAT) 0.4 MG SL tablet Place 1 tablet (0.4 mg total) under the tongue every 5 (five) minutes as needed. Chest pain 25 tablet 3  . nortriptyline (PAMELOR) 25 MG capsule TAKE ONE CAPSULE BY MOUTH AT BEDTIME 90 capsule 0  . ramipril (ALTACE) 5 MG capsule TAKE ONE CAPSULE BY MOUTH ONCE DAILY 90 capsule 3  . ranitidine (ZANTAC) 300 MG tablet Take 1 tablet (300 mg total) by mouth at bedtime. 30 tablet 5  . simvastatin (ZOCOR) 80 MG tablet Take 40 mg by mouth at bedtime. Rx from [place]    . tamsulosin (FLOMAX) 0.4 MG CAPS capsule TAKE ONE CAPSULE BY MOUTH DAILY 30 capsule 5  . traZODone (DESYREL) 50 MG tablet TAKE ONE-HALF TO ONE TABLET BY MOUTH AT BEDTIME AS NEEDED FOR SLEEP 30 tablet 3  . levothyroxine (SYNTHROID, LEVOTHROID) 25 MCG tablet Take 25 mcg by mouth daily. Pt getting Rx from [place]     No current facility-administered medications on file prior to visit.     BP 118/70 (BP Location: Left Arm, Patient Position: Sitting, Cuff Size: Large)   Pulse 85   Temp 97.6 F (36.4 C) (Oral)   Ht 5' 10.75" (1.797 m)   Wt 212 lb 9.6 oz (96.4 kg)   SpO2 95%   BMI 29.86 kg/m     Review of Systems  Constitutional: Negative for appetite change, chills, fatigue and fever.  HENT: Negative for congestion, dental problem, ear pain, hearing loss, sore  throat, tinnitus, trouble swallowing and voice change.   Eyes: Negative for pain, discharge and visual disturbance.  Respiratory: Negative for cough, chest tightness, wheezing and stridor.   Cardiovascular: Negative for chest pain, palpitations and leg swelling.  Gastrointestinal: Negative for abdominal distention, abdominal pain, blood in stool, constipation, diarrhea, nausea and vomiting.  Genitourinary: Negative for difficulty urinating, discharge, flank  pain, genital sores, hematuria and urgency.  Musculoskeletal: Negative for arthralgias, back pain, gait problem, joint swelling, myalgias and neck stiffness.  Skin: Negative for rash.  Neurological: Negative for dizziness, syncope, speech difficulty, weakness, numbness and headaches.  Hematological: Negative for adenopathy. Does not bruise/bleed easily.  Psychiatric/Behavioral: Negative for behavioral problems and dysphoric mood. The patient is not nervous/anxious.        Objective:   Physical Exam  Constitutional: He is oriented to person, place, and time. He appears well-developed.  HENT:  Head: Normocephalic.  Right Ear: External ear normal.  Left Ear: External ear normal.  Eyes: Conjunctivae and EOM are normal.  Neck: Normal range of motion.  Cardiovascular: Normal rate and normal heart sounds.   Pulmonary/Chest: Breath sounds normal.  Abdominal: Bowel sounds are normal.  Musculoskeletal: Normal range of motion. He exhibits no edema or tenderness.  Neurological: He is alert and oriented to person, place, and time.  Psychiatric: He has a normal mood and affect. His behavior is normal.          Assessment & Plan:   Hypertension.  Well-controlled.  Blood pressure in a low-normal range Diabetes mellitus.  Hemoglobin A1c about 6 weeks, 7.2.  He has had some significant weight loss and better diet.  Will recheck in 3-6 months Coronary artery disease, stable Diabetic peripheral neuropathy.  Stable Dyslipidemia.  LDL at  goal GERD.  Patient has done well with discontinuation of PPI therapy  Follow-up eye examination is scheduled Return here in 6 months for follow-up   Rogelia Boga

## 2016-09-22 NOTE — Patient Instructions (Signed)

## 2016-10-27 DIAGNOSIS — N401 Enlarged prostate with lower urinary tract symptoms: Secondary | ICD-10-CM | POA: Diagnosis not present

## 2016-10-27 DIAGNOSIS — N5201 Erectile dysfunction due to arterial insufficiency: Secondary | ICD-10-CM | POA: Diagnosis not present

## 2016-10-27 DIAGNOSIS — R351 Nocturia: Secondary | ICD-10-CM | POA: Diagnosis not present

## 2016-12-23 ENCOUNTER — Encounter: Payer: Self-pay | Admitting: *Deleted

## 2016-12-30 ENCOUNTER — Encounter: Payer: Self-pay | Admitting: Allergy and Immunology

## 2016-12-30 ENCOUNTER — Ambulatory Visit (INDEPENDENT_AMBULATORY_CARE_PROVIDER_SITE_OTHER): Payer: Medicare Other | Admitting: Allergy and Immunology

## 2016-12-30 VITALS — BP 122/70 | HR 80 | Resp 20

## 2016-12-30 DIAGNOSIS — J3089 Other allergic rhinitis: Secondary | ICD-10-CM | POA: Diagnosis not present

## 2016-12-30 DIAGNOSIS — J4540 Moderate persistent asthma, uncomplicated: Secondary | ICD-10-CM | POA: Diagnosis not present

## 2016-12-30 DIAGNOSIS — K219 Gastro-esophageal reflux disease without esophagitis: Secondary | ICD-10-CM

## 2016-12-30 MED ORDER — FLUTICASONE PROPIONATE 50 MCG/ACT NA SUSP: NASAL | 5 refills | Status: DC

## 2016-12-30 NOTE — Patient Instructions (Signed)
  1. Continue to Perform Allergen avoidance measures  2. Continue to Treat and prevent inflammation:   A. Breo 200 - one inhalation 1 time per day. Replaces Advair  B. Flonase - 2 sprays each nostril one time per day  C. montelukast 10 mg one tablet one time per day  3. If needed:   A. Proventil or Ventolin HFA 2 puffs every 4-6 hours  B. OTC antihistamine - Claritin/Zyrtec/Allegra  C. nasal ipratropium 0.06% 2 sprays each nostril every 6 hours  4. "Action plan" for asthma flare up:   A. continue Breo one time a day  B. add Qvar 80 Redihaler 3 inhalations 3 times a day with spacer  C. use Proventil or Ventolin HFA if needed  5. Can treat reflux with ranitidine 300 mg or Tums once a day if needed  6. Return to clinic in 6 months or earlier if problem  7. Obtain fall flu vaccine

## 2016-12-30 NOTE — Progress Notes (Signed)
Follow-up Note  Referring Provider: Gordy SaversKwiatkowski, Peter F, MD Primary Provider: Gordy SaversKwiatkowski, Peter F, MD Date of Office Visit: 12/30/2016  Subjective:   Richard Ho (DOB: [birth year]) is a 76 y.o. male who [facility] in re-evaluation of the following:  HPI: Richard Ho returns to this clinic in evaluation of his asthma and allergic rhinoconjunctivitis and reflux. His last visit to this clinic was 09/09/2016  During the interval he has done wonderful and has not required a systemic steroid or an antibiotic to treat any type of respiratory tract issue. He can exert himself without any problem and rarely uses a short acting bronchodilator while continuing to use a combination of Breo, Flonase, and montelukast.  His nose has really been doing quite well. He uses nasal ipratropium to dry up his nose which works relatively well.  He has not had any problems with reflux at this point in time and relies on the use of Tums about 1 time per week in the treatment of this condition.  Allergies as of 12/30/2016      Reactions   Meloxicam Other (See Comments)   "jumping out of my skin" Pt feels anxiety attacks when on this medicine      Medication List      aspirin EC 81 MG tablet Take 81 mg by mouth daily.   b complex vitamins capsule Take 1 capsule by mouth daily.   beclomethasone 80 MCG/ACT inhaler Commonly known as:  QVAR Add 3 puffs 3 times daily with asthma flare-up   BREO ELLIPTA 200-25 MCG/INH Aepb Generic drug:  fluticasone furoate-vilanterol INHALE 1 PUFF BY MOUTH ONCE DAILY   fluticasone 50 MCG/ACT nasal spray Commonly known as:  FLONASE USE TWO SPRAY(S) IN EACH NOSTRIL ONCE DAILY AS NEEDED FOR  ALLERGIES.   gabapentin 300 MG capsule Commonly known as:  NEURONTIN Take 600 mg by mouth 2 (two) times daily.   glucose blood test strip Commonly known as:  PRECISION XTRA TEST STRIPS USE TO CHECK BLOOD SUGAR DAILY     ipratropium 0.06 % nasal spray Commonly known as:  ATROVENT USE 2 SPRAY(S) IN EACH NOSTRIL EVERY 6 HOURS AS NEEDED FOR  RUNNY  NOSE   isosorbide mononitrate 30 MG 24 hr tablet Commonly known as:  IMDUR TAKE ONE TABLET BY MOUTH TWICE DAILY   levothyroxine 25 MCG tablet Commonly known as:  SYNTHROID, LEVOTHROID Take 25 mcg by mouth daily. [place]   Melatonin 3 MG Caps Take 1 capsule by mouth at bedtime.   metFORMIN 500 MG tablet Commonly known as:  GLUCOPHAGE Take 500 mg by mouth 2 (two) times daily with a meal.   metoprolol tartrate 50 MG tablet Commonly known as:  LOPRESSOR Take 1 tablet (50 mg total) by mouth 2 (two) times daily.   montelukast 10 MG tablet Commonly known as:  SINGULAIR Take 1 tablet (10 mg total) by mouth daily.   nitroGLYCERIN 0.4 MG SL tablet Commonly known as:  NITROSTAT Place 1 tablet (0.4 mg total) under the tongue every 5 (five) minutes as needed. Chest pain   nortriptyline 25 MG capsule Commonly known as:  PAMELOR TAKE ONE CAPSULE BY MOUTH AT BEDTIME   ramipril 5 MG capsule Commonly known as:  ALTACE TAKE ONE CAPSULE BY MOUTH ONCE DAILY   ranitidine 300 MG tablet Commonly known as:  ZANTAC Take 1 tablet (300 mg total) by mouth at bedtime.   simvastatin 80 MG tablet Commonly  known as:  ZOCOR Take 40 mg by mouth at bedtime. Rx from [place]   tamsulosin 0.4 MG Caps capsule Commonly known as:  FLOMAX TAKE ONE CAPSULE BY MOUTH DAILY   traZODone 50 MG tablet Commonly known as:  DESYREL TAKE ONE-HALF TO ONE TABLET BY MOUTH AT BEDTIME AS NEEDED FOR SLEEP       Past Medical History:  Diagnosis Date  . Asthma   . Coronary artery disease 1991   a) Angioplasty LAD 1991 [place] b) MI in 1995 at  [facility] in [place], med Rx  . Diabetes mellitus   . GERD (gastroesophageal reflux disease)   . Hearing loss   . HTN (hypertension)   . Hyperlipidemia   . Myocardial infarct (HCC)   . Nephrolithiasis   . Osteopenia   .  Snake bite     Past Surgical History:  Procedure Laterality Date  . ANGIOPLASTY  1991  . CARDIAC CATHETERIZATION  2005   Moderate LCx and LAD disease and severe diffuse RCA disease  . CARDIAC CATHETERIZATION  01/2012   normal left main, 50% pLAD stenosis, dLAD mild luminal irregularities, D1 30-40% stenosis at take off; subtotally occluded/severely diseased OM1, mid 80% stenosis in small OM2, long prox and mid RCA stenoses ranging from 60-95%, severely diseased PDA, distal PLSA occluded filling via L-R collateral; normal LV function; inferior lateral basal akinesis.  . CHOLECYSTECTOMY  2012  . EYE SURGERY  2010   cataract - right  . INGUINAL HERNIA REPAIR    . LEFT HEART CATHETERIZATION WITH CORONARY ANGIOGRAM N/A 01/28/2012   Procedure: LEFT HEART CATHETERIZATION WITH CORONARY ANGIOGRAM;  Surgeon: Iran Ouch, MD;  Location: [facility] CATH LAB;  Service: Cardiovascular;  Laterality: N/A;  . LEFT HEART CATHETERIZATION WITH CORONARY ANGIOGRAM N/A 03/17/2014   Procedure: LEFT HEART CATHETERIZATION WITH CORONARY ANGIOGRAM;  Surgeon: Kathleene Hazel, MD;  Location: [facility] CATH LAB;  Service: Cardiovascular;  Laterality: N/A;  . ORIF TIBIA & FIBULA FRACTURES    . PTCA    . VASECTOMY      Review of systems negative except as noted in HPI / PMHx or noted below:  Review of Systems  Constitutional: Negative.   HENT: Negative.   Eyes: Negative.   Respiratory: Negative.   Cardiovascular: Negative.   Gastrointestinal: Negative.   Genitourinary: Negative.   Musculoskeletal: Negative.   Skin: Negative.   Neurological: Negative.   Endo/Heme/Allergies: Negative.   Psychiatric/Behavioral: Negative.      Objective:   Vitals:   12/30/16 1556  BP: 122/70  Pulse: 80  Resp: 20          Physical Exam  Constitutional: He is well-developed, well-nourished, and in no distress.  HENT:  Head: Normocephalic.  Right Ear: Tympanic membrane, external ear and ear canal normal.  Left Ear:  Tympanic membrane, external ear and ear canal normal.  Nose: Nose normal. No mucosal edema or rhinorrhea.  Mouth/Throat: Uvula is midline, oropharynx is clear and moist and mucous membranes are normal. No oropharyngeal exudate.  Eyes: Conjunctivae are normal.  Neck: Trachea normal. No tracheal tenderness present. No tracheal deviation present. No thyromegaly present.  Cardiovascular: Normal rate, regular rhythm, S1 normal, S2 normal and normal heart sounds.   No murmur heard. Pulmonary/Chest: Breath sounds normal. No stridor. No respiratory distress. He has no wheezes. He has no rales.  Musculoskeletal: He exhibits no edema.  Lymphadenopathy:       Head (right side): No tonsillar adenopathy present.  Head (left side): No tonsillar adenopathy present.    He has no cervical adenopathy.  Neurological: He is alert. Gait normal.  Skin: No rash noted. He is not diaphoretic. No erythema. Nails show no clubbing.  Psychiatric: Mood and affect normal.    Diagnostics:    Spirometry was performed and demonstrated an FEV1 of 2.02 at 64 % of predicted.  The patient had an Asthma Control Test with the following results: ACT Total Score: 24.    Assessment and Plan:   1. Asthma, moderate persistent, well-controlled   2. Other allergic rhinitis   3. Gastroesophageal reflux disease, esophagitis presence not specified     1. Continue to Perform Allergen avoidance measures  2. Continue to Treat and prevent inflammation:   A. Breo 200 - one inhalation 1 time per day. Replaces Advair  B. Flonase - 2 sprays each nostril one time per day  C. montelukast 10 mg one tablet one time per day  3. If needed:   A. Proventil or Ventolin HFA 2 puffs every 4-6 hours  B. OTC antihistamine - Claritin/Zyrtec/Allegra  C. nasal ipratropium 0.06% 2 sprays each nostril every 6 hours  4. "Action plan" for asthma flare up:   A. continue Breo one time a day  B. add Qvar 80 Redihaler 3 inhalations 3 times a  day with spacer  C. use Proventil or Ventolin HFA if needed  5. Can treat reflux with ranitidine 300 mg or Tums once a day if needed  6. Return to clinic in 6 months or earlier if problem  7. Obtain fall flu vaccine  Overall Lyman Bishop appears to be doing very well on his current medical therapy and we will continue to have him use a combination therapy directed against inflammation of his respiratory tract and attention to reflux. If for some reason he has difficulty over the course the next 6 months he will contact me for further evaluation and treatment. I will see him back in this clinic in 6 months or earlier if there is a problem.  Laurette Schimke, MD Allergy / Immunology [facility]

## 2017-01-02 ENCOUNTER — Other Ambulatory Visit: Payer: Self-pay | Admitting: Allergy and Immunology

## 2017-01-06 NOTE — Progress Notes (Signed)
Chief Complaint  Patient presents with  . Follow-up   History of Present Illness: 76 yo male with history of CAD, HTN, HLD, DM here today for cardiac follow up. He has a history of coronary angioplasty in [place] and in [place] in the 1990s. Last cardiac cath October 2015 with stable moderate CAD. The RCA is diffusely diseased with no focal targets for PCI.  LV function is normal by echo September 2013.    He is here today for follow up. The patient has no severe chest pain with exertion. He continues to have low grade chest pressure with moderate exertion. He has dyspnea with moderate exertion which has not changed. No palpitations, lower extremity edema, orthopnea, PND, dizziness, near syncope or syncope. He continues to have pain in his feet due to his neuropathy.   Primary Care Physician: Gordy SaversKwiatkowski, Peter F, MD  Past Medical History:  Diagnosis Date  . Asthma   . Coronary artery disease 1991   a) Angioplasty LAD 1991 [place] b) MI in 1995 at  [facility] in [place], med Rx  . Diabetes mellitus   . GERD (gastroesophageal reflux disease)   . Hearing loss   . HTN (hypertension)   . Hyperlipidemia   . Myocardial infarct (HCC)   . Nephrolithiasis   . Osteopenia   . Snake bite     Past Surgical History:  Procedure Laterality Date  . ANGIOPLASTY  1991  . CARDIAC CATHETERIZATION  2005   Moderate LCx and LAD disease and severe diffuse RCA disease  . CARDIAC CATHETERIZATION  01/2012   normal left main, 50% pLAD stenosis, dLAD mild luminal irregularities, D1 30-40% stenosis at take off; subtotally occluded/severely diseased OM1, mid 80% stenosis in small OM2, long prox and mid RCA stenoses ranging from 60-95%, severely diseased PDA, distal PLSA occluded filling via L-R collateral; normal LV function; inferior lateral basal akinesis.  . CHOLECYSTECTOMY  2012  . EYE SURGERY  2010   cataract - right  . INGUINAL HERNIA REPAIR    . LEFT HEART CATHETERIZATION WITH CORONARY  ANGIOGRAM N/A 01/28/2012   Procedure: LEFT HEART CATHETERIZATION WITH CORONARY ANGIOGRAM;  Surgeon: Iran OuchMuhammad A Arida, MD;  Location: [facility] C[facility];  Service: Cardiovascular;  Laterality: N/A;  . LEFT HEART CATHETERIZATION WITH CORONARY ANGIOGRAM N/A 03/17/2014   Procedure: LEFT HEART CATHETERIZATION WITH CORONARY ANGIOGRAM;  Surgeon: Kathleene Hazelhristopher D Mordechai Matuszak, MD;  [facility];  Service: Cardiovascular;  Laterality: N/A;  . ORIF TIBIA & FIBULA FRACTURES    . PTCA    . VASECTOMY      Current Outpatient Prescriptions  Medication Sig Dispense Refill  . aspirin EC 81 MG tablet Take 81 mg by mouth daily.    Marland Kitchen. b complex vitamins capsule Take 1 capsule by mouth daily.    . beclomethasone (QVAR) 80 MCG/ACT inhaler Add 3 puffs 3 times daily with asthma flare-up 1 Inhaler 5  . BREO ELLIPTA 200-25 MCG/INH AEPB INHALE 1 PUFF BY MOUTH ONCE DAILY 60 each 5  . fluticasone (FLONASE) 50 MCG/ACT nasal spray USE TWO SPRAY(S) IN EACH NOSTRIL ONCE DAILY AS NEEDED FOR  ALLERGIES. 16 g 5  . gabapentin (NEURONTIN) 300 MG capsule Take 600 mg by mouth 2 (two) times daily.     Marland Kitchen. glucose blood (PRECISION XTRA TEST STRIPS) test strip USE TO CHECK BLOOD SUGAR DAILY 100 each 3  . ipratropium (ATROVENT) 0.06 % nasal spray USE 2 SPRAY(S) IN EACH NOSTRIL EVERY 6 HOURS AS NEEDED FOR  RUNNY  NOSE 15 mL 5  . isosorbide mononitrate (IMDUR) 30 MG 24 hr tablet TAKE ONE TABLET BY MOUTH TWICE DAILY 180 tablet 3  . levothyroxine (SYNTHROID, LEVOTHROID) 25 MCG tablet Take 25 mcg by mouth daily. Pt getting [facility]    . Melatonin 3 MG CAPS Take 1 capsule by mouth at bedtime.    . metFORMIN (GLUCOPHAGE) 500 MG tablet Take 500 mg by mouth 2 (two) times daily with a meal.    . metoprolol (LOPRESSOR) 50 MG tablet Take 1 tablet (50 mg total) by mouth 2 (two) times daily. 180 tablet 3  . montelukast (SINGULAIR) 10 MG tablet TAKE 1 TABLET BY MOUTH ONCE DAILY 90 tablet 1  . nitroGLYCERIN (NITROSTAT) 0.4 MG SL tablet Place 1 tablet (0.4 mg  total) under the tongue every 5 (five) minutes as needed. Chest pain 25 tablet 3  . nortriptyline (PAMELOR) 25 MG capsule TAKE ONE CAPSULE BY MOUTH AT BEDTIME 90 capsule 0  . ramipril (ALTACE) 5 MG capsule TAKE ONE CAPSULE BY MOUTH ONCE DAILY 90 capsule 3  . ranitidine (ZANTAC) 300 MG tablet Take 1 tablet (300 mg total) by mouth at bedtime. 30 tablet 5  . simvastatin (ZOCOR) 80 MG tablet Take 40 mg by mouth at bedtime. Rx from [facility]    . tamsulosin (FLOMAX) 0.4 MG CAPS capsule TAKE ONE CAPSULE BY MOUTH DAILY 30 capsule 5  . traZODone (DESYREL) 50 MG tablet TAKE ONE-HALF TO ONE TABLET BY MOUTH AT BEDTIME AS NEEDED FOR SLEEP 30 tablet 3   No current facility-administered medications for this visit.     Allergies  Allergen Reactions  . Meloxicam Other (See Comments)    "jumping out of my skin" Pt feels anxiety attacks when on this medicine    Social History   Social History  . Marital status: Married    Spouse name: N/A  . Number of children: N/A  . Years of education: N/A   Occupational History  . Retired    Social History Main Topics  . Smoking status: Never Smoker  . Smokeless tobacco: Never Used  . Alcohol use 0.0 oz/week     Comment: occ, once every 2 months  . Drug use: No  . Sexual activity: Not on file   Other Topics Concern  . Not on file   Social History Narrative  . No narrative on file    Family History  Problem Relation Age of Onset  . Asthma Mother   . Allergies Sister   . Coronary artery disease Other     Review of Systems:  As stated in the HPI and otherwise negative.   BP 110/60   Pulse 72   Ht 5' 10.75" (1.797 m)   Wt 216 lb (98 kg)   SpO2 94%   BMI 30.34 kg/m   Physical Examination: General: Well developed, well nourished, NAD  HEENT: OP clear, mucus membranes moist  SKIN: warm, dry. No rashes. Neuro: No focal deficits  Musculoskeletal: Muscle strength 5/5 all ext  Psychiatric: Mood and affect normal  Neck: No JVD, no carotid bruits, no  thyromegaly, no lymphadenopathy.  Lungs:Clear bilaterally, no wheezes, rhonci, crackles Cardiovascular: Regular rate and rhythm. No murmurs, gallops or rubs. Abdomen:Soft. Bowel sounds present. Non-tender.  Extremities: No lower extremity edema. Pulses are 2 + in the bilateral DP/PT.  Cardiac cath 03/17/14: Left Main: No obstructive disease.  Left anterior Descending: Large caliber vessel that courses to the apex. The mid vessel has a 50% stenosis involving the  takeoff of both diagonal branches. The distal LAD has minor luminal irregularities. The first diagonal is a moderate caliber vessel with ostial 30-40% stenosis at the ostium.  Left Circumflex: Large vessel that trifurcates quickly into 3 branches. The first OM is small in caliber and is sub-totally occluded proximally, unchanged from last cath. The second OM is small in caliber and has a mid 80% stenosis. The AV groove branch is small and provides collateral flow the the distal RCA.  Right Coronary Artery: Large dominant vessel with proximal 60% stenosis followed by 100% total occlusion in the mid vessel. There are small bridging collaterals that supply the mid and distal vessel. The distal vessel is also seen to fill from left to right collaterals.  Left Ventricular Angiogram: LVEF=45%.   EKG:  EKG is not ordered today. The ekg ordered today demonstrates   Recent Labs: No results found for requested labs within last 8760 hours.   Lipid Panel Lipid Panel     Component Value Date/Time   CHOL 152 07/16/2015   TRIG 137 07/16/2015   HDL 45 07/16/2015   CHOLHDL 4 02/26/2015 0913   VLDL 26.4 02/26/2015 0913   LDLCALC 80 07/16/2015    Wt Readings from Last 3 Encounters:  01/07/17 216 lb (98 kg)  09/22/16 214 lb (97.1 kg)  09/22/16 212 lb 9.6 oz (96.4 kg)     Other studies Reviewed: Additional studies/ records that were reviewed today include: . Review of the above records demonstrates:    Assessment and Plan:   1. CAD  with stable angina: He has had no worrisome chest pain episodes over the last year. Last cath in October 2015 with stable moderate CAD. Will continue ASA, statin, beta blocker, Ace-inh and Imdur.   Will arrange echo to assess LV function.   2. HTN: BP controlled. No changes.   3. HLD: LDL at goal. Continue statin. Lipids followed in primary care.   4. PVCs: He is not aware of palpitations. Continue beta blocker.   Current medicines are reviewed at length with the patient today.  The patient does not have concerns regarding medicines.  The following changes have been made:  no change  Labs/ tests ordered today include:   Orders Placed This Encounter  Procedures  . ECHOCARDIOGRAM COMPLETE     Disposition:   FU with me in 12  months   Signed, Verne Carrow, MD 01/07/2017 10:43 AM    [facility] [address] [place], [place], [place]  [address] Phone: [phone]; Fax: [phone]        Chief Complaint  Patient presents with  . Follow-up   History of Present Illness: 76 yo male with history of CAD, HTN, HLD, DM here today for cardiac follow up. He has a history of angioplasty in [place] and in [place] in the 1990s. Last cardiac cath October 2015 with stable moderate CAD. The RCA is diffusely diseased with no focal targets for PCI.  LV function is normal by echo September 2013.    He is here today for follow up. The patient denies any chest pain, dyspnea, palpitations, lower extremity edema, orthopnea, PND, dizziness, near syncope or syncope. He is mostly limited by his leg pain which is felt to be from neuropathy.   Primary Care Physician: Gordy Savers, MD  Past Medical History:  Diagnosis Date  . Asthma   . Coronary artery disease 1991   a) Angioplasty LAD 1991 [place] b) MI in 1995 at  [facility] in [place], med Rx  . Diabetes mellitus   . GERD (gastroesophageal reflux disease)   . Hearing loss   . HTN (hypertension)   .  Hyperlipidemia   . Myocardial infarct (HCC)   . Nephrolithiasis   . Osteopenia   . Snake bite     Past Surgical History:  Procedure Laterality Date  . ANGIOPLASTY  1991  . CARDIAC CATHETERIZATION  2005   Moderate LCx and LAD disease and severe diffuse RCA disease  . CARDIAC CATHETERIZATION  01/2012   normal left main, 50% pLAD stenosis, dLAD mild luminal irregularities, D1 30-40% stenosis at take off; subtotally occluded/severely diseased OM1, mid 80% stenosis in small OM2, long prox and mid RCA stenoses ranging from 60-95%, severely diseased PDA, distal PLSA occluded filling via L-R collateral; normal LV function; inferior lateral basal akinesis.  . CHOLECYSTECTOMY  2012  . EYE SURGERY  2010   cataract - right  . INGUINAL HERNIA REPAIR    . LEFT HEART CATHETERIZATION WITH CORONARY ANGIOGRAM N/A 01/28/2012   Procedure: LEFT HEART CATHETERIZATION WITH CORONARY ANGIOGRAM;  Surgeon: Iran Ouch, MD;  Location: [facility] C[facility];  Service: Cardiovascular;  Laterality: N/A;  . LEFT HEART CATHETERIZATION WITH CORONARY ANGIOGRAM N/A 03/17/2014   Procedure: LEFT HEART CATHETERIZATION WITH CORONARY ANGIOGRAM;  Surgeon: Kathleene Hazel, MD;  Location: [facility] CATH LAB;  Service: Cardiovascular;  Laterality: N/A;  . ORIF TIBIA & FIBULA FRACTURES    . PTCA    . VASECTOMY      Current Outpatient Prescriptions  Medication Sig Dispense Refill  . aspirin EC 81 MG tablet Take 81 mg by mouth daily.    Marland Kitchen b complex vitamins capsule Take 1 capsule by mouth daily.    . beclomethasone (QVAR) 80 MCG/ACT inhaler Add 3 puffs 3 times daily with asthma flare-up 1 Inhaler 5  . BREO ELLIPTA 200-25 MCG/INH AEPB INHALE 1 PUFF BY MOUTH ONCE DAILY 60 each 5  . fluticasone (FLONASE) 50 MCG/ACT nasal spray USE TWO SPRAY(S) IN EACH NOSTRIL ONCE DAILY AS NEEDED FOR  ALLERGIES. 16 g 5  . gabapentin (NEURONTIN) 300 MG capsule Take 600 mg by mouth 2 (two) times daily.     Marland Kitchen glucose blood (PRECISION XTRA TEST STRIPS) test  strip USE TO CHECK BLOOD SUGAR DAILY 100 each 3  . ipratropium (ATROVENT) 0.06 % nasal spray USE 2 SPRAY(S) IN EACH NOSTRIL EVERY 6 HOURS AS NEEDED FOR  RUNNY  NOSE 15 mL 5  . isosorbide mononitrate (IMDUR) 30 MG 24 hr tablet TAKE ONE TABLET BY MOUTH TWICE DAILY 180 tablet 3  . levothyroxine (SYNTHROID, LEVOTHROID) 25 MCG tablet Take 25 mcg by mouth daily. Pt getting Rx from [place]    . Melatonin 3 MG CAPS Take 1 capsule by mouth at bedtime.    . metFORMIN (GLUCOPHAGE) 500 MG tablet Take 500 mg by mouth 2 (two) times daily with a meal.    . metoprolol (LOPRESSOR) 50 MG tablet Take 1 tablet (50 mg total) by mouth 2 (two) times daily. 180 tablet 3  . montelukast (SINGULAIR) 10 MG tablet TAKE 1 TABLET BY MOUTH ONCE DAILY 90 tablet 1  . nitroGLYCERIN (NITROSTAT) 0.4 MG SL tablet Place 1 tablet (0.4 mg total) under the tongue every 5 (five) minutes as needed. Chest pain 25 tablet 3  . nortriptyline (PAMELOR) 25 MG capsule TAKE ONE CAPSULE BY MOUTH AT BEDTIME 90 capsule 0  . ramipril (ALTACE) 5 MG capsule TAKE ONE CAPSULE BY MOUTH ONCE  DAILY 90 capsule 3  . ranitidine (ZANTAC) 300 MG tablet Take 1 tablet (300 mg total) by mouth at bedtime. 30 tablet 5  . simvastatin (ZOCOR) 80 MG tablet Take 40 mg by mouth at bedtime. Rx from [place]    . tamsulosin (FLOMAX) 0.4 MG CAPS capsule TAKE ONE CAPSULE BY MOUTH DAILY 30 capsule 5  . traZODone (DESYREL) 50 MG tablet TAKE ONE-HALF TO ONE TABLET BY MOUTH AT BEDTIME AS NEEDED FOR SLEEP 30 tablet 3   No current facility-administered medications for this visit.     Allergies  Allergen Reactions  . Meloxicam Other (See Comments)    "jumping out of my skin" Pt feels anxiety attacks when on this medicine    Social History   Social History  . Marital status: Married    Spouse name: N/A  . Number of children: N/A  . Years of education: N/A   Occupational History  . Retired    Social History Main Topics  . Smoking status: Never Smoker  . Smokeless tobacco: Never  Used  . Alcohol use 0.0 oz/week     Comment: occ, once every 2 months  . Drug use: No  . Sexual activity: Not on file   Other Topics Concern  . Not on file   Social History Narrative  . No narrative on file    Family History  Problem Relation Age of Onset  . Asthma Mother   . Allergies Sister   . Coronary artery disease Other     Review of Systems:  As stated in the HPI and otherwise negative.   BP 110/60   Pulse 72   Ht 5' 10.75" (1.797 m)   Wt 216 lb (98 kg)   SpO2 94%   BMI 30.34 kg/m   Physical Examination: General: Well developed, well nourished, NAD  HEENT: OP clear, mucus membranes moist  SKIN: warm, dry. No rashes. Neuro: No focal deficits  Musculoskeletal: Muscle strength 5/5 all ext  Psychiatric: Mood and affect normal  Neck: No JVD, no carotid bruits, no thyromegaly, no lymphadenopathy.  Lungs:Clear bilaterally, no wheezes, rhonci, crackles Cardiovascular: Regular rate and rhythm. No murmurs, gallops or rubs. Abdomen:Soft. Bowel sounds present. Non-tender.  Extremities: No lower extremity edema. Pulses are 2 + in the bilateral DP/PT.  Stress myoview 01/23/14:  Stress Procedure: The patient received IV Lexiscan 0.4 mg over 15-seconds with concurrent low level exercise and then Technetium 19m Sestamibi was injected at 30-seconds while the patient continued walking one more minute. Quantitative spect images were obtained after a 45-minute delay.  Stress ECG: No significant change from baseline ECG  QPS  Raw Data Images: There is interference from nuclear activity from structures below the diaphragm. This does not affect the ability to read the study.  Stress Images: There is decreased uptake in the inferior wall.  Rest Images: There is decreased uptake in the inferior wall.  Subtraction (SDS): These findings are consistent with ischemia.  Transient Ischemic Dilatation (Normal <1.22): 1.33  Lung/Heart Ratio (Normal <0.45): 0.33  Quantitative Gated Spect  Images  QGS EDV: 119 ml  QGS ESV: 62 ml  Impression  Exercise Capacity: Lexiscan with low level exercise.  BP Response: Normal blood pressure response.  Clinical Symptoms: No symptoms.  ECG Impression: No significant ECG changes with Lexiscan.  Comparison with Prior Nuclear Study: No images to compare  Overall Impression: Intermediate risk stress nuclear study with reversible inferior ischemia. There is underlying bowel, however, the SDS is 11. Extent 23%.Marland Kitchen  LV  Ejection Fraction: 48%. LV Wall Motion: Mild inferior hypokinesis.  Cardiac cath 03/17/14: Left Main: No obstructive disease.  Left anterior Descending: Large caliber vessel that courses to the apex. The mid vessel has a 50% stenosis involving the takeoff of both diagonal branches. The distal LAD has minor luminal irregularities. The first diagonal is a moderate caliber vessel with ostial 30-40% stenosis at the ostium.  Left Circumflex: Large vessel that trifurcates quickly into 3 branches. The first OM is small in caliber and is sub-totally occluded proximally, unchanged from last cath. The second OM is small in caliber and has a mid 80% stenosis. The AV groove branch is small and provides collateral flow the the distal RCA.  Right Coronary Artery: Large dominant vessel with proximal 60% stenosis followed by 100% total occlusion in the mid vessel. There are small bridging collaterals that supply the mid and distal vessel. The distal vessel is also seen to fill from left to right collaterals.  Left Ventricular Angiogram: LVEF=45%.   EKG:  EKG is ordered today. The ekg ordered today demonstrates Sinus tachy, rate 101 bpm. PVC  Recent Labs: No results found for requested labs within last 8760 hours.   Lipid Panel HDL 62 LDL 59   Wt Readings from Last 3 Encounters:  01/07/17 216 lb (98 kg)  09/22/16 214 lb (97.1 kg)  09/22/16 212 lb 9.6 oz (96.4 kg)     Other studies Reviewed: Additional studies/ records that were reviewed  today include: . Review of the above records demonstrates:    Assessment and Plan:   1. CAD with stable angina: He has chronic mild chest pain and dyspnea with moderate exertion, likely due to disease in his RCA which has no focal targets for PCI. Last cath in October 2015.  Will continue ASA, statin, beta blocker, Ace-inh and Imdur.     2. HTN: BP controlled today. No changes.   3. HLD: Lipids at goal. Continue statin.   4. PVCs: Rare palpitations. Continue Lopressor 50 mg po BID.    Current medicines are reviewed at length with the patient today.  The patient does not have concerns regarding medicines.  The following changes have been made:  no change  Labs/ tests ordered today include:   Orders Placed This Encounter  Procedures  . ECHOCARDIOGRAM COMPLETE     Disposition:   FU with me in 12  months   Signed, Verne Carrow, MD 01/07/2017 10:43 AM    [facility] [address] [place], [place], [place]  [address] Phone: [phone]; Fax: [phone]

## 2017-01-07 ENCOUNTER — Encounter: Payer: Self-pay | Admitting: Cardiovascular Disease

## 2017-01-07 ENCOUNTER — Ambulatory Visit (INDEPENDENT_AMBULATORY_CARE_PROVIDER_SITE_OTHER): Payer: Medicare Other | Admitting: Cardiovascular Disease

## 2017-01-07 VITALS — BP 110/60 | HR 72 | Ht 70.75 in | Wt 216.0 lb

## 2017-01-07 DIAGNOSIS — I10 Essential (primary) hypertension: Secondary | ICD-10-CM

## 2017-01-07 DIAGNOSIS — I25118 Atherosclerotic heart disease of native coronary artery with other forms of angina pectoris: Secondary | ICD-10-CM

## 2017-01-07 DIAGNOSIS — I493 Ventricular premature depolarization: Secondary | ICD-10-CM

## 2017-01-07 DIAGNOSIS — E7800 Pure hypercholesterolemia, unspecified: Secondary | ICD-10-CM

## 2017-01-07 NOTE — Patient Instructions (Addendum)

## 2017-01-19 ENCOUNTER — Other Ambulatory Visit: Payer: Self-pay

## 2017-01-19 ENCOUNTER — Ambulatory Visit (HOSPITAL_COMMUNITY): Payer: Medicare Other | Attending: Cardiovascular Disease

## 2017-01-19 DIAGNOSIS — I25118 Atherosclerotic heart disease of native coronary artery with other forms of angina pectoris: Secondary | ICD-10-CM | POA: Diagnosis not present

## 2017-01-20 ENCOUNTER — Telehealth: Payer: Self-pay | Admitting: *Deleted

## 2017-01-20 NOTE — Telephone Encounter (Signed)
Patient informed. 

## 2017-01-20 NOTE — Telephone Encounter (Signed)
-----   Message from Kathleene Hazelhristopher D McAlhany, MD sent at 01/19/2017  1:50 PM EDT ----- Echo shows normal LV systolic function. His heart is strong but relaxes poorly (diastolic dysfunction). No valve issues. Thayer Ohmhris

## 2017-01-21 ENCOUNTER — Encounter: Payer: Self-pay | Admitting: Internal Medicine

## 2017-01-21 ENCOUNTER — Ambulatory Visit (INDEPENDENT_AMBULATORY_CARE_PROVIDER_SITE_OTHER): Payer: Medicare Other | Admitting: Internal Medicine

## 2017-01-21 VITALS — BP 124/78 | HR 84 | Temp 97.8°F | Ht 70.75 in | Wt 215.4 lb

## 2017-01-21 DIAGNOSIS — E785 Hyperlipidemia, unspecified: Secondary | ICD-10-CM

## 2017-01-21 DIAGNOSIS — I2510 Atherosclerotic heart disease of native coronary artery without angina pectoris: Secondary | ICD-10-CM | POA: Diagnosis not present

## 2017-01-21 DIAGNOSIS — E1142 Type 2 diabetes mellitus with diabetic polyneuropathy: Secondary | ICD-10-CM

## 2017-01-21 DIAGNOSIS — E039 Hypothyroidism, unspecified: Secondary | ICD-10-CM | POA: Diagnosis not present

## 2017-01-21 DIAGNOSIS — Z0000 Encounter for general adult medical examination without abnormal findings: Secondary | ICD-10-CM | POA: Diagnosis not present

## 2017-01-21 DIAGNOSIS — R972 Elevated prostate specific antigen [PSA]: Secondary | ICD-10-CM

## 2017-01-21 DIAGNOSIS — I10 Essential (primary) hypertension: Secondary | ICD-10-CM

## 2017-01-21 LAB — CBC WITH DIFFERENTIAL/PLATELET
BASOS PCT: 0.7 % (ref 0.0–3.0)
Basophils Absolute: 0 10*3/uL (ref 0.0–0.1)
Eosinophils Absolute: 0.1 10*3/uL (ref 0.0–0.7)
Eosinophils Relative: 2 % (ref 0.0–5.0)
HEMATOCRIT: 39.1 % (ref 39.0–52.0)
Hemoglobin: 12.7 g/dL — ABNORMAL LOW (ref 13.0–17.0)
LYMPHS PCT: 28.9 % (ref 12.0–46.0)
Lymphs Abs: 1.9 10*3/uL (ref 0.7–4.0)
MCHC: 32.6 g/dL (ref 30.0–36.0)
MCV: 104.1 fl — AB (ref 78.0–100.0)
MONOS PCT: 9.6 % (ref 3.0–12.0)
Monocytes Absolute: 0.6 10*3/uL (ref 0.1–1.0)
NEUTROS ABS: 3.8 10*3/uL (ref 1.4–7.7)
Neutrophils Relative %: 58.8 % (ref 43.0–77.0)
PLATELETS: 258 10*3/uL (ref 150.0–400.0)
RBC: 3.75 Mil/uL — ABNORMAL LOW (ref 4.22–5.81)
RDW: 13.8 % (ref 11.5–15.5)
WBC: 6.4 10*3/uL (ref 4.0–10.5)

## 2017-01-21 LAB — COMPREHENSIVE METABOLIC PANEL
ALBUMIN: 4.2 g/dL (ref 3.5–5.2)
ALT: 26 U/L (ref 0–53)
AST: 22 U/L (ref 0–37)
Alkaline Phosphatase: 76 U/L (ref 39–117)
BUN: 24 mg/dL — AB (ref 6–23)
CHLORIDE: 102 meq/L (ref 96–112)
CO2: 30 mEq/L (ref 19–32)
CREATININE: 1.57 mg/dL — AB (ref 0.40–1.50)
Calcium: 9.8 mg/dL (ref 8.4–10.5)
GFR: 45.88 mL/min — ABNORMAL LOW (ref >60.00)
Glucose, Bld: 102 mg/dL — ABNORMAL HIGH (ref 70–99)
Potassium: 4.9 mEq/L (ref 3.5–5.1)
SODIUM: 136 meq/L (ref 135–145)
TOTAL PROTEIN: 5.9 g/dL — AB (ref 6.0–8.3)
Total Bilirubin: 0.5 mg/dL (ref 0.2–1.2)

## 2017-01-21 LAB — TSH: TSH: 7.69 u[IU]/mL — ABNORMAL HIGH (ref 0.35–4.50)

## 2017-01-21 LAB — HEMOGLOBIN A1C: Hgb A1c MFr Bld: 7.2 % — ABNORMAL HIGH (ref 4.6–6.5)

## 2017-01-21 NOTE — Patient Instructions (Signed)
Limit your sodium (Salt) intake    It is important that you exercise regularly, at least 20 minutes 3 to 4 times per week.  If you develop chest pain or shortness of breath seek  medical attention.  You need to lose weight.  Consider a lower calorie diet and regular exercise.  Return in 3 months for follow-up  

## 2017-01-21 NOTE — Progress Notes (Signed)
Subjective:    Patient ID: Richard Ho, male    DOB: 13-Aug-1940, 76 y.o.   MRN: [id number]  HPI 76 year old patient who is seen today for a preventive health examination and subsequent Medicare wellness visit He is doing fairly well.  [facility].  He has been followed closely by cardiology due to coronary artery disease as well as ophthalmology.  He is doing well.  His only complaints is been a bit off balance.  He does have a diabetic peripheral neuropathy.  He has had contact with a [employer] [facility] nurse who is engaged with the patient with a weight loss Program.  Past Medical History:  Diagnosis Date  . Asthma   . Coronary artery disease 1991   a) Angioplasty LAD 1991 [place] b) MI in 1995 at  [facility] in [place], med Rx  . Diabetes mellitus   . GERD (gastroesophageal reflux disease)   . Hearing loss   . HTN (hypertension)   . Hyperlipidemia   . Myocardial infarct (HCC)   . Nephrolithiasis   . Osteopenia   . Snake bite      Social History   Social History  . Marital status: Married    Spouse name: N/A  . Number of children: N/A  . Years of education: N/A   Occupational History  . Retired    Social History Main Topics  . Smoking status: Never Smoker  . Smokeless tobacco: Never Used  . Alcohol use 0.0 oz/week     Comment: occ, once every 2 months  . Drug use: No  . Sexual activity: Not on file   Other Topics Concern  . Not on file   Social History Narrative  . No narrative on file    Past Surgical History:  Procedure Laterality Date  . ANGIOPLASTY  1991  . CARDIAC CATHETERIZATION  2005   Moderate LCx and LAD disease and severe diffuse RCA disease  . CARDIAC CATHETERIZATION  01/2012   normal left main, 50% pLAD stenosis, dLAD mild luminal irregularities, D1 30-40% stenosis at take off; subtotally occluded/severely diseased OM1, mid 80% stenosis in small OM2, long prox and mid RCA stenoses  ranging from 60-95%, severely diseased PDA, distal PLSA occluded filling via L-R collateral; normal LV function; inferior lateral basal akinesis.  . CHOLECYSTECTOMY  2012  . EYE SURGERY  2010   cataract - right  . INGUINAL HERNIA REPAIR    . LEFT HEART CATHETERIZATION WITH CORONARY ANGIOGRAM N/A 01/28/2012   Procedure: LEFT HEART CATHETERIZATION WITH CORONARY ANGIOGRAM;  Surgeon: Iran OuchMuhammad A Arida, MD;  Location: [facility] C[facility];  Service: Cardiovascular;  Laterality: N/A;  . LEFT HEART CATHETERIZATION WITH CORONARY ANGIOGRAM N/A 03/17/2014   Procedure: LEFT HEART CATHETERIZATION WITH CORONARY ANGIOGRAM;  Surgeon: Kathleene Hazelhristopher D McAlhany, MD;  [facility];  Service: Cardiovascular;  Laterality: N/A;  . ORIF TIBIA & FIBULA FRACTURES    . PTCA    . VASECTOMY      Family History  Problem Relation Age of Onset  . Asthma Mother   . Allergies Sister   . Coronary artery disease Other     Allergies  Allergen Reactions  . Meloxicam Other (See Comments)    "jumping out of my skin" Pt feels anxiety attacks when on this medicine    Current Outpatient Prescriptions on File Prior to Visit  Medication Sig Dispense Refill  . aspirin EC 81 MG tablet Take 81 mg by mouth  daily.    . b complex vitamins capsule Take 1 capsule by mouth daily.    . beclomethasone (QVAR) 80 MCG/ACT inhaler Add 3 puffs 3 times daily with asthma flare-up 1 Inhaler 5  . BREO ELLIPTA 200-25 MCG/INH AEPB INHALE 1 PUFF BY MOUTH ONCE DAILY 60 each 5  . fluticasone (FLONASE) 50 MCG/ACT nasal spray USE TWO SPRAY(S) IN EACH NOSTRIL ONCE DAILY AS NEEDED FOR  ALLERGIES. 16 g 5  . gabapentin (NEURONTIN) 300 MG capsule Take 600 mg by mouth 2 (two) times daily.     Marland Kitchen glucose blood (PRECISION XTRA TEST STRIPS) test strip USE TO CHECK BLOOD SUGAR DAILY 100 each 3  . ipratropium (ATROVENT) 0.06 % nasal spray USE 2 SPRAY(S) IN EACH NOSTRIL EVERY 6 HOURS AS NEEDED FOR  RUNNY  NOSE 15 mL 5  . isosorbide mononitrate (IMDUR) 30 MG 24 hr  tablet TAKE ONE TABLET BY MOUTH TWICE DAILY 180 tablet 3  . levothyroxine (SYNTHROID, LEVOTHROID) 25 MCG tablet Take 25 mcg by mouth daily. Pt getting Rx from [place]    . Melatonin 3 MG CAPS Take 1 capsule by mouth at bedtime.    . metFORMIN (GLUCOPHAGE) 500 MG tablet Take 500 mg by mouth 2 (two) times daily with a meal.    . metoprolol (LOPRESSOR) 50 MG tablet Take 1 tablet (50 mg total) by mouth 2 (two) times daily. 180 tablet 3  . montelukast (SINGULAIR) 10 MG tablet TAKE 1 TABLET BY MOUTH ONCE DAILY 90 tablet 1  . nitroGLYCERIN (NITROSTAT) 0.4 MG SL tablet Place 1 tablet (0.4 mg total) under the tongue every 5 (five) minutes as needed. Chest pain 25 tablet 3  . nortriptyline (PAMELOR) 25 MG capsule TAKE ONE CAPSULE BY MOUTH AT BEDTIME 90 capsule 0  . ramipril (ALTACE) 5 MG capsule TAKE ONE CAPSULE BY MOUTH ONCE DAILY 90 capsule 3  . ranitidine (ZANTAC) 300 MG tablet Take 1 tablet (300 mg total) by mouth at bedtime. 30 tablet 5  . simvastatin (ZOCOR) 80 MG tablet Take 40 mg by mouth at bedtime. Rx from [place]    . tamsulosin (FLOMAX) 0.4 MG CAPS capsule TAKE ONE CAPSULE BY MOUTH DAILY 30 capsule 5  . traZODone (DESYREL) 50 MG tablet TAKE ONE-HALF TO ONE TABLET BY MOUTH AT BEDTIME AS NEEDED FOR SLEEP 30 tablet 3   No current facility-administered medications on file prior to visit.     BP 124/78 (BP Location: Left Arm, Patient Position: Sitting, Cuff Size: Normal)   Pulse 84   Temp 97.8 F (36.6 C) (Oral)   Ht 5' 10.75" (1.797 m)   Wt 215 lb 6.4 oz (97.7 kg)   SpO2 96%   BMI 30.25 kg/m    Subsequent Medicare wellness visit  1. Risk factors, based on past  M,S,F history-patient has known cardiac disease.  Status post PCI in the past.  Risk factors include hypertension, diabetes, and dyslipidemia  2.  Physical activities: No restrictions.  Has been in cardiac rehabilitation in the past.  Does have some mild balance issues that limits his activities.  Does use a treadmill 2 times per  week  3.  Depression/mood: No history of depression or mood disorder  4.  Hearing: Moderate deficits with tinnitus.  Has  been fitted for hearing aids at the [facility]  5.  ADL's: Totally independent  6.  Fall risk: Low .  Moderate due to unsteady gait  7.  Home safety: No problems identified  8.  Height weight,  and visual acuity; height and weight stable, status post recent left cataract extraction, surgery.  He states his vision is 20/20 on the left and corrected to 20/20 on the right with glasses.  Followed by Dr. Hazle Quant  9.  Counseling: Weight loss modest exercise regimen.  Encouraged.  Presently engaged with [facility] to assist with a weight loss program  10. Lab orders based on risk factors: Hemoglobin A1c will be reviewed.    11. Referral : Follow cardiology and urology as well as nephrology and ophthalmology  12. Care plan: Continue heart healthy diet and has had some modest weight loss.  Continued efforts at weight loss.    13. Cognitive assessment: Alert and appropriate.  Normal affect.  No cognitive dysfunction  14.  Preventive services will include annual health examinations with screening lab.  He'll continue quarterly hemoglobin A1c evaluations.  He'll be followed periodically by cardiology and will have annual eye examinations performed.  Renal indices will be followed at least twice annually. Patient was provided with a written and personalized care plan  15.  Provider list includes primary care ophthalmology, cardiology as well as urology as well as nephrology    Review of Systems  Constitutional: Negative for appetite change, chills, fatigue and fever.  HENT: Negative for congestion, dental problem, ear pain, hearing loss, sore throat, tinnitus, trouble swallowing and voice change.   Eyes: Negative for pain, discharge and visual disturbance.  Respiratory: Negative for cough, chest tightness, wheezing and stridor.   Cardiovascular:  Negative for chest pain, palpitations and leg swelling.  Gastrointestinal: Negative for abdominal distention, abdominal pain, blood in stool, constipation, diarrhea, nausea and vomiting.  Genitourinary: Negative for difficulty urinating, discharge, flank pain, genital sores, hematuria and urgency.  Musculoskeletal: Positive for gait problem. Negative for arthralgias, back pain, joint swelling, myalgias and neck stiffness.  Skin: Negative for rash.  Neurological: Negative for dizziness, syncope, speech difficulty, weakness, numbness and headaches.  Hematological: Negative for adenopathy. Does not bruise/bleed easily.  Psychiatric/Behavioral: Negative for behavioral problems and dysphoric mood. The patient is not nervous/anxious.        Objective:   Physical Exam  Constitutional: He appears well-developed and well-nourished.  Weight 2:15, blood pressure 118/76  HENT:  Head: Normocephalic and atraumatic.  Right Ear: External ear normal.  Left Ear: External ear normal.  Nose: Nose normal.  Mouth/Throat: Oropharynx is clear and moist.  Eyes: Pupils are equal, round, and reactive to light. Conjunctivae and EOM are normal. No scleral icterus.  Neck: Normal range of motion. Neck supple. No JVD present. No thyromegaly present.  Cardiovascular: Regular rhythm, normal heart sounds and intact distal pulses.  Exam reveals no gallop and no friction rub.   No murmur heard. Pulmonary/Chest: Effort normal and breath sounds normal. He exhibits no tenderness.  Abdominal: Soft. Bowel sounds are normal. He exhibits no distension and no mass. There is no tenderness.  Genitourinary: Prostate normal and penis normal. Rectal exam shows guaiac negative stool.  Musculoskeletal: Normal range of motion. He exhibits no edema or tenderness.  Lymphadenopathy:    He has no cervical adenopathy.  Neurological: He is alert. He has normal reflexes. No cranial nerve deficit. Coordination normal.  Absent vibratory  sensation and monofilament testing distally  Skin: Skin is warm and dry. No rash noted.  Soft tissue fullness right axilla consistent with a lipoma  Psychiatric: He has a normal mood and affect. His behavior is normal.          Assessment &  Plan:   Preventive health examination Subsequent Medicare wellness visit Coronary artery disease, stable Obesity.  Weight loss encouraged.  Patient has started a weight loss program Essential hypertension, well-controlled Dyslipidemia.  Continue statin therapy Diabetes mellitus.  Will review a hemoglobin A1c Diabetic peripheral neuropathy Diabetic nephropathy History of asthma, stable  Follow-up 3 months Review screening lab Weight loss encouraged We'll rigorous exercise recommended  Rogelia Boga

## 2017-01-26 MED ORDER — LEVOTHYROXINE SODIUM 50 MCG PO TABS: 50.0000 ug | ORAL_TABLET | Freq: Every day | ORAL | 3 refills | Status: DC

## 2017-01-26 NOTE — Addendum Note (Signed)
Addended by: Neville Route on: 01/26/2017 01:15 PM   Modules accepted: Orders

## 2017-01-27 ENCOUNTER — Encounter: Payer: Self-pay | Admitting: Internal Medicine

## 2017-01-27 ENCOUNTER — Ambulatory Visit (INDEPENDENT_AMBULATORY_CARE_PROVIDER_SITE_OTHER): Payer: Medicare Other | Admitting: Internal Medicine

## 2017-01-27 VITALS — BP 120/62 | HR 84 | Temp 97.6°F | Ht 70.75 in | Wt 218.0 lb

## 2017-01-27 DIAGNOSIS — I10 Essential (primary) hypertension: Secondary | ICD-10-CM

## 2017-01-27 DIAGNOSIS — E1121 Type 2 diabetes mellitus with diabetic nephropathy: Secondary | ICD-10-CM | POA: Diagnosis not present

## 2017-01-27 NOTE — Patient Instructions (Signed)
Limit your sodium (Salt) intake   Please check your hemoglobin A1c every 3 months    It is important that you exercise regularly, at least 20 minutes 3 to 4 times per week.  If you develop chest pain or shortness of breath seek  medical attention.  You need to lose weight.  Consider a lower calorie diet and regular exercise. 

## 2017-01-27 NOTE — Progress Notes (Signed)
Subjective:    Patient ID: Richard Ho, male    DOB: March 09, 1941, 76 y.o.   MRN: [id number]  HPI 76 year old patient who is seen today in follow-up.  He was recently seen and had follow-up lab  Lab Results  Component Value Date   HGBA1C 7.2 (H) 01/21/2017   Hemoglobin A1c had increased from 6.9 previously.  Better diet, weight loss and exercise.  All encouraged.  TSH was also slightly elevated. He states that he has been compliant with levothyroxine supplementation.  Medication increased from 25 to 50 g. Renal indices were stable.  He is scheduled for nephrology follow-up at the [facility]  Past Medical History:  Diagnosis Date  . Asthma   . Coronary artery disease 1991   a) Angioplasty LAD 1991 [place] b) MI in 1995 at  [facility] in [place], med Rx  . Diabetes mellitus   . GERD (gastroesophageal reflux disease)   . Hearing loss   . HTN (hypertension)   . Hyperlipidemia   . Myocardial infarct (HCC)   . Nephrolithiasis   . Osteopenia   . Snake bite      Social History   Social History  . Marital status: Married    Spouse name: N/A  . Number of children: N/A  . Years of education: N/A   Occupational History  . Retired    Social History Main Topics  . Smoking status: Never Smoker  . Smokeless tobacco: Never Used  . Alcohol use 0.0 oz/week     Comment: occ, once every 2 months  . Drug use: No  . Sexual activity: Not on file   Other Topics Concern  . Not on file   Social History Narrative  . No narrative on file    Past Surgical History:  Procedure Laterality Date  . ANGIOPLASTY  1991  . CARDIAC CATHETERIZATION  2005   Moderate LCx and LAD disease and severe diffuse RCA disease  . CARDIAC CATHETERIZATION  01/2012   normal left main, 50% pLAD stenosis, dLAD mild luminal irregularities, D1 30-40% stenosis at take off; subtotally occluded/severely diseased OM1, mid 80% stenosis in small OM2, long prox and mid RCA stenoses ranging from 60-95%, severely  diseased PDA, distal PLSA occluded filling via L-R collateral; normal LV function; inferior lateral basal akinesis.  . CHOLECYSTECTOMY  2012  . EYE SURGERY  2010   cataract - right  . INGUINAL HERNIA REPAIR    . LEFT HEART CATHETERIZATION WITH CORONARY ANGIOGRAM N/A 01/28/2012   Procedure: LEFT HEART CATHETERIZATION WITH CORONARY ANGIOGRAM;  Surgeon: Iran Ouch, MD;  Location: [facility] CATH LAB;  Service: Cardiovascular;  Laterality: N/A;  . LEFT HEART CATHETERIZATION WITH CORONARY ANGIOGRAM N/A 03/17/2014   Procedure: LEFT HEART CATHETERIZATION WITH CORONARY ANGIOGRAM;  Surgeon: Kathleene Hazel, MD;  Location: [facility] CATH LAB;  Service: Cardiovascular;  Laterality: N/A;  . ORIF TIBIA & FIBULA FRACTURES    . PTCA    . VASECTOMY      Family History  Problem Relation Age of Onset  . Asthma Mother   . Allergies Sister   . Coronary artery disease Other     Allergies  Allergen Reactions  . Meloxicam Other (See Comments)    "jumping out of my skin" Pt feels anxiety attacks when on this medicine    Current Outpatient Prescriptions on File Prior to Visit  Medication Sig Dispense Refill  . aspirin EC 81 MG tablet Take 81 mg by mouth daily.    Marland Kitchen b  complex vitamins capsule Take 1 capsule by mouth daily.    . beclomethasone (QVAR) 80 MCG/ACT inhaler Add 3 puffs 3 times daily with asthma flare-up 1 Inhaler 5  . BREO ELLIPTA 200-25 MCG/INH AEPB INHALE 1 PUFF BY MOUTH ONCE DAILY 60 each 5  . fluticasone (FLONASE) 50 MCG/ACT nasal spray USE TWO SPRAY(S) IN EACH NOSTRIL ONCE DAILY AS NEEDED FOR  ALLERGIES. 16 g 5  . gabapentin (NEURONTIN) 300 MG capsule Take 600 mg by mouth 2 (two) times daily.     Marland Kitchen glucose blood (PRECISION XTRA TEST STRIPS) test strip USE TO CHECK BLOOD SUGAR DAILY 100 each 3  . ipratropium (ATROVENT) 0.06 % nasal spray USE 2 SPRAY(S) IN EACH NOSTRIL EVERY 6 HOURS AS NEEDED FOR  RUNNY  NOSE 15 mL 5  . isosorbide mononitrate (IMDUR) 30 MG 24 hr tablet TAKE ONE TABLET BY MOUTH  TWICE DAILY 180 tablet 3  . levothyroxine (SYNTHROID, LEVOTHROID) 50 MCG tablet Take 1 tablet (50 mcg total) by mouth daily. 90 tablet 3  . Melatonin 3 MG CAPS Take 1 capsule by mouth at bedtime.    . metFORMIN (GLUCOPHAGE) 500 MG tablet Take 500 mg by mouth 2 (two) times daily with a meal.    . metoprolol (LOPRESSOR) 50 MG tablet Take 1 tablet (50 mg total) by mouth 2 (two) times daily. 180 tablet 3  . montelukast (SINGULAIR) 10 MG tablet TAKE 1 TABLET BY MOUTH ONCE DAILY 90 tablet 1  . nitroGLYCERIN (NITROSTAT) 0.4 MG SL tablet Place 1 tablet (0.4 mg total) under the tongue every 5 (five) minutes as needed. Chest pain 25 tablet 3  . nortriptyline (PAMELOR) 25 MG capsule TAKE ONE CAPSULE BY MOUTH AT BEDTIME 90 capsule 0  . ramipril (ALTACE) 5 MG capsule TAKE ONE CAPSULE BY MOUTH ONCE DAILY 90 capsule 3  . simvastatin (ZOCOR) 80 MG tablet Take 40 mg by mouth at bedtime. Rx from [place]    . tamsulosin (FLOMAX) 0.4 MG CAPS capsule TAKE ONE CAPSULE BY MOUTH DAILY 30 capsule 5  . traZODone (DESYREL) 50 MG tablet TAKE ONE-HALF TO ONE TABLET BY MOUTH AT BEDTIME AS NEEDED FOR SLEEP 30 tablet 3  . ranitidine (ZANTAC) 300 MG tablet Take 1 tablet (300 mg total) by mouth at bedtime. (Patient not taking: Reported on 01/27/2017) 30 tablet 5   No current facility-administered medications on file prior to visit.     BP 120/62 (BP Location: Left Arm, Patient Position: Sitting, Cuff Size: Normal)   Pulse 84   Temp 97.6 F (36.4 C) (Oral)   Ht 5' 10.75" (1.797 m)   Wt 218 lb (98.9 kg)   SpO2 95%   BMI 30.62 kg/m     Review of Systems  Constitutional: Positive for unexpected weight change. Negative for appetite change, chills, fatigue and fever.  HENT: Negative for congestion, dental problem, ear pain, hearing loss, sore throat, tinnitus, trouble swallowing and voice change.   Eyes: Negative for pain, discharge and visual disturbance.  Respiratory: Negative for cough, chest tightness, wheezing and stridor.    Cardiovascular: Negative for chest pain, palpitations and leg swelling.  Gastrointestinal: Negative for abdominal distention, abdominal pain, blood in stool, constipation, diarrhea, nausea and vomiting.  Genitourinary: Negative for difficulty urinating, discharge, flank pain, genital sores, hematuria and urgency.  Musculoskeletal: Negative for arthralgias, back pain, gait problem, joint swelling, myalgias and neck stiffness.  Skin: Negative for rash.  Neurological: Negative for dizziness, syncope, speech difficulty, weakness, numbness and headaches.  Hematological: Negative for  adenopathy. Does not bruise/bleed easily.  Psychiatric/Behavioral: Negative for behavioral problems and dysphoric mood. The patient is not nervous/anxious.        Objective:   Physical Exam  Constitutional: He appears well-developed and well-nourished. No distress.  Blood pressure low normal Weight 218          Assessment & Plan:   Diabetes mellitus.  Lifestyle issues discussed Elevated TSH.  Levothyroxine dose adjusted.  We'll recheck TSH in 3 months Hypertension, stable Chronic kidney disease, stable.  Follow-up nephrology  Rogelia Boga

## 2017-02-09 ENCOUNTER — Other Ambulatory Visit: Payer: Self-pay | Admitting: Internal Medicine

## 2017-02-09 ENCOUNTER — Other Ambulatory Visit: Payer: Self-pay | Admitting: Allergy and Immunology

## 2017-02-10 ENCOUNTER — Telehealth: Payer: Self-pay | Admitting: Allergy and Immunology

## 2017-02-10 MED ORDER — FLUTICASONE PROPIONATE HFA 110 MCG/ACT IN AERO: 2.0000 | INHALATION_SPRAY | Freq: Two times a day (BID) | RESPIRATORY_TRACT | 5 refills | Status: DC

## 2017-02-10 MED ORDER — BECLOMETHASONE DIPROP HFA 80 MCG/ACT IN AERB: 3.0000 | INHALATION_SPRAY | Freq: Three times a day (TID) | RESPIRATORY_TRACT | 3 refills | Status: DC | PRN

## 2017-02-10 NOTE — Telephone Encounter (Signed)
I have updated the medication list to reflect the most current office note. I have also discontinued the Flovent and Qvar HFA inhalers. New prescription sent for Qvar Redihaler

## 2017-02-10 NOTE — Telephone Encounter (Signed)
Pharmacy has a question about the prescription that was sent over for Qvar.

## 2017-02-10 NOTE — Telephone Encounter (Signed)
Needs Breo Daily. Action plan is to add QVAR OR FaLOVENT, whatever his insurance will cover.

## 2017-02-10 NOTE — Telephone Encounter (Signed)
I called the pharmacy and Flovent 110 2 puffs bid was sent in as well as the Qvar 80 3 puffs tid. Per your last note you wanted patient to add Qvar Redihaler 80 mcg 3 puff tid prn as part of the asthma action plan. Did you want patient on another maintenance other than the Breo. Please advise and thank you.

## 2017-02-11 ENCOUNTER — Other Ambulatory Visit: Payer: Self-pay | Admitting: *Deleted

## 2017-02-11 MED ORDER — FLUTICASONE PROPIONATE HFA 110 MCG/ACT IN AERO: 2.0000 | INHALATION_SPRAY | Freq: Two times a day (BID) | RESPIRATORY_TRACT | 5 refills | Status: DC

## 2017-02-16 NOTE — Telephone Encounter (Signed)
Spoke with patient and advised that the Flovent is 3 inhalations tid.

## 2017-02-16 NOTE — Addendum Note (Signed)
Addended by: Mliss FritzBLACK, Nel Stoneking I on: 02/16/2017 09:51 AM   Modules accepted: Orders

## 2017-02-26 ENCOUNTER — Encounter: Payer: Self-pay | Admitting: Internal Medicine

## 2017-03-02 ENCOUNTER — Other Ambulatory Visit: Payer: Self-pay | Admitting: Allergy and Immunology

## 2017-03-13 ENCOUNTER — Telehealth: Payer: Self-pay | Admitting: Allergy and Immunology

## 2017-03-13 NOTE — Telephone Encounter (Signed)
Patient needs a spacer for his inhaler Does the office have one or does a script need to be sent to his pharmacy? Please call

## 2017-03-13 NOTE — Telephone Encounter (Signed)
Informed patient that spacer will be up front for pick up.

## 2017-03-13 NOTE — Telephone Encounter (Signed)
Due to insurance coverage, will give pt a sample. [employer] paperwork discarded.

## 2017-03-13 NOTE — Telephone Encounter (Signed)
Paperwork has been prepared with [employer]. Pt will come by to sign forms and receive his spacer.

## 2017-03-16 ENCOUNTER — Encounter: Payer: Self-pay | Admitting: Internal Medicine

## 2017-03-16 ENCOUNTER — Ambulatory Visit (INDEPENDENT_AMBULATORY_CARE_PROVIDER_SITE_OTHER): Payer: Medicare Other | Admitting: Internal Medicine

## 2017-03-16 VITALS — BP 138/68 | HR 116 | Temp 97.9°F | Ht 70.5 in | Wt 221.4 lb

## 2017-03-16 DIAGNOSIS — I10 Essential (primary) hypertension: Secondary | ICD-10-CM

## 2017-03-16 DIAGNOSIS — J4520 Mild intermittent asthma, uncomplicated: Secondary | ICD-10-CM

## 2017-03-16 DIAGNOSIS — J069 Acute upper respiratory infection, unspecified: Secondary | ICD-10-CM | POA: Diagnosis not present

## 2017-03-16 NOTE — Progress Notes (Signed)
Subjective:    Patient ID: Richard Ho, male    DOB: 24-Sep-1940, 76 y.o.   MRN: [id number]  HPI  Lab Results  Component Value Date   HGBA1C 7.2 (H) 01/21/2017   76 year old patient has a history of allergic rhinitis and asthma.  He is on multiple maintenance medications.  For the past 3 days she has had hoarseness, chest congestion and cough.  The hoarseness and sore throat have improved.  No active wheezing. He is followed by allergy medicine  Past Medical History:  Diagnosis Date  . Asthma   . Coronary artery disease 1991   a) Angioplasty LAD 1991 [place] b) MI in 1995 at  [facility] in [place], med Rx  . Diabetes mellitus   . GERD (gastroesophageal reflux disease)   . Hearing loss   . HTN (hypertension)   . Hyperlipidemia   . Myocardial infarct (HCC)   . Nephrolithiasis   . Osteopenia   . Snake bite      Social History   Social History  . Marital status: Married    Spouse name: N/A  . Number of children: N/A  . Years of education: N/A   Occupational History  . Retired    Social History Main Topics  . Smoking status: Never Smoker  . Smokeless tobacco: Never Used  . Alcohol use 0.0 oz/week     Comment: occ, once every 2 months  . Drug use: No  . Sexual activity: Not on file   Other Topics Concern  . Not on file   Social History Narrative  . No narrative on file    Past Surgical History:  Procedure Laterality Date  . ANGIOPLASTY  1991  . CARDIAC CATHETERIZATION  2005   Moderate LCx and LAD disease and severe diffuse RCA disease  . CARDIAC CATHETERIZATION  01/2012   normal left main, 50% pLAD stenosis, dLAD mild luminal irregularities, D1 30-40% stenosis at take off; subtotally occluded/severely diseased OM1, mid 80% stenosis in small OM2, long prox and mid RCA stenoses ranging from 60-95%, severely diseased PDA, distal PLSA occluded filling via L-R collateral; normal LV function; inferior lateral basal akinesis.  . CHOLECYSTECTOMY  2012  .  EYE SURGERY  2010   cataract - right  . INGUINAL HERNIA REPAIR    . LEFT HEART CATHETERIZATION WITH CORONARY ANGIOGRAM N/A 01/28/2012   Procedure: LEFT HEART CATHETERIZATION WITH CORONARY ANGIOGRAM;  Surgeon: Iran Ouch, MD;  Location: [facility] CATH LAB;  Service: Cardiovascular;  Laterality: N/A;  . LEFT HEART CATHETERIZATION WITH CORONARY ANGIOGRAM N/A 03/17/2014   Procedure: LEFT HEART CATHETERIZATION WITH CORONARY ANGIOGRAM;  Surgeon: Kathleene Hazel, MD;  Location: [facility] C[facility] LAB;  Service: Cardiovascular;  Laterality: N/A;  . ORIF TIBIA & FIBULA FRACTURES    . PTCA    . VASECTOMY      Family History  Problem Relation Age of Onset  . Asthma Mother   . Allergies Sister   . Coronary artery disease Other     Allergies  Allergen Reactions  . Meloxicam Other (See Comments)    "jumping out of my skin" Pt feels anxiety attacks when on this medicine    Current Outpatient Prescriptions on File Prior to Visit  Medication Sig Dispense Refill  . aspirin EC 81 MG tablet Take 81 mg by mouth daily.    Marland Kitchen b complex vitamins capsule Take 1 capsule by mouth daily.    Marland Kitchen BREO ELLIPTA 200-25 MCG/INH AEPB INHALE 1 PUFF BY MOUTH  ONCE DAILY 60 each 5  . fluticasone (FLONASE) 50 MCG/ACT nasal spray USE TWO SPRAY(S) IN EACH NOSTRIL ONCE DAILY AS NEEDED FOR  ALLERGIES. 16 g 5  . gabapentin (NEURONTIN) 300 MG capsule Take 600 mg by mouth 2 (two) times daily.     Marland Kitchen glucose blood (PRECISION XTRA TEST STRIPS) test strip USE TO CHECK BLOOD SUGAR DAILY 100 each 3  . ipratropium (ATROVENT) 0.06 % nasal spray USE 2 SPRAY(S) IN EACH NOSTRIL EVERY 6 HOURS AS NEEDED FOR  RUNNY  NOSE 15 mL 5  . isosorbide mononitrate (IMDUR) 30 MG 24 hr tablet TAKE ONE TABLET BY MOUTH TWICE DAILY 180 tablet 3  . levothyroxine (SYNTHROID, LEVOTHROID) 50 MCG tablet Take 1 tablet (50 mcg total) by mouth daily. 90 tablet 3  . Melatonin 3 MG CAPS Take 1 capsule by mouth at bedtime.    . metFORMIN (GLUCOPHAGE) 500 MG tablet Take 500  mg by mouth 2 (two) times daily with a meal.    . metoprolol (LOPRESSOR) 50 MG tablet Take 1 tablet (50 mg total) by mouth 2 (two) times daily. 180 tablet 3  . montelukast (SINGULAIR) 10 MG tablet TAKE 1 TABLET BY MOUTH ONCE DAILY 90 tablet 1  . nitroGLYCERIN (NITROSTAT) 0.4 MG SL tablet Place 1 tablet (0.4 mg total) under the tongue every 5 (five) minutes as needed. Chest pain 25 tablet 3  . nortriptyline (PAMELOR) 25 MG capsule TAKE ONE CAPSULE BY MOUTH AT BEDTIME 90 capsule 0  . pantoprazole (PROTONIX) 20 MG tablet Take 20 mg by mouth daily.    . ramipril (ALTACE) 5 MG capsule TAKE ONE CAPSULE BY MOUTH ONCE DAILY 90 capsule 3  . ranitidine (ZANTAC) 300 MG tablet TAKE ONE TABLET BY MOUTH ONCE DAILY AT BEDTIME 30 tablet 3  . simvastatin (ZOCOR) 80 MG tablet Take 40 mg by mouth at bedtime. Rx from [place]    . tamsulosin (FLOMAX) 0.4 MG CAPS capsule TAKE ONE CAPSULE BY MOUTH DAILY 30 capsule 5  . traZODone (DESYREL) 50 MG tablet TAKE ONE-HALF TO ONE TABLET BY MOUTH AT BEDTIME AS NEEDED FOR SLEEP 30 tablet 3   No current facility-administered medications on file prior to visit.     BP (!) 174/68 (BP Location: Left Arm, Patient Position: Sitting, Cuff Size: Normal)   Pulse (!) 116   Temp 97.9 F (36.6 C) (Oral)   Ht 5' 10.5" (1.791 m)   Wt 221 lb 6.4 oz (100.4 kg)   SpO2 94%   BMI 31.32 kg/m    Review of Systems  Constitutional: Positive for activity change, appetite change and fatigue. Negative for chills and fever.  HENT: Positive for congestion, sore throat and voice change. Negative for dental problem, ear pain, hearing loss, tinnitus and trouble swallowing.   Eyes: Negative for pain, discharge and visual disturbance.  Respiratory: Positive for cough. Negative for chest tightness, wheezing and stridor.   Cardiovascular: Negative for chest pain, palpitations and leg swelling.  Gastrointestinal: Negative for abdominal distention, abdominal pain, blood in stool, constipation, diarrhea,  nausea and vomiting.  Genitourinary: Negative for difficulty urinating, discharge, flank pain, genital sores, hematuria and urgency.  Musculoskeletal: Negative for arthralgias, back pain, gait problem, joint swelling, myalgias and neck stiffness.  Skin: Negative for rash.  Neurological: Negative for dizziness, syncope, speech difficulty, weakness, numbness and headaches.  Hematological: Negative for adenopathy. Does not bruise/bleed easily.  Psychiatric/Behavioral: Negative for behavioral problems and dysphoric mood. The patient is not nervous/anxious.  Objective:   Physical Exam  Constitutional: He is oriented to person, place, and time. He appears well-developed.  HENT:  Head: Normocephalic.  Right Ear: External ear normal.  Left Ear: External ear normal.  Eyes: Conjunctivae and EOM are normal.  Neck: Normal range of motion.  Cardiovascular: Normal rate and normal heart sounds.   Pulse on arrival 116 Present pulse 90-100  Pulmonary/Chest: Breath sounds normal. No respiratory distress. He has no wheezes. He has no rales.  No wheezing No distress  Abdominal: Bowel sounds are normal.  Musculoskeletal: Normal range of motion. He exhibits no edema or tenderness.  Neurological: He is alert and oriented to person, place, and time.  Psychiatric: He has a normal mood and affect. His behavior is normal.          Assessment & Plan:   Viral URI Allergic rhinitis Chronic asthma Diabetes mellitus.  Follow-up next month as scheduled Essential hypertension.  Repeat blood pressure 138/ 68  We'll continue maintenance medications Encourage hydration  Rogelia Boga

## 2017-03-16 NOTE — Patient Instructions (Addendum)
Drink as much fluid as you  can tolerate over the next few days  Acute bronchitis symptoms are generally not helped by antibiotics.  Take over-the-counter expectorants and cough medications such as  Mucinex DM.  Call if there is no improvement in 5 to 7 days or if  you develop worsening cough, fever, or new symptoms, such as shortness of breath or chest pain.  Hydrate and Humidify  Drink enough water to keep your urine clear or pale yellow. Staying hydrated will help to thin your mucus.  Use a cool mist humidifier to keep the humidity level in your home above 50%.  Inhale steam for 10-15 minutes, 3-4 times a day or as told by your health care provider. You can do this in the bathroom while a hot shower is running.  Limit your exposure to cool or dry air. Rest  Rest as much as possible.

## 2017-04-27 ENCOUNTER — Encounter: Payer: Self-pay | Admitting: Internal Medicine

## 2017-04-27 ENCOUNTER — Ambulatory Visit: Payer: Medicare Other | Admitting: Internal Medicine

## 2017-04-27 VITALS — BP 122/68 | HR 83 | Temp 98.6°F | Ht 70.0 in | Wt 222.6 lb

## 2017-04-27 DIAGNOSIS — E1142 Type 2 diabetes mellitus with diabetic polyneuropathy: Secondary | ICD-10-CM

## 2017-04-27 DIAGNOSIS — E0821 Diabetes mellitus due to underlying condition with diabetic nephropathy: Secondary | ICD-10-CM

## 2017-04-27 DIAGNOSIS — I10 Essential (primary) hypertension: Secondary | ICD-10-CM

## 2017-04-27 DIAGNOSIS — I2510 Atherosclerotic heart disease of native coronary artery without angina pectoris: Secondary | ICD-10-CM | POA: Diagnosis not present

## 2017-04-27 DIAGNOSIS — E1122 Type 2 diabetes mellitus with diabetic chronic kidney disease: Secondary | ICD-10-CM | POA: Diagnosis not present

## 2017-04-27 DIAGNOSIS — N189 Chronic kidney disease, unspecified: Secondary | ICD-10-CM | POA: Diagnosis not present

## 2017-04-27 DIAGNOSIS — E038 Other specified hypothyroidism: Secondary | ICD-10-CM | POA: Diagnosis not present

## 2017-04-27 LAB — HEMOGLOBIN A1C: Hgb A1c MFr Bld: 7.2 % — ABNORMAL HIGH (ref 4.6–6.5)

## 2017-04-27 LAB — TSH: TSH: 5.69 u[IU]/mL — AB (ref 0.35–4.50)

## 2017-04-27 NOTE — Progress Notes (Signed)
   Subjective:    Patient ID: Richard SchoolLaurence S Almond, male    DOB: 01-Jun-1941, 76 y.o.   MRN: [id number]  HPI Lab Results  Component Value Date   HGBA1C 7.2 (H) 01/21/2017     Review of Systems     Objective:   Physical Exam        Assessment & Plan:

## 2017-04-27 NOTE — Patient Instructions (Signed)
Limit your sodium (Salt) intake    It is important that you exercise regularly, at least 20 minutes 3 to 4 times per week.  If you develop chest pain or shortness of breath seek  medical attention.   Please check your hemoglobin A1c every 3-6  Months  Take a calcium supplement, plus [phone] units of vitamin D

## 2017-04-27 NOTE — Progress Notes (Signed)
Subjective:    Patient ID: Richard Ho, male    DOB: 11-06-1940, 76 y.o.   MRN: [id number]  HPI 76 year old patient who is seen today for follow-up.  [facility] and has had recent laboratory studies performed.  He is followed by nephrology.  His chronic kidney disease has been stable.  Laboratory studies were reviewed.  He has mild secondary anemia.  He was also diagnosed with vitamin D deficiency and has been on supplements. He has diabetes complicated by peripheral neuropathy.  He is fearful that he will no longer be able to drive due to significant numbness involving both feet He has had a recent URI with flare of reflux symptoms and he is back on PPI therapy short-term.  His symptoms have largely resolved. No new concerns or complaints.  Laboratory studies from 3 months ago were reviewed.  TSH was slightly elevated and levothyroxine dose adjusted.  Past Medical History:  Diagnosis Date  . Asthma   . Coronary artery disease 1991   a) Angioplasty LAD 1991 [place] b) MI in 1995 at  [facility] in [place], med Rx  . Diabetes mellitus   . GERD (gastroesophageal reflux disease)   . Hearing loss   . HTN (hypertension)   . Hyperlipidemia   . Myocardial infarct (HCC)   . Nephrolithiasis   . Osteopenia   . Snake bite      Social History   Socioeconomic History  . Marital status: Married    Spouse name: Not on file  . Number of children: Not on file  . Years of education: Not on file  . Highest education level: Not on file  Social Needs  . Financial resource strain: Not on file  . Food insecurity - worry: Not on file  . Food insecurity - inability: Not on file  . Transportation needs - medical: Not on file  . Transportation needs - non-medical: Not on file  Occupational History  . Occupation: Retired  Tobacco Use  . Smoking status: Never Smoker  . Smokeless tobacco: Never Used  Substance and Sexual Activity  . Alcohol use: Yes   Alcohol/week: 0.0 oz    Comment: occ, once every 2 months  . Drug use: No  . Sexual activity: Not on file  Other Topics Concern  . Not on file  Social History Narrative  . Not on file    Past Surgical History:  Procedure Laterality Date  . ANGIOPLASTY  1991  . CARDIAC CATHETERIZATION  2005   Moderate LCx and LAD disease and severe diffuse RCA disease  . CARDIAC CATHETERIZATION  01/2012   normal left main, 50% pLAD stenosis, dLAD mild luminal irregularities, D1 30-40% stenosis at take off; subtotally occluded/severely diseased OM1, mid 80% stenosis in small OM2, long prox and mid RCA stenoses ranging from 60-95%, severely diseased PDA, distal PLSA occluded filling via L-R collateral; normal LV function; inferior lateral basal akinesis.  . CHOLECYSTECTOMY  2012  . EYE SURGERY  2010   cataract - right  . INGUINAL HERNIA REPAIR    . LEFT HEART CATHETERIZATION WITH CORONARY ANGIOGRAM N/A 03/17/2014   Performed by Kathleene HazelMcAlhany, Christopher D, [facility]  . LEFT HEART CATHETERIZATION WITH CORONARY ANGIOGRAM N/A 01/28/2012   Performed by Iran OuchArida, Muhammad A[facility]  . ORIF TIBIA & FIBULA FRACTURES    . PTCA    . VASECTOMY      Family History  Problem Relation  Age of Onset  . Asthma Mother   . Allergies Sister   . Coronary artery disease Other     Allergies  Allergen Reactions  . Meloxicam Other (See Comments)    "jumping out of my skin" Pt feels anxiety attacks when on this medicine    Current Outpatient Medications on File Prior to Visit  Medication Sig Dispense Refill  . aspirin EC 81 MG tablet Take 81 mg by mouth daily.    Marland Kitchen. b complex vitamins capsule Take 1 capsule by mouth daily.    Marland Kitchen. BREO ELLIPTA 200-25 MCG/INH AEPB INHALE 1 PUFF BY MOUTH ONCE DAILY 60 each 5  . fluticasone (FLONASE) 50 MCG/ACT nasal spray USE TWO SPRAY(S) IN EACH NOSTRIL ONCE DAILY AS NEEDED FOR  ALLERGIES. 16 g 5  . gabapentin (NEURONTIN) 300 MG capsule Take 600 mg by mouth 2 (two) times  daily.     Marland Kitchen. glucose blood (PRECISION XTRA TEST STRIPS) test strip USE TO CHECK BLOOD SUGAR DAILY 100 each 3  . ipratropium (ATROVENT) 0.06 % nasal spray USE 2 SPRAY(S) IN EACH NOSTRIL EVERY 6 HOURS AS NEEDED FOR  RUNNY  NOSE 15 mL 5  . isosorbide mononitrate (IMDUR) 30 MG 24 hr tablet TAKE ONE TABLET BY MOUTH TWICE DAILY 180 tablet 3  . levothyroxine (SYNTHROID, LEVOTHROID) 50 MCG tablet Take 1 tablet (50 mcg total) by mouth daily. 90 tablet 3  . Melatonin 3 MG CAPS Take 1 capsule by mouth at bedtime.    . metFORMIN (GLUCOPHAGE) 500 MG tablet Take 500 mg by mouth 2 (two) times daily with a meal.    . metoprolol (LOPRESSOR) 50 MG tablet Take 1 tablet (50 mg total) by mouth 2 (two) times daily. 180 tablet 3  . montelukast (SINGULAIR) 10 MG tablet TAKE 1 TABLET BY MOUTH ONCE DAILY 90 tablet 1  . nitroGLYCERIN (NITROSTAT) 0.4 MG SL tablet Place 1 tablet (0.4 mg total) under the tongue every 5 (five) minutes as needed. Chest pain 25 tablet 3  . nortriptyline (PAMELOR) 25 MG capsule TAKE ONE CAPSULE BY MOUTH AT BEDTIME 90 capsule 0  . pantoprazole (PROTONIX) 20 MG tablet Take 20 mg by mouth daily.    . ramipril (ALTACE) 5 MG capsule TAKE ONE CAPSULE BY MOUTH ONCE DAILY 90 capsule 3  . ranitidine (ZANTAC) 300 MG tablet TAKE ONE TABLET BY MOUTH ONCE DAILY AT BEDTIME 30 tablet 3  . simvastatin (ZOCOR) 80 MG tablet Take 40 mg by mouth at bedtime. [place] [facility]    . tamsulosin (FLOMAX) 0.4 MG CAPS capsule TAKE ONE CAPSULE BY MOUTH DAILY 30 capsule 5  . traZODone (DESYREL) 50 MG tablet TAKE ONE-HALF TO ONE TABLET BY MOUTH AT BEDTIME AS NEEDED FOR SLEEP 30 tablet 3   No current facility-administered medications on file prior to visit.     BP 122/68 (BP Location: Left Arm, Patient Position: Sitting, Cuff Size: Normal)   Pulse 83   Temp 98.6 F (37 C) (Oral)   Ht 5\' 10"  (1.778 m)   Wt 222 lb 9.6 oz (101 kg)   SpO2 97%   BMI 31.94 kg/m      Review of Systems  Constitutional: Negative for appetite  change, chills, fatigue and fever.  HENT: Negative for congestion, dental problem, ear pain, hearing loss, sore throat, tinnitus, trouble swallowing and voice change.   Eyes: Negative for pain, discharge and visual disturbance.  Respiratory: Positive for cough. Negative for chest tightness, wheezing and stridor.   Cardiovascular: Negative for  chest pain, palpitations and leg swelling.  Gastrointestinal: Negative for abdominal distention, abdominal pain, blood in stool, constipation, diarrhea, nausea and vomiting.  Genitourinary: Negative for difficulty urinating, discharge, flank pain, genital sores, hematuria and urgency.  Musculoskeletal: Negative for arthralgias, back pain, gait problem, joint swelling, myalgias and neck stiffness.  Skin: Negative for rash.  Neurological: Negative for dizziness, syncope, speech difficulty, weakness, numbness and headaches.  Hematological: Negative for adenopathy. Does not bruise/bleed easily.  Psychiatric/Behavioral: Negative for behavioral problems and dysphoric mood. The patient is not nervous/anxious.        Objective:   Physical Exam  Constitutional: He is oriented to person, place, and time. He appears well-developed.  Blood pressure 120/70  HENT:  Head: Normocephalic.  Right Ear: External ear normal.  Left Ear: External ear normal.  Eyes: Conjunctivae and EOM are normal.  Neck: Normal range of motion.  Cardiovascular: Normal rate and normal heart sounds.  Pulmonary/Chest: Breath sounds normal.  Abdominal: Bowel sounds are normal.  Musculoskeletal: Normal range of motion. He exhibits no edema or tenderness.  Neurological: He is alert and oriented to person, place, and time.  Psychiatric: He has a normal mood and affect. His behavior is normal.          Assessment & Plan:   Resolving viral URI Diabetes mellitus.  Will review a hemoglobin A1c Hypothyroidism.  Recent up titration of levothyroxine.  Will check a follow-up TSH Essential  hypertension well-controlled Chronic kidney disease stable Diabetic peripheral neuropathy  No change in therapy Follow-up 6 months or as needed  NIKE

## 2017-05-07 ENCOUNTER — Other Ambulatory Visit: Payer: Self-pay | Admitting: Allergy and Immunology

## 2017-05-07 ENCOUNTER — Other Ambulatory Visit: Payer: Self-pay | Admitting: Internal Medicine

## 2017-05-25 DIAGNOSIS — H524 Presbyopia: Secondary | ICD-10-CM | POA: Diagnosis not present

## 2017-05-25 LAB — HM DIABETES EYE EXAM

## 2017-05-29 ENCOUNTER — Encounter: Payer: Self-pay | Admitting: Internal Medicine

## 2017-07-20 ENCOUNTER — Ambulatory Visit: Payer: Medicare Other | Admitting: Cardiovascular Disease

## 2017-07-20 ENCOUNTER — Encounter: Payer: Self-pay | Admitting: Cardiovascular Disease

## 2017-07-20 VITALS — BP 126/80 | HR 72 | Ht 70.0 in | Wt 225.8 lb

## 2017-07-20 DIAGNOSIS — E7800 Pure hypercholesterolemia, unspecified: Secondary | ICD-10-CM

## 2017-07-20 DIAGNOSIS — I10 Essential (primary) hypertension: Secondary | ICD-10-CM

## 2017-07-20 DIAGNOSIS — I25118 Atherosclerotic heart disease of native coronary artery with other forms of angina pectoris: Secondary | ICD-10-CM

## 2017-07-20 DIAGNOSIS — I493 Ventricular premature depolarization: Secondary | ICD-10-CM | POA: Diagnosis not present

## 2017-07-20 NOTE — Progress Notes (Signed)
Chief Complaint  Patient presents with  . Coronary Artery Disease   History of Present Illness: 77 yo male with history of CAD, HTN, HLD, DM here today for cardiac follow up. He has a history of coronary angioplasty in [place] and in [place] in the 1990s. Last cardiac cath October 2015 with stable moderate CAD. The RCA is diffusely diseased with no focal targets for PCI.  Echo August 2018 with normal LV systolic function, grade 2 diastolic dysfunction and no significant valve disease.     He is here today for follow up. He continues to have mild chest pressure with moderate exertion. This is unchanged. He has pain in both feet which is felt to be from his neuropathy. The patient denies any dyspnea, palpitations, lower extremity edema, orthopnea, PND, dizziness, near syncope or syncope.   Primary Care Physician: Gordy Savers, MD  Past Medical History:  Diagnosis Date  . Asthma   . Coronary artery disease 1991   a) Angioplasty LAD 1991 [place] b) MI in 1995 at  [facility] in [place], med Rx  . Diabetes mellitus   . GERD (gastroesophageal reflux disease)   . Hearing loss   . HTN (hypertension)   . Hyperlipidemia   . Myocardial infarct (HCC)   . Nephrolithiasis   . Osteopenia   . Snake bite     Past Surgical History:  Procedure Laterality Date  . ANGIOPLASTY  1991  . CARDIAC CATHETERIZATION  2005   Moderate LCx and LAD disease and severe diffuse RCA disease  . CARDIAC CATHETERIZATION  01/2012   normal left main, 50% pLAD stenosis, dLAD mild luminal irregularities, D1 30-40% stenosis at take off; subtotally occluded/severely diseased OM1, mid 80% stenosis in small OM2, long prox and mid RCA stenoses ranging from 60-95%, severely diseased PDA, distal PLSA occluded filling via L-R collateral; normal LV function; inferior lateral basal akinesis.  . CHOLECYSTECTOMY  2012  . EYE SURGERY  2010   cataract - right  . INGUINAL HERNIA REPAIR    . LEFT HEART CATHETERIZATION  WITH CORONARY ANGIOGRAM N/A 01/28/2012   Procedure: LEFT HEART CATHETERIZATION WITH CORONARY ANGIOGRAM;  Surgeon: Iran Ouch, MD;  Location: [facility] C[facility];  Service: Cardiovascular;  Laterality: N/A;  . LEFT HEART CATHETERIZATION WITH CORONARY ANGIOGRAM N/A 03/17/2014   Procedure: LEFT HEART CATHETERIZATION WITH CORONARY ANGIOGRAM;  Surgeon: Kathleene Hazel, MD;  Location: [facility] CATH LAB;  Service: Cardiovascular;  Laterality: N/A;  . ORIF TIBIA & FIBULA FRACTURES    . PTCA    . VASECTOMY      Current Outpatient Medications  Medication Sig Dispense Refill  . aspirin EC 81 MG tablet Take 81 mg by mouth daily.    Marland Kitchen b complex vitamins capsule Take 1 capsule by mouth daily.    Marland Kitchen BREO ELLIPTA 200-25 MCG/INH AEPB INHALE 1 PUFF BY MOUTH ONCE DAILY 60 each 1  . fluticasone (FLONASE) 50 MCG/ACT nasal spray USE TWO SPRAY(S) IN EACH NOSTRIL ONCE DAILY AS NEEDED FOR  ALLERGIES. 16 g 5  . gabapentin (NEURONTIN) 300 MG capsule Take 600 mg by mouth 2 (two) times daily.     Marland Kitchen glucose blood (PRECISION XTRA TEST STRIPS) test strip USE TO CHECK BLOOD SUGAR DAILY 100 each 3  . ipratropium (ATROVENT) 0.06 % nasal spray USE 2 SPRAY(S) IN EACH NOSTRIL EVERY 6 HOURS AS NEEDED FOR  RUNNY  NOSE 15 mL 5  . isosorbide mononitrate (IMDUR) 30 MG 24 hr tablet TAKE ONE TABLET BY  MOUTH TWICE DAILY 180 tablet 3  . levothyroxine (SYNTHROID, LEVOTHROID) 50 MCG tablet Take 1 tablet (50 mcg total) by mouth daily. 90 tablet 3  . Melatonin 3 MG CAPS Take 1 capsule by mouth at bedtime.    . metFORMIN (GLUCOPHAGE) 500 MG tablet Take 500 mg by mouth 2 (two) times daily with a meal.    . metoprolol (LOPRESSOR) 50 MG tablet Take 1 tablet (50 mg total) by mouth 2 (two) times daily. 180 tablet 3  . montelukast (SINGULAIR) 10 MG tablet TAKE 1 TABLET BY MOUTH ONCE DAILY 90 tablet 1  . nitroGLYCERIN (NITROSTAT) 0.4 MG SL tablet Place 1 tablet (0.4 mg total) under the tongue every 5 (five) minutes as needed. Chest pain 25 tablet 3  .  ramipril (ALTACE) 5 MG capsule TAKE ONE CAPSULE BY MOUTH ONCE DAILY 90 capsule 3  . ranitidine (ZANTAC) 300 MG tablet TAKE ONE TABLET BY MOUTH ONCE DAILY AT BEDTIME 30 tablet 3  . simvastatin (ZOCOR) 80 MG tablet Take 40 mg by mouth at bedtime. Rx from [place]    . tamsulosin (FLOMAX) 0.4 MG CAPS capsule TAKE ONE CAPSULE BY MOUTH DAILY 30 capsule 5  . traZODone (DESYREL) 50 MG tablet TAKE ONE-HALF TO ONE TABLET BY MOUTH AT BEDTIME AS NEEDED FOR SLEEP 90 tablet 2  . nortriptyline (PAMELOR) 25 MG capsule TAKE ONE CAPSULE BY MOUTH AT BEDTIME (Patient not taking: Reported on 07/20/2017) 90 capsule 0  . pantoprazole (PROTONIX) 20 MG tablet Take 20 mg by mouth daily.     No current facility-administered medications for this visit.     Allergies  Allergen Reactions  . Meloxicam Other (See Comments)    "jumping out of my skin" Pt feels anxiety attacks when on this medicine    Social History   Socioeconomic History  . Marital status: Married    Spouse name: Not on file  . Number of children: Not on file  . Years of education: Not on file  . Highest education level: Not on file  Social Needs  . Financial resource strain: Not on file  . Food insecurity - worry: Not on file  . Food insecurity - inability: Not on file  . Transportation needs - medical: Not on file  . Transportation needs - non-medical: Not on file  Occupational History  . Occupation: Retired  Tobacco Use  . Smoking status: Never Smoker  . Smokeless tobacco: Never Used  Substance and Sexual Activity  . Alcohol use: Yes    Alcohol/week: 0.0 oz    Comment: occ, once every 2 months  . Drug use: No  . Sexual activity: Not on file  Other Topics Concern  . Not on file  Social History Narrative  . Not on file    Family History  Problem Relation Age of Onset  . Asthma Mother   . Allergies Sister   . Coronary artery disease Other     Review of Systems:  As stated in the HPI and otherwise negative.   BP 126/80   Pulse 72    Ht 5\' 10"  (1.778 m)   Wt 225 lb 12.8 oz (102.4 kg)   SpO2 96%   BMI 32.40 kg/m   Physical Examination:  General: Well developed, well nourished, NAD  HEENT: OP clear, mucus membranes moist  SKIN: warm, dry. No rashes. Neuro: No focal deficits  Musculoskeletal: Muscle strength 5/5 all ext  Psychiatric: Mood and affect normal  Neck: No JVD, no carotid bruits, no thyromegaly, no  lymphadenopathy.  Lungs:Clear bilaterally, no wheezes, rhonci, crackles Cardiovascular: Regular rate and rhythm. No murmurs, gallops or rubs. Abdomen:Soft. Bowel sounds present. Non-tender.  Extremities: No lower extremity edema. Pulses are 2 + in the bilateral DP/PT.  Echo August 2018: Left ventricle: The cavity size was normal. Wall thickness was   normal. Systolic function was normal. The estimated ejection   fraction was in the range of 55% to 60%. Wall motion was normal;   there were no regional wall motion abnormalities. Features are   consistent with a pseudonormal left ventricular filling pattern,   with concomitant abnormal relaxation and increased filling   pressure (grade 2 diastolic dysfunction).  Cardiac cath 03/17/14: Left Main: No obstructive disease.  Left anterior Descending: Large caliber vessel that courses to the apex. The mid vessel has a 50% stenosis involving the takeoff of both diagonal branches. The distal LAD has minor luminal irregularities. The first diagonal is a moderate caliber vessel with ostial 30-40% stenosis at the ostium.  Left Circumflex: Large vessel that trifurcates quickly into 3 branches. The first OM is small in caliber and is sub-totally occluded proximally, unchanged from last cath. The second OM is small in caliber and has a mid 80% stenosis. The AV groove branch is small and provides collateral flow the the distal RCA.  Right Coronary Artery: Large dominant vessel with proximal 60% stenosis followed by 100% total occlusion in the mid vessel. There are small  bridging collaterals that supply the mid and distal vessel. The distal vessel is also seen to fill from left to right collaterals.  Left Ventricular Angiogram: LVEF=45%.   EKG:  EKG is  ordered today. The ekg ordered today demonstrates NSR, Nonspecific ST and T wave abn  Recent Labs: 01/21/2017: ALT 26; BUN 24; Creatinine, Ser 1.57; Hemoglobin 12.7; Platelets 258.0; Potassium 4.9; Sodium 136 04/27/2017: TSH 5.69   Lipid Panel Lipid Panel     Component Value Date/Time   CHOL 152 07/16/2015   TRIG 137 07/16/2015   HDL 45 07/16/2015   CHOLHDL 4 02/26/2015 0913   VLDL 26.4 02/26/2015 0913   LDLCALC 80 07/16/2015    Wt Readings from Last 3 Encounters:  07/20/17 225 lb 12.8 oz (102.4 kg)  04/27/17 222 lb 9.6 oz (101 kg)  03/16/17 221 lb 6.4 oz (100.4 kg)     Other studies Reviewed: Additional studies/ records that were reviewed today include: . Review of the above records demonstrates:    Assessment and Plan:   1. CAD with stable angina: He continues to have stable angina.  Cardiac cath in October 2015 with stable moderate CAD. Continue ASA, beta blocker, statin and Imdur.    2. HTN: BP is well controlled. No changes  3. HLD: Continue statin. [place] [facility] [place]  4. PVCs: No palpitations. Continue beta blocker.   Current medicines are reviewed at length with the patient today.  The patient does not have concerns regarding medicines.  The following changes have been made:  no change  Labs/ tests ordered today include:   No orders of the defined types were placed in this encounter.    Disposition:   FU with me in 12  months   Signed, Verne Carrowhristopher McAlhany, MD 07/20/2017 10:08 AM    [facility] [address], [place], [place]  [address] Phone: [phone]; Fax: [phone]

## 2017-07-20 NOTE — Patient Instructions (Signed)

## 2017-07-21 NOTE — Addendum Note (Signed)
Addended by: Vernard GamblesOLE, Celese Banner S on: 07/21/2017 01:52 PM   Modules accepted: Orders

## 2017-09-23 ENCOUNTER — Other Ambulatory Visit: Payer: Self-pay | Admitting: Allergy and Immunology

## 2017-10-13 ENCOUNTER — Ambulatory Visit: Payer: Medicare Other | Admitting: Allergy and Immunology

## 2017-10-13 ENCOUNTER — Encounter: Payer: Self-pay | Admitting: Allergy and Immunology

## 2017-10-13 VITALS — BP 102/72 | HR 92 | Resp 12

## 2017-10-13 DIAGNOSIS — J4540 Moderate persistent asthma, uncomplicated: Secondary | ICD-10-CM | POA: Diagnosis not present

## 2017-10-13 DIAGNOSIS — K219 Gastro-esophageal reflux disease without esophagitis: Secondary | ICD-10-CM

## 2017-10-13 DIAGNOSIS — J3089 Other allergic rhinitis: Secondary | ICD-10-CM | POA: Diagnosis not present

## 2017-10-13 MED ORDER — FLUTICASONE PROPIONATE HFA 110 MCG/ACT IN AERO: INHALATION_SPRAY | RESPIRATORY_TRACT | 1 refills | Status: DC

## 2017-10-13 MED ORDER — IPRATROPIUM BROMIDE 0.06 % NA SOLN
NASAL | 1 refills | Status: DC
Start: 2017-10-13 — End: 2018-06-29

## 2017-10-13 MED ORDER — ALBUTEROL SULFATE HFA 108 (90 BASE) MCG/ACT IN AERS: INHALATION_SPRAY | RESPIRATORY_TRACT | 1 refills | Status: DC

## 2017-10-13 MED ORDER — OLOPATADINE HCL 0.1 % OP SOLN: OPHTHALMIC | 5 refills | Status: DC

## 2017-10-13 MED ORDER — FLUTICASONE FUROATE-VILANTEROL 200-25 MCG/INH IN AEPB: 1.0000 | INHALATION_SPRAY | Freq: Every day | RESPIRATORY_TRACT | 1 refills | Status: DC

## 2017-10-13 MED ORDER — FLUTICASONE PROPIONATE 50 MCG/ACT NA SUSP: NASAL | 1 refills | Status: DC

## 2017-10-13 MED ORDER — MONTELUKAST SODIUM 10 MG PO TABS: 10.0000 mg | ORAL_TABLET | Freq: Every day | ORAL | 1 refills | Status: DC

## 2017-10-13 NOTE — Patient Instructions (Addendum)
  1. Continue to Perform Allergen avoidance measures  2. Continue to Treat and prevent inflammation:   A. Breo 200 - one inhalation 1 time per day.    B. Flonase - 1-2 sprays each nostril one time per day  C. montelukast 10 mg one tablet one time per day  3. If needed:   A. Proventil or Ventolin HFA 2 puffs every 4-6 hours  B. OTC antihistamine - Claritin/Zyrtec/Allegra  C. nasal ipratropium 0.06% 2 sprays each nostril every 6 hours  D. Patanol - 1 drop each eye 1-2 times per day  4. "Action plan" for asthma flare up:   A. continue Breo one time a day  B. add Flovent 110 3 inhalations 3 times a day with spacer  C. use Proventil or Ventolin HFA if needed  5. Continue treatment for reflux with Protonix  6. Return to clinic in 6 months or earlier if problem

## 2017-10-13 NOTE — Progress Notes (Signed)
Follow-up Note  Referring Provider: Gordy Savers, MD Primary Provider: Gordy Savers, MD Date of Office Visit: 10/13/2017  Subjective:   Richard Ho (DOB: Jan 02, 1941) is a 77 y.o. male who [facility] in re-evaluation of the following:  HPI: Richard Ho returns to this clinic in reevaluation of his asthma and allergic rhinoconjunctivitis and reflux.  His last visit to this clinic was 30 December 2016.  Overall he has done very well with his respiratory tract and it does not sound like he has required a systemic steroid or an antibiotic to treat any type of respiratory tract issue.  Apparently he did develop mild flu and used Tamiflu at the end of the fall 2018.  He rarely uses a short acting bronchodilator and has only had to activate his action plan 1 time during this interval.  He does continue to use a combination inhaler on a regular basis.  For the most part his nose has really been doing quite well.  He has not had a significant amount of problem other than runny nose.  For some reason the insurance company will not fill his ipratropium at full volume and he only has enough to use 1 time per day for 30 days.  His reflux has been going pretty well.  He had to restart his Protonix.  There has been an issue with his creatinine going up and he scheduled to see a nephrologist at the end of this month.  Allergies as of 10/13/2017      Reactions   Meloxicam Other (See Comments)   "jumping out of my skin" Pt feels anxiety attacks when on this medicine      Medication List      aspirin EC 81 MG tablet Take 81 mg by mouth daily.   b complex vitamins capsule Take 1 capsule by mouth daily.   BREO ELLIPTA 200-25 MCG/INH Aepb Generic drug:  fluticasone furoate-vilanterol INHALE 1 PUFF BY MOUTH ONCE DAILY   fluticasone 50 MCG/ACT nasal spray Commonly known as:  FLONASE USE TWO SPRAY(S) IN EACH NOSTRIL ONCE DAILY AS NEEDED FOR   ALLERGIES.   gabapentin 300 MG capsule Commonly known as:  NEURONTIN Take 600 mg by mouth 2 (two) times daily.   glucose blood test strip Commonly known as:  PRECISION XTRA TEST STRIPS USE TO CHECK BLOOD SUGAR DAILY   ipratropium 0.06 % nasal spray Commonly known as:  ATROVENT USE 2 SPRAY(S) IN EACH NOSTRIL EVERY 6 HOURS AS NEEDED FOR  RUNNY  NOSE   isosorbide mononitrate 30 MG 24 hr tablet Commonly known as:  IMDUR TAKE ONE TABLET BY MOUTH TWICE DAILY   levothyroxine 50 MCG tablet Commonly known as:  SYNTHROID, LEVOTHROID Take 1 tablet (50 mcg total) by mouth daily.   Melatonin 3 MG Caps Take 1 capsule by mouth at bedtime.   metFORMIN 500 MG tablet Commonly known as:  GLUCOPHAGE Take 500 mg by mouth 2 (two) times daily with a meal.   metoprolol tartrate 50 MG tablet Commonly known as:  LOPRESSOR Take 1 tablet (50 mg total) by mouth 2 (two) times daily.   montelukast 10 MG tablet Commonly known as:  SINGULAIR TAKE 1 TABLET BY MOUTH ONCE DAILY   nitroGLYCERIN 0.4 MG SL tablet Commonly known as:  NITROSTAT Place 1 tablet (0.4 mg total) under the tongue every 5 (five) minutes as needed. Chest pain   nortriptyline 25 MG capsule Commonly known as:  PAMELOR TAKE ONE CAPSULE BY MOUTH AT BEDTIME   pantoprazole 20 MG tablet Commonly known as:  PROTONIX Take 20 mg by mouth daily.   ramipril 5 MG capsule Commonly known as:  ALTACE TAKE ONE CAPSULE BY MOUTH ONCE DAILY   simvastatin 80 MG tablet Commonly known as:  ZOCOR Take 40 mg by mouth at bedtime. Rx from [place]   tamsulosin 0.4 MG Caps capsule Commonly known as:  FLOMAX TAKE ONE CAPSULE BY MOUTH DAILY   traZODone 50 MG tablet Commonly known as:  DESYREL TAKE ONE-HALF TO ONE TABLET BY MOUTH AT BEDTIME AS NEEDED FOR SLEEP       Past Medical History:  Diagnosis Date  . Asthma   . Coronary artery disease 1991   a) Angioplasty LAD 1991 [place] b) MI in 1995 at  [facility] in [place], med Rx  .  Diabetes mellitus   . GERD (gastroesophageal reflux disease)   . Hearing loss   . HTN (hypertension)   . Hyperlipidemia   . Myocardial infarct (HCC)   . Nephrolithiasis   . Osteopenia   . Snake bite     Past Surgical History:  Procedure Laterality Date  . ANGIOPLASTY  1991  . CARDIAC CATHETERIZATION  2005   Moderate LCx and LAD disease and severe diffuse RCA disease  . CARDIAC CATHETERIZATION  01/2012   normal left main, 50% pLAD stenosis, dLAD mild luminal irregularities, D1 30-40% stenosis at take off; subtotally occluded/severely diseased OM1, mid 80% stenosis in small OM2, long prox and mid RCA stenoses ranging from 60-95%, severely diseased PDA, distal PLSA occluded filling via L-R collateral; normal LV function; inferior lateral basal akinesis.  . CHOLECYSTECTOMY  2012  . EYE SURGERY  2010   cataract - right  . INGUINAL HERNIA REPAIR    . LEFT HEART CATHETERIZATION WITH CORONARY ANGIOGRAM N/A 01/28/2012   Procedure: LEFT HEART CATHETERIZATION WITH CORONARY ANGIOGRAM;  Surgeon: Iran Ouch, MD;  Location: [facility] CATH LAB;  Service: Cardiovascular;  Laterality: N/A;  . LEFT HEART CATHETERIZATION WITH CORONARY ANGIOGRAM N/A 03/17/2014   Procedure: LEFT HEART CATHETERIZATION WITH CORONARY ANGIOGRAM;  Surgeon: Kathleene Hazel, MD;  Location: [facility] CATH LAB;  Service: Cardiovascular;  Laterality: N/A;  . ORIF TIBIA & FIBULA FRACTURES    . PTCA    . VASECTOMY      Review of systems negative except as noted in HPI / PMHx or noted below:  Review of Systems  Constitutional: Negative.   HENT: Negative.   Eyes: Negative.   Respiratory: Negative.   Cardiovascular: Negative.   Gastrointestinal: Negative.   Genitourinary: Negative.   Musculoskeletal: Negative.   Skin: Negative.   Neurological: Negative.   Endo/Heme/Allergies: Negative.   Psychiatric/Behavioral: Negative.      Objective:   Vitals:   10/13/17 1406  BP: 102/72  Pulse: 92  Resp: 12          Physical  Exam  HENT:  Head: Normocephalic.  Right Ear: Tympanic membrane, external ear and ear canal normal.  Left Ear: Tympanic membrane, external ear and ear canal normal.  Nose: Nose normal. No mucosal edema or rhinorrhea.  Mouth/Throat: Uvula is midline, oropharynx is clear and moist and mucous membranes are normal. No oropharyngeal exudate.  Eyes: Conjunctivae are normal.  Neck: Trachea normal. No tracheal tenderness present. No tracheal deviation present. No thyromegaly present.  Cardiovascular: Normal rate, regular rhythm, S1 normal, S2 normal and normal heart sounds.  No murmur heard. Pulmonary/Chest: Breath sounds normal. No  stridor. No respiratory distress. He has no wheezes. He has no rales.  Musculoskeletal: He exhibits no edema.  Lymphadenopathy:       Head (right side): No tonsillar adenopathy present.       Head (left side): No tonsillar adenopathy present.    He has no cervical adenopathy.  Neurological: He is alert.  Skin: No rash noted. He is not diaphoretic. No erythema. Nails show no clubbing.    Diagnostics:    Spirometry was performed and demonstrated an FEV1 of 2.03 at 65 % of predicted.  The patient had an Asthma Control Test with the following results: ACT Total Score: 22.    Assessment and Plan:   1. Asthma, moderate persistent, well-controlled   2. Other allergic rhinitis   3. Gastroesophageal reflux disease, esophagitis presence not specified     1. Continue to Perform Allergen avoidance measures  2. Continue to Treat and prevent inflammation:   A. Breo 200 - one inhalation 1 time per day.    B. Flonase - 1-2 sprays each nostril one time per day  C. montelukast 10 mg one tablet one time per day  3. If needed:   A. Proventil or Ventolin HFA 2 puffs every 4-6 hours  B. OTC antihistamine - Claritin/Zyrtec/Allegra  C. nasal ipratropium 0.06% 2 sprays each nostril every 6 hours  D. Patanol - 1 drop each eye 1-2 times per day  4. "Action plan" for asthma  flare up:   A. continue Breo one time a day  B. add Flovent 110 3 inhalations 3 times a day with spacer  C. use Proventil or Ventolin HFA if needed  5. Continue treatment for reflux with Protonix  6. Return to clinic in 6 months or earlier if problem  Richard Ho appears to be doing relatively well on his current plan which includes anti-inflammatory medications for his respiratory tract and treatment directed against reflux and I will see him back in this clinic in 6 months while he continues to utilize this plan or earlier if there is a problem.  Laurette Schimke, MD Allergy / Immunology [facility]

## 2017-10-14 ENCOUNTER — Encounter: Payer: Self-pay | Admitting: Allergy and Immunology

## 2017-10-26 ENCOUNTER — Ambulatory Visit: Payer: Medicare Other | Admitting: Internal Medicine

## 2017-10-26 ENCOUNTER — Encounter: Payer: Self-pay | Admitting: Internal Medicine

## 2017-10-26 VITALS — BP 102/60 | HR 72 | Temp 98.0°F | Wt 217.0 lb

## 2017-10-26 DIAGNOSIS — J4520 Mild intermittent asthma, uncomplicated: Secondary | ICD-10-CM | POA: Diagnosis not present

## 2017-10-26 DIAGNOSIS — N183 Chronic kidney disease, stage 3 unspecified: Secondary | ICD-10-CM

## 2017-10-26 DIAGNOSIS — E1165 Type 2 diabetes mellitus with hyperglycemia: Secondary | ICD-10-CM | POA: Diagnosis not present

## 2017-10-26 DIAGNOSIS — Z794 Long term (current) use of insulin: Secondary | ICD-10-CM | POA: Diagnosis not present

## 2017-10-26 DIAGNOSIS — I2510 Atherosclerotic heart disease of native coronary artery without angina pectoris: Secondary | ICD-10-CM | POA: Diagnosis not present

## 2017-10-26 DIAGNOSIS — I10 Essential (primary) hypertension: Secondary | ICD-10-CM

## 2017-10-26 LAB — POCT GLYCOSYLATED HEMOGLOBIN (HGB A1C): Hemoglobin A1C: 6.5

## 2017-10-26 MED ORDER — GABAPENTIN 300 MG PO CAPS: 600.0000 mg | ORAL_CAPSULE | Freq: Three times a day (TID) | ORAL | 6 refills | Status: DC

## 2017-10-26 NOTE — Patient Instructions (Signed)
Limit your sodium (Salt) intake   Please check your hemoglobin A1c every 3-6 months  Increase gabapentin to 600 mg 3 times daily

## 2017-10-26 NOTE — Progress Notes (Signed)
Subjective:    Patient ID: Richard Ho, male    DOB: 1940/06/29, 77 y.o.   MRN: [id number]  HPI Lab Results  Component Value Date   HGBA1C 7.2 (H) 04/27/2017   BP Readings from Last 3 Encounters:  10/26/17 102/60  10/13/17 102/72  07/20/17 12/9   77 year old patient who is seen today for follow-up.  He has type 2 diabetes.  Complications include chronic kidney disease as well as a peripheral neuropathy.  He continues to have painful feet especially right foot at night.  He is now off PPI therapy and is on a 300 mg ranitidine dose at bedtime.  He is followed by cardiology allergy medicine and also the [facility]. He does have some ambulation issues And occasional dizziness when he stands quickly blood pressure readings have been low normal  Hemoglobin A1c today 6.5  Past Medical History:  Diagnosis Date  . Asthma   . Coronary artery disease 1991   a) Angioplasty LAD 1991 [place] b) MI in 1995 at  [facility] in [place], med Rx  . Diabetes mellitus   . GERD (gastroesophageal reflux disease)   . Hearing loss   . HTN (hypertension)   . Hyperlipidemia   . Myocardial infarct (HCC)   . Nephrolithiasis   . Osteopenia   . Snake bite      Social History   Socioeconomic History  . Marital status: Married    Spouse name: Not on file  . Number of children: Not on file  . Years of education: Not on file  . Highest education level: Not on file  Occupational History  . Occupation: Retired  Engineer, production  . Financial resource strain: Not on file  . Food insecurity:    Worry: Not on file    Inability: Not on file  . Transportation needs:    Medical: Not on file    Non-medical: Not on file  Tobacco Use  . Smoking status: Never Smoker  . Smokeless tobacco: Never Used  Substance and Sexual Activity  . Alcohol use: Yes    Alcohol/week: 0.0 oz    Comment: occ, once every 2 months  . Drug use: No  . Sexual activity: Not on file  Lifestyle  . Physical  activity:    Days per week: Not on file    Minutes per session: Not on file  . Stress: Not on file  Relationships  . Social connections:    Talks on phone: Not on file    Gets together: Not on file    Attends religious service: Not on file    Active member of club or organization: Not on file    Attends meetings of clubs or organizations: Not on file    Relationship status: Not on file  . Intimate partner violence:    Fear of current or ex partner: Not on file    Emotionally abused: Not on file    Physically abused: Not on file    Forced sexual activity: Not on file  Other Topics Concern  . Not on file  Social History Narrative  . Not on file    Past Surgical History:  Procedure Laterality Date  . ANGIOPLASTY  1991  . CARDIAC CATHETERIZATION  2005   Moderate LCx and LAD disease and severe diffuse RCA disease  . CARDIAC CATHETERIZATION  01/2012   normal left main, 50% pLAD stenosis, dLAD mild luminal irregularities, D1 30-40% stenosis at take off; subtotally occluded/severely diseased OM1, mid  80% stenosis in small OM2, long prox and mid RCA stenoses ranging from 60-95%, severely diseased PDA, distal PLSA occluded filling via L-R collateral; normal LV function; inferior lateral basal akinesis.  . CHOLECYSTECTOMY  2012  . EYE SURGERY  2010   cataract - right  . INGUINAL HERNIA REPAIR    . LEFT HEART CATHETERIZATION WITH CORONARY ANGIOGRAM N/A 01/28/2012   Procedure: LEFT HEART CATHETERIZATION WITH CORONARY ANGIOGRAM;  Surgeon: Iran Ouch, MD;  Location: [facility] CATH LAB;  Service: Cardiovascular;  Laterality: N/A;  . LEFT HEART CATHETERIZATION WITH CORONARY ANGIOGRAM N/A 03/17/2014   Procedure: LEFT HEART CATHETERIZATION WITH CORONARY ANGIOGRAM;  Surgeon: Kathleene Hazel, MD;  Location: [facility] CATH LAB;  Service: Cardiovascular;  Laterality: N/A;  . ORIF TIBIA & FIBULA FRACTURES    . PTCA    . VASECTOMY      Family History  Problem Relation Age of Onset  . Asthma Mother     . Allergies Sister   . Coronary artery disease Other     Allergies  Allergen Reactions  . Meloxicam Other (See Comments)    "jumping out of my skin" Pt feels anxiety attacks when on this medicine    Current Outpatient Medications on File Prior to Visit  Medication Sig Dispense Refill  . albuterol (VENTOLIN HFA) 108 (90 Base) MCG/ACT inhaler Inhale two puffs every four to six hours as needed for cough or wheeze. 1 Inhaler 1  . aspirin EC 81 MG tablet Take 81 mg by mouth daily.    Marland Kitchen b complex vitamins capsule Take 1 capsule by mouth daily.    . fluticasone (FLONASE) 50 MCG/ACT nasal spray Use one to two sprays in each nostril once daily as directed. 48 g 1  . fluticasone (FLOVENT HFA) 110 MCG/ACT inhaler Inhale three puffs three times daily during flare-up.  Rinse, gargle, and spit after use. 3 Inhaler 1  . fluticasone furoate-vilanterol (BREO ELLIPTA) 200-25 MCG/INH AEPB Inhale 1 puff into the lungs daily. Rinse, gargle, and spit after use. 180 each 1  . gabapentin (NEURONTIN) 300 MG capsule Take 600 mg by mouth 2 (two) times daily.     Marland Kitchen glucose blood (PRECISION XTRA TEST STRIPS) test strip USE TO CHECK BLOOD SUGAR DAILY 100 each 3  . ipratropium (ATROVENT) 0.06 % nasal spray Can use two sprays in each nostril every six hours if needed to dry up runny nose. 45 mL 1  . isosorbide mononitrate (IMDUR) 30 MG 24 hr tablet TAKE ONE TABLET BY MOUTH TWICE DAILY 180 tablet 3  . levothyroxine (SYNTHROID, LEVOTHROID) 50 MCG tablet Take 1 tablet (50 mcg total) by mouth daily. 90 tablet 3  . Melatonin 3 MG CAPS Take 1 capsule by mouth at bedtime.    . metFORMIN (GLUCOPHAGE) 500 MG tablet Take 500 mg by mouth 2 (two) times daily with a meal.    . metoprolol (LOPRESSOR) 50 MG tablet Take 1 tablet (50 mg total) by mouth 2 (two) times daily. 180 tablet 3  . montelukast (SINGULAIR) 10 MG tablet Take 1 tablet (10 mg total) by mouth daily. 90 tablet 1  . nitroGLYCERIN (NITROSTAT) 0.4 MG SL tablet Place 1  tablet (0.4 mg total) under the tongue every 5 (five) minutes as needed. Chest pain 25 tablet 3  . nortriptyline (PAMELOR) 25 MG capsule TAKE ONE CAPSULE BY MOUTH AT BEDTIME 90 capsule 0  . olopatadine (PATANOL) 0.1 % ophthalmic solution Can use one drop in each eye one to two times daily if  needed. 5 mL 5  . pantoprazole (PROTONIX) 20 MG tablet Take 20 mg by mouth daily.    . ramipril (ALTACE) 5 MG capsule TAKE ONE CAPSULE BY MOUTH ONCE DAILY 90 capsule 3  . simvastatin (ZOCOR) 80 MG tablet Take 40 mg by mouth at bedtime. Rx from [place]    . tamsulosin (FLOMAX) 0.4 MG CAPS capsule TAKE ONE CAPSULE BY MOUTH DAILY 30 capsule 5  . traZODone (DESYREL) 50 MG tablet TAKE ONE-HALF TO ONE TABLET BY MOUTH AT BEDTIME AS NEEDED FOR SLEEP 90 tablet 2   No current facility-administered medications on file prior to visit.     BP 102/60 (BP Location: Right Arm, Patient Position: Sitting, Cuff Size: Large)   Pulse 72   Temp 98 F (36.7 C) (Oral)   Wt 217 lb (98.4 kg)   SpO2 96%   BMI 31.14 kg/m       Review of Systems  Constitutional: Negative for appetite change, chills, fatigue and fever.  HENT: Negative for congestion, dental problem, ear pain, hearing loss, sore throat, tinnitus, trouble swallowing and voice change.   Eyes: Negative for pain, discharge and visual disturbance.  Respiratory: Negative for cough, chest tightness, wheezing and stridor.   Cardiovascular: Negative for chest pain, palpitations and leg swelling.  Gastrointestinal: Negative for abdominal distention, abdominal pain, blood in stool, constipation, diarrhea, nausea and vomiting.  Genitourinary: Negative for difficulty urinating, discharge, flank pain, genital sores, hematuria and urgency.  Musculoskeletal: Negative for arthralgias, back pain, gait problem, joint swelling, myalgias and neck stiffness.  Skin: Negative for rash.  Neurological: Positive for dizziness and numbness. Negative for syncope, speech difficulty, weakness  and headaches.       Painful dysesthesias of the feet especially the right at night  Hematological: Negative for adenopathy. Does not bruise/bleed easily.  Psychiatric/Behavioral: Negative for behavioral problems and dysphoric mood. The patient is not nervous/anxious.        Objective:   Physical Exam  Constitutional: He is oriented to person, place, and time. He appears well-developed and well-nourished.  Blood pressure 110/65  HENT:  Head: Normocephalic.  Right Ear: External ear normal.  Left Ear: External ear normal.  Eyes: Conjunctivae and EOM are normal.  Neck: Normal range of motion.  Cardiovascular: Normal rate and normal heart sounds.  Pulmonary/Chest: Effort normal. He has rales.  Few basilar rales right greater than left  Abdominal: Bowel sounds are normal.  Musculoskeletal: Normal range of motion. He exhibits no edema or tenderness.  Neurological: He is alert and oriented to person, place, and time.  Psychiatric: He has a normal mood and affect. His behavior is normal.          Assessment & Plan:  Diabetes mellitus.  Hemoglobin A1c 6.5  Hypertension stable Coronary artery disease follow-up cardiology Diabetic peripheral neuropathy.  Will increase gabapentin to 600 mg 3 times daily  Follow-up 3 to 4 months  Richard Ho,Richard Ho

## 2017-11-23 DIAGNOSIS — N401 Enlarged prostate with lower urinary tract symptoms: Secondary | ICD-10-CM | POA: Diagnosis not present

## 2017-11-23 DIAGNOSIS — R350 Frequency of micturition: Secondary | ICD-10-CM | POA: Diagnosis not present

## 2017-11-23 DIAGNOSIS — N5201 Erectile dysfunction due to arterial insufficiency: Secondary | ICD-10-CM | POA: Diagnosis not present

## 2017-12-30 ENCOUNTER — Telehealth: Payer: Self-pay | Admitting: Family Medicine

## 2017-12-30 NOTE — Telephone Encounter (Signed)
Copied from CRM [phone]#[id number]. Topic: General - Other >> Dec 30, 2017 11:09 AM Marylen PontoMcneil, Ja-Kwan wrote: Reason for CRM: Marnette BurgessMonica Bartorelli Nurse Practitioner with [employer] reports PAD Testing done and pt right leg moderate change 0.89 left leg normal. Cb# [phone]

## 2017-12-30 NOTE — Telephone Encounter (Signed)
Will send to Dr. K as FYI 

## 2018-01-12 ENCOUNTER — Other Ambulatory Visit: Payer: Self-pay | Admitting: Allergy and Immunology

## 2018-01-12 ENCOUNTER — Other Ambulatory Visit: Payer: Self-pay | Admitting: Internal Medicine

## 2018-01-13 ENCOUNTER — Ambulatory Visit: Payer: Self-pay

## 2018-01-13 NOTE — Telephone Encounter (Signed)
Patient called in with c/o "lightheaded, dizziness." He says "it started 4-5 days ago and I feel fatigued, drained, like I want to sleep. My BP today was 108/62 this morning and now it is 123/67 Pulse 74. My blood sugar was 107. The room doesn't spin." I asked about other symptoms, he denies. According to protocol, see PCP within 3 days, appointment scheduled for tomorrow at 1030 with Dr. Amador CunasKwiatkowski, care advice given, patient verbalized understanding.   Reason for Disposition . [1] MILD dizziness (e.g., walking normally) AND [2] has NOT been evaluated by physician for this  (Exception: dizziness caused by heat exposure, sudden standing, or poor fluid intake)  Answer Assessment - Initial Assessment Questions 1. DESCRIPTION: "Describe your dizziness."     Fatigued, drained 2. LIGHTHEADED: "Do you feel lightheaded?" (e.g., somewhat faint, woozy, weak upon standing)     Yes 3. VERTIGO: "Do you feel like either you or the room is spinning or tilting?" (i.e. vertigo)     No 4. SEVERITY: "How bad is it?"  "Do you feel like you are going to faint?" "Can you stand and walk?"   - MILD - walking normally   - MODERATE - interferes with normal activities (e.g., work, school)    - SEVERE - unable to stand, requires support to walk, feels like passing out now.      Mild 5. ONSET:  "When did the dizziness begin?"     4-5 days ago 6. AGGRAVATING FACTORS: "Does anything make it worse?" (e.g., standing, change in head position)     Standing 7. HEART RATE: "Can you tell me your heart rate?" "How many beats in 15 seconds?"  (Note: not all patients can do this)       74 per BP monitor 8. CAUSE: "What do you think is causing the dizziness?"     I don't know 9. RECURRENT SYMPTOM: "Have you had dizziness before?" If so, ask: "When was the last time?" "What happened that time?"     No 10. OTHER SYMPTOMS: "Do you have any other symptoms?" (e.g., fever, chest pain, vomiting, diarrhea, bleeding)       Fatigued,  tired 11. PREGNANCY: "Is there any chance you are pregnant?" "When was your last menstrual period?"      N/A  Protocols used: DIZZINESS [facility]- LIGHTHEADEDNESS-A-AH

## 2018-01-14 ENCOUNTER — Ambulatory Visit: Payer: Medicare Other | Admitting: Internal Medicine

## 2018-01-14 ENCOUNTER — Encounter: Payer: Self-pay | Admitting: Internal Medicine

## 2018-01-14 VITALS — BP 100/60 | HR 74 | Temp 97.6°F | Wt 220.8 lb

## 2018-01-14 DIAGNOSIS — I10 Essential (primary) hypertension: Secondary | ICD-10-CM

## 2018-01-14 DIAGNOSIS — I252 Old myocardial infarction: Secondary | ICD-10-CM | POA: Diagnosis not present

## 2018-01-14 DIAGNOSIS — N183 Chronic kidney disease, stage 3 unspecified: Secondary | ICD-10-CM

## 2018-01-14 LAB — COMPREHENSIVE METABOLIC PANEL
ALBUMIN: 4 g/dL (ref 3.5–5.2)
ALK PHOS: 79 U/L (ref 39–117)
ALT: 24 U/L (ref 0–53)
AST: 18 U/L (ref 0–37)
BILIRUBIN TOTAL: 0.3 mg/dL (ref 0.2–1.2)
BUN: 24 mg/dL — ABNORMAL HIGH (ref 6–23)
CALCIUM: 10.1 mg/dL (ref 8.4–10.5)
CO2: 26 mEq/L (ref 19–32)
Chloride: 102 mEq/L (ref 96–112)
Creatinine, Ser: 1.55 mg/dL — ABNORMAL HIGH (ref 0.40–1.50)
GFR: 46.45 mL/min — AB (ref >60.00)
Glucose, Bld: 119 mg/dL — ABNORMAL HIGH (ref 70–99)
Potassium: 5.2 mEq/L — ABNORMAL HIGH (ref 3.5–5.1)
Sodium: 134 mEq/L — ABNORMAL LOW (ref 135–145)
TOTAL PROTEIN: 6.3 g/dL (ref 6.0–8.3)

## 2018-01-14 LAB — CBC WITH DIFFERENTIAL/PLATELET
BASOS ABS: 0 10*3/uL (ref 0.0–0.1)
Basophils Relative: 0.7 % (ref 0.0–3.0)
Eosinophils Absolute: 0.2 10*3/uL (ref 0.0–0.7)
Eosinophils Relative: 2.8 % (ref 0.0–5.0)
HEMATOCRIT: 36.6 % — AB (ref 39.0–52.0)
HEMOGLOBIN: 12.3 g/dL — AB (ref 13.0–17.0)
Lymphocytes Relative: 28.8 % (ref 12.0–46.0)
Lymphs Abs: 1.8 10*3/uL (ref 0.7–4.0)
MCHC: 33.6 g/dL (ref 30.0–36.0)
MCV: 101.8 fl — AB (ref 78.0–100.0)
MONOS PCT: 9.7 % (ref 3.0–12.0)
Monocytes Absolute: 0.6 10*3/uL (ref 0.1–1.0)
NEUTROS ABS: 3.6 10*3/uL (ref 1.4–7.7)
Neutrophils Relative %: 58 % (ref 43.0–77.0)
Platelets: 275 10*3/uL (ref 150.0–400.0)
RBC: 3.6 Mil/uL — AB (ref 4.22–5.81)
RDW: 13.9 % (ref 11.5–15.5)
WBC: 6.1 10*3/uL (ref 4.0–10.5)

## 2018-01-14 NOTE — Patient Instructions (Addendum)
Hold ramipril  Decrease metoprolol to 25 mg daily  Return as scheduled for your annual exam  Drink as much fluid as you  can tolerate over the next few days  Trial of ranitidine instead of Protonix

## 2018-01-14 NOTE — Progress Notes (Signed)
Subjective:    Patient ID: Richard Ho, male    DOB: 1940/07/26, 77 y.o.   MRN: [id number]  HPI  77 year old patient who has a history of essential hypertension coronary artery disease as well as chronic kidney disease. For the past few days he is noted to have some hypotensive blood pressure readings associated with dizziness weakness and fatigue  Past Medical History:  Diagnosis Date  . Asthma   . Coronary artery disease 1991   a) Angioplasty LAD 1991 [place] b) MI in 1995 at  [facility] in [place], med Rx  . Diabetes mellitus   . GERD (gastroesophageal reflux disease)   . Hearing loss   . HTN (hypertension)   . Hyperlipidemia   . Myocardial infarct (HCC)   . Nephrolithiasis   . Osteopenia   . Snake bite      Social History   Socioeconomic History  . Marital status: Married    Spouse name: Not on file  . Number of children: Not on file  . Years of education: Not on file  . Highest education level: Not on file  Occupational History  . Occupation: Retired  Engineer, production  . Financial resource strain: Not on file  . Food insecurity:    Worry: Not on file    Inability: Not on file  . Transportation needs:    Medical: Not on file    Non-medical: Not on file  Tobacco Use  . Smoking status: Never Smoker  . Smokeless tobacco: Never Used  Substance and Sexual Activity  . Alcohol use: Yes    Alcohol/week: 0.0 standard drinks    Comment: occ, once every 2 months  . Drug use: No  . Sexual activity: Not on file  Lifestyle  . Physical activity:    Days per week: Not on file    Minutes per session: Not on file  . Stress: Not on file  Relationships  . Social connections:    Talks on phone: Not on file    Gets together: Not on file    Attends religious service: Not on file    Active member of club or organization: Not on file    Attends meetings of clubs or organizations: Not on file    Relationship status: Not on file  . Intimate partner violence:   Fear of current or ex partner: Not on file    Emotionally abused: Not on file    Physically abused: Not on file    Forced sexual activity: Not on file  Other Topics Concern  . Not on file  Social History Narrative  . Not on file    Past Surgical History:  Procedure Laterality Date  . ANGIOPLASTY  1991  . CARDIAC CATHETERIZATION  2005   Moderate LCx and LAD disease and severe diffuse RCA disease  . CARDIAC CATHETERIZATION  01/2012   normal left main, 50% pLAD stenosis, dLAD mild luminal irregularities, D1 30-40% stenosis at take off; subtotally occluded/severely diseased OM1, mid 80% stenosis in small OM2, long prox and mid RCA stenoses ranging from 60-95%, severely diseased PDA, distal PLSA occluded filling via L-R collateral; normal LV function; inferior lateral basal akinesis.  . CHOLECYSTECTOMY  2012  . EYE SURGERY  2010   cataract - right  . INGUINAL HERNIA REPAIR    . LEFT HEART CATHETERIZATION WITH CORONARY ANGIOGRAM N/A 01/28/2012   Procedure: LEFT HEART CATHETERIZATION WITH CORONARY ANGIOGRAM;  Surgeon: Iran Ouch, MD;  Location: [facility] CATH LAB;  Service: Cardiovascular;  Laterality: N/A;  . LEFT HEART CATHETERIZATION WITH CORONARY ANGIOGRAM N/A 03/17/2014   Procedure: LEFT HEART CATHETERIZATION WITH CORONARY ANGIOGRAM;  Surgeon: Kathleene Hazelhristopher D McAlhany, MD;  [facility];  Service: Cardiovascular;  Laterality: N/A;  . ORIF TIBIA & FIBULA FRACTURES    . PTCA    . VASECTOMY      Family History  Problem Relation Age of Onset  . Asthma Mother   . Allergies Sister   . Coronary artery disease Other     Allergies  Allergen Reactions  . Meloxicam Other (See Comments)    "jumping out of my skin" Pt feels anxiety attacks when on this medicine    Current Outpatient Medications on File Prior to Visit  Medication Sig Dispense Refill  . albuterol (VENTOLIN HFA) 108 (90 Base) MCG/ACT inhaler Inhale two puffs every four to six hours as needed for cough or wheeze. 1  Inhaler 1  . aspirin EC 81 MG tablet Take 81 mg by mouth daily.    Marland Kitchen. b complex vitamins capsule Take 1 capsule by mouth daily.    . fluticasone (FLONASE) 50 MCG/ACT nasal spray Use one to two sprays in each nostril once daily as directed. 48 g 1  . fluticasone (FLOVENT HFA) 110 MCG/ACT inhaler Inhale three puffs three times daily during flare-up.  Rinse, gargle, and spit after use. 3 Inhaler 1  . fluticasone furoate-vilanterol (BREO ELLIPTA) 200-25 MCG/INH AEPB Inhale 1 puff into the lungs daily. Rinse, gargle, and spit after use. 180 each 1  . gabapentin (NEURONTIN) 300 MG capsule Take 2 capsules (600 mg total) by mouth 3 (three) times daily. 180 capsule 6  . glucose blood (PRECISION XTRA TEST STRIPS) test strip USE TO CHECK BLOOD SUGAR DAILY 100 each 3  . ipratropium (ATROVENT) 0.06 % nasal spray Can use two sprays in each nostril every six hours if needed to dry up runny nose. 45 mL 1  . isosorbide mononitrate (IMDUR) 30 MG 24 hr tablet TAKE ONE TABLET BY MOUTH TWICE DAILY 180 tablet 3  . levothyroxine (SYNTHROID, LEVOTHROID) 50 MCG tablet Take 1 tablet (50 mcg total) by mouth daily. 90 tablet 3  . Melatonin 3 MG CAPS Take 1 capsule by mouth at bedtime.    . metFORMIN (GLUCOPHAGE) 500 MG tablet Take 500 mg by mouth 2 (two) times daily with a meal.    . metoprolol (LOPRESSOR) 50 MG tablet Take 1 tablet (50 mg total) by mouth 2 (two) times daily. 180 tablet 3  . montelukast (SINGULAIR) 10 MG tablet Take 1 tablet (10 mg total) by mouth daily. 90 tablet 1  . nitroGLYCERIN (NITROSTAT) 0.4 MG SL tablet Place 1 tablet (0.4 mg total) under the tongue every 5 (five) minutes as needed. Chest pain 25 tablet 3  . nortriptyline (PAMELOR) 25 MG capsule TAKE ONE CAPSULE BY MOUTH AT BEDTIME 90 capsule 0  . olopatadine (PATANOL) 0.1 % ophthalmic solution Can use one drop in each eye one to two times daily if needed. 5 mL 5  . pantoprazole (PROTONIX) 20 MG tablet Take 20 mg by mouth daily.    . ramipril (ALTACE)  5 MG capsule TAKE 1 CAPSULE BY MOUTH ONCE DAILY 90 capsule 1  . ranitidine (ZANTAC) 300 MG tablet TAKE 1 TABLET BY MOUTH ONCE DAILY AT BEDTIME 30 tablet 3  . simvastatin (ZOCOR) 80 MG tablet Take 40 mg by mouth at bedtime. [place] [facility]    . tamsulosin (FLOMAX) 0.4 MG CAPS capsule TAKE  ONE CAPSULE BY MOUTH DAILY 30 capsule 5  . traZODone (DESYREL) 50 MG tablet TAKE ONE-HALF TO ONE TABLET BY MOUTH AT BEDTIME AS NEEDED FOR SLEEP 90 tablet 2   No current facility-administered medications on file prior to visit.     BP 100/60 (BP Location: Right Arm, Patient Position: Sitting, Cuff Size: Large)   Pulse 74   Temp 97.6 F (36.4 C) (Oral)   Wt 220 lb 12.8 oz (100.2 kg)   SpO2 96%   BMI 31.68 kg/m     Review of Systems  Constitutional: Positive for activity change and fatigue. Negative for appetite change, chills and fever.  HENT: Negative for congestion, dental problem, ear pain, hearing loss, sore throat, tinnitus, trouble swallowing and voice change.   Eyes: Negative for pain, discharge and visual disturbance.  Respiratory: Negative for cough, chest tightness, wheezing and stridor.   Cardiovascular: Negative for chest pain, palpitations and leg swelling.  Gastrointestinal: Negative for abdominal distention, abdominal pain, blood in stool, constipation, diarrhea, nausea and vomiting.  Genitourinary: Negative for difficulty urinating, discharge, flank pain, genital sores, hematuria and urgency.  Musculoskeletal: Negative for arthralgias, back pain, gait problem, joint swelling, myalgias and neck stiffness.  Skin: Negative for rash.  Neurological: Positive for dizziness, weakness and light-headedness. Negative for syncope, speech difficulty, numbness and headaches.  Hematological: Negative for adenopathy. Does not bruise/bleed easily.  Psychiatric/Behavioral: Negative for behavioral problems and dysphoric mood. The patient is not nervous/anxious.        Objective:   Physical Exam    Constitutional: He appears well-developed and well-nourished. No distress.  Blood pressure 100/55 sitting Blood pressure 104/60 standing  Cardiovascular: Normal rate and regular rhythm.  Pulmonary/Chest: Effort normal and breath sounds normal.          Assessment & Plan:   Hypotension.  Symptomatic.  Will hold ramipril at this time and decrease metoprolol to 25 mg once daily.  If patient remains symptomatic we will also hold isosorbide mononitrate.  He is scheduled for follow-up and annual exam in 2 weeks.  Will check CBC and chemistries.  He will report any clinical worsening Coronary artery disease Chronic kidney disease  Gordy Savers

## 2018-02-01 ENCOUNTER — Ambulatory Visit (INDEPENDENT_AMBULATORY_CARE_PROVIDER_SITE_OTHER): Payer: Medicare Other | Admitting: Internal Medicine

## 2018-02-01 ENCOUNTER — Encounter: Payer: Self-pay | Admitting: Internal Medicine

## 2018-02-01 ENCOUNTER — Other Ambulatory Visit: Payer: Self-pay | Admitting: Internal Medicine

## 2018-02-01 VITALS — BP 120/80 | HR 83 | Temp 97.6°F | Ht 71.0 in | Wt 220.2 lb

## 2018-02-01 DIAGNOSIS — E039 Hypothyroidism, unspecified: Secondary | ICD-10-CM | POA: Diagnosis not present

## 2018-02-01 DIAGNOSIS — Z0000 Encounter for general adult medical examination without abnormal findings: Secondary | ICD-10-CM

## 2018-02-01 DIAGNOSIS — I2510 Atherosclerotic heart disease of native coronary artery without angina pectoris: Secondary | ICD-10-CM | POA: Diagnosis not present

## 2018-02-01 DIAGNOSIS — E785 Hyperlipidemia, unspecified: Secondary | ICD-10-CM | POA: Diagnosis not present

## 2018-02-01 DIAGNOSIS — E1121 Type 2 diabetes mellitus with diabetic nephropathy: Secondary | ICD-10-CM | POA: Diagnosis not present

## 2018-02-01 DIAGNOSIS — E1142 Type 2 diabetes mellitus with diabetic polyneuropathy: Secondary | ICD-10-CM

## 2018-02-01 DIAGNOSIS — I10 Essential (primary) hypertension: Secondary | ICD-10-CM | POA: Diagnosis not present

## 2018-02-01 LAB — LIPID PANEL
CHOL/HDL RATIO: 4
Cholesterol: 139 mg/dL (ref 0–200)
HDL: 38.1 mg/dL — ABNORMAL LOW (ref >39.00)
LDL Cholesterol: 64 mg/dL (ref 0–99)
NonHDL: 101.33
Triglycerides: 185 mg/dL — ABNORMAL HIGH (ref 0.0–149.0)
VLDL: 37 mg/dL (ref 0.0–40.0)

## 2018-02-01 LAB — MICROALBUMIN / CREATININE URINE RATIO
Creatinine,U: 82.6 mg/dL
MICROALB/CREAT RATIO: 1.1 mg/g (ref 0.0–30.0)
Microalb, Ur: 0.9 mg/dL (ref 0.0–1.9)

## 2018-02-01 LAB — TSH: TSH: 4.48 u[IU]/mL (ref 0.35–4.50)

## 2018-02-01 LAB — HEMOGLOBIN A1C: HEMOGLOBIN A1C: 7.5 % — AB (ref 4.6–6.5)

## 2018-02-01 MED ORDER — GLIPIZIDE ER 2.5 MG PO TB24: 2.5000 mg | ORAL_TABLET | Freq: Every day | ORAL | 3 refills | Status: DC

## 2018-02-01 NOTE — Progress Notes (Signed)
Subjective:    Patient ID: Richard Ho, male    DOB: 07/11/1940, 77 y.o.   MRN: [id number]  HPI  77 year old patient who is seen today for a annual preventive health examination as well as a subsequent Medicare wellness visit He has a history of type 2 diabetes complicated by peripheral neuropathy.  He has coronary artery disease which has been stable.  He is also followed at the [place] and has had recent blepharoplasty.  No cardiopulmonary complaints.  He was seen recently with symptomatic hypotension and his medications down titrated.  He states that he had significant hypertension and basically is back on his previous regimen.  No further hypotension or any orthostatic symptoms.  Past Medical History:  Diagnosis Date  . Asthma   . Coronary artery disease 1991   a) Angioplasty LAD 1991 [place] b) MI in 1995 at  [facility] in [place], med Rx  . Diabetes mellitus   . GERD (gastroesophageal reflux disease)   . Hearing loss   . HTN (hypertension)   . Hyperlipidemia   . Myocardial infarct (HCC)   . Nephrolithiasis   . Osteopenia   . Snake bite      Social History   Socioeconomic History  . Marital status: Married    Spouse name: Not on file  . Number of children: Not on file  . Years of education: Not on file  . Highest education level: Not on file  Occupational History  . Occupation: Retired  Engineer, production  . Financial resource strain: Not on file  . Food insecurity:    Worry: Not on file    Inability: Not on file  . Transportation needs:    Medical: Not on file    Non-medical: Not on file  Tobacco Use  . Smoking status: Never Smoker  . Smokeless tobacco: Never Used  Substance and Sexual Activity  . Alcohol use: Yes    Alcohol/week: 0.0 standard drinks    Comment: occ, once every 2 months  . Drug use: No  . Sexual activity: Not on file  Lifestyle  . Physical activity:    Days per week: Not on file    Minutes per session: Not on file  . Stress: Not  on file  Relationships  . Social connections:    Talks on phone: Not on file    Gets together: Not on file    Attends religious service: Not on file    Active member of club or organization: Not on file    Attends meetings of clubs or organizations: Not on file    Relationship status: Not on file  . Intimate partner violence:    Fear of current or ex partner: Not on file    Emotionally abused: Not on file    Physically abused: Not on file    Forced sexual activity: Not on file  Other Topics Concern  . Not on file  Social History Narrative  . Not on file    Past Surgical History:  Procedure Laterality Date  . ANGIOPLASTY  1991  . CARDIAC CATHETERIZATION  2005   Moderate LCx and LAD disease and severe diffuse RCA disease  . CARDIAC CATHETERIZATION  01/2012   normal left main, 50% pLAD stenosis, dLAD mild luminal irregularities, D1 30-40% stenosis at take off; subtotally occluded/severely diseased OM1, mid 80% stenosis in small OM2, long prox and mid RCA stenoses ranging from 60-95%, severely diseased PDA, distal PLSA occluded filling via L-R collateral; normal LV  function; inferior lateral basal akinesis.  . CHOLECYSTECTOMY  2012  . EYE SURGERY  2010   cataract - right  . INGUINAL HERNIA REPAIR    . LEFT HEART CATHETERIZATION WITH CORONARY ANGIOGRAM N/A 01/28/2012   Procedure: LEFT HEART CATHETERIZATION WITH CORONARY ANGIOGRAM;  Surgeon: Iran OuchMuhammad A Arida, MD;  Location: [facility] CATH LAB;  Service: Cardiovascular;  Laterality: N/A;  . LEFT HEART CATHETERIZATION WITH CORONARY ANGIOGRAM N/A 03/17/2014   Procedure: LEFT HEART CATHETERIZATION WITH CORONARY ANGIOGRAM;  Surgeon: Kathleene Hazelhristopher D McAlhany, MD;  [facility];  Service: Cardiovascular;  Laterality: N/A;  . ORIF TIBIA & FIBULA FRACTURES    . PTCA    . VASECTOMY      Family History  Problem Relation Age of Onset  . Asthma Mother   . Allergies Sister   . Coronary artery disease Other     Allergies  Allergen Reactions    . Meloxicam Other (See Comments)    "jumping out of my skin" Pt feels anxiety attacks when on this medicine    Current Outpatient Medications on File Prior to Visit  Medication Sig Dispense Refill  . albuterol (VENTOLIN HFA) 108 (90 Base) MCG/ACT inhaler Inhale two puffs every four to six hours as needed for cough or wheeze. 1 Inhaler 1  . aspirin EC 81 MG tablet Take 81 mg by mouth daily.    Marland Kitchen. b complex vitamins capsule Take 1 capsule by mouth daily.    . fluticasone (FLONASE) 50 MCG/ACT nasal spray Use one to two sprays in each nostril once daily as directed. 48 g 1  . fluticasone (FLOVENT HFA) 110 MCG/ACT inhaler Inhale three puffs three times daily during flare-up.  Rinse, gargle, and spit after use. 3 Inhaler 1  . fluticasone furoate-vilanterol (BREO ELLIPTA) 200-25 MCG/INH AEPB Inhale 1 puff into the lungs daily. Rinse, gargle, and spit after use. 180 each 1  . gabapentin (NEURONTIN) 300 MG capsule Take 2 capsules (600 mg total) by mouth 3 (three) times daily. 180 capsule 6  . glucose blood (PRECISION XTRA TEST STRIPS) test strip USE TO CHECK BLOOD SUGAR DAILY 100 each 3  . ipratropium (ATROVENT) 0.06 % nasal spray Can use two sprays in each nostril every six hours if needed to dry up runny nose. 45 mL 1  . isosorbide mononitrate (IMDUR) 30 MG 24 hr tablet TAKE ONE TABLET BY MOUTH TWICE DAILY 180 tablet 3  . levothyroxine (SYNTHROID, LEVOTHROID) 50 MCG tablet Take 1 tablet (50 mcg total) by mouth daily. 90 tablet 3  . metFORMIN (GLUCOPHAGE) 500 MG tablet Take 500 mg by mouth 2 (two) times daily with a meal.    . metoprolol (LOPRESSOR) 50 MG tablet Take 1 tablet (50 mg total) by mouth 2 (two) times daily. 180 tablet 3  . montelukast (SINGULAIR) 10 MG tablet Take 1 tablet (10 mg total) by mouth daily. 90 tablet 1  . nitroGLYCERIN (NITROSTAT) 0.4 MG SL tablet Place 1 tablet (0.4 mg total) under the tongue every 5 (five) minutes as needed. Chest pain 25 tablet 3  . nortriptyline (PAMELOR)  25 MG capsule TAKE ONE CAPSULE BY MOUTH AT BEDTIME 90 capsule 0  . olopatadine (PATANOL) 0.1 % ophthalmic solution Can use one drop in each eye one to two times daily if needed. 5 mL 5  . ramipril (ALTACE) 5 MG capsule TAKE 1 CAPSULE BY MOUTH ONCE DAILY 90 capsule 1  . ranitidine (ZANTAC) 300 MG tablet TAKE 1 TABLET BY MOUTH ONCE DAILY AT BEDTIME 30  tablet 3  . simvastatin (ZOCOR) 80 MG tablet Take 40 mg by mouth at bedtime. Rx from [place]    . tamsulosin (FLOMAX) 0.4 MG CAPS capsule TAKE ONE CAPSULE BY MOUTH DAILY 30 capsule 5  . traZODone (DESYREL) 50 MG tablet TAKE ONE-HALF TO ONE TABLET BY MOUTH AT BEDTIME AS NEEDED FOR SLEEP 90 tablet 2   No current facility-administered medications on file prior to visit.     BP 120/80 (BP Location: Right Arm, Patient Position: Sitting, Cuff Size: Normal)   Pulse 83   Temp 97.6 F (36.4 C) (Oral)   Ht 5\' 11"  (1.803 m)   Wt 220 lb 3.2 oz (99.9 kg)   SpO2 94%   BMI 30.71 kg/m   Subsequent Medicare wellness visit  1. Risk factors, based on past  M,S,F history.  Patient has known coronary artery disease.  Cardiovascular risk factors include type 2 diabetes hypertension and dyslipidemia  2.  Physical activities: No rigorous exercise regimen.  Does walk some but is fearful of falling  3.  Depression/mood: No history major depression or mood disorder  4.  Hearing: Wears hearing aids  5.  ADL's: Independent  6.  Fall risk: Moderately high due to peripheral neuropathy and numb feet  7.  Home safety: No issues identified  8.  Height weight, and visual acuity; height and weight stable no change in visual acuity is followed closely by ophthalmology  9.  Counseling: More rigorous activity as well as modest weight loss encouraged  10. Lab orders based on risk factors: Laboratory update will be reviewed including hemoglobin A1c urine for microalbumin and lipid profile  11. Referral : Follow-up ophthalmology  12. Care plan: Continue efforts at  aggressive risk factor modification  13. Cognitive assessment: Alert and appropriate normal affect.  No cognitive dysfunction  14. Screening: Patient provided with a written and personalized 5-10 year screening schedule in the AVS.    15. Provider List Update: Primary care ophthalmology allergy medicine and cardiology    Review of Systems  Constitutional: Negative for appetite change, chills, fatigue and fever.  HENT: Negative for congestion, dental problem, ear pain, hearing loss, sore throat, tinnitus, trouble swallowing and voice change.   Eyes: Negative for pain, discharge and visual disturbance.  Respiratory: Negative for cough, chest tightness, wheezing and stridor.   Cardiovascular: Negative for chest pain, palpitations and leg swelling.  Gastrointestinal: Negative for abdominal distention, abdominal pain, blood in stool, constipation, diarrhea, nausea and vomiting.  Genitourinary: Negative for difficulty urinating, discharge, flank pain, genital sores, hematuria and urgency.  Musculoskeletal: Negative for arthralgias, back pain, gait problem, joint swelling, myalgias and neck stiffness.  Skin: Negative for rash.  Neurological: Positive for numbness. Negative for dizziness, syncope, speech difficulty, weakness and headaches.  Hematological: Negative for adenopathy. Does not bruise/bleed easily.  Psychiatric/Behavioral: Negative for behavioral problems and dysphoric mood. The patient is not nervous/anxious.        Objective:   Physical Exam  Constitutional: He appears well-developed and well-nourished.  Weight 220 Blood pressure 116/74  HENT:  Head: Normocephalic and atraumatic.  Right Ear: External ear normal.  Left Ear: External ear normal.  Nose: Nose normal.  Mouth/Throat: Oropharynx is clear and moist.  Eyes: Pupils are equal, round, and reactive to light. Conjunctivae and EOM are normal. No scleral icterus.  Neck: Normal range of motion. Neck supple. No JVD present.  No thyromegaly present.  Cardiovascular: Regular rhythm, normal heart sounds and intact distal pulses. Exam reveals no gallop and no  friction rub.  No murmur heard. Pulmonary/Chest: Effort normal and breath sounds normal. He exhibits no tenderness.  Few bibasilar crackles  Abdominal: Soft. Bowel sounds are normal. He exhibits no distension and no mass. There is no tenderness.  Genitourinary: Prostate normal and penis normal.  Musculoskeletal: Normal range of motion. He exhibits no edema or tenderness.  Lymphadenopathy:    He has no cervical adenopathy.  Neurological: He is alert. He has normal reflexes. No cranial nerve deficit. Coordination normal.  Marked decrease of vibratory sensation distal to the knees  Skin: Skin is warm and dry. No rash noted.  Psychiatric: He has a normal mood and affect. His behavior is normal.          Assessment & Plan:   Preventive health examination Subsequent Medicare wellness visit Diabetes mellitus.  Will check hemoglobin A1c lipid profile and urine for microalbumin Coronary artery disease stable.  Follow-up cardiology Essential hypertension stable.  No further orthostatic symptoms.  No change in therapy today Dyslipidemia review lipid profile  Gordy Savers

## 2018-02-01 NOTE — Patient Instructions (Signed)
Limit your sodium (Salt) intake    It is important that you exercise regularly, at least 20 minutes 3 to 4 times per week.  If you develop chest pain or shortness of breath seek  medical attention.  You need to lose weight.  Consider a lower calorie diet and regular exercise.  Return in 6 months for follow-up  Cardiology follow-up as scheduled  Please see your eye doctor yearly to check for diabetic eye damage

## 2018-02-11 ENCOUNTER — Encounter: Payer: Self-pay | Admitting: Cardiovascular Disease

## 2018-02-11 ENCOUNTER — Ambulatory Visit: Payer: Medicare Other | Admitting: Cardiovascular Disease

## 2018-02-11 VITALS — BP 110/60 | HR 80 | Ht 71.0 in | Wt 222.0 lb

## 2018-02-11 DIAGNOSIS — I10 Essential (primary) hypertension: Secondary | ICD-10-CM

## 2018-02-11 DIAGNOSIS — E7800 Pure hypercholesterolemia, unspecified: Secondary | ICD-10-CM | POA: Diagnosis not present

## 2018-02-11 DIAGNOSIS — I25118 Atherosclerotic heart disease of native coronary artery with other forms of angina pectoris: Secondary | ICD-10-CM | POA: Diagnosis not present

## 2018-02-11 DIAGNOSIS — I493 Ventricular premature depolarization: Secondary | ICD-10-CM | POA: Diagnosis not present

## 2018-02-11 NOTE — Progress Notes (Signed)
Chief Complaint  Patient presents with  . Follow-up    CAD   History of Present Illness: 77 yo male with history of CAD, HTN, HLD, DM here today for cardiac follow up. He has a history of coronary angioplasty in [place] and in [place] in the 1990s. Last cardiac cath October 2015 with stable moderate CAD. The RCA is diffusely diseased with no focal targets for PCI.  Echo August 2018 with normal LV systolic function, grade 2 diastolic dysfunction and no significant valve disease.     He is here today for follow up. The patient denies any dyspnea, palpitations, lower extremity edema, orthopnea, PND, dizziness, near syncope or syncope. He has continued mild chest pain with moderate exertion.   Primary Care Physician: Gordy Savers, MD  Past Medical History:  Diagnosis Date  . Asthma   . Coronary artery disease 1991   a) Angioplasty LAD 1991 [place] b) MI in 1995 at  [facility] in [place], med Rx  . Diabetes mellitus   . GERD (gastroesophageal reflux disease)   . Hearing loss   . HTN (hypertension)   . Hyperlipidemia   . Myocardial infarct (HCC)   . Nephrolithiasis   . Osteopenia   . Snake bite     Past Surgical History:  Procedure Laterality Date  . ANGIOPLASTY  1991  . CARDIAC CATHETERIZATION  2005   Moderate LCx and LAD disease and severe diffuse RCA disease  . CARDIAC CATHETERIZATION  01/2012   normal left main, 50% pLAD stenosis, dLAD mild luminal irregularities, D1 30-40% stenosis at take off; subtotally occluded/severely diseased OM1, mid 80% stenosis in small OM2, long prox and mid RCA stenoses ranging from 60-95%, severely diseased PDA, distal PLSA occluded filling via L-R collateral; normal LV function; inferior lateral basal akinesis.  . CHOLECYSTECTOMY  2012  . EYE SURGERY  2010   cataract - right  . INGUINAL HERNIA REPAIR    . LEFT HEART CATHETERIZATION WITH CORONARY ANGIOGRAM N/A 01/28/2012   Procedure: LEFT HEART CATHETERIZATION WITH CORONARY  ANGIOGRAM;  Surgeon: Iran Ouch, MD;  Location: [facility] CATH LAB;  Service: Cardiovascular;  Laterality: N/A;  . LEFT HEART CATHETERIZATION WITH CORONARY ANGIOGRAM N/A 03/17/2014   Procedure: LEFT HEART CATHETERIZATION WITH CORONARY ANGIOGRAM;  Surgeon: Kathleene Hazel, MD;  Location: [facility] CATH LAB;  Service: Cardiovascular;  Laterality: N/A;  . ORIF TIBIA & FIBULA FRACTURES    . PTCA    . VASECTOMY      Current Outpatient Medications  Medication Sig Dispense Refill  . albuterol (VENTOLIN HFA) 108 (90 Base) MCG/ACT inhaler Inhale two puffs every four to six hours as needed for cough or wheeze. 1 Inhaler 1  . aspirin EC 81 MG tablet Take 81 mg by mouth daily.    Marland Kitchen b complex vitamins capsule Take 1 capsule by mouth daily.    . fluticasone (FLONASE) 50 MCG/ACT nasal spray Use one to two sprays in each nostril once daily as directed. 48 g 1  . fluticasone (FLOVENT HFA) 110 MCG/ACT inhaler Inhale three puffs three times daily during flare-up.  Rinse, gargle, and spit after use. 3 Inhaler 1  . fluticasone furoate-vilanterol (BREO ELLIPTA) 200-25 MCG/INH AEPB Inhale 1 puff into the lungs daily. Rinse, gargle, and spit after use. 180 each 1  . gabapentin (NEURONTIN) 300 MG capsule Take 2 capsules (600 mg total) by mouth 3 (three) times daily. 180 capsule 6  . glipiZIDE (GLUCOTROL XL) 2.5 MG 24 hr tablet Take 1 tablet (  2.5 mg total) by mouth daily with breakfast. 90 tablet 3  . glucose blood (PRECISION XTRA TEST STRIPS) test strip USE TO CHECK BLOOD SUGAR DAILY 100 each 3  . ipratropium (ATROVENT) 0.06 % nasal spray Can use two sprays in each nostril every six hours if needed to dry up runny nose. 45 mL 1  . isosorbide mononitrate (IMDUR) 30 MG 24 hr tablet TAKE ONE TABLET BY MOUTH TWICE DAILY 180 tablet 3  . levothyroxine (SYNTHROID, LEVOTHROID) 50 MCG tablet Take 1 tablet (50 mcg total) by mouth daily. 90 tablet 3  . metFORMIN (GLUCOPHAGE) 500 MG tablet Take 500 mg by mouth 2 (two) times daily  with a meal.    . metoprolol (LOPRESSOR) 50 MG tablet Take 1 tablet (50 mg total) by mouth 2 (two) times daily. 180 tablet 3  . montelukast (SINGULAIR) 10 MG tablet Take 1 tablet (10 mg total) by mouth daily. 90 tablet 1  . nitroGLYCERIN (NITROSTAT) 0.4 MG SL tablet Place 1 tablet (0.4 mg total) under the tongue every 5 (five) minutes as needed. Chest pain 25 tablet 3  . nortriptyline (PAMELOR) 25 MG capsule TAKE ONE CAPSULE BY MOUTH AT BEDTIME 90 capsule 0  . olopatadine (PATANOL) 0.1 % ophthalmic solution Can use one drop in each eye one to two times daily if needed. 5 mL 5  . ramipril (ALTACE) 5 MG capsule TAKE 1 CAPSULE BY MOUTH ONCE DAILY 90 capsule 1  . ranitidine (ZANTAC) 300 MG tablet TAKE 1 TABLET BY MOUTH ONCE DAILY AT BEDTIME 30 tablet 3  . simvastatin (ZOCOR) 80 MG tablet Take 40 mg by mouth at bedtime. Rx from [place]    . tamsulosin (FLOMAX) 0.4 MG CAPS capsule TAKE ONE CAPSULE BY MOUTH DAILY 30 capsule 5  . traZODone (DESYREL) 50 MG tablet TAKE ONE-HALF TO ONE TABLET BY MOUTH AT BEDTIME AS NEEDED FOR SLEEP 90 tablet 2   No current facility-administered medications for this visit.     Allergies  Allergen Reactions  . Meloxicam Other (See Comments)    "jumping out of my skin" Pt feels anxiety attacks when on this medicine    Social History   Socioeconomic History  . Marital status: Married    Spouse name: Not on file  . Number of children: Not on file  . Years of education: Not on file  . Highest education level: Not on file  Occupational History  . Occupation: Retired  Engineer, production  . Financial resource strain: Not on file  . Food insecurity:    Worry: Not on file    Inability: Not on file  . Transportation needs:    Medical: Not on file    Non-medical: Not on file  Tobacco Use  . Smoking status: Never Smoker  . Smokeless tobacco: Never Used  Substance and Sexual Activity  . Alcohol use: Yes    Alcohol/week: 0.0 standard drinks    Comment: occ, once every 2  months  . Drug use: No  . Sexual activity: Not on file  Lifestyle  . Physical activity:    Days per week: Not on file    Minutes per session: Not on file  . Stress: Not on file  Relationships  . Social connections:    Talks on phone: Not on file    Gets together: Not on file    Attends religious service: Not on file    Active member of club or organization: Not on file    Attends meetings of clubs  or organizations: Not on file    Relationship status: Not on file  . Intimate partner violence:    Fear of current or ex partner: Not on file    Emotionally abused: Not on file    Physically abused: Not on file    Forced sexual activity: Not on file  Other Topics Concern  . Not on file  Social History Narrative  . Not on file    Family History  Problem Relation Age of Onset  . Asthma Mother   . Allergies Sister   . Coronary artery disease Other     Review of Systems:  As stated in the HPI and otherwise negative.   BP 110/60   Pulse 80   Ht 5\' 11"  (1.803 m)   Wt 222 lb (100.7 kg)   SpO2 98%   BMI 30.96 kg/m   Physical Examination:  General: Well developed, well nourished, NAD  HEENT: OP clear, mucus membranes moist  SKIN: warm, dry. No rashes. Neuro: No focal deficits  Musculoskeletal: Muscle strength 5/5 all ext  Psychiatric: Mood and affect normal  Neck: No JVD, no carotid bruits, no thyromegaly, no lymphadenopathy.  Lungs:Clear bilaterally, no wheezes, rhonci, crackles Cardiovascular: Regular rate and rhythm. No murmurs, gallops or rubs. Abdomen:Soft. Bowel sounds present. Non-tender.  Extremities: No lower extremity edema. Pulses are 2 + in the bilateral DP/PT.  Echo August 2018: Left ventricle: The cavity size was normal. Wall thickness was   normal. Systolic function was normal. The estimated ejection   fraction was in the range of 55% to 60%. Wall motion was normal;   there were no regional wall motion abnormalities. Features are   consistent with a  pseudonormal left ventricular filling pattern,   with concomitant abnormal relaxation and increased filling   pressure (grade 2 diastolic dysfunction).  Cardiac cath 03/17/14: Left Main: No obstructive disease.  Left anterior Descending: Large caliber vessel that courses to the apex. The mid vessel has a 50% stenosis involving the takeoff of both diagonal branches. The distal LAD has minor luminal irregularities. The first diagonal is a moderate caliber vessel with ostial 30-40% stenosis at the ostium.  Left Circumflex: Large vessel that trifurcates quickly into 3 branches. The first OM is small in caliber and is sub-totally occluded proximally, unchanged from last cath. The second OM is small in caliber and has a mid 80% stenosis. The AV groove branch is small and provides collateral flow the the distal RCA.  Right Coronary Artery: Large dominant vessel with proximal 60% stenosis followed by 100% total occlusion in the mid vessel. There are small bridging collaterals that supply the mid and distal vessel. The distal vessel is also seen to fill from left to right collaterals.  Left Ventricular Angiogram: LVEF=45%.   EKG:  EKG is not ordered today. The ekg ordered today demonstrates   Recent Labs: 01/14/2018: ALT 24; BUN 24; Creatinine, Ser 1.55; Hemoglobin 12.3; Platelets 275.0; Potassium 5.2; Sodium 134 02/01/2018: TSH 4.48   Lipid Panel Lipid Panel     Component Value Date/Time   CHOL 139 02/01/2018 0903   TRIG 185.0 (H) 02/01/2018 0903   HDL 38.10 (L) 02/01/2018 0903   CHOLHDL 4 02/01/2018 0903   VLDL 37.0 02/01/2018 0903   LDLCALC 64 02/01/2018 0903    Wt Readings from Last 3 Encounters:  02/11/18 222 lb (100.7 kg)  02/01/18 220 lb 3.2 oz (99.9 kg)  01/14/18 220 lb 12.8 oz (100.2 kg)     Other studies Reviewed: Additional  studies/ records that were reviewed today include: . Review of the above records demonstrates:    Assessment and Plan:   1. CAD with stable angina: He  continues to have mild chest pain with moderate exertion. No changes. Last cardiac cath in October 2015 with stable moderate CAD. Continue ASA, statin, beta blocker and Imdur.     2. HTN: BP is controlled. No changes  3. HLD: Lipids followed at the [place]. Continue statin.   4. PVCs: No recent palpitations. Continue beta blocker.   Current medicines are reviewed at length with the patient today.  The patient does not have concerns regarding medicines.  The following changes have been made:  no change  Labs/ tests ordered today include:   No orders of the defined types were placed in this encounter.    Disposition:   FU with me in 12  months   Signed, Verne Carrow, MD 02/11/2018 11:53 AM    [facility] [address] [place], [place], [place]  [address] Phone: [phone]; Fax: [phone]

## 2018-02-11 NOTE — Patient Instructions (Signed)

## 2018-04-13 ENCOUNTER — Other Ambulatory Visit: Payer: Self-pay | Admitting: Allergy and Immunology

## 2018-04-14 DIAGNOSIS — H33052 Total retinal detachment, left eye: Secondary | ICD-10-CM | POA: Diagnosis not present

## 2018-04-14 DIAGNOSIS — H43393 Other vitreous opacities, bilateral: Secondary | ICD-10-CM | POA: Diagnosis not present

## 2018-04-14 DIAGNOSIS — H43813 Vitreous degeneration, bilateral: Secondary | ICD-10-CM | POA: Diagnosis not present

## 2018-04-14 DIAGNOSIS — H33012 Retinal detachment with single break, left eye: Secondary | ICD-10-CM | POA: Diagnosis not present

## 2018-04-14 DIAGNOSIS — H35371 Puckering of macula, right eye: Secondary | ICD-10-CM | POA: Diagnosis not present

## 2018-04-15 DIAGNOSIS — H33012 Retinal detachment with single break, left eye: Secondary | ICD-10-CM | POA: Diagnosis not present

## 2018-04-16 DIAGNOSIS — H33012 Retinal detachment with single break, left eye: Secondary | ICD-10-CM | POA: Diagnosis not present

## 2018-04-20 ENCOUNTER — Ambulatory Visit: Payer: Medicare Other | Admitting: Allergy and Immunology

## 2018-04-28 NOTE — Progress Notes (Signed)
Established Patient Office Visit     CC/Reason for Visit: Establish care and follow-up on chronic medical issues  HPI: Richard Ho is a 77 y.o. male who is coming in today for the above mentioned reasons. Due for annual Medicare [place] visit on 01/2019. Past Medical History is significant for: DM II complicated with peripheral neuropathy, HTN, HLD, hypothyroidism, CKD stage III,CAD followed by cardiology which is stable.  3 weeks ago he noticed some floaters and a "vertical rainbow" out of his left eye.  He immediately went to see his ophthalmologist and was diagnosed with a retinal detachment and was referred for surgery the next day.  He has been doing well postoperatively, still some issues with peripheral vision and depth perception.  He has follow-up with ophthalmology next week.  He is concerned about his A1c as it increased from 6.5-7.5 last visit.  Past Medical/Surgical History: Past Medical History:  Diagnosis Date  . Asthma   . Coronary artery disease 1991   a) Angioplasty LAD 1991 [place] b) MI in 1995 at  [facility] in [place], med Rx  . Diabetes mellitus   . GERD (gastroesophageal reflux disease)   . Hearing loss   . HTN (hypertension)   . Hyperlipidemia   . Myocardial infarct (HCC)   . Nephrolithiasis   . Osteopenia   . Snake bite     Past Surgical History:  Procedure Laterality Date  . ANGIOPLASTY  1991  . CARDIAC CATHETERIZATION  2005   Moderate LCx and LAD disease and severe diffuse RCA disease  . CARDIAC CATHETERIZATION  01/2012   normal left main, 50% pLAD stenosis, dLAD mild luminal irregularities, D1 30-40% stenosis at take off; subtotally occluded/severely diseased OM1, mid 80% stenosis in small OM2, long prox and mid RCA stenoses ranging from 60-95%, severely diseased PDA, distal PLSA occluded filling via L-R collateral; normal LV function; inferior lateral basal akinesis.  . CHOLECYSTECTOMY  2012  . EYE SURGERY  2010   cataract - right    . INGUINAL HERNIA REPAIR    . LEFT HEART CATHETERIZATION WITH CORONARY ANGIOGRAM N/A 01/28/2012   Procedure: LEFT HEART CATHETERIZATION WITH CORONARY ANGIOGRAM;  Surgeon: Iran OuchMuhammad A Arida, MD;  Location: [facility] CATH LAB;  Service: Cardiovascular;  Laterality: N/A;  . LEFT HEART CATHETERIZATION WITH CORONARY ANGIOGRAM N/A 03/17/2014   Procedure: LEFT HEART CATHETERIZATION WITH CORONARY ANGIOGRAM;  Surgeon: Kathleene Hazelhristopher D McAlhany, MD;  [facility];  Service: Cardiovascular;  Laterality: N/A;  . ORIF TIBIA & FIBULA FRACTURES    . PTCA    . VASECTOMY      Social History:  reports that he has never smoked. He has never used smokeless tobacco. He reports that he drinks alcohol. He reports that he does not use drugs.  Allergies: Allergies  Allergen Reactions  . Meloxicam Other (See Comments)    "jumping out of my skin" Pt feels anxiety attacks when on this medicine    Family History:  Family History  Problem Relation Age of Onset  . Asthma Mother   . Allergies Sister   . Coronary artery disease Other      Current Outpatient Medications:  .  albuterol (VENTOLIN HFA) 108 (90 Base) MCG/ACT inhaler, Inhale two puffs every four to six hours as needed for cough or wheeze., Disp: 1 Inhaler, Rfl: 1 .  aspirin EC 81 MG tablet, Take 81 mg by mouth daily., Disp: , Rfl:  .  b complex vitamins capsule, Take 1 capsule  by mouth daily., Disp: , Rfl:  .  fluticasone (FLONASE) 50 MCG/ACT nasal spray, Use one to two sprays in each nostril once daily as directed., Disp: 48 g, Rfl: 1 .  fluticasone (FLOVENT HFA) 110 MCG/ACT inhaler, Inhale three puffs three times daily during flare-up.  Rinse, gargle, and spit after use., Disp: 3 Inhaler, Rfl: 1 .  fluticasone furoate-vilanterol (BREO ELLIPTA) 200-25 MCG/INH AEPB, Inhale 1 puff into the lungs daily. Rinse, gargle, and spit after use., Disp: 180 each, Rfl: 1 .  gabapentin (NEURONTIN) 300 MG capsule, Take 2 capsules (600 mg total) by mouth 3 (three)  times daily., Disp: 180 capsule, Rfl: 6 .  glipiZIDE (GLUCOTROL XL) 2.5 MG 24 hr tablet, Take 1 tablet (2.5 mg total) by mouth daily with breakfast., Disp: 90 tablet, Rfl: 3 .  glucose blood (PRECISION XTRA TEST STRIPS) test strip, USE TO CHECK BLOOD SUGAR DAILY, Disp: 100 each, Rfl: 3 .  ipratropium (ATROVENT) 0.06 % nasal spray, Can use two sprays in each nostril every six hours if needed to dry up runny nose., Disp: 45 mL, Rfl: 1 .  isosorbide mononitrate (IMDUR) 30 MG 24 hr tablet, TAKE ONE TABLET BY MOUTH TWICE DAILY, Disp: 180 tablet, Rfl: 3 .  levothyroxine (SYNTHROID, LEVOTHROID) 50 MCG tablet, Take 1 tablet (50 mcg total) by mouth daily., Disp: 90 tablet, Rfl: 3 .  metFORMIN (GLUCOPHAGE) 1000 MG tablet, Take 1 tablet (1,000 mg total) by mouth 2 (two) times daily with a meal., Disp: 60 tablet, Rfl: 3 .  metoprolol (LOPRESSOR) 50 MG tablet, Take 1 tablet (50 mg total) by mouth 2 (two) times daily., Disp: 180 tablet, Rfl: 3 .  montelukast (SINGULAIR) 10 MG tablet, TAKE 1 TABLET BY MOUTH ONCE DAILY, Disp: 30 tablet, Rfl: 0 .  nitroGLYCERIN (NITROSTAT) 0.4 MG SL tablet, Place 1 tablet (0.4 mg total) under the tongue every 5 (five) minutes as needed. Chest pain, Disp: 25 tablet, Rfl: 3 .  nortriptyline (PAMELOR) 25 MG capsule, TAKE ONE CAPSULE BY MOUTH AT BEDTIME, Disp: 90 capsule, Rfl: 0 .  olopatadine (PATANOL) 0.1 % ophthalmic solution, Can use one drop in each eye one to two times daily if needed., Disp: 5 mL, Rfl: 5 .  pantoprazole (PROTONIX) 40 MG tablet, Take 40 mg by mouth daily., Disp: , Rfl:  .  ramipril (ALTACE) 5 MG capsule, TAKE 1 CAPSULE BY MOUTH ONCE DAILY, Disp: 90 capsule, Rfl: 1 .  ranitidine (ZANTAC) 300 MG tablet, TAKE 1 TABLET BY MOUTH ONCE DAILY AT BEDTIME, Disp: 30 tablet, Rfl: 3 .  simvastatin (ZOCOR) 80 MG tablet, Take 40 mg by mouth at bedtime. Rx from [place], Disp: , Rfl:  .  tamsulosin (FLOMAX) 0.4 MG CAPS capsule, TAKE ONE CAPSULE BY MOUTH DAILY, Disp: 30 capsule, Rfl:  5 .  traZODone (DESYREL) 50 MG tablet, Take 0.5 tablets (25 mg total) by mouth at bedtime as needed for sleep., Disp: 90 tablet, Rfl: 2  Review of Systems:  Constitutional: Denies fever, chills, diaphoresis, appetite change and fatigue.  HEENT: Denies photophobia, eye pain, redness, hearing loss, ear pain, congestion, sore throat, rhinorrhea, sneezing, mouth sores, trouble swallowing, neck pain, neck stiffness and tinnitus.   Respiratory: Denies SOB, DOE, cough, chest tightness,  and wheezing.   Cardiovascular: Denies chest pain, palpitations and leg swelling.  Gastrointestinal: Denies nausea, vomiting, abdominal pain, diarrhea, constipation, blood in stool and abdominal distention.  Genitourinary: Denies dysuria, urgency, frequency, hematuria, flank pain and difficulty urinating.  Endocrine: Denies: hot or cold intolerance, sweats,  changes in hair or nails, polyuria, polydipsia. Musculoskeletal: Denies myalgias, back pain, joint swelling, arthralgias and gait problem.  Skin: Denies pallor, rash and wound.  Neurological: Denies dizziness, seizures, syncope, weakness, light-headedness, numbness and headaches.  Hematological: Denies adenopathy. Easy bruising, personal or family bleeding history  Psychiatric/Behavioral: Denies suicidal ideation, mood changes, confusion, nervousness, sleep disturbance and agitation    Physical Exam: Vitals:   04/29/18 0832  BP: 130/80  Pulse: (!) 108  Temp: 97.7 F (36.5 C)  TempSrc: Oral  SpO2: 95%  Weight: 224 lb 6.4 oz (101.8 kg)    Body mass index is 31.3 kg/m.   General exam: Alert, awake, oriented x 3 Respiratory system: Clear to auscultation. Respiratory effort normal. Cardiovascular system:RRR. No murmurs, rubs, gallops. Gastrointestinal system: Abdomen is nondistended, soft and nontender. No organomegaly or masses felt. Normal bowel sounds heard. Central nervous system: Alert and oriented. No focal neurological deficits. Extremities: No  C/C/E, +pedal pulses Skin: No rashes, lesions or ulcers Psychiatry: Judgement and insight appear normal. Mood & affect appropriate.     Impression and Plan:  Dyslipidemia -LDL 64 in 8/19 -On simvastatin 80 mg.  Essential hypertension -Well-controlled -On metoprolol 50 BID, ramipril 5 QD, imdur 30.  Atherosclerosis of native coronary artery of native heart without angina pectoris -Followed by cardiology, stable.  Mild intermittent asthma without complication -Well-controlled, no issues. -atrovent nebs, singulair  Gastroesophageal reflux disease without esophagitis -Not on meds, not symptomatic.  Controlled type 2 diabetes mellitus with diabetic nephropathy, without long-term current use of insulin (HCC)  Lab Results  Component Value Date   HGBA1C 7.5 (H) 02/01/2018   -On metformin 500 BID. -A1c has increased from 6.5-7.5. -We have extensively discussed diet and lifestyle modifications, he has some work to do there. -Will refer to diabetes education. -We have also discussed increasing metformin from 500 twice daily to 1000 twice daily. -He will return in 3 months for repeat A1c.  Acquired hypothyroidism -TSH WNL in 8/19. -Continue synthroid at 50 daily.  Chronic kidney disease, stage III (moderate) (HCC) -Baseline Cr is around 1.5 with a GFR of 46. -Is already under the care of a nephrologist through the [facility].    Patient Instructions  -We will send out a referral for diabetes education.  -We will increase your metformin to 1000 mg twice daily.  Please note you may have some mild stomach upset and diarrhea that should not last longer than 2 to 3 weeks.  -We have sent out refills of your trazodone.  -Follow-up with me in 3 to 4 months for diabetes and high blood pressure check.     Chaya Jan, MD [facility]  \

## 2018-04-29 ENCOUNTER — Encounter: Payer: Self-pay | Admitting: Internal Medicine

## 2018-04-29 ENCOUNTER — Ambulatory Visit: Payer: Medicare Other | Admitting: Internal Medicine

## 2018-04-29 VITALS — BP 130/80 | HR 108 | Temp 97.7°F | Wt 224.4 lb

## 2018-04-29 DIAGNOSIS — E785 Hyperlipidemia, unspecified: Secondary | ICD-10-CM | POA: Diagnosis not present

## 2018-04-29 DIAGNOSIS — J4520 Mild intermittent asthma, uncomplicated: Secondary | ICD-10-CM | POA: Diagnosis not present

## 2018-04-29 DIAGNOSIS — H3322 Serous retinal detachment, left eye: Secondary | ICD-10-CM

## 2018-04-29 DIAGNOSIS — I2510 Atherosclerotic heart disease of native coronary artery without angina pectoris: Secondary | ICD-10-CM

## 2018-04-29 DIAGNOSIS — E039 Hypothyroidism, unspecified: Secondary | ICD-10-CM

## 2018-04-29 DIAGNOSIS — E1121 Type 2 diabetes mellitus with diabetic nephropathy: Secondary | ICD-10-CM

## 2018-04-29 DIAGNOSIS — G4700 Insomnia, unspecified: Secondary | ICD-10-CM

## 2018-04-29 DIAGNOSIS — I10 Essential (primary) hypertension: Secondary | ICD-10-CM

## 2018-04-29 DIAGNOSIS — H3320 Serous retinal detachment, unspecified eye: Secondary | ICD-10-CM | POA: Insufficient documentation

## 2018-04-29 DIAGNOSIS — K219 Gastro-esophageal reflux disease without esophagitis: Secondary | ICD-10-CM

## 2018-04-29 DIAGNOSIS — N183 Chronic kidney disease, stage 3 unspecified: Secondary | ICD-10-CM

## 2018-04-29 MED ORDER — METFORMIN HCL 1000 MG PO TABS: 1000.0000 mg | ORAL_TABLET | Freq: Two times a day (BID) | ORAL | 3 refills | Status: DC

## 2018-04-29 MED ORDER — TRAZODONE HCL 50 MG PO TABS: 25.0000 mg | ORAL_TABLET | Freq: Every evening | ORAL | 2 refills | Status: DC | PRN

## 2018-04-29 NOTE — Patient Instructions (Signed)
-  We will send out a referral for diabetes education.  -We will increase your metformin to 1000 mg twice daily.  Please note you may have some mild stomach upset and diarrhea that should not last longer than 2 to 3 weeks.  -We have sent out refills of your trazodone.  -Follow-up with me in 3 to 4 months for diabetes and high blood pressure check.

## 2018-05-14 DIAGNOSIS — H33012 Retinal detachment with single break, left eye: Secondary | ICD-10-CM | POA: Diagnosis not present

## 2018-05-16 ENCOUNTER — Other Ambulatory Visit: Payer: Self-pay | Admitting: Allergy and Immunology

## 2018-05-24 ENCOUNTER — Encounter: Payer: Medicare Other | Attending: Internal Medicine | Admitting: Registered"

## 2018-05-24 ENCOUNTER — Encounter: Payer: Self-pay | Admitting: Registered"

## 2018-05-24 DIAGNOSIS — E1122 Type 2 diabetes mellitus with diabetic chronic kidney disease: Secondary | ICD-10-CM | POA: Insufficient documentation

## 2018-05-24 DIAGNOSIS — N183 Chronic kidney disease, stage 3 (moderate): Secondary | ICD-10-CM | POA: Insufficient documentation

## 2018-05-24 DIAGNOSIS — Z713 Dietary counseling and surveillance: Secondary | ICD-10-CM | POA: Insufficient documentation

## 2018-05-24 DIAGNOSIS — E1129 Type 2 diabetes mellitus with other diabetic kidney complication: Secondary | ICD-10-CM

## 2018-05-24 NOTE — Patient Instructions (Signed)
Work on getting more structure in your meals. You can try setting your phone to remind you before you normally get hungry to give yourself time to plan a balanced meal. Eating regular meals will help your blood sugar stabilize. Consider drinking less OJ to raise blood sugar, should be about 15 grams or 1/2 cup of juice. Then eat a protein after feeling better Aim to eat vegetables daily, at least one meal Aim to eat balaced meals and snacks When you are safe to exercise more work up to the recommendation of 3-5 times per week 30-45 minutes of moderate intensity. Aim to try some of the sleep tips to get more rest.

## 2018-05-24 NOTE — Progress Notes (Signed)
Diabetes Self-Management Education  Visit Type: First/Initial  Appt. Start Time: 1030 Appt. End Time: 1145  05/24/2018  Mr. Richard FerrierLaurence Saylor, identified by name and date of birth, is a 77 y.o. male with a diagnosis of Diabetes: Type 2.   This patient is accompanied in the office by his spouse.  ASSESSMENT Pt states he self-diagnosed himself with T2DM when having symptoms of neuropathy and [facility] ([facility]) and pt states they only provide once follow up per year so he sought out a private MD to manage T2DM.  Pt states since starting Glipizide 6 weeks ago he has started having hypoglycemic symptoms. Pt reports checking BG when feeling sluggish and the last time it was 75 mg/dL his symptoms were bad enough his wife was wondering if they should go to ED. Pt reports he drank 8 oz OJ and felt better. Pt reports a random SMBG out of curiosity and was 86 mg/dL. Pt reports FBG 123 mg/dL.  Pt states he is concerned about his balance, and doesn't like to walk much because at times he is not aware of where his feet are due to neuropathy. Pt states gabapentin is effective at controlling pain.  Pt reports using oils such as canola, olive, peanut, doesn't eat veggies daily. Pt reports he was using Boost Glucose control during the day to control his hunger.  Per chart Pt has CKD stage III  Sleep 6-7 hr on a good night. 5 hrs lately.  Stress: 3-4/10   Diabetes Self-Management Education - 05/24/18 1036      Visit Information   Visit Type  First/Initial      Initial Visit   Diabetes Type  Type 2    Are you currently following a meal plan?  Yes    What type of meal plan do you follow?  [place]    Are you taking your medications as prescribed?  Yes    Date Diagnosed  2014      Health Coping   How would you rate your overall health?  Good      Psychosocial Assessment   Patient Belief/Attitude about Diabetes  Motivated to manage diabetes    How often do you need to have someone help you when you read instructions, pamphlets, or other written materials from your doctor or pharmacy?  1 - Never    What is the last grade level you completed in school?  13+      Complications   Last HgB A1C per patient/outside source  7.2 %    How often do you check your blood sugar?  --   PRN    Have you had a dilated eye exam in the past 12 months?  Yes    Have you had a dental exam in the past 12 months?  No    Are you checking your feet?  Yes    How many days per week are you checking your feet?  5      Dietary Intake   Breakfast  oatmeal OR scrambled eggs, toast, OJ with medicine OR bagel, smart balance    Lunch  left overs    Dinner  chicken or stewed beef or fish, mashed potatoes or 1/2 saffron rice,     Snack (evening)  cheese    Beverage(s)  water, OJ,       Exercise   Exercise Type  Light (walking / raking leaves)  How many days per week to you exercise?  5    How many minutes per day do you exercise?  10    Total minutes per week of exercise  50      Patient Education   Previous Diabetes Education  Yes (please comment)   2014 at [facility]   Nutrition management   Role of diet in the treatment of diabetes and the relationship between the three main macronutrients and blood glucose level    Physical activity and exercise   Role of exercise on diabetes management, blood pressure control and cardiac health.    Medications  Reviewed patients medication for diabetes, action, purpose, timing of dose and side effects.    Acute complications  Taught treatment of hypoglycemia - the 15 rule.    Psychosocial adjustment  Role of stress on diabetes      Individualized Goals (developed by patient)   Nutrition  General guidelines for healthy choices and portions discussed    Physical Activity  Exercise 3-5 times per week      Outcomes   Expected Outcomes  Demonstrated interest in learning. Expect positive outcomes    Future DMSE  4-6 wks     Program Status  Not Completed       Individualized Plan for Diabetes Self-Management Training:   Learning Objective:  Patient will have a greater understanding of diabetes self-management. Patient education plan is to attend individual and/or group sessions per assessed needs and concerns.   Patient Instructions  Work on getting more structure in your meals. You can try setting your phone to remind you before you normally get hungry to give yourself time to plan a balanced meal. Eating regular meals will help your blood sugar stabilize. Consider drinking less OJ to raise blood sugar, should be about 15 grams or 1/2 cup of juice. Then eat a protein after feeling better Aim to eat vegetables daily, at least one meal Aim to eat balaced meals and snacks When you are safe to exercise more work up to the recommendation of 3-5 times per week 30-45 minutes of moderate intensity. Aim to try some of the sleep tips to get more rest.    Expected Outcomes:  Demonstrated interest in learning. Expect positive outcomes  Education material provided: My Plate and Snack sheet , sleep hygiene  If problems or questions, patient to contact team via:  Phone  Future DSME appointment: 4-6 wks

## 2018-06-08 ENCOUNTER — Ambulatory Visit: Payer: Medicare Other | Admitting: Allergy and Immunology

## 2018-06-28 ENCOUNTER — Ambulatory Visit: Payer: Medicare Other | Admitting: Registered"

## 2018-06-29 ENCOUNTER — Ambulatory Visit: Payer: Medicare Other | Admitting: Registered"

## 2018-06-29 ENCOUNTER — Ambulatory Visit: Payer: Medicare Other | Admitting: Allergy and Immunology

## 2018-06-29 ENCOUNTER — Encounter: Payer: Self-pay | Admitting: Allergy and Immunology

## 2018-06-29 VITALS — BP 128/80 | HR 84 | Resp 16 | Ht 71.0 in | Wt 222.0 lb

## 2018-06-29 DIAGNOSIS — K219 Gastro-esophageal reflux disease without esophagitis: Secondary | ICD-10-CM | POA: Diagnosis not present

## 2018-06-29 DIAGNOSIS — J3089 Other allergic rhinitis: Secondary | ICD-10-CM

## 2018-06-29 DIAGNOSIS — J4540 Moderate persistent asthma, uncomplicated: Secondary | ICD-10-CM | POA: Diagnosis not present

## 2018-06-29 MED ORDER — OLOPATADINE HCL 0.1 % OP SOLN: OPHTHALMIC | 5 refills | Status: DC

## 2018-06-29 MED ORDER — IPRATROPIUM BROMIDE 0.06 % NA SOLN: NASAL | 1 refills | Status: DC

## 2018-06-29 MED ORDER — MONTELUKAST SODIUM 10 MG PO TABS: 10.0000 mg | ORAL_TABLET | Freq: Every day | ORAL | 1 refills | Status: DC

## 2018-06-29 MED ORDER — FLUTICASONE FUROATE-VILANTEROL 200-25 MCG/INH IN AEPB: 1.0000 | INHALATION_SPRAY | Freq: Every day | RESPIRATORY_TRACT | 1 refills | Status: DC

## 2018-06-29 MED ORDER — FLUTICASONE PROPIONATE HFA 110 MCG/ACT IN AERO: INHALATION_SPRAY | RESPIRATORY_TRACT | 1 refills | Status: DC

## 2018-06-29 MED ORDER — FLUTICASONE PROPIONATE 50 MCG/ACT NA SUSP: NASAL | 1 refills | Status: DC

## 2018-06-29 MED ORDER — ALBUTEROL SULFATE HFA 108 (90 BASE) MCG/ACT IN AERS: INHALATION_SPRAY | RESPIRATORY_TRACT | 1 refills | Status: DC

## 2018-06-29 NOTE — Patient Instructions (Addendum)
  1. Continue to Perform Allergen avoidance measures  2. Continue to Treat and prevent inflammation:   A. Breo 200 - one inhalation 1 time per day.    B. Flonase - 1-2 sprays each nostril one time per day  3. If needed:   A. Proventil or Ventolin HFA 2 puffs every 4-6 hours  B. OTC antihistamine - Claritin/Zyrtec/Allegra  C. nasal ipratropium 0.06% 2 sprays each nostril every 6 hours  D. Patanol - 1 drop each eye 1-2 times per day  4. "Action plan" for asthma flare up:   A. continue Breo one time a day  B. add Flovent 110 3 inhalations 3 times a day with spacer  C. use Proventil or Ventolin HFA if needed  5. Continue treatment for reflux with Protonix 40 mg daily  6. Return to clinic in 6 months or earlier if problem

## 2018-06-29 NOTE — Progress Notes (Signed)
Follow-up Note  Referring Provider: Gordy SaversKwiatkowski, Peter F, MD Primary Provider: Shirline FreesNafziger, Cory, NP Date of Office Visit: 06/29/2018  Subjective:   Richard Ho (DOB: 18-Mar-1941) is a 78 y.o. male [facility] in re-evaluation of the following:  HPI: Richard Ho returns to this clinic in reevaluation of his asthma and allergic rhinoconjunctivitis and history of reflux.  Have not seen him in this clinic since 13 Oct 2017.  Overall he has done well with his respiratory tract.  It does not sound as though he has required a systemic steroid or antibiotic to treat any type of respiratory tract issue.  His requirement for short acting bronchodilator averages out to 1 time per month.  He did attempt to discontinue his Breo and within several weeks he developed issues with chest tightness and he is now back to using this medication.  He did attempt to discontinue montelukast and does not really notice any obvious improvement when utilizing this medication.  His nose has been doing quite well.  He continues on Flonase and uses nasal ipratropium the dry up his nose.  His reflux is being treated with Protonix 40 mg daily from the [facility].  Apparently he has received approval from his nephrologist to utilize this proton pump inhibitor as an H2 receptor blocker did not result in good control of his reflux.  He did receive the flu vaccine this season.  In November 2019 he had detached retina treated with intraocular bubble.  Allergies as of 06/29/2018      Reactions   Meloxicam Other (See Comments)   "jumping out of my skin" Pt feels anxiety attacks when on this medicine      Medication List      albuterol 108 (90 Base) MCG/ACT inhaler Commonly known as:  VENTOLIN HFA Inhale two puffs every four to six hours as needed for cough or wheeze.   aspirin EC 81 MG tablet Take 81 mg by mouth daily.   b complex vitamins capsule Take 1 capsule by mouth  daily.   CENTRUM ADULTS PO Take by mouth.   fluticasone 110 MCG/ACT inhaler Commonly known as:  FLOVENT HFA Inhale three puffs three times daily during flare-up.  Rinse, gargle, and spit after use.   fluticasone 50 MCG/ACT nasal spray Commonly known as:  FLONASE Use one to two sprays in each nostril once daily as directed.   fluticasone furoate-vilanterol 200-25 MCG/INH Aepb Commonly known as:  BREO ELLIPTA Inhale 1 puff into the lungs daily. Rinse, gargle, and spit after use.   gabapentin 300 MG capsule Commonly known as:  NEURONTIN Take 2 capsules (600 mg total) by mouth 3 (three) times daily.   glipiZIDE 2.5 MG 24 hr tablet Commonly known as:  GLUCOTROL XL Take 1 tablet (2.5 mg total) by mouth daily with breakfast.   glucose blood test strip Commonly known as:  PRECISION XTRA TEST STRIPS USE TO CHECK BLOOD SUGAR DAILY   ipratropium 0.06 % nasal spray Commonly known as:  ATROVENT Can use two sprays in each nostril every six hours if needed to dry up runny nose.   isosorbide mononitrate 30 MG 24 hr tablet Commonly known as:  IMDUR TAKE ONE TABLET BY MOUTH TWICE DAILY   levothyroxine 50 MCG tablet Commonly known as:  SYNTHROID, LEVOTHROID Take 1 tablet (50 mcg total) by mouth daily.   metFORMIN 1000 MG tablet Commonly known as:  GLUCOPHAGE Take 1 tablet (1,000 mg total) by mouth  2 (two) times daily with a meal.   metoprolol tartrate 50 MG tablet Commonly known as:  LOPRESSOR Take 1 tablet (50 mg total) by mouth 2 (two) times daily.   montelukast 10 MG tablet Commonly known as:  SINGULAIR TAKE 1 TABLET BY MOUTH ONCE DAILY   nitroGLYCERIN 0.4 MG SL tablet Commonly known as:  NITROSTAT Place 1 tablet (0.4 mg total) under the tongue every 5 (five) minutes as needed. Chest pain   nortriptyline 25 MG capsule Commonly known as:  PAMELOR TAKE ONE CAPSULE BY MOUTH AT BEDTIME   olopatadine 0.1 % ophthalmic solution Commonly known as:  PATANOL Can use one drop in  each eye one to two times daily if needed.   pantoprazole 40 MG tablet Commonly known as:  PROTONIX Take 40 mg by mouth daily.   ramipril 5 MG capsule Commonly known as:  ALTACE TAKE 1 CAPSULE BY MOUTH ONCE DAILY   ranitidine 300 MG tablet Commonly known as:  ZANTAC TAKE 1 TABLET BY MOUTH ONCE DAILY AT BEDTIME   simvastatin 80 MG tablet Commonly known as:  ZOCOR Take 40 mg by mouth at bedtime. Rx from [place]   tamsulosin 0.4 MG Caps capsule Commonly known as:  FLOMAX TAKE ONE CAPSULE BY MOUTH DAILY   traZODone 50 MG tablet Commonly known as:  DESYREL Take 0.5 tablets (25 mg total) by mouth at bedtime as needed for sleep.       Past Medical History:  Diagnosis Date  . Asthma   . Coronary artery disease 1991   a) Angioplasty LAD 1991 [place] b) MI in 1995 at  [facility] in [place], med Rx  . Diabetes mellitus   . GERD (gastroesophageal reflux disease)   . Hearing loss   . HTN (hypertension)   . Hyperlipidemia   . Myocardial infarct (HCC)   . Nephrolithiasis   . Osteopenia   . Snake bite     Past Surgical History:  Procedure Laterality Date  . ANGIOPLASTY  1991  . CARDIAC CATHETERIZATION  2005   Moderate LCx and LAD disease and severe diffuse RCA disease  . CARDIAC CATHETERIZATION  01/2012   normal left main, 50% pLAD stenosis, dLAD mild luminal irregularities, D1 30-40% stenosis at take off; subtotally occluded/severely diseased OM1, mid 80% stenosis in small OM2, long prox and mid RCA stenoses ranging from 60-95%, severely diseased PDA, distal PLSA occluded filling via L-R collateral; normal LV function; inferior lateral basal akinesis.  . CHOLECYSTECTOMY  2012  . EYE SURGERY  2010   cataract - right  . INGUINAL HERNIA REPAIR    . LEFT HEART CATHETERIZATION WITH CORONARY ANGIOGRAM N/A 01/28/2012   Procedure: LEFT HEART CATHETERIZATION WITH CORONARY ANGIOGRAM;  Surgeon: Iran Ouch, MD;  [facility]: [facility] C[facility];  Service: Cardiovascular;  Laterality: N/A;   . LEFT HEART CATHETERIZATION WITH CORONARY ANGIOGRAM N/A 03/17/2014   Procedure: LEFT HEART CATHETERIZATION WITH CORONARY ANGIOGRAM;  Surgeon: Kathleene Hazel, MD;  Location: [facility] CATH LAB;  Service: Cardiovascular;  Laterality: N/A;  . ORIF TIBIA & FIBULA FRACTURES    . PTCA    . VASECTOMY      Review of systems negative except as noted in HPI / PMHx or noted below:  Review of Systems  Constitutional: Negative.   HENT: Negative.   Eyes: Negative.   Respiratory: Negative.   Cardiovascular: Negative.   Gastrointestinal: Negative.   Genitourinary: Negative.   Musculoskeletal: Negative.   Skin: Negative.   Neurological: Negative.   Endo/Heme/Allergies:  Negative.   Psychiatric/Behavioral: Negative.      Objective:   Vitals:   06/29/18 1112  BP: 128/80  Pulse: 84  Resp: 16   Height: 5\' 11"  (180.3 cm)  Weight: 222 lb (100.7 kg)   Physical Exam Constitutional:      Appearance: He is not diaphoretic.  HENT:     Head: Normocephalic.     Comments: Bilateral hearing aid    Right Ear: Tympanic membrane, ear canal and external ear normal.     Left Ear: Tympanic membrane, ear canal and external ear normal.     Nose: Nose normal. No mucosal edema or rhinorrhea.     Mouth/Throat:     Pharynx: Uvula midline. No oropharyngeal exudate.  Eyes:     Conjunctiva/sclera: Conjunctivae normal.  Neck:     Thyroid: No thyromegaly.     Trachea: Trachea normal. No tracheal tenderness or tracheal deviation.  Cardiovascular:     Rate and Rhythm: Normal rate and regular rhythm.     Heart sounds: Normal heart sounds, S1 normal and S2 normal. No murmur.  Pulmonary:     Effort: No respiratory distress.     Breath sounds: Normal breath sounds. No stridor. No wheezing or rales.  Lymphadenopathy:     Head:     Right side of head: No tonsillar adenopathy.     Left side of head: No tonsillar adenopathy.     Cervical: No cervical adenopathy.  Skin:    Findings: No erythema or rash.      Nails: There is no clubbing.   Neurological:     Mental Status: He is alert.     Diagnostics:    Spirometry was not performed secondary to his recent detached retina.  Assessment and Plan:   1. Asthma, moderate persistent, well-controlled   2. Other allergic rhinitis   3. Gastroesophageal reflux disease, esophagitis presence not specified     1. Continue to Perform Allergen avoidance measures  2. Continue to Treat and prevent inflammation:   A. Breo 200 - one inhalation 1 time per day.    B. Flonase - 1-2 sprays each nostril one time per day  3. If needed:   A. Proventil or Ventolin HFA 2 puffs every 4-6 hours  B. OTC antihistamine - Claritin/Zyrtec/Allegra  C. nasal ipratropium 0.06% 2 sprays each nostril every 6 hours  D. Patanol - 1 drop each eye 1-2 times per day  4. "Action plan" for asthma flare up:   A. continue Breo one time a day  B. add Flovent 110 3 inhalations 3 times a day with spacer  C. use Proventil or Ventolin HFA if needed  5. Continue treatment for reflux with Protonix 40 mg daily  6. Return to clinic in 6 months or earlier if problem  Richard Ho has done very well on his current plan which includes anti-inflammatory medications for his airway and therapy directed against reflux and he will continue on this plan at this point.  We will not renew his montelukast as it does not appear that this agent has provided him much benefit.  On previous evaluation he did demonstrate hypersensitivity directed against springtime pollens and will need to see how things go as he goes through this upcoming springtime season.  Assuming he does well I will see him back in his clinic in 6 months or earlier if there is a problem.  Richard Ho , MD Allergy / Immunology [facility]

## 2018-06-30 ENCOUNTER — Encounter: Payer: Self-pay | Admitting: Allergy and Immunology

## 2018-07-12 DIAGNOSIS — M7051 Other bursitis of knee, right knee: Secondary | ICD-10-CM | POA: Diagnosis not present

## 2018-07-14 ENCOUNTER — Ambulatory Visit: Payer: Self-pay | Admitting: *Deleted

## 2018-07-14 ENCOUNTER — Ambulatory Visit: Payer: Medicare Other | Admitting: Internal Medicine

## 2018-07-14 ENCOUNTER — Encounter: Payer: Self-pay | Admitting: Internal Medicine

## 2018-07-14 VITALS — BP 110/68 | HR 128 | Temp 98.2°F | Wt 220.1 lb

## 2018-07-14 DIAGNOSIS — I10 Essential (primary) hypertension: Secondary | ICD-10-CM

## 2018-07-14 DIAGNOSIS — E1142 Type 2 diabetes mellitus with diabetic polyneuropathy: Secondary | ICD-10-CM | POA: Diagnosis not present

## 2018-07-14 DIAGNOSIS — E1121 Type 2 diabetes mellitus with diabetic nephropathy: Secondary | ICD-10-CM | POA: Diagnosis not present

## 2018-07-14 DIAGNOSIS — E785 Hyperlipidemia, unspecified: Secondary | ICD-10-CM | POA: Diagnosis not present

## 2018-07-14 DIAGNOSIS — R197 Diarrhea, unspecified: Secondary | ICD-10-CM

## 2018-07-14 LAB — POCT GLYCOSYLATED HEMOGLOBIN (HGB A1C): Hemoglobin A1C: 6.3 % — AB (ref 4.0–5.6)

## 2018-07-14 MED ORDER — PREGABALIN 75 MG PO CAPS: 75.0000 mg | ORAL_CAPSULE | Freq: Two times a day (BID) | ORAL | 3 refills | Status: DC

## 2018-07-14 MED ORDER — METOPROLOL TARTRATE 50 MG PO TABS: 50.0000 mg | ORAL_TABLET | Freq: Two times a day (BID) | ORAL | 3 refills | Status: DC

## 2018-07-14 NOTE — Patient Instructions (Signed)
-  Great seeing you today!  -Stop gabapentin. Start Lyrica 75 mcg daily for your peripheral neuropathy.  -Great work on lowering your A1c. It is 6.3 today.  -Call us if still having diarrhea in 2 weeks.  -Stay hydrated and drink plenty of fluids.  -Schedule follow up in 3 months.

## 2018-07-14 NOTE — Progress Notes (Signed)
Established Patient Office Visit     CC/Reason for Visit: Follow-up diabetes, diarrhea x2 days  HPI: Richard Ho is a 78 y.o. male who is coming in today for the above mentioned reasons.  Due for wellness visit in August 2020.  Past Medical History is significant for: DM II complicated with peripheral neuropathy, HTN, HLD, hypothyroidism, CKD stage III,CAD followed by cardiology which is stable.    In November he was diagnosed with a left retinal detachment and had emergency surgery.  He has been doing well postoperatively.  Still some issues with peripheral vision and depth perception but overall doing okay.  Last visit his A1c had increased to 7.5, his metformin was increased from 500 to 1000 mg twice daily.  He has coupled this with lifestyle modifications, A1c has decreased to 6.3 today.  He noticed for the past 2 days diarrhea and increased flatus.  No abdominal pain, nausea, vomiting.  No sick contacts, recent travel, has not noticed blood in his stool.  Yesterday while cooking at the stove he felt his legs shaking, he checked his sugar and it was 123.  He has not had any low blood sugars recently.  He gets his medications through the [place], he contacted the [place] 3 weeks ago because he was running low on the metoprolol.  There was a delay in the refill so he cut it down from 50 mg twice daily to once daily and ran out today.  Blood pressure today is normal.   Past Medical/Surgical History: Past Medical History:  Diagnosis Date  . Asthma   . Coronary artery disease 1991   a) Angioplasty LAD 1991 [place] b) MI in 1995 at  [facility] in [place], med Rx  . Diabetes mellitus   . GERD (gastroesophageal reflux disease)   . Hearing loss   . HTN (hypertension)   . Hyperlipidemia   . Myocardial infarct (HCC)   . Nephrolithiasis   . Osteopenia   . Snake bite     Past Surgical History:  Procedure Laterality Date  . ANGIOPLASTY  1991  . CARDIAC CATHETERIZATION  2005   Moderate LCx and LAD disease and severe diffuse RCA disease  . CARDIAC CATHETERIZATION  01/2012   normal left main, 50% pLAD stenosis, dLAD mild luminal irregularities, D1 30-40% stenosis at take off; subtotally occluded/severely diseased OM1, mid 80% stenosis in small OM2, long prox and mid RCA stenoses ranging from 60-95%, severely diseased PDA, distal PLSA occluded filling via L-R collateral; normal LV function; inferior lateral basal akinesis.  . CHOLECYSTECTOMY  2012  . EYE SURGERY  2010   cataract - right  . INGUINAL HERNIA REPAIR    . LEFT HEART CATHETERIZATION WITH CORONARY ANGIOGRAM N/A 01/28/2012   Procedure: LEFT HEART CATHETERIZATION WITH CORONARY ANGIOGRAM;  Surgeon: Iran Ouch, MD;  Location: [facility] CATH [facility];  Service: Cardiovascular;  Laterality: N/A;  . LEFT HEART CATHETERIZATION WITH CORONARY ANGIOGRAM N/A 03/17/2014   Procedure: LEFT HEART CATHETERIZATION WITH CORONARY ANGIOGRAM;  Surgeon: Kathleene Hazel, MD;  Location: [facility] CATH LAB;  Service: Cardiovascular;  Laterality: N/A;  . ORIF TIBIA & FIBULA FRACTURES    . PTCA    . VASECTOMY      Social History:  reports that he has never smoked. He has never used smokeless tobacco. He reports current alcohol use. He reports that he does not use drugs.  Allergies: Allergies  Allergen Reactions  . Meloxicam Other (See Comments)    "jumping out  of my skin" Pt feels anxiety attacks when on this medicine    Family History:  Family History  Problem Relation Age of Onset  . Asthma Mother   . Allergies Sister   . Coronary artery disease Other      Current Outpatient Medications:  .  albuterol (VENTOLIN HFA) 108 (90 Base) MCG/ACT inhaler, Inhale two puffs every four to six hours as needed for cough or wheeze., Disp: 1 Inhaler, Rfl: 1 .  aspirin EC 81 MG tablet, Take 81 mg by mouth daily., Disp: , Rfl:  .  b complex vitamins capsule, Take 1 capsule by mouth daily., Disp: , Rfl:  .  fluticasone (FLONASE) 50 MCG/ACT nasal  spray, Use one to two sprays in each nostril once daily as directed., Disp: 48 g, Rfl: 1 .  fluticasone (FLOVENT HFA) 110 MCG/ACT inhaler, Inhale three puffs three times daily during flare-up.  Rinse, gargle, and spit after use., Disp: 3 Inhaler, Rfl: 1 .  fluticasone furoate-vilanterol (BREO ELLIPTA) 200-25 MCG/INH AEPB, Inhale 1 puff into the lungs daily. Rinse, gargle, and spit after use., Disp: 180 each, Rfl: 1 .  glipiZIDE (GLUCOTROL XL) 2.5 MG 24 hr tablet, Take 1 tablet (2.5 mg total) by mouth daily with breakfast., Disp: 90 tablet, Rfl: 3 .  glucose blood (PRECISION XTRA TEST STRIPS) test strip, USE TO CHECK BLOOD SUGAR DAILY, Disp: 100 each, Rfl: 3 .  ipratropium (ATROVENT) 0.06 % nasal spray, Can use two sprays in each nostril every six hours if needed to dry up runny nose., Disp: 45 mL, Rfl: 1 .  isosorbide mononitrate (IMDUR) 30 MG 24 hr tablet, TAKE ONE TABLET BY MOUTH TWICE DAILY, Disp: 180 tablet, Rfl: 3 .  levothyroxine (SYNTHROID, LEVOTHROID) 50 MCG tablet, Take 1 tablet (50 mcg total) by mouth daily., Disp: 90 tablet, Rfl: 3 .  metFORMIN (GLUCOPHAGE) 1000 MG tablet, Take 1 tablet (1,000 mg total) by mouth 2 (two) times daily with a meal., Disp: 60 tablet, Rfl: 3 .  metoprolol tartrate (LOPRESSOR) 50 MG tablet, Take 1 tablet (50 mg total) by mouth 2 (two) times daily., Disp: 180 tablet, Rfl: 3 .  montelukast (SINGULAIR) 10 MG tablet, Take 1 tablet (10 mg total) by mouth daily., Disp: 90 tablet, Rfl: 1 .  Multiple Vitamins-Minerals (CENTRUM ADULTS PO), Take by mouth., Disp: , Rfl:  .  nitroGLYCERIN (NITROSTAT) 0.4 MG SL tablet, Place 1 tablet (0.4 mg total) under the tongue every 5 (five) minutes as needed. Chest pain, Disp: 25 tablet, Rfl: 3 .  nortriptyline (PAMELOR) 25 MG capsule, TAKE ONE CAPSULE BY MOUTH AT BEDTIME, Disp: 90 capsule, Rfl: 0 .  olopatadine (PATANOL) 0.1 % ophthalmic solution, Can use one drop in each eye one to two times daily if needed., Disp: 5 mL, Rfl: 5 .   pantoprazole (PROTONIX) 40 MG tablet, Take 40 mg by mouth daily., Disp: , Rfl:  .  ramipril (ALTACE) 5 MG capsule, TAKE 1 CAPSULE BY MOUTH ONCE DAILY, Disp: 90 capsule, Rfl: 1 .  ranitidine (ZANTAC) 300 MG tablet, TAKE 1 TABLET BY MOUTH ONCE DAILY AT BEDTIME, Disp: 30 tablet, Rfl: 3 .  simvastatin (ZOCOR) 80 MG tablet, Take 40 mg by mouth at bedtime. Rx from [facility], Disp: , Rfl:  .  tamsulosin (FLOMAX) 0.4 MG CAPS capsule, TAKE ONE CAPSULE BY MOUTH DAILY, Disp: 30 capsule, Rfl: 5 .  traZODone (DESYREL) 50 MG tablet, Take 0.5 tablets (25 mg total) by mouth at bedtime as needed for sleep., Disp: 90 tablet, Rfl:  2 .  pregabalin (LYRICA) 75 MG capsule, Take 1 capsule (75 mg total) by mouth 2 (two) times daily., Disp: 30 capsule, Rfl: 3  Review of Systems:  Constitutional: Denies fever, chills, diaphoresis, appetite change and fatigue.  HEENT: Denies photophobia, eye pain, redness, hearing loss, ear pain, congestion, sore throat, rhinorrhea, sneezing, mouth sores, trouble swallowing, neck pain, neck stiffness and tinnitus.   Respiratory: Denies SOB, DOE, cough, chest tightness,  and wheezing.   Cardiovascular: Denies chest pain, palpitations and leg swelling.  Gastrointestinal: Denies nausea, vomiting, abdominal pain,  constipation, blood in stool and abdominal distention.  Genitourinary: Denies dysuria, urgency, frequency, hematuria, flank pain and difficulty urinating.  Endocrine: Denies: hot or cold intolerance, sweats, changes in hair or nails, polyuria, polydipsia. Musculoskeletal: Denies myalgias, back pain, joint swelling, arthralgias and gait problem.  Skin: Denies pallor, rash and wound.  Neurological: Denies dizziness, seizures, syncope, weakness, light-headedness, numbness and headaches.  Hematological: Denies adenopathy. Easy bruising, personal or family bleeding history  Psychiatric/Behavioral: Denies suicidal ideation, mood changes, confusion, nervousness, sleep disturbance and  agitation    Physical Exam: Vitals:   07/14/18 0929  BP: 110/68  Pulse: (!) 128  Temp: 98.2 F (36.8 C)  TempSrc: Oral  SpO2: 95%  Weight: 220 lb 1.6 oz (99.8 kg)    Body mass index is 30.7 kg/m.   Constitutional: NAD, calm, comfortable Eyes: PERRL, lids and conjunctivae normal ENMT: Mucous membranes are moist. Posterior pharynx clear of any exudate or lesions. Normal dentition. Tympanic membrane is pearly white, no erythema or bulging. Neck: normal, supple, no masses, no thyromegaly Respiratory: clear to auscultation bilaterally, no wheezing, no crackles. Normal respiratory effort. No accessory muscle use.  Cardiovascular: Regular rate and rhythm, no murmurs / rubs / gallops. No extremity edema. 2+ pedal pulses. No carotid bruits.  Abdomen: no tenderness, no masses palpated. No hepatosplenomegaly. Bowel sounds positive.  Musculoskeletal: no clubbing / cyanosis. No joint deformity upper and lower extremities. Good ROM, no contractures. Normal muscle tone.  Skin: no rashes, lesions, ulcers. No induration Neurologic: CN 2-12 grossly intact. Sensation intact, DTR normal. Strength 5/5 in all 4.  Psychiatric: Normal judgment and insight. Alert and oriented x 3. Normal mood.    Impression and Plan:  Controlled type 2 diabetes mellitus with diabetic nephropathy, without long-term current use of insulin (HCC)  -A1c down to 6.3 from 7.53 months ago. -Continue increased metformin at 1000 mg twice daily, doubt metformin is because of diarrhea given timing does not match. -Have continued to enforce lifestyle modifications.  Dyslipidemia -Last LDL was 64 in August 2019 -He is on simvastatin 40 mg at bedtime.  Essential hypertension  -Well controlled. -Have refilled metoprolol for 1 month until [place] meds come in.  Diabetic peripheral neuropathy (HCC)  -Not responding well to gabapentin. -He has a significant lack of feeling of his feet that makes driving difficult, he knows that he  will have to quit driving soon. -He agrees to try Lyrica 75 mcg daily in substitution of gabapentin.  Diarrhea, unspecified type -Unclear etiology, unlikely metformin as dose was increased back in November and symptoms restarted the past 2 days. -Likely some viral gastroenteritis and should self resolve.  He has been asked to notify me if no improvement within the next 2 weeks.    Patient Instructions  -Great seeing you today!  -Stop gabapentin. Start Lyrica 75 mcg daily for your peripheral neuropathy.  -Great work on lowering your A1c. It is 6.3 today.  -Call us if still having diarrhea  in 2 weeks.  -Stay hydrated and drink plenty of fluids.  -Schedule follow up in 3 months.     Chaya JanEstela Hernandez Acosta, MD [facility]

## 2018-07-14 NOTE — Telephone Encounter (Signed)
Pt stated that he is having a multiple issues.  States "I'm a mess".  He  has had frequent urination, no blood in urine not pain and the urine is clear and pale. He also stated that had diarrhea yesterday, took imodium and it is resolved. Stated last Thursday his b/p was 108/64 and his HR was 138. He was having some weakness in his legs at the time. He did not go to the ED or UC. He denies having a fever, chest pain, or headache. His b/p this morning is 167/90 HR 128.  And 148/85 HR 123. He stated he had to cut his b/p medication in half instead of taking it twice a day he has been taking it once a day. He gets his medications from the [place] and they have been late getting his meds to him. Has not taking any b/p meds today. He is completely out. He states loss of appetite. Now everything is just playing on his mind.  Home care advice given to patient. Advised to let the office know when his medication is going to be late getting to him. Call when he starts having symptoms with low b/p and increased HR.  Appointment scheduled for today. Pt voiced understanding.  Reason for Disposition . [1] Systolic BP  >= 130 OR Diastolic >= 80 AND [2] history of heart problems, kidney disease or diabetes  Answer Assessment - Initial Assessment Questions 1. BLOOD PRESSURE: "What is the blood pressure?" "Did you take at least two measurements 5 minutes apart?"     167/90 HR 128   And 148/85  HR 123 2. ONSET: "When did you take your blood pressure?"     This morning 3. HOW: "How did you obtain the blood pressure?" (e.g., visiting nurse, automatic home BP monitor)     Automatic home BP monitor 4. HISTORY: "Do you have a history of high blood pressure?"     yes 5. MEDICATIONS: "Are you taking any medications for blood pressure?" "Have you missed any doses recently?"     Has b/p medications but not taken today (out of meds) 6. OTHER SYMPTOMS: "Do you have any symptoms?" (e.g., headache, chest pain, blurred vision,  difficulty breathing, weakness)     Shortness of breath on exertion (goes away when he sits down), some weakness in legs at times 7. PREGNANCY: "Is there any chance you are pregnant?" "When was your last menstrual period?"     n/a  Protocols used: HIGH BLOOD PRESSURE-A-AH

## 2018-07-14 NOTE — Telephone Encounter (Signed)
Noted  

## 2018-07-21 ENCOUNTER — Other Ambulatory Visit: Payer: Self-pay | Admitting: *Deleted

## 2018-07-21 DIAGNOSIS — E1121 Type 2 diabetes mellitus with diabetic nephropathy: Secondary | ICD-10-CM

## 2018-07-21 MED ORDER — METFORMIN HCL 1000 MG PO TABS: 1000.0000 mg | ORAL_TABLET | Freq: Two times a day (BID) | ORAL | 1 refills | Status: DC

## 2018-07-29 ENCOUNTER — Other Ambulatory Visit: Payer: Self-pay | Admitting: Internal Medicine

## 2018-07-29 DIAGNOSIS — E1142 Type 2 diabetes mellitus with diabetic polyneuropathy: Secondary | ICD-10-CM

## 2018-07-29 MED ORDER — PREGABALIN 75 MG PO CAPS: 75.0000 mg | ORAL_CAPSULE | Freq: Two times a day (BID) | ORAL | 0 refills | Status: DC

## 2018-07-29 NOTE — Telephone Encounter (Signed)
Copied from CRM [id number]. Topic: Quick Communication - See Telephone Encounter >> Jul 29, 2018  2:50 PM Terisa Starr wrote: CRM for notification. See Telephone encounter for: 07/29/18.  Patient states his script for pregabalin (LYRICA) 75 MG capsule is only giving him enough for 15 days because he is taking it twice a day, he would like to know could he have a new script for 90 days with 180 capsules. Please advise. He just picked up a script but only has enough for 15 days.   [facility] [address] [place] - [address] [place] [address]

## 2018-08-02 LAB — LIPID PANEL
Cholesterol: 153 (ref 0–200)
HDL: 47 (ref 35–70)
LDL Cholesterol: 71
Triglycerides: 175 — AB (ref 40–160)

## 2018-08-02 LAB — HEMOGLOBIN A1C: Hemoglobin A1C: 7.2

## 2018-08-02 LAB — BASIC METABOLIC PANEL
Creatinine: 1.9 — AB (ref <=1.3)
Potassium: 5.7 — AB (ref 3.4–5.3)
Sodium: 137 (ref 137–147)

## 2018-08-02 LAB — VITAMIN B12: Vitamin B-12: 299

## 2018-08-02 LAB — TSH: TSH: 4.39 (ref 0.41–5.90)

## 2018-08-02 LAB — MICROALBUMIN, URINE: MICROALB UR: 1.58

## 2018-08-03 ENCOUNTER — Ambulatory Visit: Payer: Medicare Other | Admitting: Internal Medicine

## 2018-08-10 ENCOUNTER — Encounter: Payer: Self-pay | Admitting: Internal Medicine

## 2018-08-12 ENCOUNTER — Other Ambulatory Visit: Payer: Self-pay | Admitting: Internal Medicine

## 2018-08-16 ENCOUNTER — Telehealth: Payer: Self-pay | Admitting: Internal Medicine

## 2018-08-16 DIAGNOSIS — E875 Hyperkalemia: Secondary | ICD-10-CM

## 2018-08-16 NOTE — Telephone Encounter (Signed)
Copied from CRM [id number]. Topic: Quick Communication - Rx Refill/Question >> Aug 16, 2018  2:49 PM Jaquita Rector A wrote: Medication: ramipril (ALTACE) 5 MG capsule   Per pharmacy need Rx from Dr Ardyth Harps  Has the patient contacted their pharmacy? Yes.   (Agent: If no, request that the patient contact the pharmacy for the refill.) (Agent: If yes, when and what did the pharmacy advise?)  Preferred Pharmacy (with phone number or street name): [facility] [address] [place], [place] - [address] [phone] (Phone) [phone] (Fax)    Agent: Please be advised that RX refills may take up to 3 business days. We ask that you follow-up with your pharmacy.

## 2018-08-17 DIAGNOSIS — H31092 Other chorioretinal scars, left eye: Secondary | ICD-10-CM | POA: Diagnosis not present

## 2018-08-17 DIAGNOSIS — H43391 Other vitreous opacities, right eye: Secondary | ICD-10-CM | POA: Diagnosis not present

## 2018-08-17 DIAGNOSIS — H35371 Puckering of macula, right eye: Secondary | ICD-10-CM | POA: Diagnosis not present

## 2018-08-17 DIAGNOSIS — H43811 Vitreous degeneration, right eye: Secondary | ICD-10-CM | POA: Diagnosis not present

## 2018-08-17 NOTE — Telephone Encounter (Signed)
Potassium was high. Let's hold off on refilling this. Please have him return for a Stat BMET tomorrow if possible. Ask him to stay hydrated and drink plenty of fluids.

## 2018-08-18 ENCOUNTER — Other Ambulatory Visit: Payer: Self-pay

## 2018-08-18 ENCOUNTER — Other Ambulatory Visit (INDEPENDENT_AMBULATORY_CARE_PROVIDER_SITE_OTHER): Payer: Medicare Other

## 2018-08-18 DIAGNOSIS — E875 Hyperkalemia: Secondary | ICD-10-CM

## 2018-08-18 LAB — BASIC METABOLIC PANEL
BUN: 24 mg/dL — ABNORMAL HIGH (ref 6–23)
CO2: 26 mEq/L (ref 19–32)
Calcium: 9.6 mg/dL (ref 8.4–10.5)
Chloride: 102 mEq/L (ref 96–112)
Creatinine, Ser: 1.45 mg/dL (ref 0.40–1.50)
GFR: 47.12 mL/min — ABNORMAL LOW (ref >60.00)
Glucose, Bld: 182 mg/dL — ABNORMAL HIGH (ref 70–99)
Potassium: 4.7 mEq/L (ref 3.5–5.1)
Sodium: 135 mEq/L (ref 135–145)

## 2018-08-18 NOTE — Telephone Encounter (Signed)
I called the pt and informed him of the message below.  Lab appt scheduled for today at 10:30am.

## 2018-08-24 DIAGNOSIS — H5203 Hypermetropia, bilateral: Secondary | ICD-10-CM | POA: Diagnosis not present

## 2018-08-24 LAB — HM DIABETES EYE EXAM

## 2018-08-24 NOTE — Telephone Encounter (Signed)
Pt is calling and still waiting on altace

## 2018-08-25 ENCOUNTER — Encounter: Payer: Self-pay | Admitting: Internal Medicine

## 2018-08-25 ENCOUNTER — Other Ambulatory Visit: Payer: Self-pay | Admitting: *Deleted

## 2018-08-25 MED ORDER — RAMIPRIL 5 MG PO CAPS: 5.0000 mg | ORAL_CAPSULE | Freq: Every day | ORAL | 1 refills | Status: DC

## 2018-08-26 ENCOUNTER — Encounter: Payer: Self-pay | Admitting: Internal Medicine

## 2018-08-27 ENCOUNTER — Encounter: Payer: Self-pay | Admitting: Internal Medicine

## 2018-10-12 ENCOUNTER — Other Ambulatory Visit: Payer: Self-pay

## 2018-10-12 ENCOUNTER — Ambulatory Visit: Payer: Medicare Other | Admitting: Adult Health

## 2018-10-12 ENCOUNTER — Ambulatory Visit (INDEPENDENT_AMBULATORY_CARE_PROVIDER_SITE_OTHER): Payer: Medicare Other | Admitting: Internal Medicine

## 2018-10-12 DIAGNOSIS — E039 Hypothyroidism, unspecified: Secondary | ICD-10-CM

## 2018-10-12 DIAGNOSIS — R252 Cramp and spasm: Secondary | ICD-10-CM

## 2018-10-12 DIAGNOSIS — I10 Essential (primary) hypertension: Secondary | ICD-10-CM

## 2018-10-12 DIAGNOSIS — E1121 Type 2 diabetes mellitus with diabetic nephropathy: Secondary | ICD-10-CM

## 2018-10-12 DIAGNOSIS — H3322 Serous retinal detachment, left eye: Secondary | ICD-10-CM | POA: Diagnosis not present

## 2018-10-12 DIAGNOSIS — I2510 Atherosclerotic heart disease of native coronary artery without angina pectoris: Secondary | ICD-10-CM

## 2018-10-12 DIAGNOSIS — J302 Other seasonal allergic rhinitis: Secondary | ICD-10-CM

## 2018-10-12 NOTE — Progress Notes (Signed)
Virtual Visit via Video Note  I connected with [name] on 10/12/18 at 11:30 AM EDT by a video enabled telemedicine application and verified that I am speaking with the correct person using two identifiers.  Location patient: home Location provider: work office Persons participating in the virtual visit: patient, provider  I discussed the limitations of evaluation and management by telemedicine and the availability of in person appointments. The patient expressed understanding and agreed to proceed.   HPI: This is a scheduled visit to discuss his chronic medical issues.  He also has some acute issues that he would like to discuss.  Past medical history is significant for:  1.  Type 2 diabetes, his last A1c was 7.2 in February, he states his CBGs at home range between 107 and 122, he had one low last week at 37 but that is because he was doing yard work and did not eat.  2.  Hypertension this has been well controlled at home.  3.  Diabetic neuropathy, pain is improved since starting Lyrica last visit, however he has had increasing numbness and issues with balance.  4.  Hypothyroidism/TSH was 4.390 in February, he continues on Synthroid.  5.  Asthma this appears to be moderately well controlled on ipratropium and as needed albuterol, he tells me he has been seeing an allergist.  6.  Left retinal detachment in 2019, followed by ophthalmology, still has some visual floaters but he is overall happy with the results of his surgery.  7.  Coronary artery disease this has been stable and asymptomatic, he follows routinely with cardiology.  8.  Hyperlipidemia, last LDL was 71 in February he is on a statin.  He has a couple acute complaints that he would like to discuss:  1.  Leg cramps that have been present for 3 to 4 weeks these are most prominent over his calves and thighs been mostly the left side, this happens only at nighttime while he is sleeping.  2.  He has noticed  increase in postnasal drip, coughing and hoarseness of voice.   ROS: Constitutional: Denies fever, chills, diaphoresis, appetite change and fatigue.  HEENT: Denies photophobia, eye pain, redness, hearing loss, ear pain, mouth sores, trouble swallowing, neck pain, neck stiffness and tinnitus.   Respiratory: Denies SOB, DOE, cough, chest tightness,  and wheezing.   Cardiovascular: Denies chest pain, palpitations and leg swelling.  Gastrointestinal: Denies nausea, vomiting, abdominal pain, diarrhea, constipation, blood in stool and abdominal distention.  Genitourinary: Denies dysuria, urgency, frequency, hematuria, flank pain and difficulty urinating.  Endocrine: Denies: hot or cold intolerance, sweats, changes in hair or nails, polyuria, polydipsia. Musculoskeletal: Denies myalgias, back pain, joint swelling, arthralgias and gait problem.  Skin: Denies pallor, rash and wound.  Neurological: Denies dizziness, seizures, syncope, weakness, light-headedness, numbness and headaches.  Hematological: Denies adenopathy. Easy bruising, personal or family bleeding history  Psychiatric/Behavioral: Denies suicidal ideation, mood changes, confusion, nervousness, sleep disturbance and agitation   Past Medical History:  Diagnosis Date  . Asthma   . Coronary artery disease 1991   a) Angioplasty LAD 1991 [place] b) MI in 1995 at  [facility] in [place], med Rx  . Diabetes mellitus   . GERD (gastroesophageal reflux disease)   . Hearing loss   . HTN (hypertension)   . Hyperlipidemia   . Myocardial infarct (HCC)   . Nephrolithiasis   . Osteopenia   . Snake bite     Past Surgical History:  Procedure Laterality Date  .  ANGIOPLASTY  1991  . CARDIAC CATHETERIZATION  2005   Moderate LCx and LAD disease and severe diffuse RCA disease  . CARDIAC CATHETERIZATION  01/2012   normal left main, 50% pLAD stenosis, dLAD mild luminal irregularities, D1 30-40% stenosis at take off; subtotally  occluded/severely diseased OM1, mid 80% stenosis in small OM2, long prox and mid RCA stenoses ranging from 60-95%, severely diseased PDA, distal PLSA occluded filling via L-R collateral; normal LV function; inferior lateral basal akinesis.  . CHOLECYSTECTOMY  2012  . EYE SURGERY  2010   cataract - right  . INGUINAL HERNIA REPAIR    . LEFT HEART CATHETERIZATION WITH CORONARY ANGIOGRAM N/A 01/28/2012   Procedure: LEFT HEART CATHETERIZATION WITH CORONARY ANGIOGRAM;  Surgeon: Iran OuchMuhammad A Arida, MD;  Location: [facility] CATH LAB;  Service: Cardiovascular;  Laterality: N/A;  . LEFT HEART CATHETERIZATION WITH CORONARY ANGIOGRAM N/A 03/17/2014   Procedure: LEFT HEART CATHETERIZATION WITH CORONARY ANGIOGRAM;  Surgeon: Kathleene Hazelhristopher D McAlhany, MD;  [facility];  Service: Cardiovascular;  Laterality: N/A;  . ORIF TIBIA & FIBULA FRACTURES    . PTCA    . VASECTOMY      Family History  Problem Relation Age of Onset  . Asthma Mother   . Allergies Sister   . Coronary artery disease Other     SOCIAL HX:   reports that he has never smoked. He has never used smokeless tobacco. He reports current alcohol use. He reports that he does not use drugs.   Current Outpatient Medications:  .  albuterol (VENTOLIN HFA) 108 (90 Base) MCG/ACT inhaler, Inhale two puffs every four to six hours as needed for cough or wheeze., Disp: 1 Inhaler, Rfl: 1 .  aspirin EC 81 MG tablet, Take 81 mg by mouth daily., Disp: , Rfl:  .  b complex vitamins capsule, Take 1 capsule by mouth daily., Disp: , Rfl:  .  fluticasone (FLONASE) 50 MCG/ACT nasal spray, Use one to two sprays in each nostril once daily as directed., Disp: 48 g, Rfl: 1 .  fluticasone (FLOVENT HFA) 110 MCG/ACT inhaler, Inhale three puffs three times daily during flare-up.  Rinse, gargle, and spit after use., Disp: 3 Inhaler, Rfl: 1 .  fluticasone furoate-vilanterol (BREO ELLIPTA) 200-25 MCG/INH AEPB, Inhale 1 puff into the lungs daily. Rinse, gargle, and spit after  use., Disp: 180 each, Rfl: 1 .  glipiZIDE (GLUCOTROL XL) 2.5 MG 24 hr tablet, Take 1 tablet (2.5 mg total) by mouth daily with breakfast., Disp: 90 tablet, Rfl: 3 .  glucose blood (PRECISION XTRA TEST STRIPS) test strip, USE TO CHECK BLOOD SUGAR DAILY, Disp: 100 each, Rfl: 3 .  ipratropium (ATROVENT) 0.06 % nasal spray, Can use two sprays in each nostril every six hours if needed to dry up runny nose., Disp: 45 mL, Rfl: 1 .  isosorbide mononitrate (IMDUR) 30 MG 24 hr tablet, TAKE ONE TABLET BY MOUTH TWICE DAILY, Disp: 180 tablet, Rfl: 3 .  levothyroxine (SYNTHROID, LEVOTHROID) 50 MCG tablet, Take 1 tablet (50 mcg total) by mouth daily., Disp: 90 tablet, Rfl: 3 .  metFORMIN (GLUCOPHAGE) 1000 MG tablet, Take 1 tablet (1,000 mg total) by mouth 2 (two) times daily with a meal., Disp: 180 tablet, Rfl: 1 .  metoprolol tartrate (LOPRESSOR) 50 MG tablet, Take 1 tablet (50 mg total) by mouth 2 (two) times daily., Disp: 180 tablet, Rfl: 3 .  montelukast (SINGULAIR) 10 MG tablet, Take 1 tablet (10 mg total) by mouth daily., Disp: 90 tablet, Rfl: 1 .  Multiple Vitamins-Minerals (CENTRUM ADULTS PO), Take by mouth., Disp: , Rfl:  .  nitroGLYCERIN (NITROSTAT) 0.4 MG SL tablet, Place 1 tablet (0.4 mg total) under the tongue every 5 (five) minutes as needed. Chest pain, Disp: 25 tablet, Rfl: 3 .  nortriptyline (PAMELOR) 25 MG capsule, TAKE ONE CAPSULE BY MOUTH AT BEDTIME, Disp: 90 capsule, Rfl: 0 .  olopatadine (PATANOL) 0.1 % ophthalmic solution, Can use one drop in each eye one to two times daily if needed., Disp: 5 mL, Rfl: 5 .  pantoprazole (PROTONIX) 40 MG tablet, Take 40 mg by mouth daily., Disp: , Rfl:  .  pregabalin (LYRICA) 75 MG capsule, Take 1 capsule (75 mg total) by mouth 2 (two) times daily., Disp: 180 capsule, Rfl: 0 .  ramipril (ALTACE) 5 MG capsule, Take 1 capsule (5 mg total) by mouth daily., Disp: 90 capsule, Rfl: 1 .  ranitidine (ZANTAC) 300 MG tablet, TAKE 1 TABLET BY MOUTH ONCE DAILY AT BEDTIME,  Disp: 30 tablet, Rfl: 3 .  simvastatin (ZOCOR) 80 MG tablet, Take 40 mg by mouth at bedtime. Rx from [place], Disp: , Rfl:  .  tamsulosin (FLOMAX) 0.4 MG CAPS capsule, TAKE ONE CAPSULE BY MOUTH DAILY, Disp: 30 capsule, Rfl: 5 .  traZODone (DESYREL) 50 MG tablet, Take 0.5 tablets (25 mg total) by mouth at bedtime as needed for sleep., Disp: 90 tablet, Rfl: 2  EXAM:   VITALS per patient if applicable: Vitals taken today show blood pressure 129/77, heart rate 86, temperature of 97.3, weight of 219.8, his CBG this morning was 122  GENERAL: alert, oriented, appears well and in no acute distress  HEENT: atraumatic, conjunttiva clear, no obvious abnormalities on inspection of external nose and ears, wears corrective lenses  NECK: normal movements of the head and neck  LUNGS: on inspection no signs of respiratory distress, breathing rate appears normal, no obvious gross increased work of breathing, gasping or wheezing  CV: no obvious cyanosis  MS: moves all visible extremities without noticeable abnormality  PSYCH/NEURO: pleasant and cooperative, no obvious depression or anxiety, speech and thought processing grossly intact  ASSESSMENT AND PLAN:   Essential hypertension -Appears well-controlled on current regimen.  Left retinal detachment -Follows routinely with ophthalmology.  Type 2 diabetes mellitus with diabetic nephropathy, without long-term current use of insulin (HCC) -CBGs appear to be fairly well controlled, continue current regimen.  Acquired hypothyroidism -Continue current dose of Synthroid  Atherosclerosis of native coronary artery of native heart without angina pectoris -No chest pain, shortness of breath, follow-up with cardiology  Leg cramps  -Check electrolytes to rule out deficiencies, patient will return for basic metabolic panel and magnesium levels.  Seasonal allergies -Have recommended daily use of antihistamine that will likely help with his cough, postnasal  drip and raspy voice.     I discussed the assessment and treatment plan with the patient. The patient was provided an opportunity to ask questions and all were answered. The patient agreed with the plan and demonstrated an understanding of the instructions.   The patient was advised to call back or seek an in-person evaluation if the symptoms worsen or if the condition fails to improve as anticipated.    Chaya Jan, MD  [facility]

## 2018-10-15 ENCOUNTER — Other Ambulatory Visit (INDEPENDENT_AMBULATORY_CARE_PROVIDER_SITE_OTHER): Payer: Medicare Other

## 2018-10-15 ENCOUNTER — Other Ambulatory Visit: Payer: Self-pay

## 2018-10-15 DIAGNOSIS — R252 Cramp and spasm: Secondary | ICD-10-CM | POA: Diagnosis not present

## 2018-10-15 LAB — BASIC METABOLIC PANEL
BUN: 29 mg/dL — ABNORMAL HIGH (ref 6–23)
CO2: 27 mEq/L (ref 19–32)
Calcium: 10.1 mg/dL (ref 8.4–10.5)
Chloride: 101 mEq/L (ref 96–112)
Creatinine, Ser: 1.52 mg/dL — ABNORMAL HIGH (ref 0.40–1.50)
GFR: 44.61 mL/min — ABNORMAL LOW (ref >60.00)
Glucose, Bld: 134 mg/dL — ABNORMAL HIGH (ref 70–99)
Potassium: 5.4 mEq/L — ABNORMAL HIGH (ref 3.5–5.1)
Sodium: 137 mEq/L (ref 135–145)

## 2018-10-15 LAB — MAGNESIUM: Magnesium: 1.5 mg/dL (ref 1.5–2.5)

## 2018-10-19 ENCOUNTER — Other Ambulatory Visit: Payer: Self-pay | Admitting: Internal Medicine

## 2018-10-19 DIAGNOSIS — R252 Cramp and spasm: Secondary | ICD-10-CM

## 2018-10-19 MED ORDER — MAGNESIUM OXIDE 400 MG PO CAPS: 400.0000 mg | ORAL_CAPSULE | Freq: Two times a day (BID) | ORAL | 0 refills | Status: AC

## 2018-10-20 ENCOUNTER — Other Ambulatory Visit: Payer: Self-pay | Admitting: Internal Medicine

## 2018-10-20 DIAGNOSIS — E875 Hyperkalemia: Secondary | ICD-10-CM

## 2018-10-26 DIAGNOSIS — M7522 Bicipital tendinitis, left shoulder: Secondary | ICD-10-CM | POA: Diagnosis not present

## 2018-11-03 ENCOUNTER — Other Ambulatory Visit: Payer: Self-pay | Admitting: Internal Medicine

## 2018-11-03 ENCOUNTER — Encounter: Payer: Self-pay | Admitting: Internal Medicine

## 2018-11-03 ENCOUNTER — Other Ambulatory Visit (INDEPENDENT_AMBULATORY_CARE_PROVIDER_SITE_OTHER): Payer: Medicare Other

## 2018-11-03 ENCOUNTER — Other Ambulatory Visit: Payer: Self-pay

## 2018-11-03 DIAGNOSIS — E875 Hyperkalemia: Secondary | ICD-10-CM

## 2018-11-03 LAB — BASIC METABOLIC PANEL
BUN: 36 mg/dL — ABNORMAL HIGH (ref 6–23)
CO2: 24 mEq/L (ref 19–32)
Calcium: 9.7 mg/dL (ref 8.4–10.5)
Chloride: 101 mEq/L (ref 96–112)
Creatinine, Ser: 1.67 mg/dL — ABNORMAL HIGH (ref 0.40–1.50)
GFR: 40.01 mL/min — ABNORMAL LOW (ref >60.00)
Glucose, Bld: 93 mg/dL (ref 70–99)
Potassium: 5.2 mEq/L — ABNORMAL HIGH (ref 3.5–5.1)
Sodium: 134 mEq/L — ABNORMAL LOW (ref 135–145)

## 2018-11-04 ENCOUNTER — Telehealth: Payer: Self-pay | Admitting: *Deleted

## 2018-11-04 DIAGNOSIS — E1121 Type 2 diabetes mellitus with diabetic nephropathy: Secondary | ICD-10-CM

## 2018-11-04 DIAGNOSIS — R972 Elevated prostate specific antigen [PSA]: Secondary | ICD-10-CM

## 2018-11-04 DIAGNOSIS — E875 Hyperkalemia: Secondary | ICD-10-CM

## 2018-11-04 NOTE — Telephone Encounter (Signed)
Patient has scheduled a lab appointment to follow up with hyperkalemia.  Patient also requests an a1c and psa.  His last virtual visit was 10/12/2018.  Okay to order?

## 2018-11-04 NOTE — Telephone Encounter (Signed)
Yes

## 2018-11-04 NOTE — Telephone Encounter (Signed)
Orders placed and appointment made

## 2018-11-09 DIAGNOSIS — M25512 Pain in left shoulder: Secondary | ICD-10-CM | POA: Diagnosis not present

## 2018-11-15 ENCOUNTER — Other Ambulatory Visit: Payer: Self-pay

## 2018-11-15 ENCOUNTER — Other Ambulatory Visit (INDEPENDENT_AMBULATORY_CARE_PROVIDER_SITE_OTHER): Payer: Medicare Other

## 2018-11-15 DIAGNOSIS — E1121 Type 2 diabetes mellitus with diabetic nephropathy: Secondary | ICD-10-CM | POA: Diagnosis not present

## 2018-11-15 DIAGNOSIS — R972 Elevated prostate specific antigen [PSA]: Secondary | ICD-10-CM

## 2018-11-15 DIAGNOSIS — E875 Hyperkalemia: Secondary | ICD-10-CM | POA: Diagnosis not present

## 2018-11-15 LAB — BASIC METABOLIC PANEL
BUN: 24 mg/dL — ABNORMAL HIGH (ref 6–23)
CO2: 27 mEq/L (ref 19–32)
Calcium: 10.1 mg/dL (ref 8.4–10.5)
Chloride: 103 mEq/L (ref 96–112)
Creatinine, Ser: 1.49 mg/dL (ref 0.40–1.50)
GFR: 45.64 mL/min — ABNORMAL LOW (ref >60.00)
Glucose, Bld: 98 mg/dL (ref 70–99)
Potassium: 5.4 mEq/L — ABNORMAL HIGH (ref 3.5–5.1)
Sodium: 138 mEq/L (ref 135–145)

## 2018-11-15 LAB — HEMOGLOBIN A1C: Hgb A1c MFr Bld: 7.6 % — ABNORMAL HIGH (ref 4.6–6.5)

## 2018-11-15 LAB — PSA: PSA: 2.09 ng/mL (ref 0.10–4.00)

## 2018-11-19 ENCOUNTER — Ambulatory Visit: Payer: Self-pay

## 2018-11-19 ENCOUNTER — Other Ambulatory Visit: Payer: Self-pay | Admitting: Internal Medicine

## 2018-11-19 DIAGNOSIS — E1121 Type 2 diabetes mellitus with diabetic nephropathy: Secondary | ICD-10-CM

## 2018-11-19 DIAGNOSIS — E875 Hyperkalemia: Secondary | ICD-10-CM

## 2018-11-19 NOTE — Telephone Encounter (Signed)
Pt returned call message by Dr Jerilee Hoh 11/18/2018 read to patient. Pt will contact his surgeon and stop Celebrex. Call transfered to office for scheduling of lab.

## 2018-11-22 DIAGNOSIS — M25512 Pain in left shoulder: Secondary | ICD-10-CM | POA: Diagnosis not present

## 2018-11-29 DIAGNOSIS — N401 Enlarged prostate with lower urinary tract symptoms: Secondary | ICD-10-CM | POA: Diagnosis not present

## 2018-11-29 DIAGNOSIS — R350 Frequency of micturition: Secondary | ICD-10-CM | POA: Diagnosis not present

## 2018-12-02 ENCOUNTER — Other Ambulatory Visit: Payer: Self-pay

## 2018-12-02 ENCOUNTER — Other Ambulatory Visit (INDEPENDENT_AMBULATORY_CARE_PROVIDER_SITE_OTHER): Payer: Medicare Other

## 2018-12-02 DIAGNOSIS — E875 Hyperkalemia: Secondary | ICD-10-CM

## 2018-12-02 DIAGNOSIS — E1121 Type 2 diabetes mellitus with diabetic nephropathy: Secondary | ICD-10-CM

## 2018-12-02 LAB — BASIC METABOLIC PANEL
BUN: 35 mg/dL — ABNORMAL HIGH (ref 6–23)
CO2: 25 mEq/L (ref 19–32)
Calcium: 9.7 mg/dL (ref 8.4–10.5)
Chloride: 101 mEq/L (ref 96–112)
Creatinine, Ser: 1.75 mg/dL — ABNORMAL HIGH (ref 0.40–1.50)
GFR: 37.9 mL/min — ABNORMAL LOW (ref >60.00)
Glucose, Bld: 93 mg/dL (ref 70–99)
Potassium: 5.2 mEq/L — ABNORMAL HIGH (ref 3.5–5.1)
Sodium: 136 mEq/L (ref 135–145)

## 2018-12-03 ENCOUNTER — Other Ambulatory Visit: Payer: Self-pay | Admitting: Internal Medicine

## 2018-12-03 DIAGNOSIS — E875 Hyperkalemia: Secondary | ICD-10-CM

## 2018-12-03 DIAGNOSIS — M25552 Pain in left hip: Secondary | ICD-10-CM | POA: Diagnosis not present

## 2018-12-03 DIAGNOSIS — M545 Low back pain: Secondary | ICD-10-CM | POA: Diagnosis not present

## 2018-12-03 DIAGNOSIS — N183 Chronic kidney disease, stage 3 unspecified: Secondary | ICD-10-CM

## 2018-12-03 MED ORDER — SODIUM POLYSTYRENE SULFONATE 15 GM/60ML PO SUSP: 15.0000 g | Freq: Every day | ORAL | 0 refills | Status: AC

## 2018-12-04 ENCOUNTER — Other Ambulatory Visit: Payer: Self-pay | Admitting: Internal Medicine

## 2018-12-04 DIAGNOSIS — E1142 Type 2 diabetes mellitus with diabetic polyneuropathy: Secondary | ICD-10-CM

## 2018-12-09 ENCOUNTER — Encounter (HOSPITAL_BASED_OUTPATIENT_CLINIC_OR_DEPARTMENT_OTHER): Payer: Self-pay | Admitting: *Deleted

## 2018-12-09 ENCOUNTER — Other Ambulatory Visit: Payer: Self-pay

## 2018-12-09 NOTE — Progress Notes (Addendum)
Bring all medications. Coming Monday for BMET and EKG. Reviewed Cardiology notes, labs with Dr. Marcell Barlow - ok for surgery, recheck BMET.

## 2018-12-13 ENCOUNTER — Encounter (HOSPITAL_BASED_OUTPATIENT_CLINIC_OR_DEPARTMENT_OTHER)
Admission: RE | Admit: 2018-12-13 | Discharge: 2018-12-13 | Disposition: A | Discharge status: Home / Self Care | Payer: Medicare Other | Source: Ambulatory Visit | Attending: Orthopaedic Surgery | Admitting: Orthopaedic Surgery

## 2018-12-13 ENCOUNTER — Other Ambulatory Visit: Payer: Self-pay

## 2018-12-13 ENCOUNTER — Other Ambulatory Visit (HOSPITAL_COMMUNITY)
Admission: RE | Admit: 2018-12-13 | Discharge: 2018-12-13 | Disposition: A | Discharge status: Home / Self Care | Payer: Medicare Other | Source: Ambulatory Visit | Attending: Orthopaedic Surgery | Admitting: Orthopaedic Surgery

## 2018-12-13 DIAGNOSIS — M7542 Impingement syndrome of left shoulder: Secondary | ICD-10-CM | POA: Insufficient documentation

## 2018-12-13 DIAGNOSIS — Z1159 Encounter for screening for other viral diseases: Secondary | ICD-10-CM | POA: Diagnosis not present

## 2018-12-13 DIAGNOSIS — Z01812 Encounter for preprocedural laboratory examination: Secondary | ICD-10-CM | POA: Insufficient documentation

## 2018-12-13 DIAGNOSIS — M75102 Unspecified rotator cuff tear or rupture of left shoulder, not specified as traumatic: Secondary | ICD-10-CM | POA: Diagnosis not present

## 2018-12-13 LAB — BASIC METABOLIC PANEL
Anion gap: 7 (ref 5–15)
BUN: 31 mg/dL — ABNORMAL HIGH (ref 8–23)
CO2: 25 mmol/L (ref 22–32)
Calcium: 10 mg/dL (ref 8.9–10.3)
Chloride: 106 mmol/L (ref 98–111)
Creatinine, Ser: 1.66 mg/dL — ABNORMAL HIGH (ref 0.61–1.24)
GFR calc Af Amer: 45 mL/min — ABNORMAL LOW (ref >60)
GFR calc non Af Amer: 39 mL/min — ABNORMAL LOW (ref >60)
Glucose, Bld: 100 mg/dL — ABNORMAL HIGH (ref 70–99)
Potassium: 5.1 mmol/L (ref 3.5–5.1)
Sodium: 138 mmol/L (ref 135–145)

## 2018-12-13 NOTE — Progress Notes (Addendum)
Gatorade 2 drink given with instructions to complete by 0500 dos, pt verbalized understanding.  EKG reviewed by Dr. Lanetta Inch, will proceed with surgery as scheduled.

## 2018-12-14 ENCOUNTER — Encounter: Payer: Self-pay | Admitting: Internal Medicine

## 2018-12-14 LAB — SARS CORONAVIRUS 2 (TAT 6-24 HRS): SARS Coronavirus 2: NEGATIVE

## 2018-12-16 ENCOUNTER — Ambulatory Visit (HOSPITAL_BASED_OUTPATIENT_CLINIC_OR_DEPARTMENT_OTHER): Payer: Medicare Other | Admitting: Anesthesiology

## 2018-12-16 ENCOUNTER — Encounter (HOSPITAL_BASED_OUTPATIENT_CLINIC_OR_DEPARTMENT_OTHER)
Admission: RE | Disposition: A | Discharge status: Home / Self Care | Payer: Self-pay | Source: Home / Self Care | Attending: Orthopaedic Surgery

## 2018-12-16 ENCOUNTER — Encounter (HOSPITAL_BASED_OUTPATIENT_CLINIC_OR_DEPARTMENT_OTHER): Payer: Self-pay

## 2018-12-16 ENCOUNTER — Other Ambulatory Visit: Payer: Self-pay

## 2018-12-16 ENCOUNTER — Ambulatory Visit (HOSPITAL_BASED_OUTPATIENT_CLINIC_OR_DEPARTMENT_OTHER)
Admission: RE | Admit: 2018-12-16 | Discharge: 2018-12-16 | Disposition: A | Discharge status: Home / Self Care | Payer: Medicare Other | Attending: Orthopaedic Surgery | Admitting: Orthopaedic Surgery

## 2018-12-16 DIAGNOSIS — E039 Hypothyroidism, unspecified: Secondary | ICD-10-CM | POA: Insufficient documentation

## 2018-12-16 DIAGNOSIS — E119 Type 2 diabetes mellitus without complications: Secondary | ICD-10-CM | POA: Diagnosis not present

## 2018-12-16 DIAGNOSIS — I2510 Atherosclerotic heart disease of native coronary artery without angina pectoris: Secondary | ICD-10-CM | POA: Insufficient documentation

## 2018-12-16 DIAGNOSIS — I10 Essential (primary) hypertension: Secondary | ICD-10-CM | POA: Diagnosis not present

## 2018-12-16 DIAGNOSIS — Z9861 Coronary angioplasty status: Secondary | ICD-10-CM | POA: Insufficient documentation

## 2018-12-16 DIAGNOSIS — Z7984 Long term (current) use of oral hypoglycemic drugs: Secondary | ICD-10-CM | POA: Diagnosis not present

## 2018-12-16 DIAGNOSIS — K219 Gastro-esophageal reflux disease without esophagitis: Secondary | ICD-10-CM | POA: Insufficient documentation

## 2018-12-16 DIAGNOSIS — S46012A Strain of muscle(s) and tendon(s) of the rotator cuff of left shoulder, initial encounter: Secondary | ICD-10-CM | POA: Diagnosis not present

## 2018-12-16 DIAGNOSIS — J45909 Unspecified asthma, uncomplicated: Secondary | ICD-10-CM | POA: Insufficient documentation

## 2018-12-16 DIAGNOSIS — Z79899 Other long term (current) drug therapy: Secondary | ICD-10-CM | POA: Diagnosis not present

## 2018-12-16 DIAGNOSIS — H9190 Unspecified hearing loss, unspecified ear: Secondary | ICD-10-CM | POA: Insufficient documentation

## 2018-12-16 DIAGNOSIS — Z79890 Hormone replacement therapy (postmenopausal): Secondary | ICD-10-CM | POA: Diagnosis not present

## 2018-12-16 DIAGNOSIS — M7542 Impingement syndrome of left shoulder: Secondary | ICD-10-CM | POA: Diagnosis not present

## 2018-12-16 DIAGNOSIS — W19XXXA Unspecified fall, initial encounter: Secondary | ICD-10-CM | POA: Diagnosis not present

## 2018-12-16 DIAGNOSIS — M75102 Unspecified rotator cuff tear or rupture of left shoulder, not specified as traumatic: Secondary | ICD-10-CM | POA: Diagnosis not present

## 2018-12-16 DIAGNOSIS — Z7982 Long term (current) use of aspirin: Secondary | ICD-10-CM | POA: Diagnosis not present

## 2018-12-16 DIAGNOSIS — I252 Old myocardial infarction: Secondary | ICD-10-CM | POA: Diagnosis not present

## 2018-12-16 DIAGNOSIS — M24112 Other articular cartilage disorders, left shoulder: Secondary | ICD-10-CM | POA: Diagnosis not present

## 2018-12-16 DIAGNOSIS — E785 Hyperlipidemia, unspecified: Secondary | ICD-10-CM | POA: Insufficient documentation

## 2018-12-16 DIAGNOSIS — G8918 Other acute postprocedural pain: Secondary | ICD-10-CM | POA: Diagnosis not present

## 2018-12-16 DIAGNOSIS — M7522 Bicipital tendinitis, left shoulder: Secondary | ICD-10-CM | POA: Diagnosis not present

## 2018-12-16 HISTORY — PX: SHOULDER ARTHROSCOPY WITH SUBACROMIAL DECOMPRESSION AND BICEP TENDON REPAIR: SHX5689

## 2018-12-16 HISTORY — PX: SHOULDER ARTHROSCOPY WITH ROTATOR CUFF REPAIR AND SUBACROMIAL DECOMPRESSION: SHX5686

## 2018-12-16 HISTORY — PX: SHOULDER ARTHROSCOPY WITH DISTAL CLAVICLE RESECTION: SHX5675

## 2018-12-16 HISTORY — DX: Hypothyroidism, unspecified: E03.9

## 2018-12-16 LAB — GLUCOSE, CAPILLARY
Glucose-Capillary: 84 mg/dL (ref 70–99)
Glucose-Capillary: 109 mg/dL — ABNORMAL HIGH (ref 70–99)

## 2018-12-16 SURGERY — SHOULDER ARTHROSCOPY WITH ROTATOR CUFF REPAIR AND SUBACROMIAL DECOMPRESSION
Anesthesia: General | Site: Shoulder | Laterality: Left

## 2018-12-16 MED ORDER — ONDANSETRON HCL 4 MG/2ML IJ SOLN
INTRAMUSCULAR | Status: AC
  Filled 2018-12-16: qty 2

## 2018-12-16 MED ORDER — ONDANSETRON HCL 4 MG PO TABS: 4.0000 mg | ORAL_TABLET | Freq: Three times a day (TID) | ORAL | 1 refills | Status: AC | PRN

## 2018-12-16 MED ORDER — LIDOCAINE HCL (CARDIAC) PF 100 MG/5ML IV SOSY
PREFILLED_SYRINGE | INTRAVENOUS | Status: DC | PRN
  Administered 2018-12-16: 50 mg via INTRAVENOUS

## 2018-12-16 MED ORDER — CELECOXIB 200 MG PO CAPS: 200.0000 mg | ORAL_CAPSULE | Freq: Two times a day (BID) | ORAL | 1 refills | Status: AC

## 2018-12-16 MED ORDER — OXYCODONE HCL 5 MG/5ML PO SOLN: 5.0000 mg | Freq: Once | ORAL | Status: DC | PRN

## 2018-12-16 MED ORDER — CHLORHEXIDINE GLUCONATE 4 % EX LIQD: 60.0000 mL | Freq: Once | CUTANEOUS | Status: DC

## 2018-12-16 MED ORDER — BUPIVACAINE HCL (PF) 0.5 % IJ SOLN
INTRAMUSCULAR | Status: DC | PRN
  Administered 2018-12-16: 20 mL via PERINEURAL

## 2018-12-16 MED ORDER — ACETAMINOPHEN 500 MG PO TABS: 1000.0000 mg | ORAL_TABLET | Freq: Three times a day (TID) | ORAL | 0 refills | Status: AC

## 2018-12-16 MED ORDER — CEFAZOLIN SODIUM-DEXTROSE 2-4 GM/100ML-% IV SOLN
2.0000 g | INTRAVENOUS | Status: AC
  Administered 2018-12-16: 2 g via INTRAVENOUS

## 2018-12-16 MED ORDER — MIDAZOLAM HCL 2 MG/2ML IJ SOLN
1.0000 mg | INTRAMUSCULAR | Status: DC | PRN
  Administered 2018-12-16: 1 mg via INTRAVENOUS

## 2018-12-16 MED ORDER — HYDROMORPHONE HCL 1 MG/ML IJ SOLN: 0.2500 mg | INTRAMUSCULAR | Status: DC | PRN

## 2018-12-16 MED ORDER — DEXAMETHASONE SODIUM PHOSPHATE 4 MG/ML IJ SOLN
INTRAMUSCULAR | Status: DC | PRN
  Administered 2018-12-16: 5 mg via INTRAVENOUS

## 2018-12-16 MED ORDER — CEFAZOLIN SODIUM-DEXTROSE 2-4 GM/100ML-% IV SOLN
INTRAVENOUS | Status: AC
  Filled 2018-12-16: qty 100

## 2018-12-16 MED ORDER — BUPIVACAINE LIPOSOME 1.3 % IJ SUSP
INTRAMUSCULAR | Status: DC | PRN
  Administered 2018-12-16: 10 mL via PERINEURAL

## 2018-12-16 MED ORDER — PHENYLEPHRINE HCL (PRESSORS) 10 MG/ML IV SOLN
INTRAVENOUS | Status: AC
  Filled 2018-12-16: qty 1

## 2018-12-16 MED ORDER — SODIUM CHLORIDE 0.9 % IR SOLN
Status: DC | PRN
  Administered 2018-12-16: 12000 mL

## 2018-12-16 MED ORDER — ONDANSETRON HCL 4 MG/2ML IJ SOLN
INTRAMUSCULAR | Status: DC | PRN
  Administered 2018-12-16: 4 mg via INTRAVENOUS

## 2018-12-16 MED ORDER — OXYCODONE HCL 5 MG PO TABS: ORAL_TABLET | ORAL | 0 refills | Status: AC

## 2018-12-16 MED ORDER — LIDOCAINE 2% (20 MG/ML) 5 ML SYRINGE
INTRAMUSCULAR | Status: AC
  Filled 2018-12-16: qty 5

## 2018-12-16 MED ORDER — PROMETHAZINE HCL 25 MG/ML IJ SOLN: 6.2500 mg | INTRAMUSCULAR | Status: DC | PRN

## 2018-12-16 MED ORDER — FENTANYL CITRATE (PF) 100 MCG/2ML IJ SOLN
50.0000 ug | INTRAMUSCULAR | Status: DC | PRN
  Administered 2018-12-16: 50 ug via INTRAVENOUS

## 2018-12-16 MED ORDER — PROPOFOL 10 MG/ML IV BOLUS
INTRAVENOUS | Status: DC | PRN
  Administered 2018-12-16: 120 mg via INTRAVENOUS

## 2018-12-16 MED ORDER — FENTANYL CITRATE (PF) 100 MCG/2ML IJ SOLN
INTRAMUSCULAR | Status: AC
  Filled 2018-12-16: qty 2

## 2018-12-16 MED ORDER — SODIUM CHLORIDE 0.9 % IR SOLN
Status: DC | PRN
  Administered 2018-12-16: 4 mL

## 2018-12-16 MED ORDER — LACTATED RINGERS IV SOLN
INTRAVENOUS | Status: DC
  Administered 2018-12-16: 08:00:00 via INTRAVENOUS

## 2018-12-16 MED ORDER — MIDAZOLAM HCL 2 MG/2ML IJ SOLN
INTRAMUSCULAR | Status: AC
  Filled 2018-12-16: qty 2

## 2018-12-16 MED ORDER — PROPOFOL 500 MG/50ML IV EMUL
INTRAVENOUS | Status: AC
  Filled 2018-12-16: qty 50

## 2018-12-16 MED ORDER — DEXAMETHASONE SODIUM PHOSPHATE 10 MG/ML IJ SOLN
INTRAMUSCULAR | Status: AC
  Filled 2018-12-16: qty 1

## 2018-12-16 MED ORDER — OXYCODONE HCL 5 MG PO TABS: 5.0000 mg | ORAL_TABLET | Freq: Once | ORAL | Status: DC | PRN

## 2018-12-16 MED ORDER — SODIUM CHLORIDE 0.9 % IV SOLN
INTRAVENOUS | Status: DC | PRN
  Administered 2018-12-16: 09:00:00 50 ug/min via INTRAVENOUS

## 2018-12-16 SURGICAL SUPPLY — 80 items
AID PSTN UNV HD RSTRNT DISP (MISCELLANEOUS) ×2
ANCH SUT SWLK 19.1X4.75 (Anchor) ×2 IMPLANT
ANCHOR SUT BIO SW 4.75X19.1 (Anchor) ×1 IMPLANT
APL PRP STRL LF DISP 70% ISPRP (MISCELLANEOUS) ×2
BLADE EXCALIBUR 4.0X13 (MISCELLANEOUS) ×3 IMPLANT
BLADE SHAVER BONE 5.0X13 (MISCELLANEOUS) IMPLANT
BNDG COHESIVE 4X5 TAN STRL (GAUZE/BANDAGES/DRESSINGS) ×1 IMPLANT
BURR OVAL 8 FLU 4.0X13 (MISCELLANEOUS) IMPLANT
CANNULA 5.75X71 LONG (CANNULA) IMPLANT
CANNULA PASSPORT 5 (CANNULA) IMPLANT
CANNULA PASSPORT BUTTON 10-40 (CANNULA) ×1 IMPLANT
CANNULA TWIST IN 8.25X7CM (CANNULA) ×1 IMPLANT
CHLORAPREP W/TINT 26 (MISCELLANEOUS) ×3 IMPLANT
COVER WAND RF STERILE (DRAPES) IMPLANT
DECANTER SPIKE VIAL GLASS SM (MISCELLANEOUS) IMPLANT
DISSECTOR 3.5MM X 13CM CVD (MISCELLANEOUS) IMPLANT
DISSECTOR 4.0MMX13CM CVD (MISCELLANEOUS) IMPLANT
DRAPE IMP U-DRAPE 54X76 (DRAPES) ×3 IMPLANT
DRAPE INCISE IOBAN 66X45 STRL (DRAPES) IMPLANT
DRAPE SHOULDER BEACH CHAIR (DRAPES) ×3 IMPLANT
DRSG PAD ABDOMINAL 8X10 ST (GAUZE/BANDAGES/DRESSINGS) ×3 IMPLANT
DW OUTFLOW CASSETTE/TUBE SET (MISCELLANEOUS) ×3 IMPLANT
ELECT NDL TIP 2.8 STRL (NEEDLE) IMPLANT
ELECT NEEDLE TIP 2.8 STRL (NEEDLE) IMPLANT
ELECT REM PT RETURN 9FT ADLT (ELECTROSURGICAL) ×3
ELECTRODE REM PT RTRN 9FT ADLT (ELECTROSURGICAL) ×2 IMPLANT
GAUZE SPONGE 4X4 12PLY STRL (GAUZE/BANDAGES/DRESSINGS) ×3 IMPLANT
GAUZE XEROFORM 1X8 LF (GAUZE/BANDAGES/DRESSINGS) IMPLANT
GLOVE BIOGEL PI IND STRL 7.0 (GLOVE) IMPLANT
GLOVE BIOGEL PI IND STRL 8 (GLOVE) ×2 IMPLANT
GLOVE BIOGEL PI INDICATOR 7.0 (GLOVE) ×1
GLOVE BIOGEL PI INDICATOR 8 (GLOVE) ×2
GLOVE ECLIPSE 6.5 STRL STRAW (GLOVE) ×1 IMPLANT
GLOVE ECLIPSE 8.0 STRL XLNG CF (GLOVE) ×3 IMPLANT
GOWN STRL REUS W/ TWL LRG LVL3 (GOWN DISPOSABLE) ×2 IMPLANT
GOWN STRL REUS W/TWL LRG LVL3 (GOWN DISPOSABLE) ×3
GOWN STRL REUS W/TWL XL LVL3 (GOWN DISPOSABLE) ×3 IMPLANT
IMPL SPEEDBRIDGE KIT (Orthopedic Implant) IMPLANT
IMPLANT SPEEDBRIDGE KIT (Orthopedic Implant) ×3 IMPLANT
KIT STABILIZATION SHOULDER (MISCELLANEOUS) ×3 IMPLANT
KIT STR SPEAR 1.8 FBRTK DISP (KITS) IMPLANT
LASSO 90 CVE QUICKPAS (DISPOSABLE) IMPLANT
LASSO CRESCENT QUICKPASS (SUTURE) ×1 IMPLANT
MANIFOLD NEPTUNE II (INSTRUMENTS) ×3 IMPLANT
NDL SAFETY ECLIPSE 18X1.5 (NEEDLE) ×2 IMPLANT
NDL SCORPION MULTI FIRE (NEEDLE) IMPLANT
NDL SUT 6 .5 CRC .975X.05 MAYO (NEEDLE) IMPLANT
NEEDLE HYPO 18GX1.5 SHARP (NEEDLE) ×3
NEEDLE MAYO TAPER (NEEDLE)
NEEDLE SCORPION MULTI FIRE (NEEDLE) ×3 IMPLANT
PACK ARTHROSCOPY DSU (CUSTOM PROCEDURE TRAY) ×3 IMPLANT
PACK BASIN DAY SURGERY FS (CUSTOM PROCEDURE TRAY) ×3 IMPLANT
PAD ORTHO SHOULDER 7X19 LRG (SOFTGOODS) ×1 IMPLANT
PENCIL BUTTON HOLSTER BLD 10FT (ELECTRODE) IMPLANT
PORT APPOLLO RF 90DEGREE MULTI (SURGICAL WAND) ×3 IMPLANT
RESTRAINT HEAD UNIVERSAL NS (MISCELLANEOUS) ×3 IMPLANT
SHEET MEDIUM DRAPE 40X70 STRL (DRAPES) ×3 IMPLANT
SLEEVE SCD COMPRESS KNEE MED (MISCELLANEOUS) ×3 IMPLANT
SLING ARM FOAM STRAP LRG (SOFTGOODS) IMPLANT
SLING ARM IMMOBILIZER LRG (SOFTGOODS) IMPLANT
SLING ARM IMMOBILIZER MED (SOFTGOODS) IMPLANT
SLING ARM MED ADULT FOAM STRAP (SOFTGOODS) IMPLANT
SLING ARM XL FOAM STRAP (SOFTGOODS) IMPLANT
SPONGE LAP 4X18 RFD (DISPOSABLE) IMPLANT
STRIP CLOSURE SKIN 1/2X4 (GAUZE/BANDAGES/DRESSINGS) IMPLANT
SUCTION FRAZIER HANDLE 10FR (MISCELLANEOUS)
SUCTION TUBE FRAZIER 10FR DISP (MISCELLANEOUS) IMPLANT
SUT FIBERWIRE #2 38 T-5 BLUE (SUTURE)
SUT MNCRL AB 4-0 PS2 18 (SUTURE) ×3 IMPLANT
SUT PDS AB 1 CT  36 (SUTURE)
SUT PDS AB 1 CT 36 (SUTURE) IMPLANT
SUT TIGER TAPE 7 IN WHITE (SUTURE) IMPLANT
SUTURE FIBERWR #2 38 T-5 BLUE (SUTURE) IMPLANT
SUTURE TAPE TIGERLINK 1.3MM BL (SUTURE) IMPLANT
SUTURETAPE TIGERLINK 1.3MM BL (SUTURE) ×3
SYR 5ML LUER SLIP (SYRINGE) ×2 IMPLANT
TAPE FIBER 2MM 7IN #2 BLUE (SUTURE) ×1 IMPLANT
TOWEL GREEN STERILE FF (TOWEL DISPOSABLE) ×3 IMPLANT
TUBE SUCTION HIGH CAP CLEAR NV (SUCTIONS) IMPLANT
TUBING ARTHROSCOPY IRRIG 16FT (MISCELLANEOUS) ×3 IMPLANT

## 2018-12-16 NOTE — Transfer of Care (Signed)
Immediate Anesthesia Transfer of Care Note  Patient: Richard Ho  Procedure(s) Performed: LEFT SHOULDER ARTHROSCOPY WITH ROTATOR CUFF REPAIR AND SUBACROMIAL DECOMPRESSION, DEBRIDEMENT (Left ) LEFT SHOULDER ARTHROSCOPY WITH DISTAL CLAVICLE RESECTION (Left ) SHOULDER ARTHROSCOPY WITH SUBACROMIAL DECOMPRESSION AND BICEP TENDON REPAIR (Left Shoulder)  Patient Location: PACU  Anesthesia Type:General and Regional  Level of Consciousness: awake and sedated  Airway & Oxygen Therapy: Patient Spontanous Breathing and Patient connected to nasal cannula oxygen  Post-op Assessment: Report given to RN and Post -op Vital signs reviewed and stable  Post vital signs: Reviewed and stable  Last Vitals:  Vitals Value Taken Time  BP 148/73 12/16/18 1045  Temp    Pulse 80 12/16/18 1048  Resp 6 12/16/18 1048  SpO2 99 % 12/16/18 1048  Vitals shown include unvalidated device data.  Last Pain:  Vitals:   12/16/18 0752  TempSrc: Oral  PainSc: 5       Patients Stated Pain Goal: 5 (14/48/18 5631)  Complications: No apparent anesthesia complications

## 2018-12-16 NOTE — Anesthesia Procedure Notes (Signed)
Procedure Name: LMA Insertion Performed by: Hser Belanger W, CRNA Pre-anesthesia Checklist: Patient identified, Emergency Drugs available, Suction available and Patient being monitored Patient Re-evaluated:Patient Re-evaluated prior to induction Oxygen Delivery Method: Circle system utilized Preoxygenation: Pre-oxygenation with 100% oxygen Induction Type: IV induction Ventilation: Mask ventilation without difficulty LMA: LMA inserted LMA Size: 5.0 Number of attempts: 1 Placement Confirmation: positive ETCO2 Tube secured with: Tape Dental Injury: Teeth and Oropharynx as per pre-operative assessment        

## 2018-12-16 NOTE — Discharge Instructions (Signed)

## 2018-12-16 NOTE — Op Note (Signed)
Orthopaedic Surgery Operative Note (CSN: [id number])  Richard Ho  [birth year] Date of Surgery: 12/16/2018   Diagnoses:  Left shoulder acute on chronic rotator cuff tear  Procedure: Left shoulder arthroscopic rotator cuff repair: Subscapularis, supraspinatus, infraspinatus with poor tissue quality Left shoulder arthroscopic biceps tenodesis Left shoulder subacromial decompression Distal clavicle excision Arthroscopic extensive debridement   Operative Finding Exam under anesthesia: Limited forward elevation 110 degrees, external rotation to 45 degrees Articular space: No loose bodies, capsule intact, significant anterior labral fraying, significant injection of the anterior interval Chondral surfaces:Intact, no sign of chondral degeneration on the glenoid or humeral head Biceps: Type II SLAP tear Subscapularis: Upper border 50% tear Superior Cuff: Large tear with extremely poor tissue quality of the supraspinatus and infraspinatus Bursal side: Large tear with extremely poor tissue quality of the supraspinatus and infraspinatus with a delaminating component.  Patient had extraordinarily poor tissue.  This was clearly an acute on chronic type issue and there was significant delamination of the superior cuff.  There is a small layer that was still partially intact on most of the footprint but it was the most articular part of the cuff less than 10% of the thickness.  This was completed in the entirety of the cuff was attempted to repair.  There is significant fraying and calcific change in the tip of the cuff and this was debrided.  We are able to get a full repair of the cuff back to bone however this was a tenuous repair due to poor tissue quality.  We did tied a medial row as is typical.  Patient has a high risk of failure with 40% deteriorate noted.  He does not have any arthritis however considering his diabetes and his large tear we think that if he needed another surgery would be a  reverse total shoulder arthroplasty.  I have a low threshold for scheduling this potentially even without an MRI based on his clinical exam even as soon as 5 to 6 months postop.  Hopefully he heals well and this is not necessary.   Post-operative plan: The patient will be nonweightbearing in a sling for 6 weeks with a late start to therapy.  The patient will be charged home.  DVT prophylaxis not indicated in ambulatory upper extremity patient without known risk factors.   Pain control with PRN pain medication preferring oral medicines.  Follow up plan will be scheduled in approximately 7 days for incision check and XR.  Post-Op Diagnosis: Same Surgeons:Primary: Hiram Gash, MD Assistants: Location: [place] OR ROOM 6 Anesthesia: General with Exparel interscalene block Antibiotics: Ancef 2g preop Tourniquet time: * No tourniquets in log * Estimated Blood Loss: Minimal Complications: None Specimens: None Implants: Implant Name Type Inv. Item Serial No. Manufacturer Lot No. LRB No. Used Action  ANCHOR SUT BIO SW 4.75X19.1 - [id number] Anchor ANCHOR SUT BIO SW 4.75X19.1  [place] [id number] Left 1 Implanted  IMPLANT SPEEDBRIDGE KIT - [id number] Orthopedic Implant IMPLANT SPEEDBRIDGE KIT  [place] [id number] Left 1 Implanted    Indications for Surgery:   Richard Ho is a 78 y.o. male with continued shoulder pain refractory to nonoperative measures for extended period of time.  Patient had a fall and this is an acute change in his condition type issue however he has significant symptoms refractory to nonoperative measures and prior to his fall had no previous shoulder pain.  The risks and benefits were explained at length including but not limited to continued pain, cuff failure, biceps  tenodesis failure, stiffness, need for further surgery and infection.   Procedure:   Patient was correctly identified in the preoperative holding area and operative site marked.  Patient brought to OR and  positioned beachchair on an [place] table ensuring that all bony prominences were padded and the head was in an appropriate location.  Anesthesia was induced and the operative shoulder was prepped and draped in the usual sterile fashion.  Timeout was called preincision.  A standard posterior viewing portal was made after localizing the portal with a spinal needle.  An anterior accessory portal was also made.  After clearing the articular space the camera was positioned in the subacromial space.  Findings above.  Extensive debridement performed of the biceps stump, anterior labrum with a complete release of the interval.  Anterior-inferior capsule was also released with a shaver and RF ablator.  Subscapularis Repair and bicep tenodesis: We identified a subscapularis tear that involved the upper border 50% of the scapularis with medialization of the biceps.  Using a grasping device were able to demonstrate that the tendon could be reapproximated to the lesser tuberosity.  We felt that it was repairable.  We then used a RF ablator to open the rotator interval and released the MGHL to allow further translation of the subscapularis.    Then passed a fiber loop suture around and through the substance of the biceps while still connected.  A tenotomy was then performed and this suture was passed and held to be later placed in the same swivel lock as the subscapularis repairs anchor.  This point we cleared both anterior and posterior to the tendon taking care to avoid inferior migration to avoid the neurovascular structures as well as the muscular cutaneous nerve.  We then used a lasso to pass a fiber tape in a mattress fashion through the subscapularis and based 4.75 swivel lock was used to repair back to the lesser tuberosity after prepping the tuberosity extensively.  We had good approximation of the tendon and a recreation of the rolled border.  The biceps was also appropriately positioned at this  point.  Subacromial decompression: We made a lateral portal with spinal needle guidance. We then proceeded to debride bursal tissue extensively with a shaver and arthrocare device. At that point we continued to identify the borders of the acromion and identify the spur. We then carefully preserved the deltoid fascia and used a burr to convert the type 2 acromion to a Type 1 flat acromion without issue.  Distal Clavicle resection:  The scope was placed in the subacromial space from the posterior portal.  A hemostat was placed through the anterior portal and we spread at the [facility] joint.  A burr was then inserted and 10 mm of distal clavicle was resected taking care to avoid damage to the capsule around the joint and avoiding overhanging bone posteriorly.    Arthroscopic Rotator Cuff Repair: Tuberosity was prepared with a burr to a bleeding bed.  Following completion of the above we placed 2 4.7 Swivelock anchor loaded with a tape at inserted at the medial articular margin and an scorpion suture passing device, shuttled  sutures medially in a horizontal mattress suture configuration.  We actually had just passed some of the sutures separately as there was a clear delamination of the tissue planes and we want to make sure we had purchase of both layers of tissue.  We then tied using arthroscopic knot tying techniques  each suture to its partner reducing the tendon  at the prepared insertion site.  The fiber tape was not tied. With a medial row suture limbs then incorporated, 2 anteriorly and 2 posteriorly, into each of two 4.75 PEEK SwiveLock anchors, each placed 8 to 10 mm below the tip of the tuberosity and spanning anterior-posterior width of the tear with care to avoid over tensioning.   The incisions were closed with absorbable monocryl and steri strips.  A sterile dressing was placed along with a sling. The patient was awoken from general anesthesia and taken to the PACU in stable condition without  complication.

## 2018-12-16 NOTE — Progress Notes (Signed)
Assisted Dr. Miller with left, ultrasound guided, interscalene  block. Side rails up, monitors on throughout procedure. See vital signs in flow sheet. Tolerated Procedure well.  

## 2018-12-16 NOTE — Anesthesia Postprocedure Evaluation (Signed)
Anesthesia Post Note  Patient: Richard Ho  Procedure(s) Performed: LEFT SHOULDER ARTHROSCOPY WITH ROTATOR CUFF REPAIR AND SUBACROMIAL DECOMPRESSION, DEBRIDEMENT (Left ) LEFT SHOULDER ARTHROSCOPY WITH DISTAL CLAVICLE RESECTION (Left ) SHOULDER ARTHROSCOPY WITH SUBACROMIAL DECOMPRESSION AND BICEP TENDON REPAIR (Left Shoulder)     Patient location during evaluation: PACU Anesthesia Type: General Level of consciousness: awake and alert Pain management: pain level controlled Vital Signs Assessment: post-procedure vital signs reviewed and stable Respiratory status: spontaneous breathing, nonlabored ventilation and respiratory function stable Cardiovascular status: blood pressure returned to baseline and stable Postop Assessment: no apparent nausea or vomiting Anesthetic complications: no    Last Vitals:  Vitals:   12/16/18 1220 12/16/18 1225  BP:    Pulse: 80 80  Resp:    Temp:    SpO2: 96% 96%    Last Pain:  Vitals:   12/16/18 1115  TempSrc:   PainSc: 0-No pain                 Lynda Rainwater

## 2018-12-16 NOTE — H&P (Signed)
PREOPERATIVE H&P  Chief Complaint: LEFT SHOULDER IMPINGEMENT SYNDROME, ROTATOR CUFF TEAR  HPI: Richard Ho is a [age] y.o. male who presents for preoperative history and physical with a diagnosis of LEFT SHOULDER IMPINGEMENT SYNDROME, ROTATOR CUFF TEAR. Symptoms are rated as moderate to severe, and have been worsening.  This is significantly impairing activities of daily living.  Please see my clinic note for full details on this patient's care.  He has elected for surgical management.   Past Medical History:  Diagnosis Date  . Asthma   . Coronary artery disease 1991   a) Angioplasty LAD 1991 [place] b) MI in 1995 at  [facility] in [place], med Rx  . Diabetes mellitus   . GERD (gastroesophageal reflux disease)   . Hearing loss   . HTN (hypertension)   . Hyperlipidemia   . Hypothyroidism   . Myocardial infarct (HCC)   . Nephrolithiasis   . Osteopenia   . Snake bite    Past Surgical History:  Procedure Laterality Date  . ANGIOPLASTY  1991  . CARDIAC CATHETERIZATION  2005   Moderate LCx and LAD disease and severe diffuse RCA disease  . CARDIAC CATHETERIZATION  01/2012   normal left main, 50% pLAD stenosis, dLAD mild luminal irregularities, D1 30-40% stenosis at take off; subtotally occluded/severely diseased OM1, mid 80% stenosis in small OM2, long prox and mid RCA stenoses ranging from 60-95%, severely diseased PDA, distal PLSA occluded filling via L-R collateral; normal LV function; inferior lateral basal akinesis.  . CHOLECYSTECTOMY  2012  . EYE SURGERY  2010   cataract - right, has left done too  . INGUINAL HERNIA REPAIR    . LEFT HEART CATHETERIZATION WITH CORONARY ANGIOGRAM N/A 01/28/2012   Procedure: LEFT HEART CATHETERIZATION WITH CORONARY ANGIOGRAM;  Surgeon: Iran OuchMuhammad A Arida, MD;  Location: [facility] CATH LAB;  Service: Cardiovascular;  Laterality: N/A;  . LEFT HEART CATHETERIZATION WITH CORONARY ANGIOGRAM N/A 03/17/2014   Procedure: LEFT HEART CATHETERIZATION WITH  CORONARY ANGIOGRAM;  Surgeon: Kathleene Hazelhristopher D McAlhany, MD;  [facility];  Service: Cardiovascular;  Laterality: N/A;  . ORIF TIBIA & FIBULA FRACTURES    . PTCA    . VASECTOMY     Social History   Socioeconomic History  . Marital status: Married    Spouse name: Not on file  . Number of children: Not on file  . Years of education: Not on file  . Highest education level: Not on file  Occupational History  . Occupation: Retired  Engineer, productionocial Needs  . Financial resource strain: Not on file  . Food insecurity    Worry: Not on file    Inability: Not on file  . Transportation needs    Medical: Not on file    Non-medical: Not on file  Tobacco Use  . Smoking status: Never Smoker  . Smokeless tobacco: Never Used  Substance and Sexual Activity  . Alcohol use: Yes    Alcohol/week: 0.0 standard drinks    Comment: occ, once every 2 months  . Drug use: No  . Sexual activity: Not on file  Lifestyle  . Physical activity    Days per week: Not on file    Minutes per session: Not on file  . Stress: Not on file  Relationships  . Social Musicianconnections    Talks on phone: Not on file    Gets together: Not on file    Attends religious service: Not on file    Active member of club or  organization: Not on file    Attends meetings of clubs or organizations: Not on file    Relationship status: Not on file  Other Topics Concern  . Not on file  Social History Narrative  . Not on file   Family History  Problem Relation Age of Onset  . Asthma Mother   . Allergies Sister   . Coronary artery disease Other    Allergies  Allergen Reactions  . Meloxicam Other (See Comments)    "jumping out of my skin" Pt feels anxiety attacks when on this medicine   Prior to Admission medications   Medication Sig Start Date End Date Taking? Authorizing Provider  albuterol (VENTOLIN HFA) 108 (90 Base) MCG/ACT inhaler Inhale two puffs every four to six hours as needed for cough or wheeze. 06/29/18  Yes Kozlow,  Alvira PhilipsEric J, MD  aspirin EC 81 MG tablet Take 81 mg by mouth daily.   Yes [provider]  b complex vitamins capsule Take 1 capsule by mouth daily.   Yes [provider]  fluticasone (FLONASE) 50 MCG/ACT nasal spray Use one to two sprays in each nostril once daily as directed. 06/29/18  Yes Kozlow, Alvira PhilipsEric J, MD  fluticasone (FLOVENT HFA) 110 MCG/ACT inhaler Inhale three puffs three times daily during flare-up.  Rinse, gargle, and spit after use. 06/29/18  Yes Kozlow, Alvira PhilipsEric J, MD  fluticasone furoate-vilanterol (BREO ELLIPTA) 200-25 MCG/INH AEPB Inhale 1 puff into the lungs daily. Rinse, gargle, and spit after use. 06/29/18  Yes Kozlow, Alvira PhilipsEric J, MD  glipiZIDE (GLUCOTROL XL) 2.5 MG 24 hr tablet Take 1 tablet (2.5 mg total) by mouth daily with breakfast. 02/01/18  Yes Gordy SaversKwiatkowski, Peter F, MD  ipratropium (ATROVENT) 0.06 % nasal spray Can use two sprays in each nostril every six hours if needed to dry up runny nose. 06/29/18  Yes Kozlow, Alvira PhilipsEric J, MD  isosorbide mononitrate (IMDUR) 30 MG 24 hr tablet TAKE ONE TABLET BY MOUTH TWICE DAILY 06/30/16  Yes Gordy SaversKwiatkowski, Peter F, MD  levothyroxine (SYNTHROID, LEVOTHROID) 50 MCG tablet Take 1 tablet (50 mcg total) by mouth daily. 01/26/17  Yes Gordy SaversKwiatkowski, Peter F, MD  metFORMIN (GLUCOPHAGE) 1000 MG tablet Take 1 tablet (1,000 mg total) by mouth 2 (two) times daily with a meal. 07/21/18  Yes Philip AspenHernandez Acosta, Limmie PatriciaEstela Y, MD  metoprolol tartrate (LOPRESSOR) 50 MG tablet Take 1 tablet (50 mg total) by mouth 2 (two) times daily. 07/14/18  Yes Philip AspenHernandez Acosta, Limmie PatriciaEstela Y, MD  Multiple Vitamins-Minerals (CENTRUM ADULTS PO) Take by mouth.   Yes [provider]  pantoprazole (PROTONIX) 40 MG tablet Take 40 mg by mouth daily.   Yes [provider]  pregabalin (LYRICA) 75 MG capsule Take 1 capsule by mouth twice daily 12/07/18  Yes Philip AspenHernandez Acosta, Limmie PatriciaEstela Y, MD  simvastatin (ZOCOR) 80 MG tablet Take 40 mg by mouth at bedtime. [facility] from [facility]   Yes [provider]  tamsulosin (FLOMAX) 0.4 MG CAPS capsule TAKE ONE CAPSULE BY MOUTH DAILY 06/26/14  Yes Gordy SaversKwiatkowski, Peter F, MD  traZODone (DESYREL) 50 MG tablet Take 0.5 tablets (25 mg total) by mouth at bedtime as needed for sleep. 04/29/18  Yes Philip AspenHernandez Acosta, Limmie PatriciaEstela Y, MD  glucose blood (PRECISION XTRA TEST STRIPS) test strip USE TO CHECK BLOOD SUGAR DAILY 04/21/14   Gordy SaversKwiatkowski, Peter F, MD  montelukast (SINGULAIR) 10 MG tablet Take 1 tablet (10 mg total) by mouth daily. 06/29/18   Kozlow, Alvira PhilipsEric J, MD  nitroGLYCERIN (NITROSTAT) 0.4 MG SL tablet Place  1 tablet (0.4 mg total) under the tongue every 5 (five) minutes as needed. Chest pain 11/21/13   Marletta Lor, MD     Positive ROS: All other systems have been reviewed and were otherwise negative with the exception of those mentioned in the HPI and as above.  Physical Exam: General: Alert, no acute distress Cardiovascular: No pedal edema Respiratory: No cyanosis, no use of accessory musculature GI: No organomegaly, abdomen is soft and non-tender Skin: No lesions in the area of chief complaint Neurologic: Sensation intact distally Psychiatric: Patient is competent for consent with normal mood and affect Lymphatic: No axillary or cervical lymphadenopathy  MUSCULOSKELETAL: L shoulder: +AC, obrien, impingement, 4/5 supra  Assessment: LEFT SHOULDER IMPINGEMENT SYNDROME, ROTATOR CUFF TEAR  Plan: Plan for Procedure(s): LEFT SHOULDER ARTHROSCOPY WITH ROTATOR CUFF REPAIR AND SUBACROMIAL DECOMPRESSION, DEBRIDEMENT LEFT SHOULDER ARTHROSCOPY WITH DISTAL CLAVICLE RESECTION  The risks benefits and alternatives were discussed with the patient including but not limited to the risks of nonoperative treatment, versus surgical intervention including infection, bleeding, nerve injury,  blood clots, cardiopulmonary complications, morbidity, mortality, among others, and they were willing to proceed.   Hiram Gash, MD  12/16/2018 8:17 AM

## 2018-12-16 NOTE — Anesthesia Preprocedure Evaluation (Signed)
Anesthesia Evaluation  Patient identified by MRN, date of birth, ID band Patient awake    Reviewed: Allergy & Precautions, NPO status , Patient's Chart, lab work & pertinent test results, reviewed documented beta blocker date and time   Airway Mallampati: II  TM Distance: >3 FB Neck ROM: Full    Dental no notable dental hx.    Pulmonary asthma ,    Pulmonary exam normal breath sounds clear to auscultation       Cardiovascular hypertension, Pt. on medications and Pt. on home beta blockers + CAD  Normal cardiovascular exam Rhythm:Regular Rate:Normal     Neuro/Psych  Neuromuscular disease negative psych ROS   GI/Hepatic negative GI ROS, Neg liver ROS,   Endo/Other  diabetes, Type 2Hypothyroidism   Renal/GU negative Renal ROS  negative genitourinary   Musculoskeletal negative musculoskeletal ROS (+)   Abdominal (+) + obese,   Peds negative pediatric ROS (+)  Hematology negative hematology ROS (+)   Anesthesia Other Findings   Reproductive/Obstetrics negative OB ROS                             Anesthesia Physical Anesthesia Plan  ASA: III  Anesthesia Plan: General   Post-op Pain Management:  Regional for Post-op pain   Induction: Intravenous  PONV Risk Score and Plan: 2 and Ondansetron, Midazolam and Treatment may vary due to age or medical condition  Airway Management Planned: LMA  Additional Equipment:   Intra-op Plan:   Post-operative Plan: Extubation in OR  Informed Consent: I have reviewed the patients History and Physical, chart, labs and discussed the procedure including the risks, benefits and alternatives for the proposed anesthesia with the patient or authorized representative who has indicated his/her understanding and acceptance.     Dental advisory given  Plan Discussed with: CRNA  Anesthesia Plan Comments:         Anesthesia Quick Evaluation

## 2018-12-16 NOTE — Anesthesia Procedure Notes (Signed)
Anesthesia Regional Block: Interscalene brachial plexus block   Pre-Anesthetic Checklist: ,, timeout performed, Correct Patient, Correct Site, Correct Laterality, Correct Procedure, Correct Position, site marked, Risks and benefits discussed,  Surgical consent,  Pre-op evaluation,  At surgeon's request and post-op pain management  Laterality: Left  Prep: chloraprep       Needles:  Injection technique: Single-shot  Needle Type: Stimiplex     Needle Length: 9cm  Needle Gauge: 21     Additional Needles:   Procedures:,,,, ultrasound used (permanent image in chart),,,,  Narrative:  Start time: 12/16/2018 8:31 AM End time: 12/16/2018 8:36 AM Injection made incrementally with aspirations every 5 mL.  Performed by: Personally  Anesthesiologist: Lynda Rainwater, MD

## 2018-12-17 ENCOUNTER — Emergency Department (HOSPITAL_COMMUNITY): Payer: Medicare Other

## 2018-12-17 ENCOUNTER — Encounter (HOSPITAL_COMMUNITY): Payer: Self-pay

## 2018-12-17 ENCOUNTER — Emergency Department (HOSPITAL_COMMUNITY)
Admission: EM | Admit: 2018-12-17 | Discharge: 2018-12-17 | Disposition: A | Discharge status: Home / Self Care | Payer: Medicare Other | Attending: Emergency Medicine | Admitting: Emergency Medicine

## 2018-12-17 DIAGNOSIS — Z9049 Acquired absence of other specified parts of digestive tract: Secondary | ICD-10-CM | POA: Insufficient documentation

## 2018-12-17 DIAGNOSIS — Z7984 Long term (current) use of oral hypoglycemic drugs: Secondary | ICD-10-CM | POA: Diagnosis not present

## 2018-12-17 DIAGNOSIS — Z7982 Long term (current) use of aspirin: Secondary | ICD-10-CM | POA: Insufficient documentation

## 2018-12-17 DIAGNOSIS — Z79899 Other long term (current) drug therapy: Secondary | ICD-10-CM | POA: Insufficient documentation

## 2018-12-17 DIAGNOSIS — I252 Old myocardial infarction: Secondary | ICD-10-CM | POA: Diagnosis not present

## 2018-12-17 DIAGNOSIS — E1122 Type 2 diabetes mellitus with diabetic chronic kidney disease: Secondary | ICD-10-CM | POA: Diagnosis not present

## 2018-12-17 DIAGNOSIS — R05 Cough: Secondary | ICD-10-CM | POA: Diagnosis not present

## 2018-12-17 DIAGNOSIS — F419 Anxiety disorder, unspecified: Secondary | ICD-10-CM

## 2018-12-17 DIAGNOSIS — E039 Hypothyroidism, unspecified: Secondary | ICD-10-CM | POA: Diagnosis not present

## 2018-12-17 DIAGNOSIS — I129 Hypertensive chronic kidney disease with stage 1 through stage 4 chronic kidney disease, or unspecified chronic kidney disease: Secondary | ICD-10-CM | POA: Diagnosis not present

## 2018-12-17 DIAGNOSIS — R0602 Shortness of breath: Secondary | ICD-10-CM | POA: Diagnosis not present

## 2018-12-17 DIAGNOSIS — R059 Cough, unspecified: Secondary | ICD-10-CM

## 2018-12-17 DIAGNOSIS — I2510 Atherosclerotic heart disease of native coronary artery without angina pectoris: Secondary | ICD-10-CM | POA: Diagnosis not present

## 2018-12-17 DIAGNOSIS — N183 Chronic kidney disease, stage 3 (moderate): Secondary | ICD-10-CM | POA: Diagnosis not present

## 2018-12-17 DIAGNOSIS — J45909 Unspecified asthma, uncomplicated: Secondary | ICD-10-CM | POA: Insufficient documentation

## 2018-12-17 DIAGNOSIS — R0789 Other chest pain: Secondary | ICD-10-CM | POA: Diagnosis not present

## 2018-12-17 DIAGNOSIS — R079 Chest pain, unspecified: Secondary | ICD-10-CM | POA: Diagnosis not present

## 2018-12-17 LAB — BASIC METABOLIC PANEL
Anion gap: 12 (ref 5–15)
BUN: 24 mg/dL — ABNORMAL HIGH (ref 8–23)
CO2: 20 mmol/L — ABNORMAL LOW (ref 22–32)
Calcium: 9.2 mg/dL (ref 8.9–10.3)
Chloride: 103 mmol/L (ref 98–111)
Creatinine, Ser: 1.76 mg/dL — ABNORMAL HIGH (ref 0.61–1.24)
GFR calc Af Amer: 42 mL/min — ABNORMAL LOW (ref >60)
GFR calc non Af Amer: 36 mL/min — ABNORMAL LOW (ref >60)
Glucose, Bld: 128 mg/dL — ABNORMAL HIGH (ref 70–99)
Potassium: 4.1 mmol/L (ref 3.5–5.1)
Sodium: 135 mmol/L (ref 135–145)

## 2018-12-17 LAB — CBC
HCT: 35.9 % — ABNORMAL LOW (ref 39.0–52.0)
Hemoglobin: 11.9 g/dL — ABNORMAL LOW (ref 13.0–17.0)
MCH: 34.2 pg — ABNORMAL HIGH (ref 26.0–34.0)
MCHC: 33.1 g/dL (ref 30.0–36.0)
MCV: 103.2 fL — ABNORMAL HIGH (ref 80.0–100.0)
Platelets: 216 10*3/uL (ref 150–400)
RBC: 3.48 MIL/uL — ABNORMAL LOW (ref 4.22–5.81)
RDW: 14.6 % (ref 11.5–15.5)
WBC: 11.3 10*3/uL — ABNORMAL HIGH (ref 4.0–10.5)
nRBC: 0 % (ref 0.0–0.2)

## 2018-12-17 LAB — TROPONIN I (HIGH SENSITIVITY): Troponin I (High Sensitivity): 9 ng/L (ref <18)

## 2018-12-17 MED ORDER — SODIUM CHLORIDE 0.9% FLUSH
3.0000 mL | Freq: Once | INTRAVENOUS | Status: AC
  Administered 2018-12-17: 3 mL via INTRAVENOUS

## 2018-12-17 MED ORDER — DOXYCYCLINE HYCLATE 100 MG PO CAPS: 100.0000 mg | ORAL_CAPSULE | Freq: Two times a day (BID) | ORAL | 0 refills | Status: DC

## 2018-12-17 MED ORDER — ALPRAZOLAM 0.25 MG PO TABS: 0.2500 mg | ORAL_TABLET | Freq: Three times a day (TID) | ORAL | 0 refills | Status: DC | PRN

## 2018-12-17 MED ORDER — ALBUTEROL SULFATE HFA 108 (90 BASE) MCG/ACT IN AERS
2.0000 | INHALATION_SPRAY | Freq: Once | RESPIRATORY_TRACT | Status: AC
  Administered 2018-12-17: 2 via RESPIRATORY_TRACT
  Filled 2018-12-17: qty 6.7

## 2018-12-17 MED ORDER — IOHEXOL 350 MG/ML SOLN
100.0000 mL | Freq: Once | INTRAVENOUS | Status: AC | PRN
  Administered 2018-12-17: 75 mL via INTRAVENOUS

## 2018-12-17 MED ORDER — DIAZEPAM 5 MG PO TABS
5.0000 mg | ORAL_TABLET | Freq: Once | ORAL | Status: AC
  Administered 2018-12-17: 11:00:00 5 mg via ORAL
  Filled 2018-12-17: qty 1

## 2018-12-17 MED ORDER — ALBUTEROL SULFATE HFA 108 (90 BASE) MCG/ACT IN AERS
2.0000 | INHALATION_SPRAY | RESPIRATORY_TRACT | Status: DC
  Administered 2018-12-17: 2 via RESPIRATORY_TRACT
  Filled 2018-12-17: qty 6.7

## 2018-12-17 MED ORDER — SODIUM CHLORIDE (PF) 0.9 % IJ SOLN
INTRAMUSCULAR | Status: AC
  Administered 2018-12-17: 12:00:00
  Filled 2018-12-17: qty 50

## 2018-12-17 MED ORDER — LORAZEPAM 2 MG/ML IJ SOLN
1.0000 mg | Freq: Once | INTRAMUSCULAR | Status: AC
  Administered 2018-12-17: 1 mg via INTRAVENOUS
  Filled 2018-12-17: qty 1

## 2018-12-17 NOTE — ED Provider Notes (Signed)
[facility] DEPT Provider Note   CSN: [id number] Arrival date & time: 12/17/18  7322     History   Chief Complaint Chief Complaint  Patient presents with  . Chest Pain    HPI Richard Ho is a 78 y.o. male.     78 year old male who presents with chest tightness that began last night.  Patient had left shoulder surgery done yesterday.  States he had a scratchy throat afterwards but this is likely from having an LMA placed.  States he does have a history of asthma and has been using his inhaler with some relief.  Symptoms have been lasting for hours at a time and have been associate with diaphoresis.  States that symptoms have been somewhat exertional but denies any fever or productive cough.  No leg pain or swelling.  He has had increased anxiety.  Called his doctor and was told to come here for further management     Past Medical History:  Diagnosis Date  . Asthma   . Coronary artery disease 1991   a) Angioplasty LAD 1991 [place] b) MI in 1995 at  [facility] in [place], med Rx  . Diabetes mellitus   . GERD (gastroesophageal reflux disease)   . Hearing loss   . HTN (hypertension)   . Hyperlipidemia   . Hypothyroidism   . Myocardial infarct ([place])   . Nephrolithiasis   . Osteopenia   . Snake bite     Patient Active Problem List   Diagnosis Date Noted  . Hyperkalemia 11/03/2018  . Retinal detachment 04/29/2018  . Morbid obesity ([place]) 04/29/2018  . Diabetes mellitus with renal complications ([place]) 02/54/2706  . After cataract of both eyes not obscuring vision 02/17/2014  . Dry eye syndrome, bilateral 02/17/2014  . PVD (posterior vitreous detachment), both eyes 02/17/2014  . Chronic kidney disease, stage III (moderate) ([place]) 01/26/2014  . Diabetic peripheral neuropathy ([place]) 11/21/2013  . Pseudophakia of both eyes 08/18/2013  . Pseudophakia, left eye 07/07/2013  . Pseudophakia of right eye 06/24/2013  . After cataract, right  eye 05/02/2013  . Arcus senilis of both corneas 05/02/2013  . Combined senile cataract 05/02/2013  . Dermatochalasis of eyelid 05/02/2013  . PVD (posterior vitreous detachment), right eye 05/02/2013  . Macrocytic anemia 01/29/2012  . Hypothyroidism 10/22/2011  . Type 2 diabetes mellitus with renal complication ([place]) 23/76/2831  . Asthma with exacerbation 06/09/2007  . Essential hypertension 04/08/2007  . Dyslipidemia 05/04/2006  . MYOCARDIAL INFARCTION, HX OF 05/04/2006  . Coronary atherosclerosis 05/04/2006  . Asthma 05/04/2006  . GERD 05/04/2006  . OSTEOPENIA 05/04/2006  . PSA, INCREASED 05/04/2006  . NEPHROLITHIASIS, HX OF 05/04/2006    Past Surgical History:  Procedure Laterality Date  . ANGIOPLASTY  1991  . CARDIAC CATHETERIZATION  2005   Moderate LCx and LAD disease and severe diffuse RCA disease  . CARDIAC CATHETERIZATION  01/2012   normal left main, 50% pLAD stenosis, dLAD mild luminal irregularities, D1 30-40% stenosis at take off; subtotally occluded/severely diseased OM1, mid 80% stenosis in small OM2, long prox and mid RCA stenoses ranging from 60-95%, severely diseased PDA, distal PLSA occluded filling via L-R collateral; normal LV function; inferior lateral basal akinesis.  . CHOLECYSTECTOMY  2012  . EYE SURGERY  2010   cataract - right, has left done too  . INGUINAL HERNIA REPAIR    . LEFT HEART CATHETERIZATION WITH CORONARY ANGIOGRAM N/A 01/28/2012   Procedure: LEFT HEART CATHETERIZATION WITH CORONARY ANGIOGRAM;  Surgeon:  Iran OuchMuhammad A Arida, MD;  Location: [facility] CATH LAB;  Service: Cardiovascular;  Laterality: N/A;  . LEFT HEART CATHETERIZATION WITH CORONARY ANGIOGRAM N/A 03/17/2014   Procedure: LEFT HEART CATHETERIZATION WITH CORONARY ANGIOGRAM;  Surgeon: Kathleene Hazelhristopher D McAlhany, MD;  Location[facility] C[facility];  Service: Cardiovascular;  Laterality: N/A;  . ORIF TIBIA & FIBULA FRACTURES    . PTCA    . VASECTOMY          Home Medications    Prior to Admission  medications   Medication Sig Start Date End Date Taking? Authorizing Provider  acetaminophen (TYLENOL) 500 MG tablet Take 2 tablets (1,000 mg total) by mouth every 8 (eight) hours for 14 days. 12/16/18 12/30/18  Bjorn PippinVarkey, Dax T, MD  albuterol (VENTOLIN HFA) 108 (90 Base) MCG/ACT inhaler Inhale two puffs every four to six hours as needed for cough or wheeze. 06/29/18   Kozlow, Alvira PhilipsEric J, MD  aspirin EC 81 MG tablet Take 81 mg by mouth daily.    [provider]  celecoxib (CELEBREX) 200 MG capsule Take 1 capsule (200 mg total) by mouth 2 (two) times daily. 12/16/18 01/15/19  Bjorn PippinVarkey, Dax T, MD  fluticasone (FLONASE) 50 MCG/ACT nasal spray Use one to two sprays in each nostril once daily as directed. 06/29/18   Kozlow, Alvira PhilipsEric J, MD  fluticasone (FLOVENT HFA) 110 MCG/ACT inhaler Inhale three puffs three times daily during flare-up.  Rinse, gargle, and spit after use. 06/29/18   Kozlow, Alvira PhilipsEric J, MD  fluticasone furoate-vilanterol (BREO ELLIPTA) 200-25 MCG/INH AEPB Inhale 1 puff into the lungs daily. Rinse, gargle, and spit after use. 06/29/18   Kozlow, Alvira PhilipsEric J, MD  glipiZIDE (GLUCOTROL XL) 2.5 MG 24 hr tablet Take 1 tablet (2.5 mg total) by mouth daily with breakfast. 02/01/18   Gordy SaversKwiatkowski, Peter F, MD  glucose blood (PRECISION XTRA TEST STRIPS) test strip USE TO CHECK BLOOD SUGAR DAILY 04/21/14   Gordy SaversKwiatkowski, Peter F, MD  ipratropium (ATROVENT) 0.06 % nasal spray Can use two sprays in each nostril every six hours if needed to dry up runny nose. 06/29/18   Kozlow, Alvira PhilipsEric J, MD  isosorbide mononitrate (IMDUR) 30 MG 24 hr tablet TAKE ONE TABLET BY MOUTH TWICE DAILY 06/30/16   Gordy SaversKwiatkowski, Peter F, MD  levothyroxine (SYNTHROID, LEVOTHROID) 50 MCG tablet Take 1 tablet (50 mcg total) by mouth daily. 01/26/17   Gordy SaversKwiatkowski, Peter F, MD  metFORMIN (GLUCOPHAGE) 1000 MG tablet Take 1 tablet (1,000 mg total) by mouth 2 (two) times daily with a meal. 07/21/18   Philip AspenHernandez Acosta, Limmie PatriciaEstela Y, MD  metoprolol tartrate (LOPRESSOR) 50 MG  tablet Take 1 tablet (50 mg total) by mouth 2 (two) times daily. 07/14/18   Philip AspenHernandez Acosta, Limmie PatriciaEstela Y, MD  montelukast (SINGULAIR) 10 MG tablet Take 1 tablet (10 mg total) by mouth daily. 06/29/18   Kozlow, Alvira PhilipsEric J, MD  Multiple Vitamins-Minerals (CENTRUM ADULTS PO) Take by mouth.    [provider]  nitroGLYCERIN (NITROSTAT) 0.4 MG SL tablet Place 1 tablet (0.4 mg total) under the tongue every 5 (five) minutes as needed. Chest pain 11/21/13   Gordy SaversKwiatkowski, Peter F, MD  ondansetron (ZOFRAN) 4 MG tablet Take 1 tablet (4 mg total) by mouth every 8 (eight) hours as needed for up to 7 days for nausea or vomiting. 12/16/18 12/23/18  Bjorn PippinVarkey, Dax T, MD  oxyCODONE (OXY IR/ROXICODONE) 5 MG immediate release tablet Take 1-2 pills every 6 hrs as needed for pain, no more than 6 per day 12/16/18 12/21/18  Bjorn PippinVarkey, Dax T,  MD  pantoprazole (PROTONIX) 40 MG tablet Take 40 mg by mouth daily.    [provider]  pregabalin (LYRICA) 75 MG capsule Take 1 capsule by mouth twice daily 12/07/18   Philip AspenHernandez Acosta, Limmie PatriciaEstela Y, MD  simvastatin (ZOCOR) 80 MG tablet Take 40 mg by mouth at bedtime. Rx from [facility]    [provider]  tamsulosin (FLOMAX) 0.4 MG CAPS capsule TAKE ONE CAPSULE BY MOUTH DAILY 06/26/14   Gordy SaversKwiatkowski, Peter F, MD  traMADol (ULTRAM) 50 MG tablet Take 50 mg by mouth every 6 (six) hours as needed for pain. for pain 12/03/18   [provider]  traZODone (DESYREL) 50 MG tablet Take 0.5 tablets (25 mg total) by mouth at bedtime as needed for sleep. 04/29/18   Philip AspenHernandez Acosta, Limmie PatriciaEstela Y, MD    Family History Family History  Problem Relation Age of Onset  . Asthma Mother   . Allergies Sister   . Coronary artery disease Other     Social History Social History   Tobacco Use  . Smoking status: Never Smoker  . Smokeless tobacco: Never Used  Substance Use Topics  . Alcohol use: Yes    Alcohol/week: 0.0 standard drinks    Comment: occ, once every 2 months  . Drug use: No      Allergies   Meloxicam   Review of Systems Review of Systems  All other systems reviewed and are negative.    Physical Exam Updated Vital Signs BP 125/82 (BP Location: Right Arm)   Pulse 93   Temp 98.2 F (36.8 C) (Oral)   Resp 18   SpO2 96%   Physical Exam Vitals signs and nursing note reviewed.  Constitutional:      General: He is not in acute distress.    Appearance: Normal appearance. He is well-developed. He is not toxic-appearing.  HENT:     Head: Normocephalic and atraumatic.  Eyes:     General: Lids are normal.     Conjunctiva/sclera: Conjunctivae normal.     Pupils: Pupils are equal, round, and reactive to light.  Neck:     Musculoskeletal: Normal range of motion and neck supple.     Thyroid: No thyroid mass.     Trachea: No tracheal deviation.  Cardiovascular:     Rate and Rhythm: Normal rate and regular rhythm.     Heart sounds: Normal heart sounds. No murmur. No gallop.   Pulmonary:     Effort: Pulmonary effort is normal. No respiratory distress.     Breath sounds: Decreased air movement present. No stridor. Examination of the right-upper field reveals decreased breath sounds. Examination of the left-upper field reveals decreased breath sounds. Decreased breath sounds present. No wheezing, rhonchi or rales.  Abdominal:     General: Bowel sounds are normal. There is no distension.     Palpations: Abdomen is soft.     Tenderness: There is no abdominal tenderness. There is no rebound.  Musculoskeletal: Normal range of motion.        General: No tenderness.  Skin:    General: Skin is warm and dry.     Findings: No abrasion or rash.  Neurological:     Mental Status: He is alert and oriented to person, place, and time.     GCS: GCS eye subscore is 4. GCS verbal subscore is 5. GCS motor subscore is 6.     Cranial Nerves: No cranial nerve deficit.     Sensory: No sensory deficit.  Psychiatric:  Speech: Speech normal.        Behavior: Behavior normal.       ED Treatments / Results  Labs (all labs ordered are listed, but only abnormal results are displayed) Labs Reviewed  CBC - Abnormal; Notable for the following components:      Result Value   WBC 11.3 (*)    RBC 3.48 (*)    Hemoglobin 11.9 (*)    HCT 35.9 (*)    MCV 103.2 (*)    MCH 34.2 (*)    All other components within normal limits  BASIC METABOLIC PANEL  TROPONIN I (HIGH SENSITIVITY)    EKG EKG Interpretation  Date/Time:  Friday December 17 2018 09:18:01 EDT Ventricular Rate:  98 PR Interval:    QRS Duration: 103 QT Interval:  331 QTC Calculation: 423 R Axis:   -18 Text Interpretation:  Sinus rhythm Inferior infarct, old Anteroseptal infarct, old Lateral leads are also involved Baseline wander in lead(s) V2 V3 V4 No significant change since last tracing Confirmed by Lorre NickAllen, Kenai Fluegel ([id number]) on 12/17/2018 9:21:39 AM Also confirmed by Lorre NickAllen, Glenys Snader ([id number]), editor Barbette Hairassel, [place] [phone]([id number])  on 12/17/2018 10:11:04 AM   Radiology Dg Chest Port 1 View  Result Date: 12/17/2018 CLINICAL DATA:  Chest pain and heaviness in the chest since last night at 9 p.m. EXAM: PORTABLE CHEST 1 VIEW COMPARISON:  10/22/2015. FINDINGS: Poor inspiration. Borderline enlarged cardiac silhouette. Small amount of atelectasis or scarring at the left lung base. Otherwise, clear lungs with normal vascularity. Unremarkable bones. IMPRESSION: Poor inspiration with mild left basilar atelectasis or scarring. Electronically Signed   By: Beckie SaltsSteven  Reid M.D.   On: 12/17/2018 10:01    Procedures Procedures (including critical care time)  Medications Ordered in ED Medications  LORazepam (ATIVAN) injection 1 mg (has no administration in time range)  albuterol (VENTOLIN HFA) 108 (90 Base) MCG/ACT inhaler 2 puff (has no administration in time range)  sodium chloride flush (NS) 0.9 % injection 3 mL (3 mLs Intravenous Given 12/17/18 0941)     Initial Impression / Assessment and Plan / ED Course  I have reviewed the  triage vital signs and the nursing notes.  Pertinent labs & imaging results that were available during my care of the patient were reviewed by me and considered in my medical decision making (see chart for details).        Patient given Ativan as well as Valium for anxiety.  CT the chest was negative for PE but possible pneumonia.  Will place on doxycycline and patient feels well to go home.  Final Clinical Impressions(s) / ED Diagnoses   Final diagnoses:  SOB (shortness of breath)    ED Discharge Orders    None       Lorre NickAllen, Bhavik Cabiness, MD 12/17/18 1254

## 2018-12-17 NOTE — ED Triage Notes (Addendum)
C/O shob and heaviness in his chest that started last night at 9 pm.   Patient had left rotator cuff surgery yesterday.   Patient has used his prescribed medication, albuterol with relief for a short amount of time.   Patient has not taken any other home medications today.    Hx. Asthma and cardiac issues per patient.    A/Ox4 Ambulatory in triage.   Patient called his PCP and told to come to ED to be checked out.

## 2018-12-17 NOTE — ED Notes (Signed)
Patient transported to CT 

## 2018-12-17 NOTE — ED Notes (Signed)
Light green, lavender, dark green, and blue tubes sent to lab.   Call bell and phone at bedside.

## 2018-12-20 ENCOUNTER — Encounter: Payer: Self-pay | Admitting: Internal Medicine

## 2018-12-22 ENCOUNTER — Telehealth: Payer: Self-pay | Admitting: *Deleted

## 2018-12-22 NOTE — Telephone Encounter (Signed)
Left message on machine for patient to give more information. CRM

## 2018-12-23 ENCOUNTER — Encounter: Payer: Self-pay | Admitting: Internal Medicine

## 2018-12-23 ENCOUNTER — Telehealth: Payer: Self-pay | Admitting: Internal Medicine

## 2018-12-23 DIAGNOSIS — M19012 Primary osteoarthritis, left shoulder: Secondary | ICD-10-CM | POA: Diagnosis not present

## 2018-12-23 DIAGNOSIS — E875 Hyperkalemia: Secondary | ICD-10-CM

## 2018-12-23 NOTE — Telephone Encounter (Signed)
Ok to keep appointment on 8/25 unless he wishes to see me sooner.

## 2018-12-23 NOTE — Telephone Encounter (Signed)
Pt called stating he had seen the surgeon, his bandages are removed and he has a 3wk follow up. Pt is requesting to have blood work done with PCP. Please advise.

## 2018-12-23 NOTE — Telephone Encounter (Signed)
Left message on machine for patient to return our call CRM 

## 2018-12-23 NOTE — Telephone Encounter (Signed)
Pt states he mostly just wanted Dr. Jerilee Hoh to know how he was doing after shoulder surgery and if she would like him to F/U with her.  States he is concerned about his K+ level as he has just started eating solid food.  Also wants her to know he had an anxiety attack  1 week ago, took Xanax that "Helped tremendously."   States no further attacks. Reports BS in am 90's, after coffee without sugar up to 130's, "Not sure if that is a concern." States he has appt with surgeon this AM.  CB# [phone]

## 2018-12-23 NOTE — Telephone Encounter (Signed)
This encounter was created in error - please disregard.

## 2018-12-24 NOTE — Telephone Encounter (Signed)
Not sure what he wants....maybe schedule him for an appointment?

## 2018-12-24 NOTE — Telephone Encounter (Signed)
Spoke with patient and lab appointment scheduled.  

## 2018-12-28 ENCOUNTER — Ambulatory Visit: Payer: Medicare Other | Admitting: Allergy and Immunology

## 2018-12-28 ENCOUNTER — Other Ambulatory Visit: Payer: Self-pay

## 2018-12-28 VITALS — BP 112/68 | HR 58 | Temp 96.8°F | Resp 16

## 2018-12-28 DIAGNOSIS — K219 Gastro-esophageal reflux disease without esophagitis: Secondary | ICD-10-CM | POA: Diagnosis not present

## 2018-12-28 DIAGNOSIS — J3089 Other allergic rhinitis: Secondary | ICD-10-CM

## 2018-12-28 DIAGNOSIS — J4540 Moderate persistent asthma, uncomplicated: Secondary | ICD-10-CM

## 2018-12-28 MED ORDER — BREO ELLIPTA 200-25 MCG/INH IN AEPB: 1.0000 | INHALATION_SPRAY | Freq: Every day | RESPIRATORY_TRACT | 1 refills | Status: DC

## 2018-12-28 MED ORDER — IPRATROPIUM BROMIDE 0.06 % NA SOLN: NASAL | 5 refills | Status: DC

## 2018-12-28 MED ORDER — ALBUTEROL SULFATE HFA 108 (90 BASE) MCG/ACT IN AERS: INHALATION_SPRAY | RESPIRATORY_TRACT | 1 refills | Status: DC

## 2018-12-28 NOTE — Progress Notes (Signed)
[place]   Follow-up Note  Referring Provider: Shirline FreesNafziger, Cory, NP Primary Provider: Philip AspenHernandez Acosta, Limmie PatriciaEstela Y, MD Date of Office Visit: 12/28/2018  Subjective:   Richard Ho (DOB: 02/08/1941) is a 78 y.o. male who [facility] in re-evaluation of the following:  HPI: [name] returns to this clinic in reevaluation of asthma and allergic rhinoconjunctivitis and a history of reflux.  I last saw him in his clinic on 29 June 2018.  He has really done well with his airway and has not required a systemic steroid or an antibiotic for any type of airway issue and rarely uses a short acting bronchodilator less than 1 time per week.  He continues to use Breo on a consistent basis.  He has had very little issues with his nose while using nasal fluticasone and nasal ipratropium.  He believes that his reflux is doing very well at this point in time on Protonix.  Apparently he is followed by a nephrologist for renal insufficiency but he tells me that his creatinine was 1.7 MG/DL with his last reading.  Recently he had left rotator cuff surgery and apparently had some problems postop with throat tightening for which he apparently had a panic attack and underwent emergency room evaluation and from what I can understand was given doxycycline for a "possible pneumonia".  In any regard this has completely resolved and his breathing is doing very well at this point.  Allergies as of 12/28/2018      Reactions   Meloxicam Other (See Comments)   "jumping out of my skin" Pt feels anxiety attacks when on this medicine      Medication List      acetaminophen 500 MG tablet Commonly known as: TYLENOL Take 2 tablets (1,000 mg total) by mouth every 8 (eight) hours for 14 days.   albuterol 108 (90 Base) MCG/ACT inhaler Commonly known as: Ventolin HFA Inhale two puffs every four to six hours as needed for cough  or wheeze.   ALPRAZolam 0.25 MG tablet Commonly known as: XANAX Take 1 tablet (0.25 mg total) by mouth 3 (three) times daily as needed for anxiety.   aspirin EC 81 MG tablet Take 81 mg by mouth daily.   celecoxib 200 MG capsule Commonly known as: CeleBREX Take 1 capsule (200 mg total) by mouth 2 (two) times daily.   CENTRUM ADULTS PO Take by mouth.   doxycycline 100 MG capsule Commonly known as: VIBRAMYCIN Take 1 capsule (100 mg total) by mouth 2 (two) times daily.   fluticasone 110 MCG/ACT inhaler Commonly known as: Flovent HFA Inhale three puffs three times daily during flare-up.  Rinse, gargle, and spit after use.   fluticasone 50 MCG/ACT nasal spray Commonly known as: FLONASE Use one to two sprays in each nostril once daily as directed.   fluticasone furoate-vilanterol 200-25 MCG/INH Aepb Commonly known as: Breo Ellipta Inhale 1 puff into the lungs daily. Rinse, gargle, and spit after use.   glipiZIDE 2.5 MG 24 hr tablet Commonly known as: GLUCOTROL XL Take 1 tablet (2.5 mg total) by mouth daily with breakfast.   glucose blood test strip Commonly known as: PRECISION XTRA TEST STRIPS USE TO CHECK BLOOD SUGAR DAILY   ipratropium 0.06 % nasal spray Commonly known as: ATROVENT Can use two sprays in each nostril every six hours if needed to dry up runny nose.   isosorbide mononitrate 30 MG 24 hr  tablet Commonly known as: IMDUR TAKE ONE TABLET BY MOUTH TWICE DAILY   levothyroxine 50 MCG tablet Commonly known as: SYNTHROID Take 1 tablet (50 mcg total) by mouth daily.   metFORMIN 1000 MG tablet Commonly known as: GLUCOPHAGE Take 1 tablet (1,000 mg total) by mouth 2 (two) times daily with a meal.   metoprolol tartrate 50 MG tablet Commonly known as: LOPRESSOR Take 1 tablet (50 mg total) by mouth 2 (two) times daily.   montelukast 10 MG tablet Commonly known as: SINGULAIR Take 1 tablet (10 mg total) by mouth daily.   nitroGLYCERIN 0.4 MG SL tablet Commonly  known as: NITROSTAT Place 1 tablet (0.4 mg total) under the tongue every 5 (five) minutes as needed. Chest pain   oxyCODONE 5 MG immediate release tablet Commonly known as: Oxy IR/ROXICODONE TAKE 1 TABLET BY MOUTH EVERY 6 TO 8 HOURS AS NEEDED FOR PAIN   pantoprazole 40 MG tablet Commonly known as: PROTONIX Take 40 mg by mouth daily.   pregabalin 75 MG capsule Commonly known as: LYRICA Take 1 capsule by mouth twice daily   simvastatin 80 MG tablet Commonly known as: ZOCOR Take 40 mg by mouth at bedtime. Rx [facility]   tamsulosin 0.4 MG Caps capsule Commonly known as: FLOMAX TAKE ONE CAPSULE BY MOUTH DAILY   traMADol 50 MG tablet Commonly known as: ULTRAM Take 50 mg by mouth every 6 (six) hours as needed for pain. for pain   traZODone 50 MG tablet Commonly known as: DESYREL Take 0.5 tablets (25 mg total) by mouth at bedtime as needed for sleep.       Past Medical History:  Diagnosis Date  . Asthma   . Coronary artery disease 1991   a) Angioplasty LAD 1991 [place] b) MI in 1995 at  [facility] in [place], med Rx  . Diabetes mellitus   . GERD (gastroesophageal reflux disease)   . Hearing loss   . HTN (hypertension)   . Hyperlipidemia   . Hypothyroidism   . Myocardial infarct (HCC)   . Nephrolithiasis   . Osteopenia   . Snake bite     Past Surgical History:  Procedure Laterality Date  . ANGIOPLASTY  1991  . CARDIAC CATHETERIZATION  2005   Moderate LCx and LAD disease and severe diffuse RCA disease  . CARDIAC CATHETERIZATION  01/2012   normal left main, 50% pLAD stenosis, dLAD mild luminal irregularities, D1 30-40% stenosis at take off; subtotally occluded/severely diseased OM1, mid 80% stenosis in small OM2, long prox and mid RCA stenoses ranging from 60-95%, severely diseased PDA, distal PLSA occluded filling via L-R collateral; normal LV function; inferior lateral basal akinesis.  . CHOLECYSTECTOMY  2012  . EYE SURGERY  2010   cataract - right, has left  done too  . INGUINAL HERNIA REPAIR    . LEFT HEART CATHETERIZATION WITH CORONARY ANGIOGRAM N/A 01/28/2012   Procedure: LEFT HEART CATHETERIZATION WITH CORONARY ANGIOGRAM;  Surgeon: Iran OuchMuhammad A Arida, MD;  [facility]: [facility];  Service: Cardiovascular;  Laterality: N/A;  . LEFT HEART CATHETERIZATION WITH CORONARY ANGIOGRAM N/A 03/17/2014   Procedure: LEFT HEART CATHETERIZATION WITH CORONARY ANGIOGRAM;  Surgeon: Kathleene Hazelhristopher D McAlhany, MD;  [facility];  Service: Cardiovascular;  Laterality: N/A;  . ORIF TIBIA & FIBULA FRACTURES    . PTCA    . SHOULDER ARTHROSCOPY WITH DISTAL CLAVICLE RESECTION Left 12/16/2018   Procedure: LEFT SHOULDER ARTHROSCOPY WITH DISTAL CLAVICLE RESECTION;  Surgeon: Bjorn PippinVarkey, Dax T, MD;  Location: [facility];  Service: Orthopedics;  Laterality: Left;  . SHOULDER ARTHROSCOPY WITH ROTATOR CUFF REPAIR AND SUBACROMIAL DECOMPRESSION Left 12/16/2018   Procedure: LEFT SHOULDER ARTHROSCOPY WITH ROTATOR CUFF REPAIR AND SUBACROMIAL DECOMPRESSION, DEBRIDEMENT;  Surgeon: Bjorn PippinVarkey, Dax T, MD;  Location: [facility];  Service: Orthopedics;  Laterality: Left;  . SHOULDER ARTHROSCOPY WITH SUBACROMIAL DECOMPRESSION AND BICEP TENDON REPAIR Left 12/16/2018   Procedure: SHOULDER ARTHROSCOPY WITH SUBACROMIAL DECOMPRESSION AND BICEP TENDON REPAIR;  Surgeon: Bjorn PippinVarkey, Dax T, MD;  Location: [facility];  Service: Orthopedics;  Laterality: Left;  Marland Kitchen. VASECTOMY      Review of systems negative except as noted in HPI / PMHx or noted below:  Review of Systems  Constitutional: Negative.   HENT: Negative.   Eyes: Negative.   Respiratory: Negative.   Cardiovascular: Negative.   Gastrointestinal: Negative.   Genitourinary: Negative.   Musculoskeletal: Negative.   Skin: Negative.   Neurological: Negative.   Endo/Heme/Allergies: Negative.   Psychiatric/Behavioral: Negative.      Objective:   Vitals:   12/28/18 1024  BP: 112/68  Pulse: (!) 58  Resp: 16   Temp: (!) 96.8 F (36 C)  SpO2: 98%          Physical Exam Constitutional:      Appearance: He is not diaphoretic.     Comments: Left shoulder harness  HENT:     Head: Normocephalic.     Right Ear: Tympanic membrane, ear canal and external ear normal.     Left Ear: Tympanic membrane, ear canal and external ear normal.     Nose: Nose normal. No mucosal edema or rhinorrhea.     Mouth/Throat:     Pharynx: Uvula midline. No oropharyngeal exudate.  Eyes:     Conjunctiva/sclera: Conjunctivae normal.  Neck:     Thyroid: No thyromegaly.     Trachea: Trachea normal. No tracheal tenderness or tracheal deviation.  Cardiovascular:     Rate and Rhythm: Normal rate and regular rhythm.     Heart sounds: Normal heart sounds, S1 normal and S2 normal. No murmur.  Pulmonary:     Effort: No respiratory distress.     Breath sounds: Normal breath sounds. No stridor. No wheezing or rales.  Lymphadenopathy:     Head:     Right side of head: No tonsillar adenopathy.     Left side of head: No tonsillar adenopathy.     Cervical: No cervical adenopathy.  Skin:    Findings: No erythema or rash.     Nails: There is no clubbing.   Neurological:     Mental Status: He is alert.     Diagnostics:    Results of a CT angiography chest obtained 17 December 2018 identified the following:  Cardiovascular: Satisfactory opacification of the pulmonary arteries to the segmental level. No evidence of pulmonary embolism. The heart size is enlarged. No pericardial effusion. Atherosclerosis of the aorta is noted.  Mediastinum/Nodes: No enlarged mediastinal, hilar, or axillary lymph nodes. Thyroid gland, trachea, and esophagus demonstrate no significant findings.  Lungs/Pleura: Consolidation of the left lower lobe with volume loss is identified. There is also consolidation of the posteroinferior left upper lobe. Mild linear atelectasis of the right lung base is noted. Minimal bilateral pleural effusions  are identified.  Assessment and Plan:   1. Asthma, moderate persistent, well-controlled   2. Other allergic rhinitis   3. Gastroesophageal reflux disease, esophagitis presence not specified     1. Continue to Perform Allergen avoidance measures  2. Continue to Treat and prevent inflammation:   A. Breo 200 - one inhalation 1 time per day.    B. Flonase - 1-2 sprays each nostril one time per day  3. If needed:   A. Proventil or Ventolin HFA 2 puffs every 4-6 hours  B. OTC antihistamine - Claritin/Zyrtec/Allegra  C. nasal ipratropium 0.06% 2 sprays each nostril every 6 hours  D. Patanol - 1 drop each eye 1-2 times per day  4. "Action plan" for asthma flare up:   A. continue Breo one time a day  B. add Flovent 110 3 inhalations 3 times a day with spacer  C. use Proventil or Ventolin HFA if needed  5. Continue treatment for reflux with Protonix 40 mg daily  6. Return to clinic in 6 months or earlier if problem  7.  Obtain flu vaccine (and COVID vaccine)  Kenan appears to be doing very well with a combination of anti-inflammatory agents for his airway and therapy directed against reflux.  He will continue on this plan and he has several medications that he can use on an as-needed basis.  Assuming he does well with this plan I will see him back in this clinic in 6 months or earlier if there is a problem.   Allena Katz, MD Allergy / Immunology [place]

## 2018-12-28 NOTE — Patient Instructions (Addendum)
  1. Continue to Perform Allergen avoidance measures  2. Continue to Treat and prevent inflammation:   A. Breo 200 - one inhalation 1 time per day.    B. Flonase - 1-2 sprays each nostril one time per day  3. If needed:   A. Proventil or Ventolin HFA 2 puffs every 4-6 hours  B. OTC antihistamine - Claritin/Zyrtec/Allegra  C. nasal ipratropium 0.06% 2 sprays each nostril every 6 hours  D. Patanol - 1 drop each eye 1-2 times per day  4. "Action plan" for asthma flare up:   A. continue Breo one time a day  B. add Flovent 110 3 inhalations 3 times a day with spacer  C. use Proventil or Ventolin HFA if needed  5. Continue treatment for reflux with Protonix 40 mg daily  6. Return to clinic in 6 months or earlier if problem  7.  Obtain flu vaccine (and COVID vaccine)

## 2018-12-29 ENCOUNTER — Other Ambulatory Visit (INDEPENDENT_AMBULATORY_CARE_PROVIDER_SITE_OTHER): Payer: Medicare Other

## 2018-12-29 ENCOUNTER — Encounter: Payer: Self-pay | Admitting: Internal Medicine

## 2018-12-29 ENCOUNTER — Encounter: Payer: Self-pay | Admitting: Allergy and Immunology

## 2018-12-29 DIAGNOSIS — E1142 Type 2 diabetes mellitus with diabetic polyneuropathy: Secondary | ICD-10-CM

## 2018-12-29 DIAGNOSIS — E875 Hyperkalemia: Secondary | ICD-10-CM

## 2018-12-29 LAB — BASIC METABOLIC PANEL
BUN: 20 mg/dL (ref 6–23)
CO2: 23 mEq/L (ref 19–32)
Calcium: 9.1 mg/dL (ref 8.4–10.5)
Chloride: 103 mEq/L (ref 96–112)
Creatinine, Ser: 1.35 mg/dL (ref 0.40–1.50)
GFR: 51.12 mL/min — ABNORMAL LOW (ref >60.00)
Glucose, Bld: 91 mg/dL (ref 70–99)
Potassium: 4.5 mEq/L (ref 3.5–5.1)
Sodium: 137 mEq/L (ref 135–145)

## 2018-12-31 DIAGNOSIS — M19012 Primary osteoarthritis, left shoulder: Secondary | ICD-10-CM | POA: Diagnosis not present

## 2019-01-01 ENCOUNTER — Other Ambulatory Visit: Payer: Self-pay | Admitting: Internal Medicine

## 2019-01-01 DIAGNOSIS — E1142 Type 2 diabetes mellitus with diabetic polyneuropathy: Secondary | ICD-10-CM

## 2019-01-03 ENCOUNTER — Ambulatory Visit (INDEPENDENT_AMBULATORY_CARE_PROVIDER_SITE_OTHER): Payer: Medicare Other | Admitting: Family Medicine

## 2019-01-03 ENCOUNTER — Other Ambulatory Visit: Payer: Self-pay

## 2019-01-03 ENCOUNTER — Encounter: Payer: Self-pay | Admitting: Family Medicine

## 2019-01-03 DIAGNOSIS — R600 Localized edema: Secondary | ICD-10-CM | POA: Diagnosis not present

## 2019-01-03 NOTE — Progress Notes (Signed)
Virtual Visit via Video Note  I connected with Gus HeightLaurence S. Axford on 01/03/19 at  9:30 AM EDT by a video enabled telemedicine application and verified that I am speaking with the correct person using two identifiers.  Location patient: home Location provider:work or home office Persons participating in the virtual visit: patient, provider.  Pt's son nearby.  I discussed the limitations of evaluation and management by telemedicine and the availability of in person appointments. The patient expressed understanding and agreed to proceed.   HPI: Pt is a 78 yo male with pmh sig for HLD, cataract, dry eye, retinal detacment, CKD III, DM II with peripheral neuropathy, GERD, asthma, HTN, h/o MI who is seen by Dr. Ardyth HarpsHernandez.  Pt had surgery on L rotator cuff 7/9.    Pt states he started noticing b/l LE x 1.5 wks ago.  Now with mild edema up to mid shin.  Shoes feel tight when he puts them on.  Pt denies leg pain, SOB, DOE, CP, calf pain, edema or erythema.  Pt endorses doing a lot sitting.  States edema is better at night/in am when in bed.  Pt with decreased appetite.  Eating oatmeal, chicken, fish, some red meat.  Pt endorses having recent labs.  States his kidney funct has improved.  Pt notes not drinking as much water as he probably should.   ROS: See pertinent positives and negatives per HPI.  Past Medical History:  Diagnosis Date  . Asthma   . Coronary artery disease 1991   a) Angioplasty LAD 1991 [place] b) MI in 1995 at  [facility] in [place], med Rx  . Diabetes mellitus   . GERD (gastroesophageal reflux disease)   . Hearing loss   . HTN (hypertension)   . Hyperlipidemia   . Hypothyroidism   . Myocardial infarct (HCC)   . Nephrolithiasis   . Osteopenia   . Snake bite     Past Surgical History:  Procedure Laterality Date  . ANGIOPLASTY  1991  . CARDIAC CATHETERIZATION  2005   Moderate LCx and LAD disease and severe diffuse RCA disease  . CARDIAC CATHETERIZATION  01/2012    normal left main, 50% pLAD stenosis, dLAD mild luminal irregularities, D1 30-40% stenosis at take off; subtotally occluded/severely diseased OM1, mid 80% stenosis in small OM2, long prox and mid RCA stenoses ranging from 60-95%, severely diseased PDA, distal PLSA occluded filling via L-R collateral; normal LV function; inferior lateral basal akinesis.  . CHOLECYSTECTOMY  2012  . EYE SURGERY  2010   cataract - right, has left done too  . INGUINAL HERNIA REPAIR    . LEFT HEART CATHETERIZATION WITH CORONARY ANGIOGRAM N/A 01/28/2012   Procedure: LEFT HEART CATHETERIZATION WITH CORONARY ANGIOGRAM;  Surgeon: Iran OuchMuhammad A Arida, MD;  [facility]: [facility];  Service: Cardiovascular;  Laterality: N/A;  . LEFT HEART CATHETERIZATION WITH CORONARY ANGIOGRAM N/A 03/17/2014   Procedure: LEFT HEART CATHETERIZATION WITH CORONARY ANGIOGRAM;  Surgeon: Kathleene Hazelhristopher D McAlhany, MD;  [facility];  Service: Cardiovascular;  Laterality: N/A;  . ORIF TIBIA & FIBULA FRACTURES    . PTCA    . SHOULDER ARTHROSCOPY WITH DISTAL CLAVICLE RESECTION Left 12/16/2018   Procedure: LEFT SHOULDER ARTHROSCOPY WITH DISTAL CLAVICLE RESECTION;  Surgeon: Bjorn PippinVarkey, Dax T, MD;  Location: [facility];  Service: Orthopedics;  Laterality: Left;  . SHOULDER ARTHROSCOPY WITH ROTATOR CUFF REPAIR AND SUBACROMIAL DECOMPRESSION Left 12/16/2018   Procedure: LEFT SHOULDER ARTHROSCOPY WITH ROTATOR CUFF REPAIR AND SUBACROMIAL DECOMPRESSION, DEBRIDEMENT;  Surgeon: Hiram Gash, MD;  Location: [place];  Service: Orthopedics;  Laterality: Left;  . SHOULDER ARTHROSCOPY WITH SUBACROMIAL DECOMPRESSION AND BICEP TENDON REPAIR Left 12/16/2018   Procedure: SHOULDER ARTHROSCOPY WITH SUBACROMIAL DECOMPRESSION AND BICEP TENDON REPAIR;  Surgeon: Hiram Gash, MD;  Location: [place];  Service: Orthopedics;  Laterality: Left;  Marland Kitchen VASECTOMY      Family History  Problem Relation Age of Onset  . Asthma Mother   .  Allergies Sister   . Coronary artery disease Other      Current Outpatient Medications:  .  albuterol (VENTOLIN HFA) 108 (90 Base) MCG/ACT inhaler, Inhale two puffs every four to six hours as needed for cough or wheeze., Disp: 18 g, Rfl: 1 .  ALPRAZolam (XANAX) 0.25 MG tablet, Take 1 tablet (0.25 mg total) by mouth 3 (three) times daily as needed for anxiety., Disp: 12 tablet, Rfl: 0 .  aspirin EC 81 MG tablet, Take 81 mg by mouth daily., Disp: , Rfl:  .  celecoxib (CELEBREX) 200 MG capsule, Take 1 capsule (200 mg total) by mouth 2 (two) times daily., Disp: 60 capsule, Rfl: 1 .  doxycycline (VIBRAMYCIN) 100 MG capsule, Take 1 capsule (100 mg total) by mouth 2 (two) times daily., Disp: 20 capsule, Rfl: 0 .  fluticasone (FLONASE) 50 MCG/ACT nasal spray, Use one to two sprays in each nostril once daily as directed., Disp: 48 g, Rfl: 1 .  fluticasone (FLOVENT HFA) 110 MCG/ACT inhaler, Inhale three puffs three times daily during flare-up.  Rinse, gargle, and spit after use., Disp: 3 Inhaler, Rfl: 1 .  fluticasone furoate-vilanterol (BREO ELLIPTA) 200-25 MCG/INH AEPB, Inhale 1 puff into the lungs daily. Rinse, gargle, and spit after use., Disp: 180 each, Rfl: 1 .  glipiZIDE (GLUCOTROL XL) 2.5 MG 24 hr tablet, Take 1 tablet (2.5 mg total) by mouth daily with breakfast., Disp: 90 tablet, Rfl: 3 .  glucose blood (PRECISION XTRA TEST STRIPS) test strip, USE TO CHECK BLOOD SUGAR DAILY, Disp: 100 each, Rfl: 3 .  ipratropium (ATROVENT) 0.06 % nasal spray, Can use two sprays in each nostril every six hours if needed to dry up runny nose., Disp: 45 mL, Rfl: 5 .  isosorbide mononitrate (IMDUR) 30 MG 24 hr tablet, TAKE ONE TABLET BY MOUTH TWICE DAILY, Disp: 180 tablet, Rfl: 3 .  levothyroxine (SYNTHROID, LEVOTHROID) 50 MCG tablet, Take 1 tablet (50 mcg total) by mouth daily., Disp: 90 tablet, Rfl: 3 .  metFORMIN (GLUCOPHAGE) 1000 MG tablet, Take 1 tablet (1,000 mg total) by mouth 2 (two) times daily with a meal.,  Disp: 180 tablet, Rfl: 1 .  metoprolol tartrate (LOPRESSOR) 50 MG tablet, Take 1 tablet (50 mg total) by mouth 2 (two) times daily., Disp: 180 tablet, Rfl: 3 .  montelukast (SINGULAIR) 10 MG tablet, Take 1 tablet (10 mg total) by mouth daily., Disp: 90 tablet, Rfl: 1 .  Multiple Vitamins-Minerals (CENTRUM ADULTS PO), Take by mouth., Disp: , Rfl:  .  nitroGLYCERIN (NITROSTAT) 0.4 MG SL tablet, Place 1 tablet (0.4 mg total) under the tongue every 5 (five) minutes as needed. Chest pain, Disp: 25 tablet, Rfl: 3 .  oxyCODONE (OXY IR/ROXICODONE) 5 MG immediate release tablet, TAKE 1 TABLET BY MOUTH EVERY 6 TO 8 HOURS AS NEEDED FOR PAIN, Disp: , Rfl:  .  pantoprazole (PROTONIX) 40 MG tablet, Take 40 mg by mouth daily., Disp: , Rfl:  .  pregabalin (LYRICA) 75 MG capsule, Take 1 capsule by mouth  twice daily, Disp: 30 capsule, Rfl: 0 .  simvastatin (ZOCOR) 80 MG tablet, Take 40 mg by mouth at bedtime. Rx from [facility], Disp: , Rfl:  .  tamsulosin (FLOMAX) 0.4 MG CAPS capsule, TAKE ONE CAPSULE BY MOUTH DAILY, Disp: 30 capsule, Rfl: 5 .  traMADol (ULTRAM) 50 MG tablet, Take 50 mg by mouth every 6 (six) hours as needed for pain. for pain, Disp: , Rfl:  .  traZODone (DESYREL) 50 MG tablet, Take 0.5 tablets (25 mg total) by mouth at bedtime as needed for sleep., Disp: 90 tablet, Rfl: 2  EXAM:  VITALS per patient if applicable:  RR between 12-20 bpm  GENERAL: alert, oriented, appears well and in no acute distress  HEENT: atraumatic, conjunctiva clear, no obvious abnormalities on inspection of external nose and ears  NECK: normal movements of the head and neck  LUNGS: on inspection no signs of respiratory distress, breathing rate appears normal, no obvious gross SOB, gasping or wheezing  CV: no obvious cyanosis.  B/l pedal edema.  MS: L UE in immobilizer sling.  moves all visible extremities without noticeable abnormality  SKIN: healing surgical incision of L shoulder, no drainage or erythema  noted  PSYCH/NEURO: pleasant and cooperative, no obvious depression or anxiety, speech and thought processing grossly intact  ASSESSMENT AND PLAN:  Discussed the following assessment and plan:  Pedal edema  -likely 2/2 increased sitting/decreased activity.  Also consider nutritional causes 2/2 decreased appetite. -discussed elevating LEs when sitting -pt to monitor sodium intake. -consider TED hose or compression socks. -recent BMP reassuring.  Discussed BNP for continued LE edema -given precautions  F/u prn next wk.   I discussed the assessment and treatment plan with the patient. The patient was provided an opportunity to ask questions and all were answered. The patient agreed with the plan and demonstrated an understanding of the instructions.   The patient was advised to call back or seek an in-person evaluation if the symptoms worsen or if the condition fails to improve as anticipated.  Deeann SaintShannon R Trinadee Verhagen, MD

## 2019-01-03 NOTE — Telephone Encounter (Signed)
Pt is calling in to check the status of his pregabalin (LYRICA) he state that he is out and would like to see if he could get it refilled today.  Pt is aware that the provider and CMA is out of the office today and will be back on tomorrow.

## 2019-01-04 MED ORDER — PREGABALIN 75 MG PO CAPS: 75.0000 mg | ORAL_CAPSULE | Freq: Two times a day (BID) | ORAL | 1 refills | Status: DC

## 2019-01-07 ENCOUNTER — Encounter: Payer: Self-pay | Admitting: Internal Medicine

## 2019-01-17 ENCOUNTER — Other Ambulatory Visit: Payer: Self-pay | Admitting: Allergy and Immunology

## 2019-01-31 ENCOUNTER — Telehealth: Payer: Self-pay | Admitting: Internal Medicine

## 2019-01-31 DIAGNOSIS — M75122 Complete rotator cuff tear or rupture of left shoulder, not specified as traumatic: Secondary | ICD-10-CM | POA: Diagnosis not present

## 2019-01-31 DIAGNOSIS — M25612 Stiffness of left shoulder, not elsewhere classified: Secondary | ICD-10-CM | POA: Diagnosis not present

## 2019-01-31 DIAGNOSIS — M6281 Muscle weakness (generalized): Secondary | ICD-10-CM | POA: Diagnosis not present

## 2019-01-31 DIAGNOSIS — M25512 Pain in left shoulder: Secondary | ICD-10-CM | POA: Diagnosis not present

## 2019-01-31 NOTE — Telephone Encounter (Signed)
[name] [employer] of [name]. Is calling because the patient is a Medicare Advantage HMO Enhanced Patient.   Patient saw Dr. Volanda Napoleon Dr. Volanda Napoleon on 01/03/2019-  And virtual visit was filled as a specialist vist.   The previous OV for Dr. Jerilee Hoh based on Dr. Jerilee Hoh NPI number only covers. Medicare supplement members.   Dr. Jerilee Hoh for this patient comes up as non participating provider. Is there a way that Dr. Jerilee Hoh can notifiy BCBS if she plans to be listed a primary care provider? Because she is only listed as a specialisted for Medicare supplement members.  For is considered out of network for HMO members based on her NPI number.  Please advise  [phone] Customer Service   Patient will be billed for DOS 07/14/17 & 10/27/2018 .  Can this be rerun with Dr. Jerilee Hoh as PCP. Or Patient will be billed at out of network rate. Please advise with Patient [phone]

## 2019-01-31 NOTE — Telephone Encounter (Signed)
Routing to coding/billing as we do not handle claim filing in our office.

## 2019-02-01 ENCOUNTER — Ambulatory Visit: Payer: Self-pay | Admitting: Internal Medicine

## 2019-02-03 DIAGNOSIS — M75122 Complete rotator cuff tear or rupture of left shoulder, not specified as traumatic: Secondary | ICD-10-CM | POA: Diagnosis not present

## 2019-02-03 DIAGNOSIS — M25512 Pain in left shoulder: Secondary | ICD-10-CM | POA: Diagnosis not present

## 2019-02-03 DIAGNOSIS — M6281 Muscle weakness (generalized): Secondary | ICD-10-CM | POA: Diagnosis not present

## 2019-02-03 DIAGNOSIS — M25612 Stiffness of left shoulder, not elsewhere classified: Secondary | ICD-10-CM | POA: Diagnosis not present

## 2019-02-03 NOTE — Telephone Encounter (Signed)
Patient calling to check on status of issue with claim. Please advise.

## 2019-02-08 ENCOUNTER — Ambulatory Visit: Payer: Self-pay | Admitting: Internal Medicine

## 2019-02-08 DIAGNOSIS — M25512 Pain in left shoulder: Secondary | ICD-10-CM | POA: Diagnosis not present

## 2019-02-08 DIAGNOSIS — M25612 Stiffness of left shoulder, not elsewhere classified: Secondary | ICD-10-CM | POA: Diagnosis not present

## 2019-02-08 DIAGNOSIS — M75122 Complete rotator cuff tear or rupture of left shoulder, not specified as traumatic: Secondary | ICD-10-CM | POA: Diagnosis not present

## 2019-02-08 DIAGNOSIS — M6281 Muscle weakness (generalized): Secondary | ICD-10-CM | POA: Diagnosis not present

## 2019-02-10 DIAGNOSIS — M25512 Pain in left shoulder: Secondary | ICD-10-CM | POA: Diagnosis not present

## 2019-02-10 DIAGNOSIS — M25612 Stiffness of left shoulder, not elsewhere classified: Secondary | ICD-10-CM | POA: Diagnosis not present

## 2019-02-10 DIAGNOSIS — M6281 Muscle weakness (generalized): Secondary | ICD-10-CM | POA: Diagnosis not present

## 2019-02-10 DIAGNOSIS — M75122 Complete rotator cuff tear or rupture of left shoulder, not specified as traumatic: Secondary | ICD-10-CM | POA: Diagnosis not present

## 2019-02-11 ENCOUNTER — Telehealth: Payer: Self-pay | Admitting: Internal Medicine

## 2019-02-11 NOTE — Telephone Encounter (Signed)
Pt is calling wanting to see if Vita Barley could give him a call back to see why he is getting a statement from [facility] stating that Dr. Jerilee Hoh is a specialist.  He stated that he would like to have a call back before his appointment on 02/16/2019 with Dr. Jerilee Hoh to make sure that the problem has been corrected.

## 2019-02-11 NOTE — Telephone Encounter (Deleted)
Pt is calling

## 2019-02-15 NOTE — Telephone Encounter (Signed)
Spoke with [employer] and they advised Dr. Jerilee Hoh is listed as specialist in their network. Email sent to Orlene Och in credentialing to address and correct.  Spoke with pt and advised this is being worked on, but may take several weeks to correct. Pt aware notes have been added to account to reflect this.   Nothing further needed at this time.

## 2019-02-16 ENCOUNTER — Ambulatory Visit (INDEPENDENT_AMBULATORY_CARE_PROVIDER_SITE_OTHER): Payer: Medicare Other | Admitting: Internal Medicine

## 2019-02-16 ENCOUNTER — Encounter: Payer: Self-pay | Admitting: Internal Medicine

## 2019-02-16 ENCOUNTER — Other Ambulatory Visit: Payer: Self-pay

## 2019-02-16 VITALS — BP 110/70 | Temp 97.7°F | Wt 219.1 lb

## 2019-02-16 DIAGNOSIS — M6281 Muscle weakness (generalized): Secondary | ICD-10-CM | POA: Diagnosis not present

## 2019-02-16 DIAGNOSIS — E785 Hyperlipidemia, unspecified: Secondary | ICD-10-CM | POA: Diagnosis not present

## 2019-02-16 DIAGNOSIS — M25512 Pain in left shoulder: Secondary | ICD-10-CM | POA: Diagnosis not present

## 2019-02-16 DIAGNOSIS — Z23 Encounter for immunization: Secondary | ICD-10-CM | POA: Diagnosis not present

## 2019-02-16 DIAGNOSIS — I10 Essential (primary) hypertension: Secondary | ICD-10-CM

## 2019-02-16 DIAGNOSIS — E1121 Type 2 diabetes mellitus with diabetic nephropathy: Secondary | ICD-10-CM | POA: Diagnosis not present

## 2019-02-16 DIAGNOSIS — R2689 Other abnormalities of gait and mobility: Secondary | ICD-10-CM

## 2019-02-16 DIAGNOSIS — G4700 Insomnia, unspecified: Secondary | ICD-10-CM | POA: Diagnosis not present

## 2019-02-16 DIAGNOSIS — M25612 Stiffness of left shoulder, not elsewhere classified: Secondary | ICD-10-CM | POA: Diagnosis not present

## 2019-02-16 DIAGNOSIS — M75122 Complete rotator cuff tear or rupture of left shoulder, not specified as traumatic: Secondary | ICD-10-CM | POA: Diagnosis not present

## 2019-02-16 DIAGNOSIS — J4520 Mild intermittent asthma, uncomplicated: Secondary | ICD-10-CM

## 2019-02-16 DIAGNOSIS — I2510 Atherosclerotic heart disease of native coronary artery without angina pectoris: Secondary | ICD-10-CM

## 2019-02-16 DIAGNOSIS — H3322 Serous retinal detachment, left eye: Secondary | ICD-10-CM

## 2019-02-16 DIAGNOSIS — E039 Hypothyroidism, unspecified: Secondary | ICD-10-CM

## 2019-02-16 DIAGNOSIS — E669 Obesity, unspecified: Secondary | ICD-10-CM

## 2019-02-16 LAB — POCT GLYCOSYLATED HEMOGLOBIN (HGB A1C): Hemoglobin A1C: 6.3 % — AB (ref 4.0–5.6)

## 2019-02-16 MED ORDER — TRAZODONE HCL 50 MG PO TABS: 50.0000 mg | ORAL_TABLET | Freq: Every evening | ORAL | 0 refills | Status: DC | PRN

## 2019-02-16 NOTE — Addendum Note (Signed)
Addended by: Westley Hummer B on: 02/16/2019 11:38 AM   Modules accepted: Orders

## 2019-02-16 NOTE — Progress Notes (Signed)
Established Patient Office Visit     CC/Reason for Visit: 68-month follow-up of chronic conditions  HPI: Richard Ho is a 78 y.o. male who is coming in today for the above mentioned reasons. Past Medical History is significant for:   1.  Type 2 diabetes, his last A1c was 7.2 in February, he states his CBGs at home range between 107 and 122, he had one low last week at 10 but that is because he was doing yard work and did not eat.  2.  Hypertension this has been well controlled at home.  3.  Diabetic neuropathy, pain is improved since starting Lyrica last visit, however he has had increasing numbness and issues with balance.  4.  Hypothyroidism/TSH was 4.390 in February, he continues on Synthroid.  5.  Asthma this appears to be moderately well controlled on ipratropium and as needed albuterol, he tells me he has been seeing an allergist.  6.  Left retinal detachment in 2019, followed by ophthalmology, still has some visual floaters but he is overall happy with the results of his surgery.  7.  Coronary artery disease this has been stable and asymptomatic, he follows routinely with cardiology.  8.  Hyperlipidemia, last LDL was 71 in February he is on a statin.  Since I last saw him he had a left rotator cuff surgery, is currently undergoing PT, states it still hurts.  He has also been having issues with insomnia.  He has been prescribed 50 mg of trazodone and has been taking half a tablet at bedtime, is only sleeping 4 to 5 hours at night.  He states he routinely will wake up at 2 or 3:00 in the morning and is unable to go back to sleep.  He has also been having issues with his balance.  He does have a history of diabetic neuropathy.    Past Medical/Surgical History: Past Medical History:  Diagnosis Date   Asthma    Coronary artery disease 1991   a) Angioplasty LAD 67 [place] b) MI in 1995 at  [facility] in [place], med Rx   Diabetes mellitus    GERD  (gastroesophageal reflux disease)    Hearing loss    HTN (hypertension)    Hyperlipidemia    Hypothyroidism    Myocardial infarct (HCC)    Nephrolithiasis    Osteopenia    Snake bite     Past Surgical History:  Procedure Laterality Date   ANGIOPLASTY  1991   CARDIAC CATHETERIZATION  2005   Moderate LCx and LAD disease and severe diffuse RCA disease   CARDIAC CATHETERIZATION  01/2012   normal left main, 50% pLAD stenosis, dLAD mild luminal irregularities, D1 30-40% stenosis at take off; subtotally occluded/severely diseased OM1, mid 80% stenosis in small OM2, long prox and mid RCA stenoses ranging from 60-95%, severely diseased PDA, distal PLSA occluded filling via L-R collateral; normal LV function; inferior lateral basal akinesis.   CHOLECYSTECTOMY  2012   EYE SURGERY  2010   cataract - right, has left done too   INGUINAL HERNIA REPAIR     LEFT HEART CATHETERIZATION WITH CORONARY ANGIOGRAM N/A 01/28/2012   Procedure: LEFT HEART CATHETERIZATION WITH CORONARY ANGIOGRAM;  Surgeon: Iran Ouch, MD;  Location: [facility];  Service: Cardiovascular;  Laterality: N/A;   LEFT HEART CATHETERIZATION WITH CORONARY ANGIOGRAM N/A 03/17/2014   Procedure: LEFT HEART CATHETERIZATION WITH CORONARY ANGIOGRAM;  Surgeon: Kathleene Hazel, MD;  Location: [facility] CATH LAB;  Service: Cardiovascular;  Laterality: N/A;   ORIF TIBIA & FIBULA FRACTURES     PTCA     SHOULDER ARTHROSCOPY WITH DISTAL CLAVICLE RESECTION Left 12/16/2018   Procedure: LEFT SHOULDER ARTHROSCOPY WITH DISTAL CLAVICLE RESECTION;  Surgeon: Bjorn PippinVarkey, Dax T, MD;  Location: [facility];  Service: Orthopedics;  Laterality: Left;   SHOULDER ARTHROSCOPY WITH ROTATOR CUFF REPAIR AND SUBACROMIAL DECOMPRESSION Left 12/16/2018   Procedure: LEFT SHOULDER ARTHROSCOPY WITH ROTATOR CUFF REPAIR AND SUBACROMIAL DECOMPRESSION, DEBRIDEMENT;  Surgeon: Bjorn PippinVarkey, Dax T, MD;  Location: [facility];  Service:  Orthopedics;  Laterality: Left;   SHOULDER ARTHROSCOPY WITH SUBACROMIAL DECOMPRESSION AND BICEP TENDON REPAIR Left 12/16/2018   Procedure: SHOULDER ARTHROSCOPY WITH SUBACROMIAL DECOMPRESSION AND BICEP TENDON REPAIR;  Surgeon: Bjorn PippinVarkey, Dax T, MD;  Location: [facility];  Service: Orthopedics;  Laterality: Left;   VASECTOMY      Social History:  reports that he has never smoked. He has never used smokeless tobacco. He reports current alcohol use. He reports that he does not use drugs.  Allergies: Allergies  Allergen Reactions   Meloxicam Other (See Comments)    "jumping out of my skin" Pt feels anxiety attacks when on this medicine    Family History:  Family History  Problem Relation Age of Onset   Asthma Mother    Allergies Sister    Coronary artery disease Other      Current Outpatient Medications:    albuterol (VENTOLIN HFA) 108 (90 Base) MCG/ACT inhaler, Inhale two puffs every four to six hours as needed for cough or wheeze., Disp: 18 g, Rfl: 1   ALPRAZolam (XANAX) 0.25 MG tablet, Take 1 tablet (0.25 mg total) by mouth 3 (three) times daily as needed for anxiety., Disp: 12 tablet, Rfl: 0   aspirin EC 81 MG tablet, Take 81 mg by mouth daily., Disp: , Rfl:    fluticasone (FLONASE) 50 MCG/ACT nasal spray, Use one to two sprays in each nostril once daily as directed., Disp: 48 g, Rfl: 1   fluticasone (FLOVENT HFA) 110 MCG/ACT inhaler, Inhale three puffs three times daily during flare-up.  Rinse, gargle, and spit after use., Disp: 3 Inhaler, Rfl: 1   fluticasone furoate-vilanterol (BREO ELLIPTA) 200-25 MCG/INH AEPB, Inhale 1 puff into the lungs daily. Rinse, gargle, and spit after use., Disp: 180 each, Rfl: 1   glipiZIDE (GLUCOTROL XL) 2.5 MG 24 hr tablet, Take 1 tablet (2.5 mg total) by mouth daily with breakfast., Disp: 90 tablet, Rfl: 3   glucose blood (PRECISION XTRA TEST STRIPS) test strip, USE TO CHECK BLOOD SUGAR DAILY, Disp: 100 each, Rfl: 3    ipratropium (ATROVENT) 0.06 % nasal spray, Can use two sprays in each nostril every six hours if needed to dry up runny nose., Disp: 45 mL, Rfl: 5   isosorbide mononitrate (IMDUR) 30 MG 24 hr tablet, TAKE ONE TABLET BY MOUTH TWICE DAILY, Disp: 180 tablet, Rfl: 3   levothyroxine (SYNTHROID, LEVOTHROID) 50 MCG tablet, Take 1 tablet (50 mcg total) by mouth daily., Disp: 90 tablet, Rfl: 3   metFORMIN (GLUCOPHAGE) 1000 MG tablet, Take 1 tablet (1,000 mg total) by mouth 2 (two) times daily with a meal., Disp: 180 tablet, Rfl: 1   metoprolol tartrate (LOPRESSOR) 50 MG tablet, Take 1 tablet (50 mg total) by mouth 2 (two) times daily., Disp: 180 tablet, Rfl: 3   montelukast (SINGULAIR) 10 MG tablet, Take one tablet by mouth once daily as directed., Disp: 90 tablet, Rfl: 1  Multiple Vitamins-Minerals (CENTRUM ADULTS PO), Take by mouth., Disp: , Rfl:    nitroGLYCERIN (NITROSTAT) 0.4 MG SL tablet, Place 1 tablet (0.4 mg total) under the tongue every 5 (five) minutes as needed. Chest pain, Disp: 25 tablet, Rfl: 3   oxyCODONE (OXY IR/ROXICODONE) 5 MG immediate release tablet, TAKE 1 TABLET BY MOUTH EVERY 6 TO 8 HOURS AS NEEDED FOR PAIN, Disp: , Rfl:    pantoprazole (PROTONIX) 40 MG tablet, Take 40 mg by mouth daily., Disp: , Rfl:    pregabalin (LYRICA) 75 MG capsule, Take 1 capsule (75 mg total) by mouth 2 (two) times daily., Disp: 180 capsule, Rfl: 1   simvastatin (ZOCOR) 80 MG tablet, Take 40 mg by mouth at bedtime. [place] [facility], Disp: , Rfl:    tamsulosin (FLOMAX) 0.4 MG CAPS capsule, TAKE ONE CAPSULE BY MOUTH DAILY, Disp: 30 capsule, Rfl: 5   traMADol (ULTRAM) 50 MG tablet, Take 50 mg by mouth every 6 (six) hours as needed for pain. for pain, Disp: , Rfl:    traZODone (DESYREL) 50 MG tablet, Take 1 tablet (50 mg total) by mouth at bedtime as needed for sleep., Disp: 90 tablet, Rfl: 0  Review of Systems:  Constitutional: Denies fever, chills, diaphoresis, appetite change and fatigue.  HEENT:  Denies photophobia, eye pain, redness, hearing loss, ear pain, congestion, sore throat, rhinorrhea, sneezing, mouth sores, trouble swallowing, neck pain, neck stiffness and tinnitus.   Respiratory: Denies SOB, DOE, cough, chest tightness,  and wheezing.   Cardiovascular: Denies chest pain, palpitations and leg swelling.  Gastrointestinal: Denies nausea, vomiting, abdominal pain, diarrhea, constipation, blood in stool and abdominal distention.  Genitourinary: Denies dysuria, urgency, frequency, hematuria, flank pain and difficulty urinating.  Endocrine: Denies: hot or cold intolerance, sweats, changes in hair or nails, polyuria, polydipsia. Musculoskeletal: Denies myalgias, back pain, joint swelling, arthralgias and gait problem.  Skin: Denies pallor, rash and wound.  Neurological: Denies dizziness, seizures, syncope, weakness, light-headedness, numbness and headaches.  Hematological: Denies adenopathy. Easy bruising, personal or family bleeding history  Psychiatric/Behavioral: Denies suicidal ideation, mood changes, confusion, nervousness, sleep disturbance and agitation    Physical Exam: Vitals:   02/16/19 0830  BP: 110/70  Temp: 97.7 F (36.5 C)  TempSrc: Temporal  SpO2: 95%  Weight: 219 lb 1.6 oz (99.4 kg)    Body mass index is 31.44 kg/m.   Constitutional: NAD, calm, comfortable Eyes: PERRL, lids and conjunctivae normal, wears corrective lenses ENMT: Mucous membranes are moist. Respiratory: clear to auscultation bilaterally, no wheezing, no crackles. Normal respiratory effort. No accessory muscle use.  Cardiovascular: Regular rate and rhythm, no murmurs / rubs / gallops. No extremity edema. 2+ pedal pulses. No carotid bruits.  Abdomen: no tenderness, no masses palpated. No hepatosplenomegaly. Bowel sounds positive.  Musculoskeletal: no clubbing / cyanosis. No joint deformity upper and lower extremities. Good ROM, no contractures. Normal muscle tone.  Skin: no rashes, lesions,  ulcers. No induration Neurologic: Grossly intact and nonfocal Psychiatric: Normal judgment and insight. Alert and oriented x 3. Normal mood.    Impression and Plan:  Controlled type 2 diabetes mellitus with diabetic nephropathy, without long-term current use of insulin (HCC)  -Well-controlled with an A1c of 6.3 today, continue current regimen.  Insomnia, unspecified type  -He can try taking a full 50 mg trazodone tablet instead of half and see if this has any bearing on his sleeping issues. -He has been instructed on sleep hygiene techniques.  Dyslipidemia -Last LDL was 71 in February 2020.  Essential hypertension -  Well-controlled, continue current regimen.  Atherosclerosis of native coronary artery of native heart without angina pectoris -Stable, no chest pain, no shortness of breath. -Has follow-up with cardiology in November.  Mild intermittent asthma without complication -Well-controlled without recent flareups.  Acquired hypothyroidism -TSH was 4.390 in February 2020.  Continue levothyroxine.  Left retinal detachment -Has follow-up with ophthalmology soon.  Obesity (BMI 30.0-34.9) -Discussed healthy lifestyle, including increased physical activity and better food choices to promote weight loss.  Balance -He has been having issues with his balance that I suspect are secondary to his diabetic neuropathy. -Have recommended physical therapy for core and hip strength, he is already receiving physical therapy for his shoulder. -He will discuss it with his [facility] doctor as his co-pay is less if ordered through them.     Patient Instructions  -Nice seeing you today!!  -Great job getting your diabetes under control!  -Can try taking a full trazodone at bedtime for your sleeping issues.  -Let us know if you would like [place] schedule PT for your balance issues.  -Schedule 3 month follow up.     Lelon Frohlich, MD [facility]

## 2019-02-16 NOTE — Patient Instructions (Signed)
-  Nice seeing you today!!  -Great job getting your diabetes under control!  -Can try taking a full trazodone at bedtime for your sleeping issues.  -Let us know if you would like [place] schedule PT for your balance issues.  -Schedule 3 month follow up.

## 2019-02-18 DIAGNOSIS — M25512 Pain in left shoulder: Secondary | ICD-10-CM | POA: Diagnosis not present

## 2019-02-18 DIAGNOSIS — M75122 Complete rotator cuff tear or rupture of left shoulder, not specified as traumatic: Secondary | ICD-10-CM | POA: Diagnosis not present

## 2019-02-18 DIAGNOSIS — M25612 Stiffness of left shoulder, not elsewhere classified: Secondary | ICD-10-CM | POA: Diagnosis not present

## 2019-02-18 DIAGNOSIS — M6281 Muscle weakness (generalized): Secondary | ICD-10-CM | POA: Diagnosis not present

## 2019-02-21 DIAGNOSIS — H04123 Dry eye syndrome of bilateral lacrimal glands: Secondary | ICD-10-CM | POA: Diagnosis not present

## 2019-02-22 ENCOUNTER — Ambulatory Visit: Payer: Medicare Other

## 2019-02-23 DIAGNOSIS — M25612 Stiffness of left shoulder, not elsewhere classified: Secondary | ICD-10-CM | POA: Diagnosis not present

## 2019-02-23 DIAGNOSIS — M6281 Muscle weakness (generalized): Secondary | ICD-10-CM | POA: Diagnosis not present

## 2019-02-23 DIAGNOSIS — M75122 Complete rotator cuff tear or rupture of left shoulder, not specified as traumatic: Secondary | ICD-10-CM | POA: Diagnosis not present

## 2019-02-23 DIAGNOSIS — M25512 Pain in left shoulder: Secondary | ICD-10-CM | POA: Diagnosis not present

## 2019-02-25 DIAGNOSIS — M6281 Muscle weakness (generalized): Secondary | ICD-10-CM | POA: Diagnosis not present

## 2019-02-25 DIAGNOSIS — M25612 Stiffness of left shoulder, not elsewhere classified: Secondary | ICD-10-CM | POA: Diagnosis not present

## 2019-02-25 DIAGNOSIS — M25512 Pain in left shoulder: Secondary | ICD-10-CM | POA: Diagnosis not present

## 2019-02-25 DIAGNOSIS — M75122 Complete rotator cuff tear or rupture of left shoulder, not specified as traumatic: Secondary | ICD-10-CM | POA: Diagnosis not present

## 2019-03-02 DIAGNOSIS — M25612 Stiffness of left shoulder, not elsewhere classified: Secondary | ICD-10-CM | POA: Diagnosis not present

## 2019-03-02 DIAGNOSIS — M25512 Pain in left shoulder: Secondary | ICD-10-CM | POA: Diagnosis not present

## 2019-03-02 DIAGNOSIS — M75122 Complete rotator cuff tear or rupture of left shoulder, not specified as traumatic: Secondary | ICD-10-CM | POA: Diagnosis not present

## 2019-03-02 DIAGNOSIS — M6281 Muscle weakness (generalized): Secondary | ICD-10-CM | POA: Diagnosis not present

## 2019-03-07 DIAGNOSIS — M75122 Complete rotator cuff tear or rupture of left shoulder, not specified as traumatic: Secondary | ICD-10-CM | POA: Diagnosis not present

## 2019-03-07 DIAGNOSIS — M25612 Stiffness of left shoulder, not elsewhere classified: Secondary | ICD-10-CM | POA: Diagnosis not present

## 2019-03-07 DIAGNOSIS — M6281 Muscle weakness (generalized): Secondary | ICD-10-CM | POA: Diagnosis not present

## 2019-03-07 DIAGNOSIS — M25512 Pain in left shoulder: Secondary | ICD-10-CM | POA: Diagnosis not present

## 2019-03-08 ENCOUNTER — Other Ambulatory Visit: Payer: Self-pay | Admitting: Internal Medicine

## 2019-03-08 DIAGNOSIS — E1121 Type 2 diabetes mellitus with diabetic nephropathy: Secondary | ICD-10-CM

## 2019-03-10 DIAGNOSIS — M25512 Pain in left shoulder: Secondary | ICD-10-CM | POA: Diagnosis not present

## 2019-03-10 DIAGNOSIS — M75122 Complete rotator cuff tear or rupture of left shoulder, not specified as traumatic: Secondary | ICD-10-CM | POA: Diagnosis not present

## 2019-03-10 DIAGNOSIS — M25612 Stiffness of left shoulder, not elsewhere classified: Secondary | ICD-10-CM | POA: Diagnosis not present

## 2019-03-10 DIAGNOSIS — M6281 Muscle weakness (generalized): Secondary | ICD-10-CM | POA: Diagnosis not present

## 2019-03-14 DIAGNOSIS — M75122 Complete rotator cuff tear or rupture of left shoulder, not specified as traumatic: Secondary | ICD-10-CM | POA: Diagnosis not present

## 2019-03-14 DIAGNOSIS — M25612 Stiffness of left shoulder, not elsewhere classified: Secondary | ICD-10-CM | POA: Diagnosis not present

## 2019-03-14 DIAGNOSIS — M6281 Muscle weakness (generalized): Secondary | ICD-10-CM | POA: Diagnosis not present

## 2019-03-14 DIAGNOSIS — M25512 Pain in left shoulder: Secondary | ICD-10-CM | POA: Diagnosis not present

## 2019-03-17 ENCOUNTER — Encounter: Payer: Self-pay | Admitting: Cardiovascular Disease

## 2019-03-18 DIAGNOSIS — M6281 Muscle weakness (generalized): Secondary | ICD-10-CM | POA: Diagnosis not present

## 2019-03-18 DIAGNOSIS — M25512 Pain in left shoulder: Secondary | ICD-10-CM | POA: Diagnosis not present

## 2019-03-18 DIAGNOSIS — M25612 Stiffness of left shoulder, not elsewhere classified: Secondary | ICD-10-CM | POA: Diagnosis not present

## 2019-03-18 DIAGNOSIS — M75122 Complete rotator cuff tear or rupture of left shoulder, not specified as traumatic: Secondary | ICD-10-CM | POA: Diagnosis not present

## 2019-03-22 DIAGNOSIS — M542 Cervicalgia: Secondary | ICD-10-CM | POA: Diagnosis not present

## 2019-03-25 DIAGNOSIS — M25512 Pain in left shoulder: Secondary | ICD-10-CM | POA: Diagnosis not present

## 2019-03-25 DIAGNOSIS — M75122 Complete rotator cuff tear or rupture of left shoulder, not specified as traumatic: Secondary | ICD-10-CM | POA: Diagnosis not present

## 2019-03-25 DIAGNOSIS — M25612 Stiffness of left shoulder, not elsewhere classified: Secondary | ICD-10-CM | POA: Diagnosis not present

## 2019-03-25 DIAGNOSIS — M6281 Muscle weakness (generalized): Secondary | ICD-10-CM | POA: Diagnosis not present

## 2019-04-01 DIAGNOSIS — D631 Anemia in chronic kidney disease: Secondary | ICD-10-CM | POA: Diagnosis not present

## 2019-04-01 DIAGNOSIS — I129 Hypertensive chronic kidney disease with stage 1 through stage 4 chronic kidney disease, or unspecified chronic kidney disease: Secondary | ICD-10-CM | POA: Diagnosis not present

## 2019-04-01 DIAGNOSIS — N2581 Secondary hyperparathyroidism of renal origin: Secondary | ICD-10-CM | POA: Diagnosis not present

## 2019-04-01 DIAGNOSIS — N189 Chronic kidney disease, unspecified: Secondary | ICD-10-CM | POA: Diagnosis not present

## 2019-04-01 DIAGNOSIS — N1831 Chronic kidney disease, stage 3a: Secondary | ICD-10-CM | POA: Diagnosis not present

## 2019-04-07 ENCOUNTER — Other Ambulatory Visit: Payer: Self-pay | Admitting: Nephrology

## 2019-04-07 DIAGNOSIS — N1831 Chronic kidney disease, stage 3a: Secondary | ICD-10-CM

## 2019-04-15 ENCOUNTER — Encounter

## 2019-04-15 ENCOUNTER — Other Ambulatory Visit: Payer: Self-pay

## 2019-04-15 ENCOUNTER — Encounter: Payer: Self-pay | Admitting: Cardiovascular Disease

## 2019-04-15 ENCOUNTER — Ambulatory Visit: Payer: Medicare Other | Admitting: Cardiovascular Disease

## 2019-04-15 VITALS — BP 130/68 | HR 93 | Ht 70.0 in | Wt 224.2 lb

## 2019-04-15 DIAGNOSIS — I493 Ventricular premature depolarization: Secondary | ICD-10-CM | POA: Diagnosis not present

## 2019-04-15 DIAGNOSIS — I10 Essential (primary) hypertension: Secondary | ICD-10-CM | POA: Diagnosis not present

## 2019-04-15 DIAGNOSIS — I25118 Atherosclerotic heart disease of native coronary artery with other forms of angina pectoris: Secondary | ICD-10-CM

## 2019-04-15 DIAGNOSIS — E7800 Pure hypercholesterolemia, unspecified: Secondary | ICD-10-CM | POA: Diagnosis not present

## 2019-04-15 NOTE — Progress Notes (Signed)
Chief Complaint  Patient presents with  . Follow-up    CAD   History of Present Illness: 78 yo male with history of CAD, HTN, HLD and DM here today for cardiac follow up. He has a history of coronary [place] in [place] and in [place] in the 1990[place]. Last cardiac cath October 2015 with stable moderate CAD. The RCA is diffusely diseased with no focal targets for PCI.  Echo August 2018 with normal LV systolic function, grade 2 diastolic dysfunction and no significant valve disease.     He is here today for follow up. The patient denies any chest pain, dyspnea, palpitations, lower extremity edema, orthopnea, PND, dizziness, near syncope or syncope.   Primary Care Physician: Philip AspenHernandez Acosta, Limmie PatriciaEstela Y, MD  Past Medical History:  Diagnosis Date  . Asthma   . Coronary artery disease 1991   a) Angioplasty LAD 1991 [place] b) MI in 1995 at  [facility] in [place], med Rx  . Diabetes mellitus   . GERD (gastroesophageal reflux disease)   . Hearing loss   . HTN (hypertension)   . Hyperlipidemia   . Hypothyroidism   . Myocardial infarct (HCC)   . Nephrolithiasis   . Osteopenia   . Snake bite     Past Surgical History:  Procedure Laterality Date  . ANGIOPLASTY  1991  . CARDIAC CATHETERIZATION  2005   Moderate LCx and LAD disease and severe diffuse RCA disease  . CARDIAC CATHETERIZATION  01/2012   normal left main, 50% pLAD stenosis, dLAD mild luminal irregularities, D1 30-40% stenosis at take off; subtotally occluded/severely diseased OM1, mid 80% stenosis in small OM2, long prox and mid RCA stenoses ranging from 60-95%, severely diseased PDA, distal PLSA occluded filling via L-R collateral; normal LV function; inferior lateral basal akinesis.  . CHOLECYSTECTOMY  2012  . EYE SURGERY  2010   cataract - right, has left done too  . INGUINAL HERNIA REPAIR    . LEFT HEART CATHETERIZATION WITH CORONARY ANGIOGRAM N/A 01/28/2012   Procedure: LEFT HEART CATHETERIZATION WITH CORONARY  ANGIOGRAM;  Surgeon: Iran OuchMuhammad A Arida, MD;  Location: [facility];  Service: Cardiovascular;  Laterality: N/A;  . LEFT HEART CATHETERIZATION WITH CORONARY ANGIOGRAM N/A 03/17/2014   Procedure: LEFT HEART CATHETERIZATION WITH CORONARY ANGIOGRAM;  Surgeon: Kathleene Hazelhristopher D , MD;  [facility];  Service: Cardiovascular;  Laterality: N/A;  . ORIF TIBIA & FIBULA FRACTURES    . PTCA    . SHOULDER ARTHROSCOPY WITH DISTAL CLAVICLE RESECTION Left 12/16/2018   Procedure: LEFT SHOULDER ARTHROSCOPY WITH DISTAL CLAVICLE RESECTION;  Surgeon: Bjorn PippinVarkey, Dax T, MD;  Location: [facility];  Service: Orthopedics;  Laterality: Left;  . SHOULDER ARTHROSCOPY WITH ROTATOR CUFF REPAIR AND SUBACROMIAL DECOMPRESSION Left 12/16/2018   Procedure: LEFT SHOULDER ARTHROSCOPY WITH ROTATOR CUFF REPAIR AND SUBACROMIAL DECOMPRESSION, DEBRIDEMENT;  Surgeon: Bjorn PippinVarkey, Dax T, MD;  Location: [facility];  Service: Orthopedics;  Laterality: Left;  . SHOULDER ARTHROSCOPY WITH SUBACROMIAL DECOMPRESSION AND BICEP TENDON REPAIR Left 12/16/2018   Procedure: SHOULDER ARTHROSCOPY WITH SUBACROMIAL DECOMPRESSION AND BICEP TENDON REPAIR;  Surgeon: Bjorn PippinVarkey, Dax T, MD;  Location: [facility];  Service: Orthopedics;  Laterality: Left;  Marland Kitchen. VASECTOMY      Current Outpatient Medications  Medication Sig Dispense Refill  . aspirin EC 81 MG tablet Take 81 mg by mouth daily.    . calcium-vitamin D (OSCAL WITH D) 500-200 MG-UNIT tablet Take 1 tablet by mouth.    . ferrous sulfate  325 (65 FE) MG EC tablet Take 325 mg by mouth 3 (three) times daily with meals.    . fluticasone (FLOVENT HFA) 110 MCG/ACT inhaler Inhale three puffs three times daily during flare-up.  Rinse, gargle, and spit after use. 3 Inhaler 1  . fluticasone furoate-vilanterol (BREO ELLIPTA) 200-25 MCG/INH AEPB Inhale 1 puff into the lungs daily. Rinse, gargle, and spit after use. 180 each 1  . isosorbide mononitrate (IMDUR) 30 MG 24 hr tablet TAKE  ONE TABLET BY MOUTH TWICE DAILY 180 tablet 3  . levothyroxine (SYNTHROID, LEVOTHROID) 50 MCG tablet Take 1 tablet (50 mcg total) by mouth daily. 90 tablet 3  . metFORMIN (GLUCOPHAGE) 1000 MG tablet TAKE 1 TABLET BY MOUTH TWICE DAILY WITH MEALS 180 tablet 1  . metoprolol tartrate (LOPRESSOR) 50 MG tablet Take 1 tablet (50 mg total) by mouth 2 (two) times daily. 180 tablet 3  . montelukast (SINGULAIR) 10 MG tablet Take one tablet by mouth once daily as directed. 90 tablet 1  . Multiple Vitamins-Minerals (CENTRUM ADULTS PO) Take by mouth.    . nitroGLYCERIN (NITROSTAT) 0.4 MG SL tablet Place 1 tablet (0.4 mg total) under the tongue every 5 (five) minutes as needed. Chest pain 25 tablet 3  . oxyCODONE (OXY IR/ROXICODONE) 5 MG immediate release tablet TAKE 1 TABLET BY MOUTH EVERY 6 TO 8 HOURS AS NEEDED FOR PAIN    . pantoprazole (PROTONIX) 40 MG tablet Take 40 mg by mouth daily.    . pregabalin (LYRICA) 75 MG capsule Take 1 capsule (75 mg total) by mouth 2 (two) times daily. 180 capsule 1  . simvastatin (ZOCOR) 80 MG tablet Take 40 mg by mouth at bedtime. Rx from [place]    . tamsulosin (FLOMAX) 0.4 MG CAPS capsule TAKE ONE CAPSULE BY MOUTH DAILY 30 capsule 5  . traZODone (DESYREL) 50 MG tablet Take 1 tablet (50 mg total) by mouth at bedtime as needed for sleep. 90 tablet 0   No current facility-administered medications for this visit.     Allergies  Allergen Reactions  . Meloxicam Other (See Comments)    "jumping out of my skin" Pt feels anxiety attacks when on this medicine    Social History   Socioeconomic History  . Marital status: Married    Spouse name: Not on file  . Number of children: Not on file  . Years of education: Not on file  . Highest education level: Not on file  Occupational History  . Occupation: Retired  Engineer, production  . Financial resource strain: Not on file  . Food insecurity    Worry: Not on file    Inability: Not on file  . Transportation needs    Medical: Not on  file    Non-medical: Not on file  Tobacco Use  . Smoking status: Never Smoker  . Smokeless tobacco: Never Used  Substance and Sexual Activity  . Alcohol use: Yes    Alcohol/week: 0.0 standard drinks    Comment: occ, once every 2 months  . Drug use: No  . Sexual activity: Not on file  Lifestyle  . Physical activity    Days per week: Not on file    Minutes per session: Not on file  . Stress: Not on file  Relationships  . Social Musician on phone: Not on file    Gets together: Not on file    Attends religious service: Not on file    Active member of club or organization: Not  on file    Attends meetings of clubs or organizations: Not on file    Relationship status: Not on file  . Intimate partner violence    Fear of current or ex partner: Not on file    Emotionally abused: Not on file    Physically abused: Not on file    Forced sexual activity: Not on file  Other Topics Concern  . Not on file  Social History Narrative  . Not on file    Family History  Problem Relation Age of Onset  . Asthma Mother   . Allergies Sister   . Coronary artery disease Other     Review of Systems:  As stated in the HPI and otherwise negative.   BP 130/68   Pulse 93   Ht 5\' 10"  (1.778 m)   Wt 224 lb 3.2 oz (101.7 kg)   SpO2 98%   BMI 32.17 kg/m   Physical Examination:  General: Well developed, well nourished, NAD  HEENT: OP clear, mucus membranes moist  SKIN: warm, dry. No rashes. Neuro: No focal deficits  Musculoskeletal: Muscle strength 5/5 all ext  Psychiatric: Mood and affect normal  Neck: No JVD, no carotid bruits, no thyromegaly, no lymphadenopathy.  Lungs:Clear bilaterally, no wheezes, rhonci, crackles Cardiovascular: Regular rate and rhythm. No murmurs, gallops or rubs. Abdomen:Soft. Bowel sounds present. Non-tender.  Extremities: No lower extremity edema. Pulses are 2 + in the bilateral DP/PT.  Echo August 2018: Left ventricle: The cavity size was normal.  Wall thickness was   normal. Systolic function was normal. The estimated ejection   fraction was in the range of 55% to 60%. Wall motion was normal;   there were no regional wall motion abnormalities. Features are   consistent with a pseudonormal left ventricular filling pattern,   with concomitant abnormal relaxation and increased filling   pressure (grade 2 diastolic dysfunction).  Cardiac cath 03/17/14: Left Main: No obstructive disease.  Left anterior Descending: Large caliber vessel that courses to the apex. The mid vessel has a 50% stenosis involving the takeoff of both diagonal branches. The distal LAD has minor luminal irregularities. The first diagonal is a moderate caliber vessel with ostial 30-40% stenosis at the ostium.  Left Circumflex: Large vessel that trifurcates quickly into 3 branches. The first OM is small in caliber and is sub-totally occluded proximally, unchanged from last cath. The second OM is small in caliber and has a mid 80% stenosis. The AV groove branch is small and provides collateral flow the the distal RCA.  Right Coronary Artery: Large dominant vessel with proximal 60% stenosis followed by 100% total occlusion in the mid vessel. There are small bridging collaterals that supply the mid and distal vessel. The distal vessel is also seen to fill from left to right collaterals.  Left Ventricular Angiogram: LVEF=45%.   EKG:  EKG is not ordered today. The ekg ordered today demonstrates   Recent Labs: 08/02/2018: TSH 4.39 10/15/2018: Magnesium 1.5 12/17/2018: Hemoglobin 11.9; Platelets 216 12/29/2018: BUN 20; Creatinine, Ser 1.35; Potassium 4.5; Sodium 137   Lipid Panel Lipid Panel     Component Value Date/Time   CHOL 153 08/02/2018   TRIG 175 (A) 08/02/2018   HDL 47 08/02/2018   CHOLHDL 4 02/01/2018 0903   VLDL 37.0 02/01/2018 0903   LDLCALC 71 08/02/2018    Wt Readings from Last 3 Encounters:  04/15/19 224 lb 3.2 oz (101.7 kg)  02/16/19 219 lb 1.6 oz (99.4  kg)  12/16/18 217 lb 6  oz (98.6 kg)     Other studies Reviewed: Additional studies/ records that were reviewed today include: . Review of the above records demonstrates:    Assessment and Plan:   1. CAD with stable angina: He has had mild chest pain with moderate exertion for years. This has not changed. Last cardiac cath in October 2015 with stable moderate CAD. Will continue ASA, statin, beta blocker and Imdur.      2. HTN: BP is well controlled.   3. HLD: Lipids followed at the [place]. Well controlled per pt. Will continue statin  4. PVCs: Continue beta blocker.   Current medicines are reviewed at length with the patient today.  The patient does not have concerns regarding medicines.  The following changes have been made:  no change  Labs/ tests ordered today include:   No orders of the defined types were placed in this encounter.   Disposition:   FU with me in 12  months   Signed, Verne Carrow, MD 04/15/2019 9:51 AM    [facility] [address] [place], [place], [place]  [address] Phone: [phone]; Fax: [phone]

## 2019-04-15 NOTE — Patient Instructions (Signed)
Medication Instructions:  Your physician recommends that you continue on your current medications as directed. Please refer to the Current Medication list given to you today.  *If you need a refill on your cardiac medications before your next appointment, please call your pharmacy*  Lab Work: None ordered  If you have labs (blood work) drawn today and your tests are completely normal, you will receive your results only by: . MyChart Message (if you have MyChart) OR . A paper copy in the mail If you have any lab test that is abnormal or we need to change your treatment, we will call you to review the results.  Testing/Procedures: None ordered  Follow-Up: At [facility], you and your health needs are our priority.  As part of our continuing mission to provide you with exceptional heart care, we have created designated Provider Care Teams.  These Care Teams include your primary Cardiologist (physician) and Advanced Practice Providers (APPs -  Physician Assistants and Nurse Practitioners) who all work together to provide you with the care you need, when you need it.  Your next appointment:   12 month(s)  The format for your next appointment:   In Person  Provider:   You may see Christopher McAlhany, MD or one of the following Advanced Practice Providers on your designated Care Team:    Dayna Dunn, PA-C  Michele Lenze, PA-C   Other Instructions   

## 2019-04-18 ENCOUNTER — Ambulatory Visit
Admission: RE | Admit: 2019-04-18 | Discharge: 2019-04-18 | Disposition: A | Discharge status: Home / Self Care | Payer: Medicare Other | Source: Ambulatory Visit | Attending: Nephrology | Admitting: Nephrology

## 2019-04-18 DIAGNOSIS — N183 Chronic kidney disease, stage 3 unspecified: Secondary | ICD-10-CM | POA: Diagnosis not present

## 2019-04-18 DIAGNOSIS — N1831 Chronic kidney disease, stage 3a: Secondary | ICD-10-CM

## 2019-04-29 DIAGNOSIS — M25512 Pain in left shoulder: Secondary | ICD-10-CM | POA: Diagnosis not present

## 2019-05-04 DIAGNOSIS — Z20828 Contact with and (suspected) exposure to other viral communicable diseases: Secondary | ICD-10-CM | POA: Diagnosis not present

## 2019-05-18 ENCOUNTER — Encounter: Payer: Self-pay | Admitting: Internal Medicine

## 2019-05-18 ENCOUNTER — Other Ambulatory Visit: Payer: Self-pay | Admitting: Internal Medicine

## 2019-05-18 ENCOUNTER — Other Ambulatory Visit: Payer: Self-pay | Admitting: Allergy and Immunology

## 2019-05-18 ENCOUNTER — Other Ambulatory Visit: Payer: Self-pay

## 2019-05-18 ENCOUNTER — Ambulatory Visit (INDEPENDENT_AMBULATORY_CARE_PROVIDER_SITE_OTHER): Payer: Medicare Other | Admitting: Internal Medicine

## 2019-05-18 VITALS — BP 120/80 | HR 73 | Temp 97.8°F | Wt 228.2 lb

## 2019-05-18 DIAGNOSIS — Z0000 Encounter for general adult medical examination without abnormal findings: Secondary | ICD-10-CM

## 2019-05-18 DIAGNOSIS — R2689 Other abnormalities of gait and mobility: Secondary | ICD-10-CM | POA: Diagnosis not present

## 2019-05-18 DIAGNOSIS — E039 Hypothyroidism, unspecified: Secondary | ICD-10-CM | POA: Diagnosis not present

## 2019-05-18 DIAGNOSIS — E1129 Type 2 diabetes mellitus with other diabetic kidney complication: Secondary | ICD-10-CM | POA: Diagnosis not present

## 2019-05-18 DIAGNOSIS — I10 Essential (primary) hypertension: Secondary | ICD-10-CM

## 2019-05-18 DIAGNOSIS — J4520 Mild intermittent asthma, uncomplicated: Secondary | ICD-10-CM

## 2019-05-18 DIAGNOSIS — E785 Hyperlipidemia, unspecified: Secondary | ICD-10-CM

## 2019-05-18 DIAGNOSIS — E559 Vitamin D deficiency, unspecified: Secondary | ICD-10-CM

## 2019-05-18 DIAGNOSIS — E1121 Type 2 diabetes mellitus with diabetic nephropathy: Secondary | ICD-10-CM | POA: Diagnosis not present

## 2019-05-18 DIAGNOSIS — K219 Gastro-esophageal reflux disease without esophagitis: Secondary | ICD-10-CM

## 2019-05-18 DIAGNOSIS — H3322 Serous retinal detachment, left eye: Secondary | ICD-10-CM

## 2019-05-18 DIAGNOSIS — E1142 Type 2 diabetes mellitus with diabetic polyneuropathy: Secondary | ICD-10-CM

## 2019-05-18 DIAGNOSIS — N1832 Chronic kidney disease, stage 3b: Secondary | ICD-10-CM

## 2019-05-18 DIAGNOSIS — I2510 Atherosclerotic heart disease of native coronary artery without angina pectoris: Secondary | ICD-10-CM

## 2019-05-18 LAB — COMPREHENSIVE METABOLIC PANEL
ALT: 19 U/L (ref 0–53)
AST: 16 U/L (ref 0–37)
Albumin: 3.9 g/dL (ref 3.5–5.2)
Alkaline Phosphatase: 80 U/L (ref 39–117)
BUN: 19 mg/dL (ref 6–23)
CO2: 27 mEq/L (ref 19–32)
Calcium: 9 mg/dL (ref 8.4–10.5)
Chloride: 104 mEq/L (ref 96–112)
Creatinine, Ser: 1.39 mg/dL (ref 0.40–1.50)
GFR: 49.38 mL/min — ABNORMAL LOW (ref >60.00)
Glucose, Bld: 96 mg/dL (ref 70–99)
Potassium: 4.5 mEq/L (ref 3.5–5.1)
Sodium: 138 mEq/L (ref 135–145)
Total Bilirubin: 0.6 mg/dL (ref 0.2–1.2)
Total Protein: 6.1 g/dL (ref 6.0–8.3)

## 2019-05-18 LAB — CBC WITH DIFFERENTIAL/PLATELET
Basophils Absolute: 0 10*3/uL (ref 0.0–0.1)
Basophils Relative: 0.6 % (ref 0.0–3.0)
Eosinophils Absolute: 0.1 10*3/uL (ref 0.0–0.7)
Eosinophils Relative: 2 % (ref 0.0–5.0)
HCT: 35.5 % — ABNORMAL LOW (ref 39.0–52.0)
Hemoglobin: 11.7 g/dL — ABNORMAL LOW (ref 13.0–17.0)
Lymphocytes Relative: 27.3 % (ref 12.0–46.0)
Lymphs Abs: 1.8 10*3/uL (ref 0.7–4.0)
MCHC: 33.1 g/dL (ref 30.0–36.0)
MCV: 101.2 fl — ABNORMAL HIGH (ref 78.0–100.0)
Monocytes Absolute: 0.9 10*3/uL (ref 0.1–1.0)
Monocytes Relative: 13.1 % — ABNORMAL HIGH (ref 3.0–12.0)
Neutro Abs: 3.7 10*3/uL (ref 1.4–7.7)
Neutrophils Relative %: 57 % (ref 43.0–77.0)
Platelets: 229 10*3/uL (ref 150.0–400.0)
RBC: 3.5 Mil/uL — ABNORMAL LOW (ref 4.22–5.81)
RDW: 13.9 % (ref 11.5–15.5)
WBC: 6.5 10*3/uL (ref 4.0–10.5)

## 2019-05-18 LAB — LIPID PANEL
Cholesterol: 131 mg/dL (ref 0–200)
HDL: 46.4 mg/dL (ref >39.00)
LDL Cholesterol: 60 mg/dL (ref 0–99)
NonHDL: 84.11
Total CHOL/HDL Ratio: 3
Triglycerides: 119 mg/dL (ref 0.0–149.0)
VLDL: 23.8 mg/dL (ref 0.0–40.0)

## 2019-05-18 LAB — TSH: TSH: 6.09 u[IU]/mL — ABNORMAL HIGH (ref 0.35–4.50)

## 2019-05-18 LAB — VITAMIN D 25 HYDROXY (VIT D DEFICIENCY, FRACTURES): VITD: 21.68 ng/mL — ABNORMAL LOW (ref 30.00–100.00)

## 2019-05-18 LAB — VITAMIN B12: Vitamin B-12: 227 pg/mL (ref 211–911)

## 2019-05-18 LAB — HEMOGLOBIN A1C: Hgb A1c MFr Bld: 7 % — ABNORMAL HIGH (ref 4.6–6.5)

## 2019-05-18 MED ORDER — LEVOTHYROXINE SODIUM 75 MCG PO TABS: 75.0000 ug | ORAL_TABLET | Freq: Every day | ORAL | 1 refills | Status: DC

## 2019-05-18 MED ORDER — VITAMIN D (ERGOCALCIFEROL) 1.25 MG (50000 UNIT) PO CAPS: 50000.0000 [IU] | ORAL_CAPSULE | ORAL | 0 refills | Status: AC

## 2019-05-18 NOTE — Progress Notes (Signed)
Established Patient Office Visit     This visit occurred during the SARS-CoV-2 public health emergency.  Safety protocols were in place, including screening questions prior to the visit, additional usage of staff PPE, and extensive cleaning of exam room while observing appropriate contact time as indicated for disinfecting solutions.    CC/Reason for Visit: 71-monthfollow-up chronic conditions  HPI: Richard STOFKOis a 78y.o. male who is coming in today for the above mentioned reasons. Past Medical History is significant for:    1. Type 2 diabetes, his last A1c was 7.2 in February, he states his CBGs at home range between 107 and 122, he had one low last week at 5105but that is because he was doing yard work and did not eat.  2. Hypertension this has been well controlled at home.  3. Diabetic neuropathy, pain is improved since starting Lyrica last visit, however he has had increasing numbness and issues with balance.  4. Hypothyroidism/TSH was 4.390 in February, he continues on Synthroid.  5. Asthma this appears to be moderately well controlled on ipratropium and as needed albuterol, he tells me he has been seeing an allergist.  6. Left retinal detachment in 2019, followed by ophthalmology, still has some visual floaters but he is overall happy with the results of his surgery.  7. Coronary artery disease this has been stable and asymptomatic, he follows routinely with cardiology.  8. Hyperlipidemia, last LDL was 71 in February he is on a statin.  His main complaint today is continued issues with his balance.  He also wants to let me know that he stopped taking trazodone.  He had a "psychotic episode" 3 nights ago which was more like a nightmare and the left and very anxious he is no longer interested in any sleeping aids.  [place] [facility] [place] I do not have the official results but he was told that it was normal with the  exception of an old stroke that "should not have caused balance issues".  He will forward me a copy [place]  He has routine eye and dental care, he wears a hearing aid in his left ear but hears really well, he walks every day.  Past Medical/Surgical History: Past Medical History:  Diagnosis Date  . Asthma   . Coronary artery disease 1991   a) Angioplasty LAD 1991 [place] [facility] [place] [place] [facility]  . Diabetes mellitus   . GERD (gastroesophageal reflux disease)   . Hearing loss   . HTN (hypertension)   . Hyperlipidemia   . Hypothyroidism   . [place]ocardial infarct ([place]   . Nephrolithiasis   . Osteopenia   . Snake bite     Past Surgical History:  Procedure Laterality Date  . ANGIOPLASTY  1991  . CARDIAC CATHETERIZATION  2005   Moderate LCx and LAD disease and severe diffuse RCA disease  . CARDIAC CATHETERIZATION  01/2012   normal left main, 50% pLAD stenosis, dLAD mild luminal irregularities, D1 30-40% stenosis at take off; subtotally occluded/severely diseased OM1, mid 80% stenosis in small OM2, long prox and mid RCA stenoses ranging from 60-95%, severely diseased PDA, distal PLSA occluded filling via L-R collateral; normal LV function; inferior lateral basal akinesis.  . CHOLECYSTECTOMY  2012  . EYE SURGERY  2010   cataract - right, has left done  too  . INGUINAL HERNIA REPAIR    . LEFT HEART CATHETERIZATION WITH CORONARY ANGIOGRAM N/A 01/28/2012   Procedure: LEFT HEART CATHETERIZATION WITH CORONARY ANGIOGRAM;  Surgeon: [place], MD;  Location: [place] CATH LAB;  Service: Cardiovascular;  Laterality: N/A;  . LEFT HEART CATHETERIZATION WITH CORONARY ANGIOGRAM N/A 03/17/2014   Procedure: LEFT HEART CATHETERIZATION WITH CORONARY ANGIOGRAM;  Surgeon: Burnell Blanks, MD;  Location: [facility] CATH LAB;  Service: Cardiovascular;  Laterality: N/A;  . ORIF TIBIA & FIBULA FRACTURES    . PTCA    . SHOULDER ARTHROSCOPY WITH DISTAL CLAVICLE  RESECTION Left 12/16/2018   Procedure: LEFT SHOULDER ARTHROSCOPY WITH DISTAL CLAVICLE RESECTION;  Surgeon: Hiram Gash, MD;  Location: [place];  Service: Orthopedics;  Laterality: Left;  . SHOULDER ARTHROSCOPY WITH ROTATOR CUFF REPAIR AND SUBACROMIAL DECOMPRESSION Left 12/16/2018   Procedure: LEFT SHOULDER ARTHROSCOPY WITH ROTATOR CUFF REPAIR AND SUBACROMIAL DECOMPRESSION, DEBRIDEMENT;  Surgeon: Hiram Gash, MD;  Location: [place];  Service: Orthopedics;  Laterality: Left;  . SHOULDER ARTHROSCOPY WITH SUBACROMIAL DECOMPRESSION AND BICEP TENDON REPAIR Left 12/16/2018   Procedure: SHOULDER ARTHROSCOPY WITH SUBACROMIAL DECOMPRESSION AND BICEP TENDON REPAIR;  Surgeon: Hiram Gash, MD;  Location: [place];  Service: Orthopedics;  Laterality: Left;  Marland Kitchen VASECTOMY      Social History:  reports that he has never smoked. He has never used smokeless tobacco. He reports current alcohol use. He reports that he does not use drugs.  Allergies: Allergies  Allergen Reactions  . Meloxicam Other (See Comments)    "jumping out of my skin" Pt feels anxiety attacks when on this medicine    Family History:  Family History  Problem Relation Age of Onset  . Asthma Mother   . Allergies Sister   . Coronary artery disease Other      Current Outpatient Medications:  .  aspirin EC 81 MG tablet, Take 81 mg by mouth daily., Disp: , Rfl:  .  calcium-vitamin D (OSCAL WITH D) 500-200 MG-UNIT tablet, Take 1 tablet by mouth., Disp: , Rfl:  .  ferrous sulfate 325 (65 FE) MG EC tablet, Take 325 mg by mouth 3 (three) times daily with meals., Disp: , Rfl:  .  fluticasone (FLOVENT HFA) 110 MCG/ACT inhaler, Inhale three puffs three times daily during flare-up.  Rinse, gargle, and spit after use., Disp: 3 Inhaler, Rfl: 1 .  fluticasone furoate-vilanterol (BREO ELLIPTA) 200-25 MCG/INH AEPB, Inhale 1 puff into the lungs daily. Rinse, gargle, and spit after use., Disp: 180  each, Rfl: 1 .  isosorbide mononitrate (IMDUR) 30 MG 24 hr tablet, TAKE ONE TABLET BY MOUTH TWICE DAILY, Disp: 180 tablet, Rfl: 3 .  levothyroxine (SYNTHROID, LEVOTHROID) 50 MCG tablet, Take 1 tablet (50 mcg total) by mouth daily., Disp: 90 tablet, Rfl: 3 .  metFORMIN (GLUCOPHAGE) 1000 MG tablet, TAKE 1 TABLET BY MOUTH TWICE DAILY WITH MEALS, Disp: 180 tablet, Rfl: 1 .  metoprolol tartrate (LOPRESSOR) 50 MG tablet, Take 1 tablet (50 mg total) by mouth 2 (two) times daily., Disp: 180 tablet, Rfl: 3 .  montelukast (SINGULAIR) 10 MG tablet, Take one tablet by mouth once daily as directed., Disp: 90 tablet, Rfl: 1 .  Multiple Vitamins-Minerals (CENTRUM ADULTS PO), Take by mouth., Disp: , Rfl:  .  nitroGLYCERIN (NITROSTAT) 0.4 MG SL tablet, Place 1 tablet (0.4 mg total) under the tongue every 5 (five) minutes as needed. Chest pain, Disp: 25 tablet, Rfl: 3 .  oxyCODONE (OXY  IR/ROXICODONE) 5 MG immediate release tablet, TAKE 1 TABLET BY MOUTH EVERY 6 TO 8 HOURS AS NEEDED FOR PAIN, Disp: , Rfl:  .  pantoprazole (PROTONIX) 40 MG tablet, Take 40 mg by mouth daily., Disp: , Rfl:  .  pregabalin (LYRICA) 75 MG capsule, Take 1 capsule (75 mg total) by mouth 2 (two) times daily., Disp: 180 capsule, Rfl: 1 .  simvastatin (ZOCOR) 80 MG tablet, Take 40 mg by mouth at bedtime. Rx from [place], Disp: , Rfl:  .  tamsulosin (FLOMAX) 0.4 MG CAPS capsule, TAKE ONE CAPSULE BY MOUTH DAILY, Disp: 30 capsule, Rfl: 5 .  traZODone (DESYREL) 50 MG tablet, Take 1 tablet (50 mg total) by mouth at bedtime as needed for sleep. (Patient not taking: Reported on 05/18/2019), Disp: 90 tablet, Rfl: 0  Review of Systems:  Constitutional: Denies fever, chills, diaphoresis, appetite change and fatigue.  HEENT: Denies photophobia, eye pain, redness, hearing loss, ear pain, congestion, sore throat, rhinorrhea, sneezing, mouth sores, trouble swallowing, neck pain, neck stiffness and tinnitus.   Respiratory: Denies SOB, DOE, cough, chest tightness,   and wheezing.   Cardiovascular: Denies chest pain, palpitations and leg swelling.  Gastrointestinal: Denies nausea, vomiting, abdominal pain, diarrhea, constipation, blood in stool and abdominal distention.  Genitourinary: Denies dysuria, urgency, frequency, hematuria, flank pain and difficulty urinating.  Endocrine: Denies: hot or cold intolerance, sweats, changes in hair or nails, polyuria, polydipsia. Musculoskeletal: Denies myalgias, back pain, joint swelling, arthralgias and gait problem.  Skin: Denies pallor, rash and wound.  Neurological: Denies dizziness, seizures, syncope, weakness, light-headedness and headaches.  Hematological: Denies adenopathy. Easy bruising, personal or family bleeding history  Psychiatric/Behavioral: Denies suicidal ideation, mood changes, confusion, nervousness, sleep disturbance and agitation    Physical Exam: Vitals:   05/18/19 0801  BP: 120/80  Pulse: 73  Temp: 97.8 F (36.6 C)  TempSrc: Temporal  Weight: 228 lb 3.2 oz (103.5 kg)    Body mass index is 32.74 kg/m.   Constitutional: NAD, calm, comfortable Eyes: PERRL, lids and conjunctivae normal ENMT: Mucous membranes are moist.  Tympanic membrane is pearly white, no erythema or bulging. Neck: normal, supple, no masses, no thyromegaly Respiratory: clear to auscultation bilaterally, no wheezing, no crackles. Normal respiratory effort. No accessory muscle use.  Cardiovascular: Regular rate and rhythm, no murmurs / rubs / gallops. No extremity edema. 2+ pedal pulses. No carotid bruits.  Abdomen: no tenderness, no masses palpated. No hepatosplenomegaly. Bowel sounds positive.  Musculoskeletal: no clubbing / cyanosis. No joint deformity upper and lower extremities. Good ROM, no contractures. Normal muscle tone.  Skin: no rashes, lesions, ulcers. No induration Neurologic: CN 2-12 grossly intact. Sensation intact, DTR normal. Strength 5/5 in all 4.  Psychiatric: Normal judgment and insight. Alert and  oriented x 3. Normal mood.   Subsequent Medicare wellness visit   1. Risk factors, based on past  M,S,F -cardiovascular disease risk factors include age, gender, history of hyperlipidemia, history of diabetes, history of coronary artery disease, history of hypertension   2.  Physical activities: He is very physically active walks every day   3.  Depression/mood:  Mood is stable, not depressed   4.  Hearing:  Although he wears a hearing aid in his left ear he feels like it is not a significant impairment   5.  ADL's: Independent in all ADLs   6.  Fall risk:  Moderate fall risk   7.  Home safety: No problems identified   8.  Height weight, and visual acuity: Height  and weight as above, visual acuity is 20/20 of the right eye and 20/30 of the left eye with correction.   9.  Counseling:  Advised follow-up with specialist as recommended   10. Lab orders based on risk factors: Laboratory update will be reviewed   11. Referral :  None today   12. Care plan:  Follow-up with me in 3 to 4 months   13. Cognitive assessment:  No cognitive impairment   14. Screening: Patient provided with a written and personalized 5-10 year screening schedule in the AVS.   yes   15. Provider List Update:   PCP, cardiologist at the [facility]  16. Advance Directives: Full code     Office Visit from 05/18/2019 in [place] at [place]  PHQ-9 Total Score  2      Fall Risk  05/18/2019 05/24/2018 04/29/2018 02/01/2018 12/21/2015  Falls in the past year? 0 0 0 No No  Number falls in past yr: 0 - 0 - -  Injury with Fall? - - 0 - -     Impression and Plan:  Encounter for preventive health examination -He has routine eye and dental care. -Immunizations are up-to-date. -Healthy lifestyle has been discussed in detail. -Screening labs will be performed today. -He had a colonoscopy in 2012 and was told that would be his last colonoscopy. -He had a normal PSA through the [place] in June.  Left retinal  detachment -Followed by ophthalmology.  Morbid obesity ([place]) -Discussed healthy lifestyle, including increased physical activity and better food choices to promote weight loss.  Stage 3b chronic kidney disease -Last creatinine was 1.3 in October 2020, this is followed at the [facility]  Diabetic peripheral neuropathy ([place]) -On Lyrica, improved but suspect balance issues are related to this.  Acquired hypothyroidism - Plan: TSH, TSH  Type 2 diabetes mellitus with diabetic nephropathy, without long-term current use of insulin (HCC)  -Check A1c today with labs, last known A1c was 6.3 in September 2020.  Essential hypertension  -Well-controlled on current regimen.  Dyslipidemia  -Last LDL was 71 in February 2020, recheck lipids today, continue simvastatin 40 mg.  Mild intermittent asthma without complication -Has been using his inhaler a little bit more this fall as is typical for him during ragweed season.  Gastroesophageal reflux disease without esophagitis -Well-controlled on PPI therapy.  Atherosclerosis of native coronary artery of native heart without angina pectoris -Stable, no chest pain[facility]  Balance disorder  - Plan: Vitamin B12, Vitamin B12, Ambulatory referral to Physical Therapy -Suspect likely related to diabetic neuropathy.    Patient Instructions  -Nice seeing you today!!  -Lab work today; will notify you once results are available.  -Will arrange for physical therapy.  -Schedule follow up in 4 months.   Preventive Care 22 Years and Older, Male Preventive care refers to lifestyle choices and visits with your health care provider that can promote health and wellness. This includes:  A yearly physical exam. This is also called an annual well check.  Regular dental and eye exams.  Immunizations.  Screening for certain conditions.  Healthy lifestyle choices, such as diet and exercise. What can I expect for my preventive care visit? Physical  exam Your health care provider will check:  Height and weight. These may be used to calculate body mass index (BMI), which is a measurement that tells if you are at a healthy weight.  Heart rate and blood pressure.  Your skin for abnormal spots. Counseling Your health care provider  may ask you questions about:  Alcohol, tobacco, and drug use.  Emotional well-being.  Home and relationship well-being.  Sexual activity.  Eating habits.  History of falls.  Memory and ability to understand (cognition).  Work and work Statistician. What immunizations do I need?  Influenza (flu) vaccine  This is recommended every year. Tetanus, diphtheria, and pertussis (Tdap) vaccine  You may need a Td booster every 10 years. Varicella (chickenpox) vaccine  You may need this vaccine if you have not already been vaccinated. Zoster (shingles) vaccine  You may need this after age 58. Pneumococcal conjugate (PCV13) vaccine  One dose is recommended after age 36. Pneumococcal polysaccharide (PPSV23) vaccine  One dose is recommended after age 9. Measles, mumps, and rubella (MMR) vaccine  You may need at least one dose of MMR if you were born in 1957 or later. You may also need a second dose. Meningococcal conjugate (MenACWY) vaccine  You may need this if you have certain conditions. Hepatitis A vaccine  You may need this if you have certain conditions or if you travel or work in places where you may be exposed to hepatitis A. Hepatitis B vaccine  You may need this if you have certain conditions or if you travel or work in places where you may be exposed to hepatitis B. Haemophilus influenzae type b (Hib) vaccine  You may need this if you have certain conditions. You may receive vaccines as individual doses or as more than one vaccine together in one shot (combination vaccines). Talk with your health care provider about the risks and benefits of combination vaccines. What tests do I  need? Blood tests  Lipid and cholesterol levels. These may be checked every 5 years, or more frequently depending on your overall health.  Hepatitis C test.  Hepatitis B test. Screening  Lung cancer screening. You may have this screening every year starting at age 59 if you have a 30-pack-year history of smoking and currently smoke or have quit within the past 15 years.  Colorectal cancer screening. All adults should have this screening starting at age 2 and continuing until age 44. Your health care provider may recommend screening at age 65 if you are at increased risk. You will have tests every 1-10 years, depending on your results and the type of screening test.  Prostate cancer screening. Recommendations will vary depending on your family history and other risks.  Diabetes screening. This is done by checking your blood sugar (glucose) after you have not eaten for a while (fasting). You may have this done every 1-3 years.  Abdominal aortic aneurysm (AAA) screening. You may need this if you are a current or former smoker.  Sexually transmitted disease (STD) testing. Follow these instructions at home: Eating and drinking  Eat a diet that includes fresh fruits and vegetables, whole grains, lean protein, and low-fat dairy products. Limit your intake of foods with high amounts of sugar, saturated fats, and salt.  Take vitamin and mineral supplements as recommended by your health care provider.  Do not drink alcohol if your health care provider tells you not to drink.  If you drink alcohol: ? Limit how much you have to 0-2 drinks a day. ? Be aware of how much alcohol is in your drink. In the [place] one drink equals one 12 oz bottle of beer (355 mL), one 5 oz glass of wine (148 mL), or one 1 oz glass of hard liquor (44 mL). Lifestyle  Take daily care of your  teeth and gums.  Stay active. Exercise for at least 30 minutes on 5 or more days each week.  Do not use any products that  contain nicotine or tobacco, such as cigarettes, e-cigarettes, and chewing tobacco. If you need help quitting, ask your health care provider.  If you are sexually active, practice safe sex. Use a condom or other form of protection to prevent STIs (sexually transmitted infections).  Talk with your health care provider about taking a low-dose aspirin or statin. What's next?  Visit your health care provider once a year for a well check visit.  Ask your health care provider how often you should have your eyes and teeth checked.  Stay up to date on all vaccines. This information is not intended to replace advice given to you by your health care provider. Make sure you discuss any questions you have with your health care provider. Document Released: 06/22/2015 Document Revised: 05/20/2018 Document Reviewed: 05/20/2018 Elsevier Patient Education  2020 [place], [place] [facility]

## 2019-05-18 NOTE — Patient Instructions (Signed)
-Nice seeing you today!!  -Lab work today; will notify you once results are available.  -Will arrange for physical therapy.  -Schedule follow up in 4 months.   Preventive Care 78 Years and Older, Male Preventive care refers to lifestyle choices and visits with your health care provider that can promote health and wellness. This includes:  A yearly physical exam. This is also called an annual well check.  Regular dental and eye exams.  Immunizations.  Screening for certain conditions.  Healthy lifestyle choices, such as diet and exercise. What can I expect for my preventive care visit? Physical exam Your health care provider will check:  Height and weight. These may be used to calculate body mass index (BMI), which is a measurement that tells if you are at a healthy weight.  Heart rate and blood pressure.  Your skin for abnormal spots. Counseling Your health care provider may ask you questions about:  Alcohol, tobacco, and drug use.  Emotional well-being.  Home and relationship well-being.  Sexual activity.  Eating habits.  History of falls.  Memory and ability to understand (cognition).  Work and work Statistician. What immunizations do I need?  Influenza (flu) vaccine  This is recommended every year. Tetanus, diphtheria, and pertussis (Tdap) vaccine  You may need a Td booster every 10 years. Varicella (chickenpox) vaccine  You may need this vaccine if you have not already been vaccinated. Zoster (shingles) vaccine  You may need this after age 64. Pneumococcal conjugate (PCV13) vaccine  One dose is recommended after age 74. Pneumococcal polysaccharide (PPSV23) vaccine  One dose is recommended after age 15. Measles, mumps, and rubella (MMR) vaccine  You may need at least one dose of MMR if you were born in 1957 or later. You may also need a second dose. Meningococcal conjugate (MenACWY) vaccine  You may need this if you have certain conditions.  Hepatitis A vaccine  You may need this if you have certain conditions or if you travel or work in places where you may be exposed to hepatitis A. Hepatitis B vaccine  You may need this if you have certain conditions or if you travel or work in places where you may be exposed to hepatitis B. Haemophilus influenzae type b (Hib) vaccine  You may need this if you have certain conditions. You may receive vaccines as individual doses or as more than one vaccine together in one shot (combination vaccines). Talk with your health care provider about the risks and benefits of combination vaccines. What tests do I need? Blood tests  Lipid and cholesterol levels. These may be checked every 5 years, or more frequently depending on your overall health.  Hepatitis C test.  Hepatitis B test. Screening  Lung cancer screening. You may have this screening every year starting at age 78 if you have a 30-pack-year history of smoking and currently smoke or have quit within the past 15 years.  Colorectal cancer screening. All adults should have this screening starting at age 55 and continuing until age 66. Your health care provider may recommend screening at age 53 if you are at increased risk. You will have tests every 1-10 years, depending on your results and the type of screening test.  Prostate cancer screening. Recommendations will vary depending on your family history and other risks.  Diabetes screening. This is done by checking your blood sugar (glucose) after you have not eaten for a while (fasting). You may have this done every 1-3 years.  Abdominal aortic aneurysm (  AAA) screening. You may need this if you are a current or former smoker.  Sexually transmitted disease (STD) testing. Follow these instructions at home: Eating and drinking  Eat a diet that includes fresh fruits and vegetables, whole grains, lean protein, and low-fat dairy products. Limit your intake of foods with high amounts of  sugar, saturated fats, and salt.  Take vitamin and mineral supplements as recommended by your health care provider.  Do not drink alcohol if your health care provider tells you not to drink.  If you drink alcohol: ? Limit how much you have to 0-2 drinks a day. ? Be aware of how much alcohol is in your drink. In the [place] one drink equals one 12 oz bottle of beer (355 mL), one 5 oz glass of wine (148 mL), or one 1 oz glass of hard liquor (44 mL). Lifestyle  Take daily care of your teeth and gums.  Stay active. Exercise for at least 30 minutes on 5 or more days each week.  Do not use any products that contain nicotine or tobacco, such as cigarettes, e-cigarettes, and chewing tobacco. If you need help quitting, ask your health care provider.  If you are sexually active, practice safe sex. Use a condom or other form of protection to prevent STIs (sexually transmitted infections).  Talk with your health care provider about taking a low-dose aspirin or statin. What's next?  Visit your health care provider once a year for a well check visit.  Ask your health care provider how often you should have your eyes and teeth checked.  Stay up to date on all vaccines. This information is not intended to replace advice given to you by your health care provider. Make sure you discuss any questions you have with your health care provider. Document Released: 06/22/2015 Document Revised: 05/20/2018 Document Reviewed: 05/20/2018 Elsevier Patient Education  2020 [employer].

## 2019-07-01 ENCOUNTER — Encounter: Payer: Self-pay | Admitting: Internal Medicine

## 2019-07-01 ENCOUNTER — Encounter (HOSPITAL_COMMUNITY): Payer: Self-pay

## 2019-07-01 ENCOUNTER — Ambulatory Visit (HOSPITAL_COMMUNITY)
Admission: EM | Admit: 2019-07-01 | Discharge: 2019-07-01 | Disposition: A | Discharge status: Home / Self Care | Payer: Medicare Other | Attending: Family Medicine | Admitting: Family Medicine

## 2019-07-01 ENCOUNTER — Ambulatory Visit (INDEPENDENT_AMBULATORY_CARE_PROVIDER_SITE_OTHER): Payer: Medicare Other

## 2019-07-01 ENCOUNTER — Other Ambulatory Visit: Payer: Self-pay

## 2019-07-01 DIAGNOSIS — R509 Fever, unspecified: Secondary | ICD-10-CM | POA: Diagnosis not present

## 2019-07-01 DIAGNOSIS — J189 Pneumonia, unspecified organism: Secondary | ICD-10-CM | POA: Diagnosis not present

## 2019-07-01 DIAGNOSIS — Z20822 Contact with and (suspected) exposure to covid-19: Secondary | ICD-10-CM | POA: Diagnosis not present

## 2019-07-01 LAB — POC SARS CORONAVIRUS 2 AG: SARS Coronavirus 2 Ag: NEGATIVE

## 2019-07-01 LAB — POC SARS CORONAVIRUS 2 AG -  ED: SARS Coronavirus 2 Ag: NEGATIVE

## 2019-07-01 MED ORDER — AZITHROMYCIN 250 MG PO TABS: 250.0000 mg | ORAL_TABLET | Freq: Every day | ORAL | 0 refills | Status: DC

## 2019-07-01 MED ORDER — AMOXICILLIN-POT CLAVULANATE 875-125 MG PO TABS: 1.0000 | ORAL_TABLET | Freq: Two times a day (BID) | ORAL | 0 refills | Status: DC

## 2019-07-01 MED ORDER — PREDNISONE 20 MG PO TABS: 20.0000 mg | ORAL_TABLET | Freq: Two times a day (BID) | ORAL | 0 refills | Status: DC

## 2019-07-01 NOTE — ED Triage Notes (Signed)
Patient presents to Urgent Care with complaints of fever of 100.3 this morning. Patient reports he has asthma and is diabetic and has heart problems, but wants to be sure it is not covid. Pt did not take any medication (including his daily meds) this morning pta.

## 2019-07-01 NOTE — Discharge Instructions (Addendum)
The current recommendation for pneumonia is two antibiotics Take the augmentin 2 x a day Take the z pak as directed Take the prednisone 2 x a day for inflammation and shortness of breath PUSH FLUIDS Call your PCP for additional information/advice Stay home unless you develop severe shortness of breath or dizziness/weakness

## 2019-07-01 NOTE — ED Provider Notes (Signed)
[facility]    CSN: [id number] Arrival date & time: 07/01/19  0845      History   Chief Complaint Chief Complaint  Patient presents with  . Fever    HPI Richard Ho is a 79 y.o. male.   HPI  This is a very pleasant 79 year old gentleman who has a fever he woke this morning with a temperature of 100.3 and "increased asthma" which is how he describes his shortness of breath.  He did not take any of his usual medicines this morning.  He is not having any body aches.  No chills.  No loss of smell or taste.  No loss of appetite.  No nausea or vomiting.  He feels somewhat achy and tired.  Feels more short of breath especially with walking.  Denies any exposure to coronavirus.  Is quite concerned about having coronavirus given his underlying medical problems including obesity, diabetes, coronary atherosclerosis, asthma.  Past Medical History:  Diagnosis Date  . Asthma   . Coronary artery disease 1991   a) Angioplasty LAD 1991 [place] b) MI in 1995 at  [facility] in [place], med Rx  . Diabetes mellitus   . GERD (gastroesophageal reflux disease)   . Hearing loss   . HTN (hypertension)   . Hyperlipidemia   . Hypothyroidism   . Myocardial infarct (HCC)   . Nephrolithiasis   . Osteopenia   . Snake bite     Patient Active Problem List   Diagnosis Date Noted  . Vitamin D deficiency 05/18/2019  . Hyperkalemia 11/03/2018  . Retinal detachment 04/29/2018  . Morbid obesity (HCC) 04/29/2018  . Diabetes mellitus with renal complications (HCC) 12/21/2015  . After cataract of both eyes not obscuring vision 02/17/2014  . Dry eye syndrome, bilateral 02/17/2014  . PVD (posterior vitreous detachment), both eyes 02/17/2014  . Chronic kidney disease, stage III (moderate) 01/26/2014  . Diabetic peripheral neuropathy (HCC) 11/21/2013  . Pseudophakia of both eyes 08/18/2013  . Pseudophakia, left eye 07/07/2013  . Pseudophakia of right eye 06/24/2013  . After cataract,  right eye 05/02/2013  . Arcus senilis of both corneas 05/02/2013  . Combined senile cataract 05/02/2013  . Dermatochalasis of eyelid 05/02/2013  . PVD (posterior vitreous detachment), right eye 05/02/2013  . Macrocytic anemia 01/29/2012  . Hypothyroidism 10/22/2011  . Type 2 diabetes mellitus with renal complication (HCC) 03/14/2011  . Asthma with exacerbation 06/09/2007  . Essential hypertension 04/08/2007  . Dyslipidemia 05/04/2006  . MYOCARDIAL INFARCTION, HX OF 05/04/2006  . Coronary atherosclerosis 05/04/2006  . Asthma 05/04/2006  . GERD 05/04/2006  . OSTEOPENIA 05/04/2006  . PSA, INCREASED 05/04/2006  . NEPHROLITHIASIS, HX OF 05/04/2006    Past Surgical History:  Procedure Laterality Date  . ANGIOPLASTY  1991  . CARDIAC CATHETERIZATION  2005   Moderate LCx and LAD disease and severe diffuse RCA disease  . CARDIAC CATHETERIZATION  01/2012   normal left main, 50% pLAD stenosis, dLAD mild luminal irregularities, D1 30-40% stenosis at take off; subtotally occluded/severely diseased OM1, mid 80% stenosis in small OM2, long prox and mid RCA stenoses ranging from 60-95%, severely diseased PDA, distal PLSA occluded filling via L-R collateral; normal LV function; inferior lateral basal akinesis.  . CHOLECYSTECTOMY  2012  . EYE SURGERY  2010   cataract - right, has left done too  . INGUINAL HERNIA REPAIR    . LEFT HEART CATHETERIZATION WITH CORONARY ANGIOGRAM N/A 01/28/2012   Procedure: LEFT HEART CATHETERIZATION WITH CORONARY ANGIOGRAM;  Surgeon: Iran Ouch, MD;  Location: [facility] CATH LAB;  Service: Cardiovascular;  Laterality: N/A;  . LEFT HEART CATHETERIZATION WITH CORONARY ANGIOGRAM N/A 03/17/2014   Procedure: LEFT HEART CATHETERIZATION WITH CORONARY ANGIOGRAM;  Surgeon: Kathleene Hazel, MD;  Location: [facility] CATH LAB;  Service: Cardiovascular;  Laterality: N/A;  . ORIF TIBIA & FIBULA FRACTURES    . PTCA    . SHOULDER ARTHROSCOPY WITH DISTAL CLAVICLE RESECTION Left 12/16/2018    Procedure: LEFT SHOULDER ARTHROSCOPY WITH DISTAL CLAVICLE RESECTION;  Surgeon: Bjorn Pippin, MD;  Location: [facility];  Service: Orthopedics;  Laterality: Left;  . SHOULDER ARTHROSCOPY WITH ROTATOR CUFF REPAIR AND SUBACROMIAL DECOMPRESSION Left 12/16/2018   Procedure: LEFT SHOULDER ARTHROSCOPY WITH ROTATOR CUFF REPAIR AND SUBACROMIAL DECOMPRESSION, DEBRIDEMENT;  Surgeon: Bjorn Pippin, MD;  Location: [facility];  Service: Orthopedics;  Laterality: Left;  . SHOULDER ARTHROSCOPY WITH SUBACROMIAL DECOMPRESSION AND BICEP TENDON REPAIR Left 12/16/2018   Procedure: SHOULDER ARTHROSCOPY WITH SUBACROMIAL DECOMPRESSION AND BICEP TENDON REPAIR;  Surgeon: Bjorn Pippin, MD;  Location: [facility];  Service: Orthopedics;  Laterality: Left;  Marland Kitchen VASECTOMY         Home Medications    Prior to Admission medications   Medication Sig Start Date End Date Taking? Authorizing Provider  amoxicillin-clavulanate (AUGMENTIN) 875-125 MG tablet Take 1 tablet by mouth every 12 (twelve) hours. 07/01/19   Eustace Moore, MD  aspirin EC 81 MG tablet Take 81 mg by mouth daily.    [provider]  azithromycin (ZITHROMAX) 250 MG tablet Take 1 tablet (250 mg total) by mouth daily. Take first 2 tablets together, then 1 every day until finished. 07/01/19   Eustace Moore, MD  calcium-vitamin D (OSCAL WITH D) 500-200 MG-UNIT tablet Take 1 tablet by mouth.    [provider]  ferrous sulfate 325 (65 FE) MG EC tablet Take 325 mg by mouth 3 (three) times daily with meals.    [provider]  fluticasone (FLONASE) 50 MCG/ACT nasal spray USE 1 TO 2 SPRAY(S) IN EACH NOSTRIL ONCE DAILY AS DIRECTED 05/19/19   Kozlow, Alvira Philips, MD  fluticasone (FLOVENT HFA) 110 MCG/ACT inhaler Inhale three puffs three times daily during flare-up.  Rinse, gargle, and spit after use. 06/29/18   Kozlow, Alvira Philips, MD  fluticasone furoate-vilanterol (BREO ELLIPTA) 200-25 MCG/INH AEPB Inhale 1  puff into the lungs daily. Rinse, gargle, and spit after use. 12/28/18   Kozlow, Alvira Philips, MD  isosorbide mononitrate (IMDUR) 30 MG 24 hr tablet TAKE ONE TABLET BY MOUTH TWICE DAILY 06/30/16   Gordy Savers, MD  levothyroxine (SYNTHROID) 75 MCG tablet Take 1 tablet (75 mcg total) by mouth daily. 05/18/19   Philip Aspen, Limmie Patricia, MD  metFORMIN (GLUCOPHAGE) 1000 MG tablet TAKE 1 TABLET BY MOUTH TWICE DAILY WITH MEALS 03/08/19   Philip Aspen, Limmie Patricia, MD  metoprolol tartrate (LOPRESSOR) 50 MG tablet Take 1 tablet (50 mg total) by mouth 2 (two) times daily. 07/14/18   Philip Aspen, Limmie Patricia, MD  montelukast (SINGULAIR) 10 MG tablet Take one tablet by mouth once daily as directed. 01/18/19   Kozlow, Alvira Philips, MD  Multiple Vitamins-Minerals (CENTRUM ADULTS PO) Take by mouth.    [provider]  nitroGLYCERIN (NITROSTAT) 0.4 MG SL tablet Place 1 tablet (0.4 mg total) under the tongue every 5 (five) minutes as needed. Chest pain 11/21/13   Gordy Savers, MD  oxyCODONE (OXY IR/ROXICODONE) 5 MG immediate release tablet  TAKE 1 TABLET BY MOUTH EVERY 6 TO 8 HOURS AS NEEDED FOR PAIN 12/23/18   [provider]  pantoprazole (PROTONIX) 40 MG tablet Take 40 mg by mouth daily.    [provider]  predniSONE (DELTASONE) 20 MG tablet Take 1 tablet (20 mg total) by mouth 2 (two) times daily with a meal. 07/01/19   Eustace Moore, MD  pregabalin (LYRICA) 75 MG capsule Take 1 capsule (75 mg total) by mouth 2 (two) times daily. 01/04/19   Philip Aspen, Limmie Patricia, MD  simvastatin (ZOCOR) 80 MG tablet Take 40 mg by mouth at bedtime. Rx from [place]    [provider]  tamsulosin (FLOMAX) 0.4 MG CAPS capsule TAKE ONE CAPSULE BY MOUTH DAILY 06/26/14   Gordy Savers, MD  Vitamin D, Ergocalciferol, (DRISDOL) 1.25 MG (50000 UT) CAPS capsule Take 1 capsule (50,000 Units total) by mouth every 7 (seven) days for 12 doses. 05/18/19 08/04/19  Philip Aspen, Limmie Patricia, MD   traZODone (DESYREL) 50 MG tablet Take 1 tablet (50 mg total) by mouth at bedtime as needed for sleep. Patient not taking: Reported on 05/18/2019 02/16/19 07/01/19  Philip Aspen, Limmie Patricia, MD    Family History Family History  Problem Relation Age of Onset  . Asthma Mother   . Allergies Sister   . Coronary artery disease Other     Social History Social History   Tobacco Use  . Smoking status: Never Smoker  . Smokeless tobacco: Never Used  Substance Use Topics  . Alcohol use: Yes    Alcohol/week: 0.0 standard drinks    Comment: occ, once every 2 months  . Drug use: No     Allergies   Trazodone and nefazodone and Meloxicam   Review of Systems Review of Systems  Constitutional: Positive for appetite change, fatigue and fever. Negative for chills.  HENT: Negative for congestion and hearing loss.   Eyes: Negative for pain.  Respiratory: Positive for cough and shortness of breath.   Cardiovascular: Negative for chest pain and leg swelling.  Gastrointestinal: Negative for diarrhea, nausea and vomiting.  Genitourinary: Negative for dysuria and frequency.  Musculoskeletal: Positive for myalgias.  Neurological: Negative for dizziness and headaches.  Psychiatric/Behavioral: Negative for sleep disturbance.     Physical Exam Triage Vital Signs ED Triage Vitals  Enc Vitals Group     BP 07/01/19 0905 (!) 154/90     Pulse Rate 07/01/19 0905 (!) 104     Resp 07/01/19 0905 (!) 22     Temp 07/01/19 0905 99.2 F (37.3 C)     Temp Source 07/01/19 0905 Oral     SpO2 07/01/19 0905 98 %     Weight --      Height --      Head Circumference --      Peak Flow --      Pain Score 07/01/19 0903 3     Pain Loc --      Pain Edu? --      Excl. in GC? --    No data found.  Updated Vital Signs BP (!) 154/90 (BP Location: Left Arm)   Pulse (!) 104   Temp 99.2 F (37.3 C) (Oral)   Resp (!) 22   SpO2 98%      Physical Exam Constitutional:      General: He is not in acute  distress.    Appearance: He is obese. He is not ill-appearing.     Comments: Truncal obesity  HENT:  Head: Normocephalic and atraumatic.     Mouth/Throat:     Comments: Mask in place Cardiovascular:     Rate and Rhythm: Regular rhythm. Tachycardia present.     Heart sounds: Normal heart sounds.  Pulmonary:     Effort: Pulmonary effort is normal.     Breath sounds: Rales present.     Comments: Rales both bases Abdominal:     Comments: Protuberant abdomen  Musculoskeletal:        General: Normal range of motion.     Cervical back: Normal range of motion.     Right lower leg: No edema.     Left lower leg: No edema.  Skin:    General: Skin is warm and dry.     Findings: No rash.  Neurological:     Mental Status: He is alert. Mental status is at baseline.  Psychiatric:        Mood and Affect: Mood normal.        Behavior: Behavior normal.      UC Treatments / Results  Labs (all labs ordered are listed, but only abnormal results are displayed) Labs Reviewed  NOVEL CORONAVIRUS, NAA (HOSP ORDER, SEND-OUT TO REF LAB; TAT 18-24 HRS)  POC SARS CORONAVIRUS 2 AG -  ED  POC SARS CORONAVIRUS 2 AG  Point-of-care coronavirus negative, confirmation is sent EKG   Radiology DG Chest 2 View  Result Date: 07/01/2019 CLINICAL DATA:  Dyspnea, fever EXAM: CHEST - 2 VIEW COMPARISON:  12/17/2018 chest radiograph. FINDINGS: Stable cardiomediastinal silhouette with normal heart size. No pneumothorax. No pleural effusion. New mild-to-moderate patchy opacity throughout the left lung. Mild bibasilar streaky opacities. IMPRESSION: New mild-to-moderate patchy opacity throughout the left lung, suspicious for pneumonia. Follow-up chest radiographs advised to document resolution. Mild streaky opacities at the lung bases compatible with mild bibasilar scarring or atelectasis. Electronically Signed   By: Ilona Sorrel M.D.   On: 07/01/2019 10:05    Procedures Procedures (including critical care  time)  Medications Ordered in UC Medications - No data to display  Initial Impression / Assessment and Plan / UC Course  I have reviewed the triage vital signs and the nursing notes.  Pertinent labs & imaging results that were available during my care of the patient were reviewed by me and considered in my medical decision making (see chart for details).  Clinical Course as of Jun 30 2057  Fri Jul 01, 2019  1016 DG Chest 2 View [YN]    Clinical Course User Index [YN] Raylene Everts, MD    Patient has pneumonia.  Negative Covid test.  Although his x-ray resembles a viral pneumonia and will cover him with antibiotics given his number of pre-existing conditions noted in the HPI. He is scheduled for his Covid vaccine next week.  This will need to be delayed if he has Covid Final Clinical Impressions(s) / UC Diagnoses   Final diagnoses:  Pneumonia of left lower lobe due to infectious organism  Suspected COVID-19 virus infection     Discharge Instructions     The current recommendation for pneumonia is two antibiotics Take the augmentin 2 x a day Take the z pak as directed Take the prednisone 2 x a day for inflammation and shortness of breath PUSH FLUIDS Call your PCP for additional information/advice Stay home unless you develop severe shortness of breath or dizziness/weakness    ED Prescriptions    Medication Sig Dispense Auth. Provider   amoxicillin-clavulanate (AUGMENTIN) 875-125 MG tablet Take 1  tablet by mouth every 12 (twelve) hours. 14 tablet Eustace MooreNelson, Maryfer Tauzin Sue, MD   azithromycin (ZITHROMAX) 250 MG tablet Take 1 tablet (250 mg total) by mouth daily. Take first 2 tablets together, then 1 every day until finished. 6 tablet Eustace MooreNelson, Talayla Doyel Sue, MD   predniSONE (DELTASONE) 20 MG tablet Take 1 tablet (20 mg total) by mouth 2 (two) times daily with a meal. 10 tablet Eustace MooreNelson, Davine Sweney Sue, MD     PDMP not reviewed this encounter.   Eustace MooreNelson, Tekla Malachowski Sue, MD 07/01/19 2100

## 2019-07-03 LAB — NOVEL CORONAVIRUS, NAA (HOSP ORDER, SEND-OUT TO REF LAB; TAT 18-24 HRS): SARS-CoV-2, NAA: NOT DETECTED

## 2019-07-05 ENCOUNTER — Ambulatory Visit: Payer: Medicare Other | Admitting: Allergy and Immunology

## 2019-07-08 ENCOUNTER — Encounter (INDEPENDENT_AMBULATORY_CARE_PROVIDER_SITE_OTHER): Payer: Self-pay

## 2019-07-10 ENCOUNTER — Other Ambulatory Visit: Payer: Self-pay | Admitting: Internal Medicine

## 2019-07-10 DIAGNOSIS — E1142 Type 2 diabetes mellitus with diabetic polyneuropathy: Secondary | ICD-10-CM

## 2019-07-19 ENCOUNTER — Other Ambulatory Visit: Payer: Self-pay

## 2019-07-19 ENCOUNTER — Ambulatory Visit: Payer: Medicare Other | Admitting: Allergy and Immunology

## 2019-07-19 ENCOUNTER — Encounter: Payer: Self-pay | Admitting: Allergy and Immunology

## 2019-07-19 VITALS — BP 120/62 | HR 68 | Temp 96.7°F | Resp 16 | Ht 71.0 in | Wt 220.6 lb

## 2019-07-19 DIAGNOSIS — J3089 Other allergic rhinitis: Secondary | ICD-10-CM

## 2019-07-19 DIAGNOSIS — K219 Gastro-esophageal reflux disease without esophagitis: Secondary | ICD-10-CM | POA: Diagnosis not present

## 2019-07-19 DIAGNOSIS — J449 Chronic obstructive pulmonary disease, unspecified: Secondary | ICD-10-CM | POA: Diagnosis not present

## 2019-07-19 MED ORDER — PANTOPRAZOLE SODIUM 40 MG PO TBEC: 40.0000 mg | DELAYED_RELEASE_TABLET | Freq: Every day | ORAL | 1 refills | Status: DC

## 2019-07-19 MED ORDER — MONTELUKAST SODIUM 10 MG PO TABS: ORAL_TABLET | ORAL | 1 refills | Status: DC

## 2019-07-19 MED ORDER — BREO ELLIPTA 200-25 MCG/INH IN AEPB: 1.0000 | INHALATION_SPRAY | Freq: Every day | RESPIRATORY_TRACT | 1 refills | Status: DC

## 2019-07-19 NOTE — Patient Instructions (Addendum)
  1. Continue to Perform Allergen avoidance measures - dust mite, pollens, molds  2. Continue to Treat and prevent inflammation:   A. Breo 200 - one inhalation 1 time per day.    B. Flonase - 1-2 sprays each nostril one time per day  C. Can attempt to discontinue Montelukast 10 mg daily  3. If needed:   A. Proventil or Ventolin HFA 2 puffs every 4-6 hours  B. OTC antihistamine - Claritin/Zyrtec/Allegra  C. nasal ipratropium 0.06% 2 sprays each nostril every 6 hours  D. Patanol - 1 drop each eye 1-2 times per day  4. "Action plan" for asthma flare up:   A. continue Breo one time a day  B. add Flovent 110 3 inhalations 3 times a day with spacer  C. use Proventil or Ventolin HFA if needed  5. Continue treatment for reflux with Protonix 40 mg daily  6. Return to clinic in 6 months or earlier if problem

## 2019-07-19 NOTE — Progress Notes (Signed)
[place] [facility] [place] [facility] [place] - [place] - [name]   Follow-up Note  Referring Provider: Philip Aspen, Almira Bar* Primary Provider: Philip Aspen, Limmie Patricia, MD Date of Office Visit: 07/19/2019  Subjective:   Richard Ho (DOB: [birth year]) is a 79 y.o. male who [facility] in re-evaluation of the following:  HPI: Richard Ho returns to this clinic in evaluation of asthma and allergic rhinoconjunctivitis and history of reflux.  His last visit to this clinic was 28 December 2018.  He unfortunately contracted a "pneumonia" with a x-ray showing a left-sided infiltrate and a fever of 100.3 towards the tail end of January for which he went to the urgent care center and was treated with a systemic steroid and a combination of a beta-lactam and macrolide with good result.  He has completely resolved all the symptoms associated with that event.     Prior to that point in time and since that point in time he has really done well with his airway and he rarely uses a short acting bronchodilator.  He did not require systemic steroid or an antibiotic other than that event in January 2021 for any type of airway issue.  He continues to use Breo and Flonase and montelukast consistently.  He believes that his nose is doing very well at this point in time.  He believes that his reflux is under very good control at this point in time.  He did receive the flu vaccine and has had the first Covid vaccination.  Allergies as of 07/19/2019      Reactions   Trazodone And Nefazodone    "makes me freak out"   Meloxicam Other (See Comments)   "jumping out of my skin" Pt feels anxiety attacks when on this medicine      Medication List    aspirin EC 81 MG tablet Take 81 mg by mouth daily.   Breo Ellipta 200-25 MCG/INH Aepb Generic drug: fluticasone furoate-vilanterol Inhale 1 puff into the lungs daily. Rinse, gargle, and spit after use.   calcium-vitamin D  500-200 MG-UNIT tablet Commonly known as: OSCAL WITH D Take 1 tablet by mouth.   CENTRUM ADULTS PO Take by mouth.   ferrous sulfate 325 (65 FE) MG EC tablet Take 325 mg by mouth 3 (three) times daily with meals.   fluticasone 110 MCG/ACT inhaler Commonly known as: Flovent HFA Inhale three puffs three times daily during flare-up.  Rinse, gargle, and spit after use.   fluticasone 50 MCG/ACT nasal spray Commonly known as: FLONASE USE 1 TO 2 SPRAY(S) IN EACH NOSTRIL ONCE DAILY AS DIRECTED   isosorbide mononitrate 30 MG 24 hr tablet Commonly known as: IMDUR TAKE ONE TABLET BY MOUTH TWICE DAILY   levothyroxine 75 MCG tablet Commonly known as: SYNTHROID Take 1 tablet (75 mcg total) by mouth daily.   metFORMIN 1000 MG tablet Commonly known as: GLUCOPHAGE TAKE 1 TABLET BY MOUTH TWICE DAILY WITH MEALS   metoprolol tartrate 50 MG tablet Commonly known as: LOPRESSOR Take 1 tablet (50 mg total) by mouth 2 (two) times daily.   montelukast 10 MG tablet Commonly known as: SINGULAIR Take one tablet by mouth once daily as directed.   nitroGLYCERIN 0.4 MG SL tablet Commonly known as: NITROSTAT Place 1 tablet (0.4 mg total) under the tongue every 5 (five) minutes as needed. Chest pain   pantoprazole 40 MG tablet Commonly known as: PROTONIX Take 40 mg by mouth daily.   pregabalin 75 MG  capsule Commonly known as: LYRICA Take 1 capsule (75 mg total) by mouth 2 (two) times daily.   simvastatin 80 MG tablet Commonly known as: ZOCOR Take 40 mg by mouth at bedtime. Rx from [place]   tamsulosin 0.4 MG Caps capsule Commonly known as: FLOMAX TAKE ONE CAPSULE BY MOUTH DAILY   Vitamin D (Ergocalciferol) 1.25 MG (50000 UNIT) Caps capsule Commonly known as: DRISDOL Take 1 capsule (50,000 Units total) by mouth every 7 (seven) days for 12 doses.       Past Medical History:  Diagnosis Date  . Asthma   . Coronary artery disease 1991   a) Angioplasty LAD 1991 [place] b) MI in 1995 at  [facility] in [place], med Rx  . Diabetes mellitus   . GERD (gastroesophageal reflux disease)   . Hearing loss   . HTN (hypertension)   . Hyperlipidemia   . Hypothyroidism   . Myocardial infarct ([place])   . Nephrolithiasis   . Osteopenia   . Snake bite     Past Surgical History:  Procedure Laterality Date  . ANGIOPLASTY  1991  . CARDIAC CATHETERIZATION  2005   Moderate LCx and LAD disease and severe diffuse RCA disease  . CARDIAC CATHETERIZATION  01/2012   normal left main, 50% pLAD stenosis, dLAD mild luminal irregularities, D1 30-40% stenosis at take off; subtotally occluded/severely diseased OM1, mid 80% stenosis in small OM2, long prox and mid RCA stenoses ranging from 60-95%, severely diseased PDA, distal PLSA occluded filling via L-R collateral; normal LV function; inferior lateral basal akinesis.  . CHOLECYSTECTOMY  2012  . EYE SURGERY  2010   cataract - right, has left done too  . INGUINAL HERNIA REPAIR    . LEFT HEART CATHETERIZATION WITH CORONARY ANGIOGRAM N/A 01/28/2012   Procedure: LEFT HEART CATHETERIZATION WITH CORONARY ANGIOGRAM;  Surgeon: Wellington [place], MD;  Location: [facility] CATH LAB;  Service: Cardiovascular;  Laterality: N/A;  . LEFT HEART CATHETERIZATION WITH CORONARY ANGIOGRAM N/A 03/17/2014   Procedure: LEFT HEART CATHETERIZATION WITH CORONARY ANGIOGRAM;  Surgeon: Burnell Blanks, MD;  Location: [facility] CATH LAB;  Service: Cardiovascular;  Laterality: N/A;  . ORIF TIBIA & FIBULA FRACTURES    . PTCA    . SHOULDER ARTHROSCOPY WITH DISTAL CLAVICLE RESECTION Left 12/16/2018   Procedure: LEFT SHOULDER ARTHROSCOPY WITH DISTAL CLAVICLE RESECTION;  Surgeon: Hiram Gash, MD;  Location: [facility];  Service: Orthopedics;  Laterality: Left;  . SHOULDER ARTHROSCOPY WITH ROTATOR CUFF REPAIR AND SUBACROMIAL DECOMPRESSION Left 12/16/2018   Procedure: LEFT SHOULDER ARTHROSCOPY WITH ROTATOR CUFF REPAIR AND SUBACROMIAL DECOMPRESSION, DEBRIDEMENT;  Surgeon: Hiram Gash, MD;  Location: [facility];  Service: Orthopedics;  Laterality: Left;  . SHOULDER ARTHROSCOPY WITH SUBACROMIAL DECOMPRESSION AND BICEP TENDON REPAIR Left 12/16/2018   Procedure: SHOULDER ARTHROSCOPY WITH SUBACROMIAL DECOMPRESSION AND BICEP TENDON REPAIR;  Surgeon: Hiram Gash, MD;  Location: [facility];  Service: Orthopedics;  Laterality: Left;  Marland Kitchen VASECTOMY      Review of systems negative except as noted in HPI / PMHx or noted below:  Review of Systems  Constitutional: Negative.   HENT: Negative.   Eyes: Negative.   Respiratory: Negative.   Cardiovascular: Negative.   Gastrointestinal: Negative.   Genitourinary: Negative.   Musculoskeletal: Negative.   Skin: Negative.   Neurological: Negative.   Endo/Heme/Allergies: Negative.   Psychiatric/Behavioral: Negative.       Objective:   Vitals:   07/19/19 0935  BP: 120/62  Pulse: 68  Resp: 16  Temp: (!) 96.7 F (35.9 C)  SpO2: 96%   Height: 5\' 11"  (180.3 cm)  Weight: 220 lb 9.6 oz (100.1 kg)   Physical Exam Constitutional:      Appearance: He is not diaphoretic.  HENT:     Head: Normocephalic.     Right Ear: Tympanic membrane, ear canal and external ear normal.     Left Ear: Tympanic membrane, ear canal and external ear normal.     Nose: Nose normal. No mucosal edema or rhinorrhea.     Mouth/Throat:     Pharynx: Uvula midline. No oropharyngeal exudate.  Eyes:     Conjunctiva/sclera: Conjunctivae normal.  Neck:     Thyroid: No thyromegaly.     Trachea: Trachea normal. No tracheal tenderness or tracheal deviation.  Cardiovascular:     Rate and Rhythm: Normal rate and regular rhythm.     Heart sounds: Normal heart sounds, S1 normal and S2 normal. No murmur.  Pulmonary:     Effort: No respiratory distress.     Breath sounds: Normal breath sounds. No stridor. No wheezing or rales.  Lymphadenopathy:     Head:     Right side of head: No tonsillar adenopathy.     Left side of head: No  tonsillar adenopathy.     Cervical: No cervical adenopathy.  Skin:    Findings: No erythema or rash.     Nails: There is no clubbing.  Neurological:     Mental Status: He is alert.     Diagnostics:    Spirometry was performed and demonstrated an FEV1 of 1.84 at 60 % of predicted.  Results of a chest x-ray obtained 01 July 2019 identified the following:  Stable cardiomediastinal silhouette with normal heart size. No pneumothorax. No pleural effusion. New mild-to-moderate patchy opacity throughout the left lung. Mild bibasilar streaky opacities.  Results of blood tests obtained 18 May 2019 identified WBC 6.5, absolute eosinophil 100, absolute lymphocyte 1800, hemoglobin 17.7, platelet 229  Assessment and Plan:   1. COPD with asthma (HCC)   2. Other allergic rhinitis   3. Gastroesophageal reflux disease, unspecified whether esophagitis present     1. Continue to Perform Allergen avoidance measures - dust mite, pollens, molds  2. Continue to Treat and prevent inflammation:   A. Breo 200 - one inhalation 1 time per day.    B. Flonase - 1-2 sprays each nostril one time per day  C. Can attempt to discontinue Montelukast 10 mg daily  3. If needed:   A. Proventil or Ventolin HFA 2 puffs every 4-6 hours  B. OTC antihistamine - Claritin/Zyrtec/Allegra  C. nasal ipratropium 0.06% 2 sprays each nostril every 6 hours  D. Patanol - 1 drop each eye 1-2 times per day  4. "Action plan" for asthma flare up:   A. continue Breo one time a day  B. add Flovent 110 3 inhalations 3 times a day with spacer  C. use Proventil or Ventolin HFA if needed  5. Continue treatment for reflux with Protonix 40 mg daily  6. Return to clinic in 6 months or earlier if problem  20 May 2019 appears to be doing relatively well even though he did contract a pneumonia this January but for the most part his airway appears to be under very good control on his current plan.  He can attempt to discontinue  his montelukast as I am not sure that is really adding much above and beyond the use of Breo  and Flonase.  He will continue on a collection of anti-inflammatory agents as noted above and also continue to treat reflux and assuming he does well as he goes through this upcoming springtime season I will see him back in this clinic in 6 months or earlier if there is a problem.  Laurette Schimke, MD Allergy / Immunology [facility]

## 2019-07-20 ENCOUNTER — Encounter: Payer: Self-pay | Admitting: Allergy and Immunology

## 2019-07-30 LAB — LIPID PANEL
Cholesterol: 148 (ref 0–200)
HDL: 44 (ref 35–70)
LDL Cholesterol: 56
Triglycerides: 239 — AB (ref 40–160)

## 2019-07-30 LAB — HEMOGLOBIN A1C: Hemoglobin A1C: 7

## 2019-08-02 ENCOUNTER — Encounter: Payer: Self-pay | Admitting: Internal Medicine

## 2019-08-10 ENCOUNTER — Other Ambulatory Visit: Payer: Self-pay | Admitting: Internal Medicine

## 2019-08-10 ENCOUNTER — Other Ambulatory Visit: Payer: Self-pay | Admitting: Adult Health

## 2019-08-10 DIAGNOSIS — E559 Vitamin D deficiency, unspecified: Secondary | ICD-10-CM

## 2019-08-10 DIAGNOSIS — E1142 Type 2 diabetes mellitus with diabetic polyneuropathy: Secondary | ICD-10-CM

## 2019-08-11 ENCOUNTER — Other Ambulatory Visit: Payer: Self-pay

## 2019-08-12 ENCOUNTER — Other Ambulatory Visit (INDEPENDENT_AMBULATORY_CARE_PROVIDER_SITE_OTHER): Payer: Medicare Other

## 2019-08-12 DIAGNOSIS — E559 Vitamin D deficiency, unspecified: Secondary | ICD-10-CM

## 2019-08-12 DIAGNOSIS — E875 Hyperkalemia: Secondary | ICD-10-CM

## 2019-08-12 DIAGNOSIS — N183 Chronic kidney disease, stage 3 unspecified: Secondary | ICD-10-CM

## 2019-08-12 LAB — BASIC METABOLIC PANEL
BUN: 20 mg/dL (ref 6–23)
CO2: 25 mEq/L (ref 19–32)
Calcium: 9.7 mg/dL (ref 8.4–10.5)
Chloride: 105 mEq/L (ref 96–112)
Creatinine, Ser: 1.36 mg/dL (ref 0.40–1.50)
GFR: 50.61 mL/min — ABNORMAL LOW (ref >60.00)
Glucose, Bld: 103 mg/dL — ABNORMAL HIGH (ref 70–99)
Potassium: 5 mEq/L (ref 3.5–5.1)
Sodium: 139 mEq/L (ref 135–145)

## 2019-08-12 LAB — VITAMIN D 25 HYDROXY (VIT D DEFICIENCY, FRACTURES): VITD: 36.72 ng/mL (ref 30.00–100.00)

## 2019-08-16 ENCOUNTER — Encounter: Payer: Self-pay | Admitting: Internal Medicine

## 2019-08-17 ENCOUNTER — Other Ambulatory Visit: Payer: Self-pay

## 2019-08-18 ENCOUNTER — Encounter: Payer: Self-pay | Admitting: Family

## 2019-08-18 ENCOUNTER — Ambulatory Visit: Payer: Medicare Other | Admitting: Family

## 2019-08-18 VITALS — BP 110/70 | HR 43 | Temp 97.1°F | Wt 224.7 lb

## 2019-08-18 DIAGNOSIS — D631 Anemia in chronic kidney disease: Secondary | ICD-10-CM | POA: Diagnosis not present

## 2019-08-18 DIAGNOSIS — E1129 Type 2 diabetes mellitus with other diabetic kidney complication: Secondary | ICD-10-CM | POA: Diagnosis not present

## 2019-08-18 DIAGNOSIS — N2581 Secondary hyperparathyroidism of renal origin: Secondary | ICD-10-CM | POA: Diagnosis not present

## 2019-08-18 DIAGNOSIS — N1831 Chronic kidney disease, stage 3a: Secondary | ICD-10-CM | POA: Diagnosis not present

## 2019-08-18 DIAGNOSIS — D649 Anemia, unspecified: Secondary | ICD-10-CM | POA: Diagnosis not present

## 2019-08-18 DIAGNOSIS — I129 Hypertensive chronic kidney disease with stage 1 through stage 4 chronic kidney disease, or unspecified chronic kidney disease: Secondary | ICD-10-CM | POA: Diagnosis not present

## 2019-08-18 LAB — COMPREHENSIVE METABOLIC PANEL
ALT: 14 U/L (ref 0–53)
AST: 15 U/L (ref 0–37)
Albumin: 3.8 g/dL (ref 3.5–5.2)
Alkaline Phosphatase: 66 U/L (ref 39–117)
BUN: 24 mg/dL — ABNORMAL HIGH (ref 6–23)
CO2: 27 mEq/L (ref 19–32)
Calcium: 9.4 mg/dL (ref 8.4–10.5)
Chloride: 105 mEq/L (ref 96–112)
Creatinine, Ser: 1.44 mg/dL (ref 0.40–1.50)
GFR: 47.38 mL/min — ABNORMAL LOW (ref >60.00)
Glucose, Bld: 101 mg/dL — ABNORMAL HIGH (ref 70–99)
Potassium: 4.4 mEq/L (ref 3.5–5.1)
Sodium: 137 mEq/L (ref 135–145)
Total Bilirubin: 0.3 mg/dL (ref 0.2–1.2)
Total Protein: 6 g/dL (ref 6.0–8.3)

## 2019-08-18 LAB — CBC WITH DIFFERENTIAL/PLATELET
Basophils Absolute: 0 10*3/uL (ref 0.0–0.1)
Basophils Relative: 0.7 % (ref 0.0–3.0)
Eosinophils Absolute: 0.2 10*3/uL (ref 0.0–0.7)
Eosinophils Relative: 2.6 % (ref 0.0–5.0)
HCT: 33.3 % — ABNORMAL LOW (ref 39.0–52.0)
Hemoglobin: 11.1 g/dL — ABNORMAL LOW (ref 13.0–17.0)
Lymphocytes Relative: 31.9 % (ref 12.0–46.0)
Lymphs Abs: 2.1 10*3/uL (ref 0.7–4.0)
MCHC: 33.4 g/dL (ref 30.0–36.0)
MCV: 101.2 fl — ABNORMAL HIGH (ref 78.0–100.0)
Monocytes Absolute: 0.7 10*3/uL (ref 0.1–1.0)
Monocytes Relative: 11.2 % (ref 3.0–12.0)
Neutro Abs: 3.5 10*3/uL (ref 1.4–7.7)
Neutrophils Relative %: 53.6 % (ref 43.0–77.0)
Platelets: 223 10*3/uL (ref 150.0–400.0)
RBC: 3.29 Mil/uL — ABNORMAL LOW (ref 4.22–5.81)
RDW: 14.1 % (ref 11.5–15.5)
WBC: 6.5 10*3/uL (ref 4.0–10.5)

## 2019-08-18 LAB — POCT HEMOGLOBIN: Hemoglobin: 10.4 g/dL — AB (ref 11–14.6)

## 2019-08-18 NOTE — Patient Instructions (Signed)
Anemia  Anemia is a condition in which you do not have enough red blood cells or hemoglobin. Hemoglobin is a substance in red blood cells that carries oxygen. When you do not have enough red blood cells or hemoglobin (are anemic), your body cannot get enough oxygen and your organs may not work properly. As a result, you may feel very tired or have other problems. What are the causes? Common causes of anemia include:  Excessive bleeding. Anemia can be caused by excessive bleeding inside or outside the body, including bleeding from the intestine or from periods in women.  Poor nutrition.  Long-lasting (chronic) kidney, thyroid, and liver disease.  Bone marrow disorders.  Cancer and treatments for cancer.  HIV (human immunodeficiency virus) and AIDS (acquired immunodeficiency syndrome).  Treatments for HIV and AIDS.  Spleen problems.  Blood disorders.  Infections, medicines, and autoimmune disorders that destroy red blood cells. What are the signs or symptoms? Symptoms of this condition include:  Minor weakness.  Dizziness.  Headache.  Feeling heartbeats that are irregular or faster than normal (palpitations).  Shortness of breath, especially with exercise.  Paleness.  Cold sensitivity.  Indigestion.  Nausea.  Difficulty sleeping.  Difficulty concentrating. Symptoms may occur suddenly or develop slowly. If your anemia is mild, you may not have symptoms. How is this diagnosed? This condition is diagnosed based on:  Blood tests.  Your medical history.  A physical exam.  Bone marrow biopsy. Your health care provider may also check your stool (feces) for blood and may do additional testing to look for the cause of your bleeding. You may also have other tests, including:  Imaging tests, such as a CT scan or MRI.  Endoscopy.  Colonoscopy. How is this treated? Treatment for this condition depends on the cause. If you continue to lose a lot of blood, you may  need to be treated at a hospital. Treatment may include:  Taking supplements of iron, vitamin S31, or folic acid.  Taking a hormone medicine (erythropoietin) that can help to stimulate red blood cell growth.  Having a blood transfusion. This may be needed if you lose a lot of blood.  Making changes to your diet.  Having surgery to remove your spleen. Follow these instructions at home:  Take over-the-counter and prescription medicines only as told by your health care provider.  Take supplements only as told by your health care provider.  Follow any diet instructions that you were given.  Keep all follow-up visits as told by your health care provider. This is important. Contact a health care provider if:  You develop new bleeding anywhere in the body. Get help right away if:  You are very weak.  You are short of breath.  You have pain in your abdomen or chest.  You are dizzy or feel faint.  You have trouble concentrating.  You have bloody or black, tarry stools.  You vomit repeatedly or you vomit up blood. Summary  Anemia is a condition in which you do not have enough red blood cells or enough of a substance in your red blood cells that carries oxygen (hemoglobin).  Symptoms may occur suddenly or develop slowly.  If your anemia is mild, you may not have symptoms.  This condition is diagnosed with blood tests as well as a medical history and physical exam. Other tests may be needed.  Treatment for this condition depends on the cause of the anemia. This information is not intended to replace advice given to you by  your health care provider. Make sure you discuss any questions you have with your health care provider. Document Revised: 05/08/2017 Document Reviewed: 06/27/2016 Elsevier Patient Education  [place].

## 2019-08-18 NOTE — Progress Notes (Signed)
Acute Office Visit  Subjective:    Patient ID: Richard Ho, male    DOB: [birth year], 79 y.o.   MRN: [id number]  Chief Complaint  Patient presents with  . Anemia    HPI Patient is in today for follow-up after see the opthalmology at the [place]. Concerns arose surrounding the possibility of anemia or uncontrolled DM when blood clots were found in the vessels of the right eye. A1c has found to be 7.0 and hemoglobin was 10.4. She concern of anemia became more clear at that time. The source is unknown. Denies any visual disturbances or changes. No blood in the stools or dark black stools. Last colonoscopy was in 2012.   Past Medical History:  Diagnosis Date  . Asthma   . Coronary artery disease 1991   a) Angioplasty LAD 1991 [place] b) MI in 1995 at  [facility] in [place], med Rx  . Diabetes mellitus   . GERD (gastroesophageal reflux disease)   . Hearing loss   . HTN (hypertension)   . Hyperlipidemia   . Hypothyroidism   . Myocardial infarct (HCC)   . Nephrolithiasis   . Osteopenia   . Snake bite     Past Surgical History:  Procedure Laterality Date  . ANGIOPLASTY  1991  . CARDIAC CATHETERIZATION  2005   Moderate LCx and LAD disease and severe diffuse RCA disease  . CARDIAC CATHETERIZATION  01/2012   normal left main, 50% pLAD stenosis, dLAD mild luminal irregularities, D1 30-40% stenosis at take off; subtotally occluded/severely diseased OM1, mid 80% stenosis in small OM2, long prox and mid RCA stenoses ranging from 60-95%, severely diseased PDA, distal PLSA occluded filling via L-R collateral; normal LV function; inferior lateral basal akinesis.  . CHOLECYSTECTOMY  2012  . EYE SURGERY  2010   cataract - right, has left done too  . INGUINAL HERNIA REPAIR    . LEFT HEART CATHETERIZATION WITH CORONARY ANGIOGRAM N/A 01/28/2012   Procedure: LEFT HEART CATHETERIZATION WITH CORONARY ANGIOGRAM;  Surgeon: Iran Ouch, MD;  [facility]: [facility];  Service: Cardiovascular;   Laterality: N/A;  . LEFT HEART CATHETERIZATION WITH CORONARY ANGIOGRAM N/A 03/17/2014   Procedure: LEFT HEART CATHETERIZATION WITH CORONARY ANGIOGRAM;  Surgeon: Kathleene Hazel, MD;  Location: [facility] CATH LAB;  Service: Cardiovascular;  Laterality: N/A;  . ORIF TIBIA & FIBULA FRACTURES    . PTCA    . SHOULDER ARTHROSCOPY WITH DISTAL CLAVICLE RESECTION Left 12/16/2018   Procedure: LEFT SHOULDER ARTHROSCOPY WITH DISTAL CLAVICLE RESECTION;  Surgeon: Bjorn Pippin, MD;  Location: [facility];  Service: Orthopedics;  Laterality: Left;  . SHOULDER ARTHROSCOPY WITH ROTATOR CUFF REPAIR AND SUBACROMIAL DECOMPRESSION Left 12/16/2018   Procedure: LEFT SHOULDER ARTHROSCOPY WITH ROTATOR CUFF REPAIR AND SUBACROMIAL DECOMPRESSION, DEBRIDEMENT;  Surgeon: Bjorn Pippin, MD;  Location: [facility];  Service: Orthopedics;  Laterality: Left;  . SHOULDER ARTHROSCOPY WITH SUBACROMIAL DECOMPRESSION AND BICEP TENDON REPAIR Left 12/16/2018   Procedure: SHOULDER ARTHROSCOPY WITH SUBACROMIAL DECOMPRESSION AND BICEP TENDON REPAIR;  Surgeon: Bjorn Pippin, MD;  Location: [facility];  Service: Orthopedics;  Laterality: Left;  Marland Kitchen VASECTOMY      Family History  Problem Relation Age of Onset  . Asthma Mother   . Allergies Sister   . Coronary artery disease Other     Social History   Socioeconomic History  . Marital status: Married    Spouse name: Not on file  . Number of children: Not  on file  . Years of education: Not on file  . Highest education level: Not on file  Occupational History  . Occupation: Retired  Tobacco Use  . Smoking status: Never Smoker  . Smokeless tobacco: Never Used  Substance and Sexual Activity  . Alcohol use: Yes    Alcohol/week: 0.0 standard drinks    Comment: occ, once every 2 months  . Drug use: No  . Sexual activity: Not on file  Other Topics Concern  . Not on file  Social History Narrative  . Not on file   Social Determinants of Health    Financial Resource Strain:   . Difficulty of Paying Living Expenses:   Food Insecurity:   . Worried About Programme researcher, broadcasting/film/video in the Last Year:   . Barista in the Last Year:   Transportation Needs:   . Freight forwarder (Medical):   Marland Kitchen Lack of Transportation (Non-Medical):   Physical Activity:   . Days of Exercise per Week:   . Minutes of Exercise per Session:   Stress:   . Feeling of Stress :   Social Connections:   . Frequency of Communication with Friends and Family:   . Frequency of Social Gatherings with Friends and Family:   . Attends Religious Services:   . Active Member of Clubs or Organizations:   . Attends Banker Meetings:   Marland Kitchen Marital Status:   Intimate Partner Violence:   . Fear of Current or Ex-Partner:   . Emotionally Abused:   Marland Kitchen Physically Abused:   . Sexually Abused:     Outpatient Medications Prior to Visit  Medication Sig Dispense Refill  . aspirin EC 81 MG tablet Take 81 mg by mouth daily.    . calcium-vitamin D (OSCAL WITH D) 500-200 MG-UNIT tablet Take 1 tablet by mouth.    . ferrous sulfate 325 (65 FE) MG EC tablet Take 325 mg by mouth 3 (three) times daily with meals.    . fluticasone (FLONASE) 50 MCG/ACT nasal spray USE 1 TO 2 SPRAY(S) IN EACH NOSTRIL ONCE DAILY AS DIRECTED 48 g 0  . fluticasone (FLOVENT HFA) 110 MCG/ACT inhaler Inhale three puffs three times daily during flare-up.  Rinse, gargle, and spit after use. 3 Inhaler 1  . fluticasone furoate-vilanterol (BREO ELLIPTA) 200-25 MCG/INH AEPB Inhale 1 puff into the lungs daily. Rinse, gargle, and spit after use. 180 each 1  . isosorbide mononitrate (IMDUR) 30 MG 24 hr tablet TAKE ONE TABLET BY MOUTH TWICE DAILY 180 tablet 3  . levothyroxine (SYNTHROID) 75 MCG tablet Take 1 tablet (75 mcg total) by mouth daily. 90 tablet 1  . metFORMIN (GLUCOPHAGE) 1000 MG tablet TAKE 1 TABLET BY MOUTH TWICE DAILY WITH MEALS 180 tablet 1  . metoprolol tartrate (LOPRESSOR) 50 MG tablet Take  1 tablet (50 mg total) by mouth 2 (two) times daily. 180 tablet 3  . montelukast (SINGULAIR) 10 MG tablet Take one tablet by mouth once daily as directed. 90 tablet 1  . Multiple Vitamins-Minerals (CENTRUM ADULTS PO) Take by mouth.    . nitroGLYCERIN (NITROSTAT) 0.4 MG SL tablet Place 1 tablet (0.4 mg total) under the tongue every 5 (five) minutes as needed. Chest pain 25 tablet 3  . pantoprazole (PROTONIX) 40 MG tablet Take 1 tablet (40 mg total) by mouth daily. 90 tablet 1  . pregabalin (LYRICA) 75 MG capsule Take 1 capsule by mouth twice daily 60 capsule 0  . simvastatin (ZOCOR) 80 MG tablet  Take 40 mg by mouth at bedtime. Rx from [place]    . tamsulosin (FLOMAX) 0.4 MG CAPS capsule TAKE ONE CAPSULE BY MOUTH DAILY 30 capsule 5  . amoxicillin-clavulanate (AUGMENTIN) 875-125 MG tablet Take 1 tablet by mouth every 12 (twelve) hours. 14 tablet 0   No facility-administered medications prior to visit.    Allergies  Allergen Reactions  . Trazodone And Nefazodone     "makes me freak out"  . Meloxicam Other (See Comments)    "jumping out of my skin" Pt feels anxiety attacks when on this medicine    Review of Systems  Constitutional: Negative.   Eyes: Negative for photophobia, redness, itching and visual disturbance.  All other systems reviewed and are negative.      Objective:    Physical Exam Constitutional:      Appearance: He is obese.  HENT:     Right Ear: Tympanic membrane normal.     Left Ear: Tympanic membrane normal.     Mouth/Throat:     Mouth: Mucous membranes are moist.  Eyes:     Conjunctiva/sclera: Conjunctivae normal.     Pupils: Pupils are equal, round, and reactive to light.  Cardiovascular:     Rate and Rhythm: Normal rate and regular rhythm.     Pulses: Normal pulses.  Pulmonary:     Effort: Pulmonary effort is normal.     Breath sounds: Normal breath sounds.  Genitourinary:    Comments: hemoccult cards sent home for collection Musculoskeletal:         General: Normal range of motion.  Neurological:     General: No focal deficit present.     Mental Status: He is alert and oriented to person, place, and time.  Psychiatric:        Mood and Affect: Mood normal.     BP 110/70 (BP Location: Left Arm, Patient Position: Sitting, Cuff Size: Large)   Pulse (!) 43   Temp (!) 97.1 F (36.2 C) (Temporal)   Wt 224 lb 11.2 oz (101.9 kg)   SpO2 97%   BMI 31.34 kg/m  Wt Readings from Last 3 Encounters:  08/18/19 224 lb 11.2 oz (101.9 kg)  07/19/19 220 lb 9.6 oz (100.1 kg)  05/18/19 228 lb 3.2 oz (103.5 kg)    Health Maintenance Due  Topic Date Due  . FOOT EXAM  02/02/2019    There are no preventive care reminders to display for this patient.   Lab Results  Component Value Date   TSH 6.09 (H) 05/18/2019   Lab Results  Component Value Date   WBC 6.5 05/18/2019   HGB 10.4 (A) 08/18/2019   HCT 35.5 (L) 05/18/2019   MCV 101.2 (H) 05/18/2019   PLT 229.0 05/18/2019   Lab Results  Component Value Date   NA 139 08/12/2019   K 5.0 08/12/2019   CO2 25 08/12/2019   GLUCOSE 103 (H) 08/12/2019   BUN 20 08/12/2019   CREATININE 1.36 08/12/2019   BILITOT 0.6 05/18/2019   ALKPHOS 80 05/18/2019   AST 16 05/18/2019   ALT 19 05/18/2019   PROT 6.1 05/18/2019   ALBUMIN 3.9 05/18/2019   CALCIUM 9.7 08/12/2019   ANIONGAP 12 12/17/2018   GFR 50.61 (L) 08/12/2019   Lab Results  Component Value Date   CHOL 148 07/30/2019   Lab Results  Component Value Date   HDL 44 07/30/2019   Lab Results  Component Value Date   LDLCALC 56 07/30/2019   Lab Results  Component  Value Date   TRIG 239 (A) 07/30/2019   Lab Results  Component Value Date   CHOLHDL 3 05/18/2019   Lab Results  Component Value Date   HGBA1C 7.0 07/30/2019       Assessment & Plan:   Problem List Items Addressed This Visit    Type 2 diabetes mellitus with renal complication ([place])   Morbid obesity ([place])    Other Visit Diagnoses    Anemia, unspecified type    -   Primary   Relevant Orders   POCT hemoglobin (Completed)   POC Hemoccult Bld/Stl (3-Cd Home Screen)     Mason was seen today for anemia.  Diagnoses and all orders for this visit:  Anemia, unspecified type -     POCT hemoglobin -     POC Hemoccult Bld/Stl (3-Cd Home Screen); Future -     CBC with Differential/Platelets -     CMP  Type 2 diabetes mellitus with other kidney complication, unspecified whether long term insulin use (HCC) -     CMP  Morbid obesity ([place]) -     CBC with Differential/Platelets -     CMP   Concerned that his hemoglobin has dropped from 12.3 to 10.4 over the last 1 year. Last colonoscopy was May 2012. Will discuss options pending the results of the hemoccult. Hemoccult cards provided for patient today.     No orders of the defined types were placed in this encounter.    Kennyth Arnold, FNP

## 2019-08-21 ENCOUNTER — Encounter: Payer: Self-pay | Admitting: Family

## 2019-08-23 LAB — VITAMIN B12: Vitamin B-12: 287

## 2019-08-23 LAB — CBC AND DIFFERENTIAL
HCT: 38 — AB (ref 41–53)
Hemoglobin: 12.4 — AB (ref 13.5–17.5)
Platelets: 245 (ref 150–399)
WBC: 7

## 2019-08-23 LAB — IRON,TIBC AND FERRITIN PANEL
Iron: 59
TIBC: 393

## 2019-08-23 LAB — CBC: RBC: 3.75 — AB (ref 3.87–5.11)

## 2019-08-25 ENCOUNTER — Other Ambulatory Visit: Payer: Self-pay

## 2019-08-25 DIAGNOSIS — D649 Anemia, unspecified: Secondary | ICD-10-CM

## 2019-08-25 LAB — POC HEMOCCULT BLD/STL (HOME/3-CARD/SCREEN)
Card #2 Fecal Occult Blod, POC: NEGATIVE
Card #3 Fecal Occult Blood, POC: NEGATIVE
Fecal Occult Blood, POC: NEGATIVE

## 2019-08-25 NOTE — Addendum Note (Signed)
Addended by: Bellamia Ferch on: 08/25/2019 09:53 AM   Modules accepted: Orders  

## 2019-08-25 NOTE — Addendum Note (Signed)
Addended by: Philemon Kingdom on: 08/25/2019 09:53 AM   Modules accepted: Orders

## 2019-08-29 ENCOUNTER — Telehealth: Payer: Self-pay | Admitting: Internal Medicine

## 2019-08-29 DIAGNOSIS — E119 Type 2 diabetes mellitus without complications: Secondary | ICD-10-CM | POA: Diagnosis not present

## 2019-08-29 DIAGNOSIS — E781 Pure hyperglyceridemia: Secondary | ICD-10-CM

## 2019-08-29 DIAGNOSIS — H349 Unspecified retinal vascular occlusion: Secondary | ICD-10-CM

## 2019-08-29 DIAGNOSIS — I252 Old myocardial infarction: Secondary | ICD-10-CM

## 2019-08-29 NOTE — Telephone Encounter (Signed)
Thanks James!

## 2019-08-29 NOTE — Telephone Encounter (Signed)
Orders placed.

## 2019-08-29 NOTE — Telephone Encounter (Signed)
Dr Arnell Asal with [facility] called regarding this patient.  I received his call as DOD.  He thinks that he may have atherosclerotic plaque in his eye and is concerned for possible cardiac source of retinal artery emboli.  He says that it could also be artifact or "drusen". He also reports reginal marginal branch venous occlusion causing recent hemorrhage. He would like for [place] to obtain carotid dopplers and a repeat echo to further evaluate.  Does not feel that if these are normal further evaluation would be required.  He reports that he will follow-up with the patient and would like for results to be sent to our office.  Sherri,  Please order echo and carotid dopplers.

## 2019-08-31 ENCOUNTER — Encounter: Payer: Self-pay | Admitting: Internal Medicine

## 2019-09-02 DIAGNOSIS — N189 Chronic kidney disease, unspecified: Secondary | ICD-10-CM | POA: Diagnosis not present

## 2019-09-02 DIAGNOSIS — N1831 Chronic kidney disease, stage 3a: Secondary | ICD-10-CM | POA: Diagnosis not present

## 2019-09-14 ENCOUNTER — Ambulatory Visit (HOSPITAL_COMMUNITY)
Admission: RE | Admit: 2019-09-14 | Discharge: 2019-09-14 | Disposition: A | Discharge status: Home / Self Care | Payer: Medicare Other | Source: Ambulatory Visit | Attending: Cardiology | Admitting: Cardiology

## 2019-09-14 ENCOUNTER — Ambulatory Visit (HOSPITAL_BASED_OUTPATIENT_CLINIC_OR_DEPARTMENT_OTHER): Payer: Medicare Other

## 2019-09-14 ENCOUNTER — Other Ambulatory Visit: Payer: Self-pay

## 2019-09-14 DIAGNOSIS — E781 Pure hyperglyceridemia: Secondary | ICD-10-CM | POA: Diagnosis not present

## 2019-09-14 DIAGNOSIS — H349 Unspecified retinal vascular occlusion: Secondary | ICD-10-CM | POA: Diagnosis not present

## 2019-09-14 DIAGNOSIS — I252 Old myocardial infarction: Secondary | ICD-10-CM

## 2019-09-15 ENCOUNTER — Other Ambulatory Visit: Payer: Self-pay | Admitting: Nephrology

## 2019-09-15 ENCOUNTER — Other Ambulatory Visit (HOSPITAL_COMMUNITY): Payer: Self-pay | Admitting: Nephrology

## 2019-09-15 ENCOUNTER — Other Ambulatory Visit: Payer: Self-pay

## 2019-09-16 ENCOUNTER — Ambulatory Visit (INDEPENDENT_AMBULATORY_CARE_PROVIDER_SITE_OTHER): Payer: Medicare Other | Admitting: Internal Medicine

## 2019-09-16 ENCOUNTER — Encounter: Payer: Self-pay | Admitting: Internal Medicine

## 2019-09-16 VITALS — BP 100/68 | HR 61 | Temp 97.1°F | Wt 219.1 lb

## 2019-09-16 DIAGNOSIS — E1142 Type 2 diabetes mellitus with diabetic polyneuropathy: Secondary | ICD-10-CM | POA: Diagnosis not present

## 2019-09-16 DIAGNOSIS — I10 Essential (primary) hypertension: Secondary | ICD-10-CM

## 2019-09-16 DIAGNOSIS — E1129 Type 2 diabetes mellitus with other diabetic kidney complication: Secondary | ICD-10-CM | POA: Diagnosis not present

## 2019-09-16 DIAGNOSIS — N1832 Chronic kidney disease, stage 3b: Secondary | ICD-10-CM | POA: Diagnosis not present

## 2019-09-16 DIAGNOSIS — E039 Hypothyroidism, unspecified: Secondary | ICD-10-CM | POA: Diagnosis not present

## 2019-09-16 DIAGNOSIS — E785 Hyperlipidemia, unspecified: Secondary | ICD-10-CM

## 2019-09-16 MED ORDER — PREGABALIN 75 MG PO CAPS: 75.0000 mg | ORAL_CAPSULE | Freq: Two times a day (BID) | ORAL | 1 refills | Status: DC

## 2019-09-16 MED ORDER — METFORMIN HCL 500 MG PO TABS: 500.0000 mg | ORAL_TABLET | Freq: Two times a day (BID) | ORAL | 1 refills | Status: DC

## 2019-09-16 NOTE — Progress Notes (Signed)
Established Patient Office Visit     This visit occurred during the SARS-CoV-2 public health emergency.  Safety protocols were in place, including screening questions prior to the visit, additional usage of staff PPE, and extensive cleaning of exam room while observing appropriate contact time as indicated for disinfecting solutions.    CC/Reason for Visit: 62-month follow-up chronic conditions  HPI: Richard Ho is a 79 y.o. male who is coming in today for the above mentioned reasons. Past Medical History is significant for:   1. Type 2 diabetes, his last A1c was 7.2 in February, he states his CBGs at home range between 107 and 122, he had one low last week at 73 but that is because he was doing yard work and did not eat.  2. Hypertension this has been well controlled at home.  3. Diabetic neuropathy, pain is improved since starting Lyrica last visit, however he has had increasing numbness and issues with balance.  4. Hypothyroidism/TSH was 4.390 in February, he continues on Synthroid.  5. Asthma this appears to be moderately well controlled on ipratropium and as needed albuterol, he tells me he has been seeing an allergist.  6. Left retinal detachment in 2019, followed by ophthalmology, still has some visual floaters but he is overall happy with the results of his surgery.  7. Coronary artery disease this has been stable and asymptomatic, he follows routinely with cardiology.  8. Hyperlipidemia, last LDL was 71 in February he is on a statin.  He is requesting refills of Lyrica and Metformin today.  Since I last saw him he has received his Covid vaccines.  He had an issue with his right eye for his ophthalmologist saw hemorrhage, it seems to have resolved on its own on follow-up.   Past Medical/Surgical History: Past Medical History:  Diagnosis Date  . Asthma   . Coronary artery disease 1991   a) Angioplasty LAD 1991 [place] b) MI in 1995 at  [facility] in [place], med Rx  . Diabetes mellitus   . GERD (gastroesophageal reflux disease)   . Hearing loss   . HTN (hypertension)   . Hyperlipidemia   . Hypothyroidism   . Myocardial infarct (HCC)   . Nephrolithiasis   . Osteopenia   . Snake bite     Past Surgical History:  Procedure Laterality Date  . ANGIOPLASTY  1991  . CARDIAC CATHETERIZATION  2005   Moderate LCx and LAD disease and severe diffuse RCA disease  . CARDIAC CATHETERIZATION  01/2012   normal left main, 50% pLAD stenosis, dLAD mild luminal irregularities, D1 30-40% stenosis at take off; subtotally occluded/severely diseased OM1, mid 80% stenosis in small OM2, long prox and mid RCA stenoses ranging from 60-95%, severely diseased PDA, distal PLSA occluded filling via L-R collateral; normal LV function; inferior lateral basal akinesis.  . CHOLECYSTECTOMY  2012  . EYE SURGERY  2010   cataract - right, has left done too  . INGUINAL HERNIA REPAIR    . LEFT HEART CATHETERIZATION WITH CORONARY ANGIOGRAM N/A 01/28/2012   Procedure: LEFT HEART CATHETERIZATION WITH CORONARY ANGIOGRAM;  Surgeon: Iran Ouch, MD;  Location: [facility] CATH [facility];  Service: Cardiovascular;  Laterality: N/A;  . LEFT HEART CATHETERIZATION WITH CORONARY ANGIOGRAM N/A 03/17/2014   Procedure: LEFT HEART CATHETERIZATION WITH CORONARY ANGIOGRAM;  Surgeon: Kathleene Hazel, MD;  Location: [facility] CATH LAB;  Service: Cardiovascular;  Laterality: N/A;  . ORIF TIBIA & FIBULA FRACTURES    . PTCA    .  SHOULDER ARTHROSCOPY WITH DISTAL CLAVICLE RESECTION Left 12/16/2018   Procedure: LEFT SHOULDER ARTHROSCOPY WITH DISTAL CLAVICLE RESECTION;  Surgeon: Hiram Gash, MD;  Location: [place];  Service: Orthopedics;  Laterality: Left;  . SHOULDER ARTHROSCOPY WITH ROTATOR CUFF REPAIR AND SUBACROMIAL DECOMPRESSION Left 12/16/2018   Procedure: LEFT SHOULDER ARTHROSCOPY WITH ROTATOR CUFF REPAIR AND SUBACROMIAL DECOMPRESSION, DEBRIDEMENT;  Surgeon: Hiram Gash,  MD;  Location: [place];  Service: Orthopedics;  Laterality: Left;  . SHOULDER ARTHROSCOPY WITH SUBACROMIAL DECOMPRESSION AND BICEP TENDON REPAIR Left 12/16/2018   Procedure: SHOULDER ARTHROSCOPY WITH SUBACROMIAL DECOMPRESSION AND BICEP TENDON REPAIR;  Surgeon: Hiram Gash, MD;  Location: [place];  Service: Orthopedics;  Laterality: Left;  Marland Kitchen VASECTOMY      Social History:  reports that he has never smoked. He has never used smokeless tobacco. He reports current alcohol use. He reports that he does not use drugs.  Allergies: Allergies  Allergen Reactions  . Trazodone And Nefazodone     "makes me freak out"  . Meloxicam Other (See Comments)    "jumping out of my skin" Pt feels anxiety attacks when on this medicine    Family History:  Family History  Problem Relation Age of Onset  . Asthma Mother   . Allergies Sister   . Coronary artery disease Other      Current Outpatient Medications:  .  aspirin EC 81 MG tablet, Take 81 mg by mouth daily., Disp: , Rfl:  .  calcium-vitamin D (OSCAL WITH D) 500-200 MG-UNIT tablet, Take 1 tablet by mouth., Disp: , Rfl:  .  ferrous sulfate 325 (65 FE) MG EC tablet, Take 325 mg by mouth 3 (three) times daily with meals., Disp: , Rfl:  .  fluticasone (FLONASE) 50 MCG/ACT nasal spray, USE 1 TO 2 SPRAY(S) IN EACH NOSTRIL ONCE DAILY AS DIRECTED, Disp: 48 g, Rfl: 0 .  fluticasone (FLOVENT HFA) 110 MCG/ACT inhaler, Inhale three puffs three times daily during flare-up.  Rinse, gargle, and spit after use., Disp: 3 Inhaler, Rfl: 1 .  fluticasone furoate-vilanterol (BREO ELLIPTA) 200-25 MCG/INH AEPB, Inhale 1 puff into the lungs daily. Rinse, gargle, and spit after use., Disp: 180 each, Rfl: 1 .  furosemide (LASIX) 20 MG tablet, Take 20 mg by mouth., Disp: , Rfl:  .  isosorbide mononitrate (IMDUR) 30 MG 24 hr tablet, TAKE ONE TABLET BY MOUTH TWICE DAILY, Disp: 180 tablet, Rfl: 3 .  levothyroxine (SYNTHROID) 75 MCG tablet, Take  1 tablet (75 mcg total) by mouth daily., Disp: 90 tablet, Rfl: 1 .  metFORMIN (GLUCOPHAGE) 500 MG tablet, Take 1 tablet (500 mg total) by mouth 2 (two) times daily with a meal., Disp: 180 tablet, Rfl: 1 .  metoprolol tartrate (LOPRESSOR) 50 MG tablet, Take 1 tablet (50 mg total) by mouth 2 (two) times daily., Disp: 180 tablet, Rfl: 3 .  montelukast (SINGULAIR) 10 MG tablet, Take one tablet by mouth once daily as directed., Disp: 90 tablet, Rfl: 1 .  Multiple Vitamins-Minerals (CENTRUM ADULTS PO), Take by mouth., Disp: , Rfl:  .  nitroGLYCERIN (NITROSTAT) 0.4 MG SL tablet, Place 1 tablet (0.4 mg total) under the tongue every 5 (five) minutes as needed. Chest pain, Disp: 25 tablet, Rfl: 3 .  pregabalin (LYRICA) 75 MG capsule, Take 1 capsule (75 mg total) by mouth 2 (two) times daily., Disp: 180 capsule, Rfl: 1 .  simvastatin (ZOCOR) 80 MG tablet, Take 40 mg by mouth at bedtime. Rx from [place],  Disp: , Rfl:  .  tamsulosin (FLOMAX) 0.4 MG CAPS capsule, TAKE ONE CAPSULE BY MOUTH DAILY, Disp: 30 capsule, Rfl: 5 .  pantoprazole (PROTONIX) 40 MG tablet, Take 1 tablet (40 mg total) by mouth daily. (Patient not taking: Reported on 09/16/2019), Disp: 90 tablet, Rfl: 1  Review of Systems:  Constitutional: Denies fever, chills, diaphoresis, appetite change and fatigue.  HEENT: Denies photophobia, eye pain, redness, hearing loss, ear pain, congestion, sore throat, rhinorrhea, sneezing, mouth sores, trouble swallowing, neck pain, neck stiffness and tinnitus.   Respiratory: Denies SOB, DOE, cough, chest tightness,  and wheezing.   Cardiovascular: Denies chest pain, palpitations and leg swelling.  Gastrointestinal: Denies nausea, vomiting, abdominal pain, diarrhea, constipation, blood in stool and abdominal distention.  Genitourinary: Denies dysuria, urgency, frequency, hematuria, flank pain and difficulty urinating.  Endocrine: Denies: hot or cold intolerance, sweats, changes in hair or nails, polyuria,  polydipsia. Musculoskeletal: Denies myalgias, back pain, joint swelling, arthralgias and gait problem.  Skin: Denies pallor, rash and wound.  Neurological: Denies dizziness, seizures, syncope, weakness, light-headedness, numbness and headaches.  Hematological: Denies adenopathy. Easy bruising, personal or family bleeding history  Psychiatric/Behavioral: Denies suicidal ideation, mood changes, confusion, nervousness, sleep disturbance and agitation    Physical Exam: Vitals:   09/16/19 0800  BP: 100/68  Pulse: 61  Temp: (!) 97.1 F (36.2 C)  TempSrc: Temporal  SpO2: 96%  Weight: 219 lb 1.6 oz (99.4 kg)    Body mass index is 30.56 kg/m.   Constitutional: NAD, calm, comfortable Eyes: PERRL, lids and conjunctivae normal ENMT: Mucous membranes are moist.  Respiratory: clear to auscultation bilaterally, no wheezing, no crackles. Normal respiratory effort. No accessory muscle use.  Cardiovascular: Regular rate and rhythm, no murmurs / rubs / gallops. No extremity edema.  Neurologic: Grossly intact and nonfocal  Psychiatric: Normal judgment and insight. Alert and oriented x 3. Normal mood.    Impression and Plan:  Type 2 diabetes mellitus with other kidney complication, unspecified whether long term insulin use (HCC) -Last A1c was 7 in February, refill Metformin today.  Diabetic peripheral neuropathy (HCC)  - Plan: pregabalin (LYRICA) 75 MG capsule  Stage 3b chronic kidney disease -Follows routinely with nephrology.  Acquired hypothyroidism -Last TSH was high at 6.090 in December 2020, he remains on Synthroid, recheck TSH on follow-up.  Essential hypertension -Well-controlled.  Dyslipidemia -Last LDL was 56 in February 2021, he remains on a statin.   Patient Instructions  -Nice seeing you today!!  -See you back in 4 months for your annual physical and wellness visit. Please come in fasting.     Chaya Jan, MD [facility]

## 2019-09-16 NOTE — Patient Instructions (Signed)
-  Nice seeing you today!!  -See you back in 4 months for your annual physical and wellness visit. Please come in fasting.

## 2019-09-23 ENCOUNTER — Other Ambulatory Visit: Payer: Self-pay | Admitting: Nephrology

## 2019-09-23 ENCOUNTER — Other Ambulatory Visit (HOSPITAL_COMMUNITY): Payer: Self-pay | Admitting: Nephrology

## 2019-09-23 DIAGNOSIS — N2581 Secondary hyperparathyroidism of renal origin: Secondary | ICD-10-CM

## 2019-09-30 DIAGNOSIS — H349 Unspecified retinal vascular occlusion: Secondary | ICD-10-CM | POA: Diagnosis not present

## 2019-10-04 ENCOUNTER — Encounter (HOSPITAL_COMMUNITY)
Admission: RE | Admit: 2019-10-04 | Discharge: 2019-10-04 | Disposition: A | Discharge status: Home / Self Care | Payer: Medicare Other | Source: Ambulatory Visit | Attending: Nephrology | Admitting: Nephrology

## 2019-10-04 ENCOUNTER — Other Ambulatory Visit: Payer: Self-pay

## 2019-10-04 DIAGNOSIS — E211 Secondary hyperparathyroidism, not elsewhere classified: Secondary | ICD-10-CM | POA: Diagnosis not present

## 2019-10-04 DIAGNOSIS — N2581 Secondary hyperparathyroidism of renal origin: Secondary | ICD-10-CM | POA: Diagnosis not present

## 2019-10-04 MED ORDER — TECHNETIUM TC 99M SESTAMIBI GENERIC - CARDIOLITE
26.7000 | Freq: Once | INTRAVENOUS | Status: AC | PRN
  Administered 2019-10-04: 26.7 via INTRAVENOUS

## 2019-10-08 ENCOUNTER — Ambulatory Visit (HOSPITAL_COMMUNITY)
Admission: EM | Admit: 2019-10-08 | Discharge: 2019-10-08 | Disposition: A | Discharge status: Home / Self Care | Payer: Medicare Other | Source: Home / Self Care

## 2019-10-08 ENCOUNTER — Other Ambulatory Visit: Payer: Self-pay

## 2019-10-08 ENCOUNTER — Emergency Department (HOSPITAL_COMMUNITY): Payer: Medicare Other

## 2019-10-08 ENCOUNTER — Encounter (HOSPITAL_COMMUNITY): Payer: Self-pay | Admitting: Emergency Medicine

## 2019-10-08 ENCOUNTER — Inpatient Hospital Stay (HOSPITAL_COMMUNITY)
Admission: EM | Admit: 2019-10-08 | Discharge: 2019-10-11 | DRG: 282 | Disposition: A | Discharge status: Home / Self Care | Payer: Medicare Other | Attending: Interventional Cardiology | Admitting: Interventional Cardiology

## 2019-10-08 DIAGNOSIS — Z7982 Long term (current) use of aspirin: Secondary | ICD-10-CM | POA: Diagnosis not present

## 2019-10-08 DIAGNOSIS — Z8249 Family history of ischemic heart disease and other diseases of the circulatory system: Secondary | ICD-10-CM

## 2019-10-08 DIAGNOSIS — H9190 Unspecified hearing loss, unspecified ear: Secondary | ICD-10-CM | POA: Diagnosis present

## 2019-10-08 DIAGNOSIS — E785 Hyperlipidemia, unspecified: Secondary | ICD-10-CM | POA: Diagnosis present

## 2019-10-08 DIAGNOSIS — Z9841 Cataract extraction status, right eye: Secondary | ICD-10-CM

## 2019-10-08 DIAGNOSIS — Z79890 Hormone replacement therapy (postmenopausal): Secondary | ICD-10-CM

## 2019-10-08 DIAGNOSIS — Z87442 Personal history of urinary calculi: Secondary | ICD-10-CM

## 2019-10-08 DIAGNOSIS — Z888 Allergy status to other drugs, medicaments and biological substances status: Secondary | ICD-10-CM | POA: Diagnosis not present

## 2019-10-08 DIAGNOSIS — R600 Localized edema: Secondary | ICD-10-CM | POA: Diagnosis present

## 2019-10-08 DIAGNOSIS — K219 Gastro-esophageal reflux disease without esophagitis: Secondary | ICD-10-CM | POA: Diagnosis not present

## 2019-10-08 DIAGNOSIS — M8580 Other specified disorders of bone density and structure, unspecified site: Secondary | ICD-10-CM | POA: Diagnosis present

## 2019-10-08 DIAGNOSIS — I214 Non-ST elevation (NSTEMI) myocardial infarction: Principal | ICD-10-CM | POA: Diagnosis present

## 2019-10-08 DIAGNOSIS — Z9842 Cataract extraction status, left eye: Secondary | ICD-10-CM

## 2019-10-08 DIAGNOSIS — Z79899 Other long term (current) drug therapy: Secondary | ICD-10-CM

## 2019-10-08 DIAGNOSIS — Z20822 Contact with and (suspected) exposure to covid-19: Secondary | ICD-10-CM | POA: Diagnosis not present

## 2019-10-08 DIAGNOSIS — Z9049 Acquired absence of other specified parts of digestive tract: Secondary | ICD-10-CM | POA: Diagnosis not present

## 2019-10-08 DIAGNOSIS — Z7984 Long term (current) use of oral hypoglycemic drugs: Secondary | ICD-10-CM

## 2019-10-08 DIAGNOSIS — I252 Old myocardial infarction: Secondary | ICD-10-CM

## 2019-10-08 DIAGNOSIS — Z885 Allergy status to narcotic agent status: Secondary | ICD-10-CM

## 2019-10-08 DIAGNOSIS — I493 Ventricular premature depolarization: Secondary | ICD-10-CM | POA: Diagnosis present

## 2019-10-08 DIAGNOSIS — R0602 Shortness of breath: Secondary | ICD-10-CM | POA: Diagnosis not present

## 2019-10-08 DIAGNOSIS — E1129 Type 2 diabetes mellitus with other diabetic kidney complication: Secondary | ICD-10-CM | POA: Diagnosis present

## 2019-10-08 DIAGNOSIS — I10 Essential (primary) hypertension: Secondary | ICD-10-CM | POA: Diagnosis not present

## 2019-10-08 DIAGNOSIS — I25110 Atherosclerotic heart disease of native coronary artery with unstable angina pectoris: Secondary | ICD-10-CM | POA: Diagnosis not present

## 2019-10-08 DIAGNOSIS — E039 Hypothyroidism, unspecified: Secondary | ICD-10-CM | POA: Diagnosis not present

## 2019-10-08 DIAGNOSIS — N183 Chronic kidney disease, stage 3 unspecified: Secondary | ICD-10-CM | POA: Diagnosis present

## 2019-10-08 DIAGNOSIS — R7989 Other specified abnormal findings of blood chemistry: Secondary | ICD-10-CM | POA: Diagnosis not present

## 2019-10-08 DIAGNOSIS — Z9861 Coronary angioplasty status: Secondary | ICD-10-CM | POA: Diagnosis not present

## 2019-10-08 DIAGNOSIS — N1832 Chronic kidney disease, stage 3b: Secondary | ICD-10-CM | POA: Diagnosis not present

## 2019-10-08 DIAGNOSIS — Z825 Family history of asthma and other chronic lower respiratory diseases: Secondary | ICD-10-CM

## 2019-10-08 DIAGNOSIS — I129 Hypertensive chronic kidney disease with stage 1 through stage 4 chronic kidney disease, or unspecified chronic kidney disease: Secondary | ICD-10-CM | POA: Diagnosis not present

## 2019-10-08 DIAGNOSIS — E1121 Type 2 diabetes mellitus with diabetic nephropathy: Secondary | ICD-10-CM | POA: Diagnosis not present

## 2019-10-08 DIAGNOSIS — R778 Other specified abnormalities of plasma proteins: Secondary | ICD-10-CM

## 2019-10-08 DIAGNOSIS — E1122 Type 2 diabetes mellitus with diabetic chronic kidney disease: Secondary | ICD-10-CM | POA: Diagnosis not present

## 2019-10-08 DIAGNOSIS — R072 Precordial pain: Secondary | ICD-10-CM | POA: Diagnosis not present

## 2019-10-08 DIAGNOSIS — R079 Chest pain, unspecified: Secondary | ICD-10-CM | POA: Diagnosis not present

## 2019-10-08 DIAGNOSIS — E1142 Type 2 diabetes mellitus with diabetic polyneuropathy: Secondary | ICD-10-CM | POA: Diagnosis present

## 2019-10-08 DIAGNOSIS — E119 Type 2 diabetes mellitus without complications: Secondary | ICD-10-CM | POA: Diagnosis not present

## 2019-10-08 DIAGNOSIS — I340 Nonrheumatic mitral (valve) insufficiency: Secondary | ICD-10-CM | POA: Diagnosis present

## 2019-10-08 DIAGNOSIS — I2510 Atherosclerotic heart disease of native coronary artery without angina pectoris: Secondary | ICD-10-CM | POA: Diagnosis not present

## 2019-10-08 LAB — BASIC METABOLIC PANEL
Anion gap: 13 (ref 5–15)
BUN: 24 mg/dL — ABNORMAL HIGH (ref 8–23)
CO2: 25 mmol/L (ref 22–32)
Calcium: 9.8 mg/dL (ref 8.9–10.3)
Chloride: 100 mmol/L (ref 98–111)
Creatinine, Ser: 1.75 mg/dL — ABNORMAL HIGH (ref 0.61–1.24)
GFR calc Af Amer: 42 mL/min — ABNORMAL LOW (ref >60)
GFR calc non Af Amer: 36 mL/min — ABNORMAL LOW (ref >60)
Glucose, Bld: 189 mg/dL — ABNORMAL HIGH (ref 70–99)
Potassium: 4.5 mmol/L (ref 3.5–5.1)
Sodium: 138 mmol/L (ref 135–145)

## 2019-10-08 LAB — TROPONIN I (HIGH SENSITIVITY)
Troponin I (High Sensitivity): 101 ng/L (ref <18)
Troponin I (High Sensitivity): 100 ng/L (ref <18)

## 2019-10-08 LAB — CBC
HCT: 37.8 % — ABNORMAL LOW (ref 39.0–52.0)
Hemoglobin: 12.1 g/dL — ABNORMAL LOW (ref 13.0–17.0)
MCH: 32.7 pg (ref 26.0–34.0)
MCHC: 32 g/dL (ref 30.0–36.0)
MCV: 102.2 fL — ABNORMAL HIGH (ref 80.0–100.0)
Platelets: 226 10*3/uL (ref 150–400)
RBC: 3.7 MIL/uL — ABNORMAL LOW (ref 4.22–5.81)
RDW: 13.9 % (ref 11.5–15.5)
WBC: 7.1 10*3/uL (ref 4.0–10.5)
nRBC: 0 % (ref 0.0–0.2)

## 2019-10-08 MED ORDER — ASPIRIN 81 MG PO CHEW
324.0000 mg | CHEWABLE_TABLET | Freq: Once | ORAL | Status: AC
  Administered 2019-10-08: 324 mg via ORAL
  Filled 2019-10-08: qty 4

## 2019-10-08 MED ORDER — ATORVASTATIN CALCIUM 40 MG PO TABS
40.0000 mg | ORAL_TABLET | Freq: Every day | ORAL | Status: DC
  Administered 2019-10-09 – 2019-10-11 (×3): 40 mg via ORAL
  Filled 2019-10-08 (×3): qty 1

## 2019-10-08 MED ORDER — SODIUM CHLORIDE 0.9% FLUSH: 3.0000 mL | Freq: Once | INTRAVENOUS | Status: DC

## 2019-10-08 MED ORDER — METOPROLOL TARTRATE 50 MG PO TABS
50.0000 mg | ORAL_TABLET | Freq: Two times a day (BID) | ORAL | Status: DC
  Administered 2019-10-09 – 2019-10-10 (×5): 50 mg via ORAL
  Filled 2019-10-08 (×5): qty 1

## 2019-10-08 MED ORDER — PANTOPRAZOLE SODIUM 40 MG PO TBEC
40.0000 mg | DELAYED_RELEASE_TABLET | Freq: Every day | ORAL | Status: DC
  Administered 2019-10-09 – 2019-10-11 (×3): 40 mg via ORAL
  Filled 2019-10-08 (×3): qty 1

## 2019-10-08 MED ORDER — LEVOTHYROXINE SODIUM 75 MCG PO TABS
75.0000 ug | ORAL_TABLET | Freq: Every day | ORAL | Status: DC
  Administered 2019-10-09 – 2019-10-11 (×3): 75 ug via ORAL
  Filled 2019-10-08 (×3): qty 1

## 2019-10-08 MED ORDER — ENOXAPARIN SODIUM 100 MG/ML ~~LOC~~ SOLN
1.0000 mg/kg | Freq: Two times a day (BID) | SUBCUTANEOUS | Status: DC
  Administered 2019-10-09 (×3): 100 mg via SUBCUTANEOUS
  Filled 2019-10-08 (×4): qty 1

## 2019-10-08 MED ORDER — NITROGLYCERIN 0.4 MG SL SUBL: 0.4000 mg | SUBLINGUAL_TABLET | SUBLINGUAL | Status: DC | PRN

## 2019-10-08 MED ORDER — PREGABALIN 75 MG PO CAPS
75.0000 mg | ORAL_CAPSULE | Freq: Two times a day (BID) | ORAL | Status: DC
  Administered 2019-10-09 – 2019-10-11 (×6): 75 mg via ORAL
  Filled 2019-10-08 (×6): qty 1

## 2019-10-08 MED ORDER — ASPIRIN EC 81 MG PO TBEC
81.0000 mg | DELAYED_RELEASE_TABLET | Freq: Every day | ORAL | Status: DC
  Administered 2019-10-09: 81 mg via ORAL
  Filled 2019-10-08 (×2): qty 1

## 2019-10-08 MED ORDER — ACETAMINOPHEN 325 MG PO TABS: 650.0000 mg | ORAL_TABLET | ORAL | Status: DC | PRN

## 2019-10-08 MED ORDER — ONDANSETRON HCL 4 MG/2ML IJ SOLN: 4.0000 mg | Freq: Four times a day (QID) | INTRAMUSCULAR | Status: DC | PRN

## 2019-10-08 MED ORDER — FUROSEMIDE 20 MG PO TABS: 20.0000 mg | ORAL_TABLET | Freq: Every day | ORAL | Status: DC

## 2019-10-08 MED ORDER — FLUTICASONE PROPIONATE 50 MCG/ACT NA SUSP
1.0000 | Freq: Every day | NASAL | Status: DC
  Administered 2019-10-09: 2 via NASAL
  Administered 2019-10-10 – 2019-10-11 (×2): 1 via NASAL
  Filled 2019-10-08: qty 16

## 2019-10-08 MED ORDER — INSULIN ASPART 100 UNIT/ML ~~LOC~~ SOLN
0.0000 [IU] | Freq: Three times a day (TID) | SUBCUTANEOUS | Status: DC
  Administered 2019-10-09 – 2019-10-11 (×2): 1 [IU] via SUBCUTANEOUS

## 2019-10-08 MED ORDER — TAMSULOSIN HCL 0.4 MG PO CAPS
0.4000 mg | ORAL_CAPSULE | Freq: Every day | ORAL | Status: DC
  Administered 2019-10-09 – 2019-10-11 (×3): 0.4 mg via ORAL
  Filled 2019-10-08 (×3): qty 1

## 2019-10-08 MED ORDER — ISOSORBIDE MONONITRATE ER 30 MG PO TB24
30.0000 mg | ORAL_TABLET | Freq: Two times a day (BID) | ORAL | Status: DC
  Administered 2019-10-09 – 2019-10-11 (×6): 30 mg via ORAL
  Filled 2019-10-08 (×6): qty 1

## 2019-10-08 NOTE — ED Provider Notes (Signed)
[facility] EMERGENCY DEPARTMENT Provider Note   CSN: [id number] Arrival date & time: 10/08/19  1424     History Chief Complaint  Patient presents with  . Chest Pain    Richard Ho is a 79 y.o. male.  Patient c/o mid chest tightness and sob onset last pm. Symptoms acute onset last pm, at rest, dull/tight, mid chest, non radiating, not pleuritic. No associated nv, or diaphoresis. Mild sob. Felt somewhat similar to prior cardiac cp. Denies other recent cp or discomfort. Denies increased cough. No fevers. No leg pain or swelling. No heartburn or indigestion. States pain currently is resolved.   The history is provided by the patient.  Chest Pain Associated symptoms: no abdominal pain, no back pain, no cough, no fever, no headache, no nausea, no palpitations, no shortness of breath and no vomiting        Past Medical History:  Diagnosis Date  . Asthma   . Coronary artery disease 1991   a) Angioplasty LAD 1991 [place] b) MI in 1995 at  [facility] in [place], med Rx  . Diabetes mellitus   . GERD (gastroesophageal reflux disease)   . Hearing loss   . HTN (hypertension)   . Hyperlipidemia   . Hypothyroidism   . Myocardial infarct (HCC)   . Nephrolithiasis   . Osteopenia   . Snake bite     Patient Active Problem List   Diagnosis Date Noted  . Vitamin D deficiency 05/18/2019  . Hyperkalemia 11/03/2018  . Retinal detachment 04/29/2018  . Morbid obesity (HCC) 04/29/2018  . Diabetes mellitus with renal complications (HCC) 12/21/2015  . After cataract of both eyes not obscuring vision 02/17/2014  . Dry eye syndrome, bilateral 02/17/2014  . PVD (posterior vitreous detachment), both eyes 02/17/2014  . Chronic kidney disease, stage III (moderate) 01/26/2014  . Diabetic peripheral neuropathy (HCC) 11/21/2013  . Pseudophakia of both eyes 08/18/2013  . Pseudophakia, left eye 07/07/2013  . Pseudophakia of right eye 06/24/2013  . After cataract, right  eye 05/02/2013  . Arcus senilis of both corneas 05/02/2013  . Combined senile cataract 05/02/2013  . Dermatochalasis of eyelid 05/02/2013  . PVD (posterior vitreous detachment), right eye 05/02/2013  . Macrocytic anemia 01/29/2012  . Hypothyroidism 10/22/2011  . Type 2 diabetes mellitus with renal complication (HCC) 03/14/2011  . Asthma with exacerbation 06/09/2007  . Essential hypertension 04/08/2007  . Dyslipidemia 05/04/2006  . MYOCARDIAL INFARCTION, HX OF 05/04/2006  . Coronary atherosclerosis 05/04/2006  . Asthma 05/04/2006  . GERD 05/04/2006  . OSTEOPENIA 05/04/2006  . PSA, INCREASED 05/04/2006  . NEPHROLITHIASIS, HX OF 05/04/2006    Past Surgical History:  Procedure Laterality Date  . ANGIOPLASTY  1991  . CARDIAC CATHETERIZATION  2005   Moderate LCx and LAD disease and severe diffuse RCA disease  . CARDIAC CATHETERIZATION  01/2012   normal left main, 50% pLAD stenosis, dLAD mild luminal irregularities, D1 30-40% stenosis at take off; subtotally occluded/severely diseased OM1, mid 80% stenosis in small OM2, long prox and mid RCA stenoses ranging from 60-95%, severely diseased PDA, distal PLSA occluded filling via L-R collateral; normal LV function; inferior lateral basal akinesis.  . CHOLECYSTECTOMY  2012  . EYE SURGERY  2010   cataract - right, has left done too  . INGUINAL HERNIA REPAIR    . LEFT HEART CATHETERIZATION WITH CORONARY ANGIOGRAM N/A 01/28/2012   Procedure: LEFT HEART CATHETERIZATION WITH CORONARY ANGIOGRAM;  Surgeon: Iran Ouch, MD;  Location: [facility] CATH LAB;  Service: Cardiovascular;  Laterality: N/A;  . LEFT HEART CATHETERIZATION WITH CORONARY ANGIOGRAM N/A 03/17/2014   Procedure: LEFT HEART CATHETERIZATION WITH CORONARY ANGIOGRAM;  Surgeon: Kathleene Hazel, MD;  Location: [facility] CATH LAB;  Service: Cardiovascular;  Laterality: N/A;  . ORIF TIBIA & FIBULA FRACTURES    . PTCA    . SHOULDER ARTHROSCOPY WITH DISTAL CLAVICLE RESECTION Left 12/16/2018    Procedure: LEFT SHOULDER ARTHROSCOPY WITH DISTAL CLAVICLE RESECTION;  Surgeon: Bjorn Pippin, MD;  Location: [facility];  Service: Orthopedics;  Laterality: Left;  . SHOULDER ARTHROSCOPY WITH ROTATOR CUFF REPAIR AND SUBACROMIAL DECOMPRESSION Left 12/16/2018   Procedure: LEFT SHOULDER ARTHROSCOPY WITH ROTATOR CUFF REPAIR AND SUBACROMIAL DECOMPRESSION, DEBRIDEMENT;  Surgeon: Bjorn Pippin, MD;  Location: [facility];  Service: Orthopedics;  Laterality: Left;  . SHOULDER ARTHROSCOPY WITH SUBACROMIAL DECOMPRESSION AND BICEP TENDON REPAIR Left 12/16/2018   Procedure: SHOULDER ARTHROSCOPY WITH SUBACROMIAL DECOMPRESSION AND BICEP TENDON REPAIR;  Surgeon: Bjorn Pippin, MD;  Location:  SURGERY CENTER;  Service: Orthopedics;  Laterality: Left;  Marland Kitchen VASECTOMY         Family History  Problem Relation Age of Onset  . Asthma Mother   . Allergies Sister   . Coronary artery disease Other     Social History   Tobacco Use  . Smoking status: Never Smoker  . Smokeless tobacco: Never Used  Substance Use Topics  . Alcohol use: Yes    Alcohol/week: 0.0 standard drinks    Comment: occ, once every 2 months  . Drug use: No    Home Medications Prior to Admission medications   Medication Sig Start Date End Date Taking? Authorizing Provider  aspirin EC 81 MG tablet Take 81 mg by mouth daily.    [provider]  calcium-vitamin D (OSCAL WITH D) 500-200 MG-UNIT tablet Take 1 tablet by mouth.    [provider]  ferrous sulfate 325 (65 FE) MG EC tablet Take 325 mg by mouth 3 (three) times daily with meals.    [provider]  fluticasone (FLONASE) 50 MCG/ACT nasal spray USE 1 TO 2 SPRAY(S) IN EACH NOSTRIL ONCE DAILY AS DIRECTED 05/19/19   Kozlow, Alvira Philips, MD  fluticasone (FLOVENT HFA) 110 MCG/ACT inhaler Inhale three puffs three times daily during flare-up.  Rinse, gargle, and spit after use. 06/29/18   Kozlow, Alvira Philips, MD  fluticasone furoate-vilanterol  (BREO ELLIPTA) 200-25 MCG/INH AEPB Inhale 1 puff into the lungs daily. Rinse, gargle, and spit after use. 07/19/19   Kozlow, Alvira Philips, MD  furosemide (LASIX) 20 MG tablet Take 20 mg by mouth.    [provider]  isosorbide mononitrate (IMDUR) 30 MG 24 hr tablet TAKE ONE TABLET BY MOUTH TWICE DAILY 06/30/16   Gordy Savers, MD  levothyroxine (SYNTHROID) 75 MCG tablet Take 1 tablet (75 mcg total) by mouth daily. 05/18/19   Philip Aspen, Limmie Patricia, MD  metFORMIN (GLUCOPHAGE) 500 MG tablet Take 1 tablet (500 mg total) by mouth 2 (two) times daily with a meal. 09/16/19   Philip Aspen, Limmie Patricia, MD  metoprolol tartrate (LOPRESSOR) 50 MG tablet Take 1 tablet (50 mg total) by mouth 2 (two) times daily. 07/14/18   Philip Aspen, Limmie Patricia, MD  montelukast (SINGULAIR) 10 MG tablet Take one tablet by mouth once daily as directed. 07/19/19   Kozlow, Alvira Philips, MD  Multiple Vitamins-Minerals (CENTRUM ADULTS PO) Take by mouth.    [provider]  nitroGLYCERIN (NITROSTAT) 0.4 MG SL tablet  Place 1 tablet (0.4 mg total) under the tongue every 5 (five) minutes as needed. Chest pain 11/21/13   Marletta Lor, MD  pantoprazole (PROTONIX) 40 MG tablet Take 1 tablet (40 mg total) by mouth daily. Patient not taking: Reported on 09/16/2019 07/19/19   Jiles Prows, MD  pregabalin (LYRICA) 75 MG capsule Take 1 capsule (75 mg total) by mouth 2 (two) times daily. 09/16/19   Isaac Bliss, Rayford Halsted, MD  simvastatin (ZOCOR) 80 MG tablet Take 40 mg by mouth at bedtime. Rx from [place]    [provider]  tamsulosin (FLOMAX) 0.4 MG CAPS capsule TAKE ONE CAPSULE BY MOUTH DAILY 06/26/14   Marletta Lor, MD  traZODone (DESYREL) 50 MG tablet Take 1 tablet (50 mg total) by mouth at bedtime as needed for sleep. Patient not taking: Reported on 05/18/2019 02/16/19 07/01/19  Isaac Bliss, Rayford Halsted, MD    Allergies    Trazodone and nefazodone and Meloxicam  Review of Systems   Review of Systems   Constitutional: Negative for fever.  HENT: Negative for sore throat.   Eyes: Negative for redness.  Respiratory: Negative for cough and shortness of breath.   Cardiovascular: Positive for chest pain. Negative for palpitations and leg swelling.  Gastrointestinal: Negative for abdominal pain, nausea and vomiting.  Genitourinary: Negative for flank pain.  Musculoskeletal: Negative for back pain and neck pain.  Skin: Negative for rash.  Neurological: Negative for headaches.  Hematological: Does not bruise/bleed easily.  Psychiatric/Behavioral: Negative for confusion.    Physical Exam Updated Vital Signs BP (!) 141/94 (BP Location: Left Arm)   Pulse 93   Temp 98.6 F (37 C) (Oral)   Resp 16   Ht 1.778 m (5\' 10" )   Wt 98.9 kg   SpO2 97%   BMI 31.28 kg/m   Physical Exam Vitals and nursing note reviewed.  Constitutional:      Appearance: Normal appearance. He is well-developed.  HENT:     Head: Atraumatic.     Nose: Nose normal.     Mouth/Throat:     Mouth: Mucous membranes are moist.     Pharynx: Oropharynx is clear.  Eyes:     General: No scleral icterus.    Conjunctiva/sclera: Conjunctivae normal.  Neck:     Trachea: No tracheal deviation.  Cardiovascular:     Rate and Rhythm: Normal rate and regular rhythm.     Pulses: Normal pulses.     Heart sounds: Normal heart sounds. No murmur. No friction rub. No gallop.   Pulmonary:     Effort: Pulmonary effort is normal. No accessory muscle usage or respiratory distress.     Breath sounds: Normal breath sounds.  Abdominal:     General: Bowel sounds are normal. There is no distension.     Palpations: Abdomen is soft.     Tenderness: There is no abdominal tenderness. There is no guarding.  Genitourinary:    Comments: No cva tenderness. Musculoskeletal:        General: No swelling or tenderness.     Cervical back: Normal range of motion and neck supple. No rigidity.     Right lower leg: No edema.     Left lower leg: No  edema.  Skin:    General: Skin is warm and dry.     Findings: No rash.  Neurological:     Mental Status: He is alert.     Comments: Alert, speech clear.   Psychiatric:  Mood and Affect: Mood normal.     ED Results / Procedures / Treatments   Labs (all labs ordered are listed, but only abnormal results are displayed) Results for orders placed or performed during the hospital encounter of 10/08/19  Basic metabolic panel  Result Value Ref Range   Sodium 138 135 - 145 mmol/L   Potassium 4.5 3.5 - 5.1 mmol/L   Chloride 100 98 - 111 mmol/L   CO2 25 22 - 32 mmol/L   Glucose, Bld 189 (H) 70 - 99 mg/dL   BUN 24 (H) 8 - 23 mg/dL   Creatinine, Ser 1.611.75 (H) 0.61 - 1.24 mg/dL   Calcium 9.8 8.9 - 09.610.3 mg/dL   GFR calc non Af Amer 36 (L) >60 mL/min   GFR calc Af Amer 42 (L) >60 mL/min   Anion gap 13 5 - 15  CBC  Result Value Ref Range   WBC 7.1 4.0 - 10.5 K/uL   RBC 3.70 (L) 4.22 - 5.81 MIL/uL   Hemoglobin 12.1 (L) 13.0 - 17.0 g/dL   HCT 04.537.8 (L) 40.939.0 - 81.152.0 %   MCV 102.2 (H) 80.0 - 100.0 fL   MCH 32.7 26.0 - 34.0 pg   MCHC 32.0 30.0 - 36.0 g/dL   RDW 91.413.9 78.211.5 - 95.615.5 %   Platelets 226 150 - 400 K/uL   nRBC 0.0 0.0 - 0.2 %  Troponin I (High Sensitivity)  Result Value Ref Range   Troponin I (High Sensitivity) 101 (HH) <18 ng/L  Troponin I (High Sensitivity)  Result Value Ref Range   Troponin I (High Sensitivity) 100 (HH) <18 ng/L   DG Chest 2 View  Result Date: 10/08/2019 CLINICAL DATA:  Shortness of breath, chest pain, diabetes mellitus, asthma EXAM: CHEST - 2 VIEW COMPARISON:  02/29/2020 FINDINGS: Borderline enlargement of cardiac silhouette. Mediastinal contours and pulmonary vascularity normal. Bibasilar atelectasis. Lungs otherwise clear. No infiltrate, pleural effusion or pneumothorax. Mild endplate spur formation thoracic spine. IMPRESSION: Bibasilar atelectasis. Electronically Signed   By: Ulyses SouthwardMark  Boles M.D.   On: 10/08/2019 16:12     EKG EKG  Interpretation  Date/Time:  Saturday Oct 08 2019 15:27:15 EDT Ventricular Rate:  84 PR Interval:  192 QRS Duration: 112 QT Interval:  374 QTC Calculation: 441 R Axis:   -12 Text Interpretation: Sinus rhythm with occasional Premature ventricular complexes Nonspecific ST abnormality Reconfirmed by Cathren LaineSteinl, Daivik Overley ([id number]) on 10/08/2019 4:39:07 PM   Radiology DG Chest 2 View  Result Date: 10/08/2019 CLINICAL DATA:  Shortness of breath, chest pain, diabetes mellitus, asthma EXAM: CHEST - 2 VIEW COMPARISON:  02/29/2020 FINDINGS: Borderline enlargement of cardiac silhouette. Mediastinal contours and pulmonary vascularity normal. Bibasilar atelectasis. Lungs otherwise clear. No infiltrate, pleural effusion or pneumothorax. Mild endplate spur formation thoracic spine. IMPRESSION: Bibasilar atelectasis. Electronically Signed   By: Ulyses SouthwardMark  Boles M.D.   On: 10/08/2019 16:12    Procedures Procedures (including critical care time)  Medications Ordered in ED Medications  sodium chloride flush (NS) 0.9 % injection 3 mL (has no administration in time range)  aspirin chewable tablet 324 mg (has no administration in time range)    ED Course  I have reviewed the triage vital signs and the nursing notes.  Pertinent labs & imaging results that were available during my care of the patient were reviewed by me and considered in my medical decision making (see chart for details).    MDM Rules/Calculators/A&P  Iv ns. Continuous pulse ox and monitor. Stat labs. Ecg. Pcxr.   Reviewed nursing notes and prior charts for additional history. Prior cardiac cath reviewed - medical manage then.   Initial labs reviewed/interpreted by me - initial trop elev. Pt denies current cp - earlier symptoms resolved. Hx CRI, and bun/cr sl increased from prior. Pt feels is eating/drinking normally, normal UO, no nsaid use.   Chewable asa po. Await delta trop.  Given hx cad, symptoms similar to prior cardiac  cp, and elevated trop - will consult cardiology.   Discussed pt and initial troponin with cardiology - they will see in ED/admit.   Additional labs reviewed/interpreted by me - delta trop also elevated 100, but not increased from initial. Chest pain remains resolved.   MDM Number of Diagnoses or Management Options   Amount and/or Complexity of Data Reviewed Clinical lab tests: ordered and reviewed Tests in the radiology section of CPT: ordered and reviewed Tests in the medicine section of CPT: ordered and reviewed Decide to obtain previous medical records or to obtain history from someone other than the patient: yes Obtain history from someone other than the patient: yes Review and summarize past medical records: yes Discuss the patient with other providers: yes  Risk of Complications, Morbidity, and/or Mortality Presenting problems: high Diagnostic procedures: high Management options: high      Final Clinical Impression(s) / ED Diagnoses Final diagnoses:  None    Rx / DC Orders ED Discharge Orders    None       Cathren Laine, MD 10/08/19 1936

## 2019-10-08 NOTE — ED Notes (Signed)
Chest pain today with exertional sob.  No chest pain at presenbt

## 2019-10-08 NOTE — ED Triage Notes (Signed)
Patient is being discharged from the Urgent Care Center and sent to the Emergency Department via wheelchair by staff. Per Joaquin Courts, NP, patient is stable but in need of higher level of care due to ECG changes and hx MI. Patient is aware and verbalizes understanding of plan of care.  Vitals:   10/08/19 1412  BP: 124/70  Pulse: 84  Resp: 17  Temp: 97.9 F (36.6 C)  SpO2: 97%

## 2019-10-08 NOTE — H&P (Signed)
Cardiology Admission History and Physical:   Patient ID: Richard SchoolLaurence S Samson MRN: [id number]; DOB: December 05, 1940   Admission date: 10/08/2019  Primary Care Provider: Philip AspenHernandez Acosta, Limmie PatriciaEstela Y, MD Primary Cardiologist: Verne Carrowhristopher McAlhany, MD  Primary Electrophysiologist:  None   Chief Complaint:  Chest pain  Patient Profile:   Richard Ho is a 79 y.o. male with coronary artery disease status post LAD PCI in 1991, hypertension, hyperlipidemia, type 2 diabetes, and CKD 3 presenting with exertional chest pain and admitted for NSTEMI.   History of Present Illness:   Mr. Tenny CrawRoss has had coronary artery disease since the 1990s and had prior PCI in [place] and in [place].  Last cardiac cath was in October 2015, which demonstrated diffusely diseased RCA with no focal targets and stable moderate CAD of the LAD and circumflex systems.  He has been medically managed since then.  Last echo 1 month ago showed normal LV and RV function, grade 1 diastolic dysfunction, and mild mitral regurgitation.  He has been evaluated recently for progressive CKD and has been started on diuretics for leg edema.  His leg edema has much improved on oral diuretics.  For the past several weeks, he has had progressive dyspnea and chest pressure on exertion.  He states that he does not have symptoms every time he exerts himself, but sometimes has them simply with standing up. Last night, he developed  severe chest discomfort reminiscent of prior MI.  He presented to urgent care this morning where twelve-lead ECG demonstrated sinus rhythm with subtle ST depression in V5 and V6.  He was referred to the ED where high-sensitivity troponins were 100, 101.  Chest pain has been on and off since being in the ED.  He is currently chest pain-free.   Past Medical History:  Diagnosis Date  . Asthma   . Coronary artery disease 1991   a) Angioplasty LAD 1991 [place] b) MI in 1995 at  [facility] in [place], med Rx  . Diabetes  mellitus   . GERD (gastroesophageal reflux disease)   . Hearing loss   . HTN (hypertension)   . Hyperlipidemia   . Hypothyroidism   . Myocardial infarct (HCC)   . Nephrolithiasis   . Osteopenia   . Snake bite     Past Surgical History:  Procedure Laterality Date  . ANGIOPLASTY  1991  . CARDIAC CATHETERIZATION  2005   Moderate LCx and LAD disease and severe diffuse RCA disease  . CARDIAC CATHETERIZATION  01/2012   normal left main, 50% pLAD stenosis, dLAD mild luminal irregularities, D1 30-40% stenosis at take off; subtotally occluded/severely diseased OM1, mid 80% stenosis in small OM2, long prox and mid RCA stenoses ranging from 60-95%, severely diseased PDA, distal PLSA occluded filling via L-R collateral; normal LV function; inferior lateral basal akinesis.  . CHOLECYSTECTOMY  2012  . EYE SURGERY  2010   cataract - right, has left done too  . INGUINAL HERNIA REPAIR    . LEFT HEART CATHETERIZATION WITH CORONARY ANGIOGRAM N/A 01/28/2012   Procedure: LEFT HEART CATHETERIZATION WITH CORONARY ANGIOGRAM;  Surgeon: Iran OuchMuhammad A Arida, MD;  Location: [facility] CATH LAB;  Service: Cardiovascular;  Laterality: N/A;  . LEFT HEART CATHETERIZATION WITH CORONARY ANGIOGRAM N/A 03/17/2014   Procedure: LEFT HEART CATHETERIZATION WITH CORONARY ANGIOGRAM;  Surgeon: Kathleene Hazelhristopher D McAlhany, MD;  [facility];  Service: Cardiovascular;  Laterality: N/A;  . ORIF TIBIA & FIBULA FRACTURES    . PTCA    . SHOULDER ARTHROSCOPY WITH  DISTAL CLAVICLE RESECTION Left 12/16/2018   Procedure: LEFT SHOULDER ARTHROSCOPY WITH DISTAL CLAVICLE RESECTION;  Surgeon: Bjorn Pippin, MD;  Location: [facility];  Service: Orthopedics;  Laterality: Left;  . SHOULDER ARTHROSCOPY WITH ROTATOR CUFF REPAIR AND SUBACROMIAL DECOMPRESSION Left 12/16/2018   Procedure: LEFT SHOULDER ARTHROSCOPY WITH ROTATOR CUFF REPAIR AND SUBACROMIAL DECOMPRESSION, DEBRIDEMENT;  Surgeon: Bjorn Pippin, MD;  Location: [facility];   Service: Orthopedics;  Laterality: Left;  . SHOULDER ARTHROSCOPY WITH SUBACROMIAL DECOMPRESSION AND BICEP TENDON REPAIR Left 12/16/2018   Procedure: SHOULDER ARTHROSCOPY WITH SUBACROMIAL DECOMPRESSION AND BICEP TENDON REPAIR;  Surgeon: Bjorn Pippin, MD;  Location: [facility];  Service: Orthopedics;  Laterality: Left;  Marland Kitchen VASECTOMY       Medications Prior to Admission: Prior to Admission medications   Medication Sig Start Date End Date Taking? Authorizing Provider  aspirin EC 81 MG tablet Take 81 mg by mouth daily.   Yes [provider]  ferrous sulfate 325 (65 FE) MG EC tablet Take 325 mg by mouth daily.    Yes [provider]  fluticasone (FLONASE) 50 MCG/ACT nasal spray USE 1 TO 2 SPRAY(S) IN EACH NOSTRIL ONCE DAILY AS DIRECTED Patient taking differently: Place 1-2 sprays into both nostrils daily. USE 1 TO 2 SPRAY(S) IN EACH NOSTRIL ONCE DAILY AS DIRECTED 05/19/19  Yes Kozlow, Alvira Philips, MD  fluticasone (FLOVENT HFA) 110 MCG/ACT inhaler Inhale three puffs three times daily during flare-up.  Rinse, gargle, and spit after use. Patient taking differently: Inhale 3 puffs into the lungs daily as needed (sob, wheezing). Inhale three puffs three times daily during flare-up.  Rinse, gargle, and spit after use. 06/29/18  Yes Kozlow, Alvira Philips, MD  fluticasone furoate-vilanterol (BREO ELLIPTA) 200-25 MCG/INH AEPB Inhale 1 puff into the lungs daily. Rinse, gargle, and spit after use. 07/19/19  Yes Kozlow, Alvira Philips, MD  furosemide (LASIX) 20 MG tablet Take 20 mg by mouth daily.    Yes [provider]  isosorbide mononitrate (IMDUR) 30 MG 24 hr tablet TAKE ONE TABLET BY MOUTH TWICE DAILY Patient taking differently: Take 30 mg by mouth in the morning and at bedtime.  06/30/16  Yes Gordy Savers, MD  levothyroxine (EUTHYROX) 50 MCG tablet Take 50 mcg by mouth daily before breakfast.   Yes [provider]  levothyroxine (SYNTHROID) 75 MCG tablet Take 1 tablet (75 mcg  total) by mouth daily. Patient taking differently: Take 75 mcg by mouth daily before breakfast.  05/18/19  Yes Philip Aspen, Limmie Patricia, MD  metFORMIN (GLUCOPHAGE) 500 MG tablet Take 1 tablet (500 mg total) by mouth 2 (two) times daily with a meal. 09/16/19  Yes Philip Aspen, Limmie Patricia, MD  metoprolol tartrate (LOPRESSOR) 50 MG tablet Take 1 tablet (50 mg total) by mouth 2 (two) times daily. 07/14/18  Yes Philip Aspen, Limmie Patricia, MD  Multiple Vitamins-Minerals (CENTRUM ADULTS PO) Take 1 tablet by mouth daily.    Yes [provider]  nitroGLYCERIN (NITROSTAT) 0.4 MG SL tablet Place 1 tablet (0.4 mg total) under the tongue every 5 (five) minutes as needed. Chest pain 11/21/13  Yes Gordy Savers, MD  pantoprazole (PROTONIX) 40 MG tablet Take 1 tablet (40 mg total) by mouth daily. 07/19/19  Yes Kozlow, Alvira Philips, MD  pregabalin (LYRICA) 75 MG capsule Take 1 capsule (75 mg total) by mouth 2 (two) times daily. 09/16/19  Yes Philip Aspen, Limmie Patricia, MD  simvastatin (ZOCOR) 80 MG tablet Take 40 mg  by mouth at bedtime. Rx from [place]   Yes [provider]  tamsulosin (FLOMAX) 0.4 MG CAPS capsule TAKE ONE CAPSULE BY MOUTH DAILY Patient taking differently: Take 0.4 mg by mouth daily.  06/26/14  Yes Gordy Savers, MD  montelukast (SINGULAIR) 10 MG tablet Take one tablet by mouth once daily as directed. Patient not taking: Reported on 10/08/2019 07/19/19   Jessica Priest, MD  traZODone (DESYREL) 50 MG tablet Take 1 tablet (50 mg total) by mouth at bedtime as needed for sleep. Patient not taking: Reported on 05/18/2019 02/16/19 07/01/19  Philip Aspen, Limmie Patricia, MD     Allergies:    Allergies  Allergen Reactions  . Trazodone And Nefazodone     "makes me freak out"  . Meloxicam Other (See Comments)    "jumping out of my skin" Pt feels anxiety attacks when on this medicine    Social History:   Social History   Socioeconomic History  . Marital status: Married    Spouse name: Not on  file  . Number of children: Not on file  . Years of education: Not on file  . Highest education level: Not on file  Occupational History  . Occupation: Retired  Tobacco Use  . Smoking status: Never Smoker  . Smokeless tobacco: Never Used  Substance and Sexual Activity  . Alcohol use: Yes    Alcohol/week: 0.0 standard drinks    Comment: occ, once every 2 months  . Drug use: No  . Sexual activity: Not on file  Other Topics Concern  . Not on file  Social History Narrative  . Not on file   Social Determinants of Health   Financial Resource Strain:   . Difficulty of Paying Living Expenses:   Food Insecurity:   . Worried About Programme researcher, broadcasting/film/video in the Last Year:   . Barista in the Last Year:   Transportation Needs:   . Freight forwarder (Medical):   Marland Kitchen Lack of Transportation (Non-Medical):   Physical Activity:   . Days of Exercise per Week:   . Minutes of Exercise per Session:   Stress:   . Feeling of Stress :   Social Connections:   . Frequency of Communication with Friends and Family:   . Frequency of Social Gatherings with Friends and Family:   . Attends Religious Services:   . Active Member of Clubs or Organizations:   . Attends Banker Meetings:   Marland Kitchen Marital Status:   Intimate Partner Violence:   . Fear of Current or Ex-Partner:   . Emotionally Abused:   Marland Kitchen Physically Abused:   . Sexually Abused:     Family History:   The patient's family history includes Allergies in his sister; Asthma in his mother; Coronary artery disease in an other family member.    ROS:  Please see the history of present illness.  All other ROS reviewed and negative.     Physical Exam/Data:   Vitals:   10/08/19 1428 10/08/19 2014 10/08/19 2100  BP: (!) 141/94 (!) 169/64 (!) 164/64  Pulse: 93 (!) 43 (!) 43  Resp: 16 17 12   Temp: 98.6 F (37 C)    TempSrc: Oral    SpO2: 97% 96% 95%  Weight: 98.9 kg    Height: 5\' 10"  (1.778 m)     No intake or output data  in the 24 hours ending 10/08/19 2318 Last 3 Weights 10/08/2019 09/16/2019 08/18/2019  Weight (lbs) 218 lb  219 lb 1.6 oz 224 lb 11.2 oz  Weight (kg) 98.884 kg 99.383 kg 101.923 kg     Body mass index is 31.28 kg/m.  General:  Well nourished, well developed, in no acute distress HEENT: normal Lymph: no adenopathy Neck: no JVD Endocrine:  No thryomegaly Vascular: No carotid bruits; FA pulses 2+ bilaterally without bruits  Cardiac:  normal S1, S2; RRR; no murmur  Lungs:  clear to auscultation bilaterally, no wheezing, rhonchi or rales  Abd: soft, nontender, no hepatomegaly  Ext: trace edema under compression socks Musculoskeletal:  No deformities, BUE and BLE strength normal and equal Skin: warm and dry  Neuro: no focal abnormalities noted Psych:  Normal affect   EKG:  The ECG that was done 10/08/2019 was personally reviewed and demonstrates sinus rhythm with 2 PVCs, very subtle ST depressions in V4, V5, V6  Relevant CV Studies:  Transthoracic echo 09/14/2019 1. Left ventricular ejection fraction, by estimation, is 55 to 60%. The left ventricle has normal function. The left ventricle demonstrates regional wall motion abnormalities (see scoring diagram/findings for description). There is mild concentric left  ventricular hypertrophy. Left ventricular diastolic parameters are consistent with Grade I diastolic dysfunction (impaired relaxation).  2. Right ventricular systolic function is normal. The right ventricular size is normal.  3. Left atrial size was mildly dilated.  4. The mitral valve is normal in structure. Mild mitral valve regurgitation. No evidence of mitral stenosis.  5. The aortic valve is normal in structure. Aortic valve regurgitation is not visualized. No aortic stenosis is present.  6. The inferior vena cava is normal in size with greater than 50% respiratory variability, suggesting right atrial pressure of 3 mmHg.   Cardiac cath 03/17/14: Left Main: No obstructive disease.   Left anterior Descending: Large caliber vessel that courses to the apex. The mid vessel has a 50% stenosis involving the takeoff of both diagonal branches. The distal LAD has minor luminal irregularities. The first diagonal is a moderate caliber vessel with ostial 30-40% stenosis at the ostium.  Left Circumflex: Large vessel that trifurcates quickly into 3 branches. The first OM is small in caliber and is sub-totally occluded proximally, unchanged from last cath. The second OM is small in caliber and has a mid 80% stenosis. The AV groove branch is small and provides collateral flow the the distal RCA.  Right Coronary Artery: Large dominant vessel with proximal 60% stenosis followed by 100% total occlusion in the mid vessel. There are small bridging collaterals that supply the mid and distal vessel. The distal vessel is also seen to fill from left to right collaterals.  Left Ventricular Angiogram: LVEF=45%.   Laboratory Data:  High Sensitivity Troponin:   Recent Labs  Lab 10/08/19 1523 10/08/19 1746  TROPONINIHS 101* 100*      Chemistry Recent Labs  Lab 10/08/19 1523  NA 138  K 4.5  CL 100  CO2 25  GLUCOSE 189*  BUN 24*  CREATININE 1.75*  CALCIUM 9.8  GFRNONAA 36*  GFRAA 42*  ANIONGAP 13    No results for input(s): PROT, ALBUMIN, AST, ALT, ALKPHOS, BILITOT in the last 168 hours. Hematology Recent Labs  Lab 10/08/19 1523  WBC 7.1  RBC 3.70*  HGB 12.1*  HCT 37.8*  MCV 102.2*  MCH 32.7  MCHC 32.0  RDW 13.9  PLT 226   BNPNo results for input(s): BNP, PROBNP in the last 168 hours.  DDimer No results for input(s): DDIMER in the last 168 hours.   Radiology/Studies:  DG Chest 2 View  Result Date: 10/08/2019 CLINICAL DATA:  Shortness of breath, chest pain, diabetes mellitus, asthma EXAM: CHEST - 2 VIEW COMPARISON:  02/29/2020 FINDINGS: Borderline enlargement of cardiac silhouette. Mediastinal contours and pulmonary vascularity normal. Bibasilar atelectasis. Lungs  otherwise clear. No infiltrate, pleural effusion or pneumothorax. Mild endplate spur formation thoracic spine. IMPRESSION: Bibasilar atelectasis. Electronically Signed   By: Ulyses Southward M.D.   On: 10/08/2019 16:12   If the patient is being seen for chest pain, [place], NSTEMI or STEMI Press F2 to calculate a risk score         :[id number]}    TIMI Risk Score for Unstable Angina or Non-ST Elevation MI:   The patient's TIMI risk score is 5, which indicates a 26% risk of all cause mortality, new or recurrent myocardial infarction or need for urgent revascularization in the next 14 days.   Assessment and Plan:   1. NSTEMI He is presenting with crescendo exertional symptoms, reminiscent of prior MI.  He has elevated cardiac biomarkers, and has previously had normal troponin levels as recently as July 2020.  He is currently chest pain-free.  I am concerned for stenosis within LAD or circumflex system which is progressed since 2015.  Alternative explanation would be type II MI in the setting of progressive CKD with failure to clear cardiac troponins.  As he does not have a clear trigger for type II MI, will treat as type I MI and plan for cardiac cath.  -Admit with plans for coronary angiography on Monday -If he develops chest pain, will treat with nitroglycerin drip, continue home Imdur for now -Loaded with aspirin 325 mg in the ED, continue aspirin 81 mg daily -Therapeutic Lovenox twice daily -We will hold P2Y12 inhibitor pending cath findings in case he has surgical disease -Statin: Continue simvastatin 40 mg nightly, check lipid panel -Beta-blocker: Continue home metoprolol tartrate 50 mg twice daily -ACE inhibitor: Hold for now given AKI on CKD  2. CKD stage III, progressive -He appears euvolemic at this time, continue oral Lasix 20 mg daily -Plan for precath hydration  3. Type 2 diabetes -Hold Metformin while inpatient, will use sliding scale insulin if needed  4. Other home medications to  continue -Flonase -Levothyroxine -Singulair if needed -Pantoprazole -Pregabalin -Tamsulosin  Severity of Illness: The appropriate patient status for this patient is INPATIENT. Inpatient status is judged to be reasonable and necessary in order to provide the required intensity of service to ensure the patient's safety. The patient's presenting symptoms, physical exam findings, and initial radiographic and laboratory data in the context of their chronic comorbidities is felt to place them at high risk for further clinical deterioration. Furthermore, it is not anticipated that the patient will be medically stable for discharge from the hospital within 2 midnights of admission. The following factors support the patient status of inpatient.   " The patient's presenting symptoms include chest pain. " The worrisome physical exam findings include none. " The initial radiographic and laboratory data are worrisome because of elevated troponin. " The chronic co-morbidities include coronary artery disease, diabetes.   * I certify that at the point of admission it is my clinical judgment that the patient will require inpatient hospital care spanning beyond 2 midnights from the point of admission due to high intensity of service, high risk for further deterioration and high frequency of surveillance required.*    For questions or updates, please contact [facility] Please consult [email or web address] for contact info under  Signed, Cynda Acres, MD  10/08/2019 11:18 PM

## 2019-10-08 NOTE — ED Triage Notes (Signed)
C/o chest tightness and SOB since last night.  Used inhaler without relief.  Denies nausea/vomiting.

## 2019-10-09 LAB — GLUCOSE, CAPILLARY
Glucose-Capillary: 114 mg/dL — ABNORMAL HIGH (ref 70–99)
Glucose-Capillary: 114 mg/dL — ABNORMAL HIGH (ref 70–99)
Glucose-Capillary: 140 mg/dL — ABNORMAL HIGH (ref 70–99)
Glucose-Capillary: 137 mg/dL — ABNORMAL HIGH (ref 70–99)

## 2019-10-09 LAB — BASIC METABOLIC PANEL
Anion gap: 7 (ref 5–15)
BUN: 24 mg/dL — ABNORMAL HIGH (ref 8–23)
CO2: 27 mmol/L (ref 22–32)
Calcium: 9.6 mg/dL (ref 8.9–10.3)
Chloride: 103 mmol/L (ref 98–111)
Creatinine, Ser: 1.84 mg/dL — ABNORMAL HIGH (ref 0.61–1.24)
GFR calc Af Amer: 40 mL/min — ABNORMAL LOW (ref >60)
GFR calc non Af Amer: 34 mL/min — ABNORMAL LOW (ref >60)
Glucose, Bld: 113 mg/dL — ABNORMAL HIGH (ref 70–99)
Potassium: 5 mmol/L (ref 3.5–5.1)
Sodium: 137 mmol/L (ref 135–145)

## 2019-10-09 LAB — LIPID PANEL
Cholesterol: 143 mg/dL (ref 0–200)
HDL: 38 mg/dL — ABNORMAL LOW (ref >40)
LDL Cholesterol: 68 mg/dL (ref 0–99)
Total CHOL/HDL Ratio: 3.8 RATIO
Triglycerides: 187 mg/dL — ABNORMAL HIGH (ref <150)
VLDL: 37 mg/dL (ref 0–40)

## 2019-10-09 LAB — RESPIRATORY PANEL BY RT PCR (FLU A&B, COVID)
Influenza A by PCR: NEGATIVE
Influenza B by PCR: NEGATIVE
SARS Coronavirus 2 by RT PCR: NEGATIVE

## 2019-10-09 LAB — CBC
HCT: 35.8 % — ABNORMAL LOW (ref 39.0–52.0)
Hemoglobin: 11.7 g/dL — ABNORMAL LOW (ref 13.0–17.0)
MCH: 32.9 pg (ref 26.0–34.0)
MCHC: 32.7 g/dL (ref 30.0–36.0)
MCV: 100.6 fL — ABNORMAL HIGH (ref 80.0–100.0)
Platelets: 218 10*3/uL (ref 150–400)
RBC: 3.56 MIL/uL — ABNORMAL LOW (ref 4.22–5.81)
RDW: 14 % (ref 11.5–15.5)
WBC: 7.7 10*3/uL (ref 4.0–10.5)
nRBC: 0 % (ref 0.0–0.2)

## 2019-10-09 LAB — PROTIME-INR
INR: 1 (ref 0.8–1.2)
Prothrombin Time: 13.2 seconds (ref 11.4–15.2)

## 2019-10-09 LAB — HEMOGLOBIN A1C
Hgb A1c MFr Bld: 7.3 % — ABNORMAL HIGH (ref 4.8–5.6)
Mean Plasma Glucose: 162.81 mg/dL

## 2019-10-09 LAB — BRAIN NATRIURETIC PEPTIDE: B Natriuretic Peptide: 355.1 pg/mL — ABNORMAL HIGH (ref 0.0–100.0)

## 2019-10-09 MED ORDER — SODIUM CHLORIDE 0.9% FLUSH
3.0000 mL | Freq: Two times a day (BID) | INTRAVENOUS | Status: DC
  Administered 2019-10-09 (×2): 3 mL via INTRAVENOUS

## 2019-10-09 MED ORDER — ALPRAZOLAM 0.25 MG PO TABS
0.2500 mg | ORAL_TABLET | Freq: Every evening | ORAL | Status: DC | PRN
  Administered 2019-10-09 – 2019-10-10 (×2): 0.25 mg via ORAL
  Filled 2019-10-09 (×2): qty 1

## 2019-10-09 MED ORDER — ASPIRIN 81 MG PO CHEW
81.0000 mg | CHEWABLE_TABLET | ORAL | Status: AC
  Administered 2019-10-10: 81 mg via ORAL
  Filled 2019-10-09: qty 1

## 2019-10-09 MED ORDER — MELATONIN 5 MG PO TABS
5.0000 mg | ORAL_TABLET | Freq: Every day | ORAL | Status: DC
  Administered 2019-10-09 – 2019-10-10 (×3): 5 mg via ORAL
  Filled 2019-10-09 (×3): qty 1

## 2019-10-09 MED ORDER — SODIUM CHLORIDE 0.9 % IV SOLN: 250.0000 mL | INTRAVENOUS | Status: DC | PRN

## 2019-10-09 MED ORDER — SODIUM CHLORIDE 0.9 % WEIGHT BASED INFUSION
1.0000 mL/kg/h | INTRAVENOUS | Status: DC
  Administered 2019-10-09 – 2019-10-10 (×2): 1 mL/kg/h via INTRAVENOUS

## 2019-10-09 MED ORDER — SODIUM CHLORIDE 0.9% FLUSH: 3.0000 mL | INTRAVENOUS | Status: DC | PRN

## 2019-10-09 MED ORDER — DIPHENHYDRAMINE HCL 25 MG PO CAPS: 25.0000 mg | ORAL_CAPSULE | Freq: Every evening | ORAL | Status: DC | PRN

## 2019-10-09 NOTE — Progress Notes (Signed)
Patient admitted with unstable angina/non-ST elevation myocardial infarction.  He is pain-free.  Continue heparin, aspirin, beta-blocker and statin.  Plan is for cardiac catheterization tomorrow.  The risks and benefits including myocardial infarction, CVA, death and worsening renal function discussed and he agrees to proceed.  Hold Lasix and hydrate prior to procedure.  No ventriculogram.  Recent echocardiogram showed preserved LV function.  Follow renal function closely after procedure. Olga Millers

## 2019-10-10 ENCOUNTER — Encounter (HOSPITAL_COMMUNITY)
Admission: EM | Disposition: A | Discharge status: Home / Self Care | Payer: Self-pay | Source: Home / Self Care | Attending: Cardiology

## 2019-10-10 DIAGNOSIS — I214 Non-ST elevation (NSTEMI) myocardial infarction: Principal | ICD-10-CM

## 2019-10-10 DIAGNOSIS — I2510 Atherosclerotic heart disease of native coronary artery without angina pectoris: Secondary | ICD-10-CM

## 2019-10-10 HISTORY — PX: LEFT HEART CATH AND CORONARY ANGIOGRAPHY: CATH118249

## 2019-10-10 HISTORY — PX: CORONARY PRESSURE/FFR STUDY: CATH118243

## 2019-10-10 LAB — CBC
HCT: 34.5 % — ABNORMAL LOW (ref 39.0–52.0)
Hemoglobin: 11.2 g/dL — ABNORMAL LOW (ref 13.0–17.0)
MCH: 32.7 pg (ref 26.0–34.0)
MCHC: 32.5 g/dL (ref 30.0–36.0)
MCV: 100.9 fL — ABNORMAL HIGH (ref 80.0–100.0)
Platelets: 215 10*3/uL (ref 150–400)
RBC: 3.42 MIL/uL — ABNORMAL LOW (ref 4.22–5.81)
RDW: 13.8 % (ref 11.5–15.5)
WBC: 7.1 10*3/uL (ref 4.0–10.5)
nRBC: 0 % (ref 0.0–0.2)

## 2019-10-10 LAB — BASIC METABOLIC PANEL
Anion gap: 7 (ref 5–15)
BUN: 22 mg/dL (ref 8–23)
CO2: 25 mmol/L (ref 22–32)
Calcium: 9.5 mg/dL (ref 8.9–10.3)
Chloride: 105 mmol/L (ref 98–111)
Creatinine, Ser: 1.74 mg/dL — ABNORMAL HIGH (ref 0.61–1.24)
GFR calc Af Amer: 43 mL/min — ABNORMAL LOW (ref >60)
GFR calc non Af Amer: 37 mL/min — ABNORMAL LOW (ref >60)
Glucose, Bld: 112 mg/dL — ABNORMAL HIGH (ref 70–99)
Potassium: 4.9 mmol/L (ref 3.5–5.1)
Sodium: 137 mmol/L (ref 135–145)

## 2019-10-10 LAB — GLUCOSE, CAPILLARY
Glucose-Capillary: 102 mg/dL — ABNORMAL HIGH (ref 70–99)
Glucose-Capillary: 104 mg/dL — ABNORMAL HIGH (ref 70–99)
Glucose-Capillary: 93 mg/dL (ref 70–99)
Glucose-Capillary: 91 mg/dL (ref 70–99)

## 2019-10-10 LAB — POCT ACTIVATED CLOTTING TIME
Activated Clotting Time: 230 seconds
Activated Clotting Time: 274 seconds

## 2019-10-10 SURGERY — LEFT HEART CATH AND CORONARY ANGIOGRAPHY
Anesthesia: LOCAL

## 2019-10-10 MED ORDER — HEPARIN SODIUM (PORCINE) 1000 UNIT/ML IJ SOLN
INTRAMUSCULAR | Status: DC | PRN
  Administered 2019-10-10 (×3): 5000 [IU] via INTRAVENOUS

## 2019-10-10 MED ORDER — VERAPAMIL HCL 2.5 MG/ML IV SOLN
INTRA_ARTERIAL | Status: DC | PRN
  Administered 2019-10-10: 7 mL via INTRA_ARTERIAL

## 2019-10-10 MED ORDER — IOHEXOL 350 MG/ML SOLN
INTRAVENOUS | Status: DC | PRN
  Administered 2019-10-10: 100 mL

## 2019-10-10 MED ORDER — VERAPAMIL HCL 2.5 MG/ML IV SOLN
INTRAVENOUS | Status: AC
  Filled 2019-10-10: qty 2

## 2019-10-10 MED ORDER — LABETALOL HCL 5 MG/ML IV SOLN: 10.0000 mg | INTRAVENOUS | Status: AC | PRN

## 2019-10-10 MED ORDER — MIDAZOLAM HCL 2 MG/2ML IJ SOLN
INTRAMUSCULAR | Status: DC | PRN
  Administered 2019-10-10: 1 mg via INTRAVENOUS

## 2019-10-10 MED ORDER — LIDOCAINE HCL (PF) 1 % IJ SOLN
INTRAMUSCULAR | Status: DC | PRN
  Administered 2019-10-10: 3 mL

## 2019-10-10 MED ORDER — MIDAZOLAM HCL 2 MG/2ML IJ SOLN
INTRAMUSCULAR | Status: AC
  Filled 2019-10-10: qty 2

## 2019-10-10 MED ORDER — ADENOSINE (DIAGNOSTIC) 140MCG/KG/MIN
INTRAVENOUS | Status: DC | PRN
  Administered 2019-10-10: 140 ug/kg/min via INTRAVENOUS

## 2019-10-10 MED ORDER — ASPIRIN 81 MG PO CHEW
81.0000 mg | CHEWABLE_TABLET | Freq: Every day | ORAL | Status: DC
  Administered 2019-10-11: 81 mg via ORAL
  Filled 2019-10-10: qty 1

## 2019-10-10 MED ORDER — FENTANYL CITRATE (PF) 100 MCG/2ML IJ SOLN
INTRAMUSCULAR | Status: DC | PRN
  Administered 2019-10-10: 25 ug via INTRAVENOUS

## 2019-10-10 MED ORDER — MORPHINE SULFATE (PF) 2 MG/ML IV SOLN: 2.0000 mg | INTRAVENOUS | Status: DC | PRN

## 2019-10-10 MED ORDER — HYDRALAZINE HCL 20 MG/ML IJ SOLN: 10.0000 mg | INTRAMUSCULAR | Status: AC | PRN

## 2019-10-10 MED ORDER — ADENOSINE 12 MG/4ML IV SOLN
INTRAVENOUS | Status: AC
  Filled 2019-10-10: qty 16

## 2019-10-10 MED ORDER — ACETAMINOPHEN 325 MG PO TABS: 650.0000 mg | ORAL_TABLET | ORAL | Status: DC | PRN

## 2019-10-10 MED ORDER — HEPARIN SODIUM (PORCINE) 1000 UNIT/ML IJ SOLN
INTRAMUSCULAR | Status: AC
  Filled 2019-10-10: qty 1

## 2019-10-10 MED ORDER — ONDANSETRON HCL 4 MG/2ML IJ SOLN: 4.0000 mg | Freq: Four times a day (QID) | INTRAMUSCULAR | Status: DC | PRN

## 2019-10-10 MED ORDER — ASPIRIN 81 MG PO CHEW: 81.0000 mg | CHEWABLE_TABLET | Freq: Every day | ORAL | Status: DC

## 2019-10-10 MED ORDER — NITROGLYCERIN 1 MG/10 ML FOR IR/CATH LAB
INTRA_ARTERIAL | Status: AC
  Filled 2019-10-10: qty 10

## 2019-10-10 MED ORDER — SODIUM CHLORIDE 0.9% FLUSH
3.0000 mL | Freq: Two times a day (BID) | INTRAVENOUS | Status: DC
  Administered 2019-10-11: 3 mL via INTRAVENOUS

## 2019-10-10 MED ORDER — LIDOCAINE HCL (PF) 1 % IJ SOLN
INTRAMUSCULAR | Status: AC
  Filled 2019-10-10: qty 30

## 2019-10-10 MED ORDER — FENTANYL CITRATE (PF) 100 MCG/2ML IJ SOLN
INTRAMUSCULAR | Status: AC
  Filled 2019-10-10: qty 2

## 2019-10-10 MED ORDER — HEPARIN (PORCINE) IN NACL 1000-0.9 UT/500ML-% IV SOLN
INTRAVENOUS | Status: AC
  Filled 2019-10-10: qty 1000

## 2019-10-10 MED ORDER — HEPARIN (PORCINE) IN NACL 1000-0.9 UT/500ML-% IV SOLN
INTRAVENOUS | Status: DC | PRN
  Administered 2019-10-10 (×2): 500 mL

## 2019-10-10 MED ORDER — SODIUM CHLORIDE 0.9 % IV SOLN: INTRAVENOUS | Status: AC

## 2019-10-10 MED ORDER — SODIUM CHLORIDE 0.9 % IV SOLN: 250.0000 mL | INTRAVENOUS | Status: DC | PRN

## 2019-10-10 MED ORDER — SODIUM CHLORIDE 0.9% FLUSH: 3.0000 mL | INTRAVENOUS | Status: DC | PRN

## 2019-10-10 SURGICAL SUPPLY — 15 items
CATH INFINITI 5FR ANG PIGTAIL (CATHETERS) ×1 IMPLANT
CATH OPTITORQUE TIG 4.0 5F (CATHETERS) ×1 IMPLANT
CATH VISTA GUIDE 6FR XB3 (CATHETERS) ×1 IMPLANT
CATH VISTA GUIDE 6FR XBLAD3.0 (CATHETERS) ×1 IMPLANT
DEVICE RAD COMP TR BAND LRG (VASCULAR PRODUCTS) ×1 IMPLANT
GLIDESHEATH SLEND A-KIT 6F 22G (SHEATH) ×1 IMPLANT
GUIDEWIRE INQWIRE 1.5J.035X260 (WIRE) IMPLANT
GUIDEWIRE PRESSURE X 175 (WIRE) ×1 IMPLANT
INQWIRE 1.5J .035X260CM (WIRE) ×2
KIT ESSENTIALS PG (KITS) ×1 IMPLANT
KIT HEART LEFT (KITS) ×2 IMPLANT
PACK CARDIAC CATHETERIZATION (CUSTOM PROCEDURE TRAY) ×2 IMPLANT
TRANSDUCER W/STOPCOCK (MISCELLANEOUS) ×2 IMPLANT
TUBING CIL FLEX 10 FLL-RA (TUBING) ×2 IMPLANT
WIRE HI TORQ VERSACORE-J 145CM (WIRE) ×1 IMPLANT

## 2019-10-10 NOTE — H&P (View-Only) (Signed)
 Progress Note  Patient Name: Richard Ho Date of Encounter: 10/10/2019  Primary Cardiologist: Christopher McAlhany, MD   Subjective   Doing fine this morning. He denies any chest pain. Reports it was chest pressure that brought him to the ED. He also states it was very different than prior MI which was more of an acid reflux burning chest pain. He denies any reflux complaints recently. No complaints of SOB or palpitations.   Inpatient Medications    Scheduled Meds: . aspirin EC  81 mg Oral Daily  . atorvastatin  40 mg Oral Daily  . enoxaparin (LOVENOX) injection  1 mg/kg Subcutaneous Q12H  . fluticasone  1-2 spray Each Nare Daily  . insulin aspart  0-9 Units Subcutaneous TID WC  . isosorbide mononitrate  30 mg Oral BID  . levothyroxine  75 mcg Oral Q0600  . melatonin  5 mg Oral QHS  . metoprolol tartrate  50 mg Oral BID  . pantoprazole  40 mg Oral Daily  . pregabalin  75 mg Oral BID  . sodium chloride flush  3 mL Intravenous Q12H  . tamsulosin  0.4 mg Oral Daily   Continuous Infusions: . sodium chloride    . sodium chloride 1 mL/kg/hr (10/10/19 0749)   PRN Meds: sodium chloride, acetaminophen, ALPRAZolam, diphenhydrAMINE, nitroGLYCERIN, ondansetron (ZOFRAN) IV, sodium chloride flush   Vital Signs    Vitals:   10/09/19 1224 10/09/19 1727 10/09/19 1953 10/10/19 0626  BP: 135/89 (!) 151/65 (!) 154/88 136/87  Pulse:  86 86 87  Resp:   20 20  Temp: (!) 97.5 F (36.4 C) 97.6 F (36.4 C) 98.3 F (36.8 C) (!) 97.4 F (36.3 C)  TempSrc: Oral Oral Oral Oral  SpO2: 97% 98% 95% 98%  Weight:    97.4 kg  Height:        Intake/Output Summary (Last 24 hours) at 10/10/2019 0819 Last data filed at 10/10/2019 0658 Gross per 24 hour  Intake 957.68 ml  Output 1375 ml  Net -417.32 ml   Filed Weights   10/09/19 0116 10/09/19 0535 10/10/19 0626  Weight: 98.9 kg 98.9 kg 97.4 kg    Telemetry    Sinus rhythm with intermittent bigeminy - Personally Reviewed  ECG    No new  tracings - Personally Reviewed  Physical Exam   GEN: No acute distress.   Neck: No JVD, no carotid bruits Cardiac: RRR, no murmurs, rubs, or gallops.  Respiratory: Clear to auscultation bilaterally, no wheezes/ rales/ rhonchi GI: NABS, Soft, nontender, non-distended  MS: Trace LE edema R>L; No deformity. Neuro:  Nonfocal, moving all extremities spontaneously Psych: Normal affect   Labs    Chemistry Recent Labs  Lab 10/08/19 1523 10/09/19 0511 10/10/19 0411  NA 138 137 137  K 4.5 5.0 4.9  CL 100 103 105  CO2 25 27 25  GLUCOSE 189* 113* 112*  BUN 24* 24* 22  CREATININE 1.75* 1.84* 1.74*  CALCIUM 9.8 9.6 9.5  GFRNONAA 36* 34* 37*  GFRAA 42* 40* 43*  ANIONGAP 13 7 7     Hematology Recent Labs  Lab 10/08/19 1523 10/09/19 0511 10/10/19 0411  WBC 7.1 7.7 7.1  RBC 3.70* 3.56* 3.42*  HGB 12.1* 11.7* 11.2*  HCT 37.8* 35.8* 34.5*  MCV 102.2* 100.6* 100.9*  MCH 32.7 32.9 32.7  MCHC 32.0 32.7 32.5  RDW 13.9 14.0 13.8  PLT 226 218 215    Cardiac EnzymesNo results for input(s): TROPONINI in the last 168 hours. No results for   input(s): TROPIPOC in the last 168 hours.   BNP Recent Labs  Lab 10/09/19 0100  BNP 355.1*     DDimer No results for input(s): DDIMER in the last 168 hours.   Radiology    DG Chest 2 View  Result Date: 10/08/2019 CLINICAL DATA:  Shortness of breath, chest pain, diabetes mellitus, asthma EXAM: CHEST - 2 VIEW COMPARISON:  02/29/2020 FINDINGS: Borderline enlargement of cardiac silhouette. Mediastinal contours and pulmonary vascularity normal. Bibasilar atelectasis. Lungs otherwise clear. No infiltrate, pleural effusion or pneumothorax. Mild endplate spur formation thoracic spine. IMPRESSION: Bibasilar atelectasis. Electronically Signed   By: Lavonia Dana M.D.   On: 10/08/2019 16:12    Cardiac Studies   Echocardiogram 09/14/19: 1. Left ventricular ejection fraction, by estimation, is 55 to 60%. The  left ventricle has normal function. The left  ventricle demonstrates  regional wall motion abnormalities (see scoring diagram/findings for  description). There is mild concentric left  ventricular hypertrophy. Left ventricular diastolic parameters are  consistent with Grade I diastolic dysfunction (impaired relaxation).  2. Right ventricular systolic function is normal. The right ventricular  size is normal.  3. Left atrial size was mildly dilated.  4. The mitral valve is normal in structure. Mild mitral valve  regurgitation. No evidence of mitral stenosis.  5. The aortic valve is normal in structure. Aortic valve regurgitation is  not visualized. No aortic stenosis is present.  6. The inferior vena cava is normal in size with greater than 50%  respiratory variability, suggesting right atrial pressure of 3 mmHg.   Patient Profile     79 y.o. male with PMH of CAD s/p PCI to LAD in 1991, HTN, HLD, DM type 2, hypothyroidism, and CKD stage 3, who presented with exertional chest pain, found to have an NSTEMI.   Assessment & Plan    1. Unstable angina in patient with CAD s/p PCI to LAD in 1991: patient presented with chest pain reminiscent of prior MI. EKG was non-ischemic. HsTrop 101>100. He was given aspirin 325mg , then continued 81mg  daily. He is planned for LHC today to further evaluate symptoms. His simvastatin 80mg  was transitioned to atorvastatin 40mg  daily. He was started on therapeutic lovenox. - Await LHC this afternoon - will hold afternoon lovenox injection in anticipation of cath. - Continue aspirin and statin - Continue metoprolol and imdur  2. HTN: BP generally above goal - Could consider addition of amlodipine vs transition to carvedilol if remains above goal - Continue metoprolol  3. HLD: LDL 68 this admission. Simvastatin transitioned to atorvastatin - Continue atorvastatin 40mg  daily  4. DM type 2: A1C 7.3 . Home metformin held in anticipation of LHC - Continue ISS while inpatient - Anticipate resuming  metformin 48 hr after cath if Cr stable  5. CKD stage 3: Cr 1.74 today; perhaps up a bit from baseline. He follows with Dr. Carolin Sicks outpatient - Continue to monitor closely post-cath  6. LE edema: on lasix 20mg  daily at home, currently on hold in anticipation of LHC.  - Anticipate resuming lasix at discharge  For questions or updates, please contact [place] Please consult [email or web address] for contact info under Cardiology/STEMI.      Signed, Abigail Butts, PA-C  10/10/2019, 8:19 AM   [phone]  I have examined the patient and reviewed assessment and plan and discussed with patient.  Agree with above as stated.  Lying flat without shortness of breath.  Plan for cath today.  No LV gram given renal insufficiency.  Further plans  based on cath.   Lance Muss

## 2019-10-10 NOTE — Progress Notes (Addendum)
Progress Note  Patient Name: Richard Ho Date of Encounter: 10/10/2019  Primary Cardiologist: Verne Carrow, MD   Subjective   Doing fine this morning. He denies any chest pain. Reports it was chest pressure that brought him to the ED. He also states it was very different than prior MI which was more of an acid reflux burning chest pain. He denies any reflux complaints recently. No complaints of SOB or palpitations.   Inpatient Medications    Scheduled Meds: . aspirin EC  81 mg Oral Daily  . atorvastatin  40 mg Oral Daily  . enoxaparin (LOVENOX) injection  1 mg/kg Subcutaneous Q12H  . fluticasone  1-2 spray Each Nare Daily  . insulin aspart  0-9 Units Subcutaneous TID WC  . isosorbide mononitrate  30 mg Oral BID  . levothyroxine  75 mcg Oral Q0600  . melatonin  5 mg Oral QHS  . metoprolol tartrate  50 mg Oral BID  . pantoprazole  40 mg Oral Daily  . pregabalin  75 mg Oral BID  . sodium chloride flush  3 mL Intravenous Q12H  . tamsulosin  0.4 mg Oral Daily   Continuous Infusions: . sodium chloride    . sodium chloride 1 mL/kg/hr (10/10/19 0749)   PRN Meds: sodium chloride, acetaminophen, ALPRAZolam, diphenhydrAMINE, nitroGLYCERIN, ondansetron (ZOFRAN) IV, sodium chloride flush   Vital Signs    Vitals:   10/09/19 1224 10/09/19 1727 10/09/19 1953 10/10/19 0626  BP: 135/89 (!) 151/65 (!) 154/88 136/87  Pulse:  86 86 87  Resp:   20 20  Temp: (!) 97.5 F (36.4 C) 97.6 F (36.4 C) 98.3 F (36.8 C) (!) 97.4 F (36.3 C)  TempSrc: Oral Oral Oral Oral  SpO2: 97% 98% 95% 98%  Weight:    97.4 kg  Height:        Intake/Output Summary (Last 24 hours) at 10/10/2019 0819 Last data filed at 10/10/2019 0658 Gross per 24 hour  Intake 957.68 ml  Output 1375 ml  Net -417.32 ml   Filed Weights   10/09/19 0116 10/09/19 0535 10/10/19 0626  Weight: 98.9 kg 98.9 kg 97.4 kg    Telemetry    Sinus rhythm with intermittent bigeminy - Personally Reviewed  ECG    No new  tracings - Personally Reviewed  Physical Exam   GEN: No acute distress.   Neck: No JVD, no carotid bruits Cardiac: RRR, no murmurs, rubs, or gallops.  Respiratory: Clear to auscultation bilaterally, no wheezes/ rales/ rhonchi GI: NABS, Soft, nontender, non-distended  MS: Trace LE edema R>L; No deformity. Neuro:  Nonfocal, moving all extremities spontaneously Psych: Normal affect   Labs    Chemistry Recent Labs  Lab 10/08/19 1523 10/09/19 0511 10/10/19 0411  NA 138 137 137  K 4.5 5.0 4.9  CL 100 103 105  CO2 25 27 25   GLUCOSE 189* 113* 112*  BUN 24* 24* 22  CREATININE 1.75* 1.84* 1.74*  CALCIUM 9.8 9.6 9.5  GFRNONAA 36* 34* 37*  GFRAA 42* 40* 43*  ANIONGAP 13 7 7      Hematology Recent Labs  Lab 10/08/19 1523 10/09/19 0511 10/10/19 0411  WBC 7.1 7.7 7.1  RBC 3.70* 3.56* 3.42*  HGB 12.1* 11.7* 11.2*  HCT 37.8* 35.8* 34.5*  MCV 102.2* 100.6* 100.9*  MCH 32.7 32.9 32.7  MCHC 32.0 32.7 32.5  RDW 13.9 14.0 13.8  PLT 226 218 215    Cardiac EnzymesNo results for input(s): TROPONINI in the last 168 hours. No results for  input(s): TROPIPOC in the last 168 hours.   BNP Recent Labs  Lab 10/09/19 0100  BNP 355.1*     DDimer No results for input(s): DDIMER in the last 168 hours.   Radiology    DG Chest 2 View  Result Date: 10/08/2019 CLINICAL DATA:  Shortness of breath, chest pain, diabetes mellitus, asthma EXAM: CHEST - 2 VIEW COMPARISON:  02/29/2020 FINDINGS: Borderline enlargement of cardiac silhouette. Mediastinal contours and pulmonary vascularity normal. Bibasilar atelectasis. Lungs otherwise clear. No infiltrate, pleural effusion or pneumothorax. Mild endplate spur formation thoracic spine. IMPRESSION: Bibasilar atelectasis. Electronically Signed   By: Lavonia Dana M.D.   On: 10/08/2019 16:12    Cardiac Studies   Echocardiogram 09/14/19: 1. Left ventricular ejection fraction, by estimation, is 55 to 60%. The  left ventricle has normal function. The left  ventricle demonstrates  regional wall motion abnormalities (see scoring diagram/findings for  description). There is mild concentric left  ventricular hypertrophy. Left ventricular diastolic parameters are  consistent with Grade I diastolic dysfunction (impaired relaxation).  2. Right ventricular systolic function is normal. The right ventricular  size is normal.  3. Left atrial size was mildly dilated.  4. The mitral valve is normal in structure. Mild mitral valve  regurgitation. No evidence of mitral stenosis.  5. The aortic valve is normal in structure. Aortic valve regurgitation is  not visualized. No aortic stenosis is present.  6. The inferior vena cava is normal in size with greater than 50%  respiratory variability, suggesting right atrial pressure of 3 mmHg.   Patient Profile     79 y.o. male with PMH of CAD s/p PCI to LAD in 1991, HTN, HLD, DM type 2, hypothyroidism, and CKD stage 3, who presented with exertional chest pain, found to have an NSTEMI.   Assessment & Plan    1. Unstable angina in patient with CAD s/p PCI to LAD in 1991: patient presented with chest pain reminiscent of prior MI. EKG was non-ischemic. HsTrop 101>100. He was given aspirin 325mg , then continued 81mg  daily. He is planned for LHC today to further evaluate symptoms. His simvastatin 80mg  was transitioned to atorvastatin 40mg  daily. He was started on therapeutic lovenox. - Await LHC this afternoon - will hold afternoon lovenox injection in anticipation of cath. - Continue aspirin and statin - Continue metoprolol and imdur  2. HTN: BP generally above goal - Could consider addition of amlodipine vs transition to carvedilol if remains above goal - Continue metoprolol  3. HLD: LDL 68 this admission. Simvastatin transitioned to atorvastatin - Continue atorvastatin 40mg  daily  4. DM type 2: A1C 7.3 . Home metformin held in anticipation of LHC - Continue ISS while inpatient - Anticipate resuming  metformin 48 hr after cath if Cr stable  5. CKD stage 3: Cr 1.74 today; perhaps up a bit from baseline. He follows with Dr. Carolin Sicks outpatient - Continue to monitor closely post-cath  6. LE edema: on lasix 20mg  daily at home, currently on hold in anticipation of LHC.  - Anticipate resuming lasix at discharge  For questions or updates, please contact [place] Please consult [email or web address] for contact info under Cardiology/STEMI.      Signed, Abigail Butts, PA-C  10/10/2019, 8:19 AM   [phone]  I have examined the patient and reviewed assessment and plan and discussed with patient.  Agree with above as stated.  Lying flat without shortness of breath.  Plan for cath today.  No LV gram given renal insufficiency.  Further plans  based on cath.   Lance Muss

## 2019-10-10 NOTE — Plan of Care (Signed)
°  Problem: Education: °Goal: Understanding of cardiac disease, CV risk reduction, and recovery process will improve °Outcome: Progressing °Goal: Individualized Educational Video(s) °Outcome: Progressing °  °

## 2019-10-10 NOTE — Interval H&P Note (Signed)
Cath Lab Visit (complete for each Cath Lab visit)  Clinical Evaluation Leading to the Procedure:   ACS: Yes.    Non-ACS:    Anginal Classification: CCS III  Anti-ischemic medical therapy: Minimal Therapy (1 class of medications)  Non-Invasive Test Results: No non-invasive testing performed  Prior CABG: No previous CABG      History and Physical Interval Note:  10/10/2019 1:58 PM  Richard Ho  has presented today for surgery, with the diagnosis of NSTEMI.  The various methods of treatment have been discussed with the patient and family. After consideration of risks, benefits and other options for treatment, the patient has consented to  Procedure(s): LEFT HEART CATH AND CORONARY ANGIOGRAPHY (N/A) as a surgical intervention.  The patient's history has been reviewed, patient examined, no change in status, stable for surgery.  I have reviewed the patient's chart and labs.  Questions were answered to the patient's satisfaction.     Nanetta Batty

## 2019-10-11 ENCOUNTER — Telehealth: Payer: Self-pay | Admitting: Cardiovascular Disease

## 2019-10-11 DIAGNOSIS — I214 Non-ST elevation (NSTEMI) myocardial infarction: Secondary | ICD-10-CM | POA: Diagnosis not present

## 2019-10-11 DIAGNOSIS — E1121 Type 2 diabetes mellitus with diabetic nephropathy: Secondary | ICD-10-CM

## 2019-10-11 DIAGNOSIS — I10 Essential (primary) hypertension: Secondary | ICD-10-CM

## 2019-10-11 DIAGNOSIS — N1832 Chronic kidney disease, stage 3b: Secondary | ICD-10-CM | POA: Diagnosis not present

## 2019-10-11 DIAGNOSIS — E785 Hyperlipidemia, unspecified: Secondary | ICD-10-CM

## 2019-10-11 LAB — CBC
HCT: 35.4 % — ABNORMAL LOW (ref 39.0–52.0)
Hemoglobin: 11.5 g/dL — ABNORMAL LOW (ref 13.0–17.0)
MCH: 33.4 pg (ref 26.0–34.0)
MCHC: 32.5 g/dL (ref 30.0–36.0)
MCV: 102.9 fL — ABNORMAL HIGH (ref 80.0–100.0)
Platelets: 204 10*3/uL (ref 150–400)
RBC: 3.44 MIL/uL — ABNORMAL LOW (ref 4.22–5.81)
RDW: 14 % (ref 11.5–15.5)
WBC: 6.3 10*3/uL (ref 4.0–10.5)
nRBC: 0 % (ref 0.0–0.2)

## 2019-10-11 LAB — BASIC METABOLIC PANEL
Anion gap: 9 (ref 5–15)
BUN: 25 mg/dL — ABNORMAL HIGH (ref 8–23)
CO2: 24 mmol/L (ref 22–32)
Calcium: 9.1 mg/dL (ref 8.9–10.3)
Chloride: 105 mmol/L (ref 98–111)
Creatinine, Ser: 1.71 mg/dL — ABNORMAL HIGH (ref 0.61–1.24)
GFR calc Af Amer: 43 mL/min — ABNORMAL LOW (ref >60)
GFR calc non Af Amer: 38 mL/min — ABNORMAL LOW (ref >60)
Glucose, Bld: 116 mg/dL — ABNORMAL HIGH (ref 70–99)
Potassium: 4.7 mmol/L (ref 3.5–5.1)
Sodium: 138 mmol/L (ref 135–145)

## 2019-10-11 LAB — GLUCOSE, CAPILLARY: Glucose-Capillary: 146 mg/dL — ABNORMAL HIGH (ref 70–99)

## 2019-10-11 MED ORDER — HYDROCORTISONE 1 % EX CREA
1.0000 "application " | TOPICAL_CREAM | Freq: Three times a day (TID) | CUTANEOUS | Status: DC | PRN
  Filled 2019-10-11: qty 28

## 2019-10-11 MED ORDER — NITROGLYCERIN 0.4 MG SL SUBL: 0.4000 mg | SUBLINGUAL_TABLET | SUBLINGUAL | 3 refills | Status: AC | PRN

## 2019-10-11 MED ORDER — CARVEDILOL 6.25 MG PO TABS
6.2500 mg | ORAL_TABLET | Freq: Two times a day (BID) | ORAL | Status: DC
  Administered 2019-10-11: 6.25 mg via ORAL
  Filled 2019-10-11: qty 1

## 2019-10-11 MED ORDER — ATORVASTATIN CALCIUM 40 MG PO TABS: 40.0000 mg | ORAL_TABLET | Freq: Every day | ORAL | 3 refills | Status: AC

## 2019-10-11 MED ORDER — CARVEDILOL 6.25 MG PO TABS: 6.2500 mg | ORAL_TABLET | Freq: Two times a day (BID) | ORAL | 6 refills | Status: DC

## 2019-10-11 NOTE — Progress Notes (Signed)
CARDIAC REHAB PHASE I   MI education completed with pt. Pt given MI book along with heart healthy and diabetic diets. Reviewed site care, restrictions and exercise guidelines. Pt states some "apprehension" in his breathing, states he thinks this is anxiety related to discharging. Pt expresses difficulty ambulating due to neuropathy but states he is getting a stationary bike to continue to exercise. Will refer to CRP II [place]. Pt requesting virtual cardiac rehab if there is a copay. Pt is interested in participating in Virtual Cardiac and Pulmonary Rehab. Pt advised that Virtual Cardiac and Pulmonary Rehab is provided at no cost to the patient.  Checklist:  1. Pt has smart device  ie smartphone and/or ipad for downloading an app  Yes 2. Reliable internet/wifi service    Yes 3. Understands how to use their smartphone and navigate within an app.  Yes  Pt verbalized understanding and is in agreement.  [id number] Reynold Bowen, RN BSN 10/11/2019 11:13 AM

## 2019-10-11 NOTE — Telephone Encounter (Signed)
New Message  Per Judy Pimple PA scheduled hospital follow up [facility] visit with Ronie Spies PA on 10/26/19 at 10:45am.

## 2019-10-11 NOTE — Care Management Important Message (Signed)
Important Message  Patient Details  Name: Richard Ho MRN: [id number] Date of Birth: 1941/04/25   Medicare Important Message Given:  Yes     Renie Ora 10/11/2019, 11:09 AM

## 2019-10-11 NOTE — Discharge Instructions (Signed)
PLEASE REMEMBER TO BRING ALL OF YOUR MEDICATIONS TO EACH OF YOUR FOLLOW-UP OFFICE VISITS.  PLEASE ATTEND ALL SCHEDULED FOLLOW-UP APPOINTMENTS.   Activity: Increase activity slowly as tolerated. You may shower, but no soaking baths (or swimming) for 1 week. No driving for 24 hours. No lifting over 5 lbs for 1 week. No sexual activity for 1 week.   You May Return to Work: in 1 week (if applicable)  Wound Care: You may wash cath site gently with soap and water. Keep cath site clean and dry. If you notice pain, swelling, bleeding or pus at your cath site, please call [phone].   MEDICATION CHANGES:  Please STOP taking your metoprolol. This medication has been replaced by carvedilol 6.25mg  BID.  Please STOP taking your simvastatin. This medication has been replaced by atorvastatin 40mg  daily.  You can restart your metformin 10/13/19 as previously prescribed.

## 2019-10-11 NOTE — Discharge Summary (Addendum)
Discharge Summary    Patient ID: Richard Ho MRN: [id number]; DOB: Mar 06, 1941  Admit date: 10/08/2019 Discharge date: 10/11/2019  Primary Care Provider: Philip AspenHernandez Ho, Richard Ho, Richard Ho  Primary Cardiologist: Richard Ho, Richard Ho  Primary Electrophysiologist:  None   Discharge Diagnoses    Active Problems:   NSTEMI (non-ST elevated myocardial infarction) [facility](HCC)    Diagnostic Studies/Procedures    Left heart catheterization 10/10/19:  Prox RCA to Mid RCA lesion is 100% stenosed.  1st Mrg lesion is 95% stenosed.  Lat 1st Mrg lesion is 95% stenosed.  Prox LAD to Mid LAD lesion is 50% stenosed.  Mid LAD lesion is 60% stenosed.    IMPRESSION: Richard Ho was admitted with a non-STEMI.  He has a long history of CAD dating back to [id number].  [place]  He is To get a 4296 in [place].  His last cath in 2013 by Dr. Clifton JamesMcAlhany revealed an occluded RCA in the midportion (CTO) with left-to-right collaterals, moderate proximal LAD disease and disease in origin of 2 marginal branches with normal LV function.  He was treated medically at that time.  He was admitted with chest pain and had positive troponins of 100.  His anatomy is for all intents and purposes unchanged.  I did perform DFR of the LAD which was 0.9 3 with FFR 0.91 suggesting that the LAD was not physiologically significant.  After reviewing the films I suspect continue medical therapy would be appropriate.  If he has accelerated angina his RCA CTO can certainly be intervened on as can his circumflex obtuse marginal branches.  The radial sheath was removed and a TR band was placed on the right wrist to achieve hemostasis.  The patient left lab in stable condition.  I have communicated the results to Dr. Eldridge DaceVaranasi, his rounding cardiologist.   Diagnostic Dominance: Right   _____________   History of Present Illness     Richard Ho is a 79 [name] with PMH of CAD s/p PCI to LAD in 1991, HTN, HLD, DM type  2, hypothyroidism, and CKD stage 3, who presented with exertional chest pain, found to have an NSTEMI.  Richard Ho has had coronary artery disease since the 1990s and had prior PCI [place]  Last cardiac cath was in October 2015, which demonstrated diffusely diseased RCA with no focal targets and stable moderate CAD of the LAD and circumflex systems.  He has been medically managed since then.  Last echo 1 month ago showed normal LV and RV function, grade 1 diastolic dysfunction, and mild mitral regurgitation.  He has been evaluated recently for progressive CKD and has been started on diuretics for leg edema.  His leg edema has much improved on oral diuretics.   For the past several weeks, he has had progressive dyspnea and chest pressure on exertion.  He states that he does not have symptoms every time he exerts himself, but sometimes has them simply with standing up. Last night, he developed  severe chest discomfort reminiscent of prior MI.  He presented to urgent care this morning where twelve-lead ECG demonstrated sinus rhythm with subtle ST depression in V5 and V6.  He was referred to the ED where high-sensitivity troponins were 100, 101.  Chest pain has been on and off since being in the ED.  He is currently chest pain-free.     Hospital Course     Consultants: None   1. NSTEMI in patient with  CAD s/p PCI to LAD in 1991: patient presented with chest pain. EKG was non-ischemic. HsTrop 101>100. He was given aspirin , then continued  daily. Heunderwent LHC 5/3/21which revealed CTO of the p-mRCA, 95% OM1 stenosis, 95% lat OM1 stenosis, and 50-60% LAD stenosis; overall unchanged from previous cath in 2013. Recommended for ongoing medical management with consideration of intervention to CTO RCA or OM branches if he has accelerated angina. His simvastatin  was transitioned to atorvastatin  daily. - Continue aspirin and statin - Continue metoprolol and imdur   2. HTN: BP  generally above goal; metoprolol transitioned to carvedilol for improved HTN management - Continue carvedilol 6.25mg  BID - Patient instructed to bring his BP cuff to his next appointment to compare calibration    3. HLD: LDL 68 this admission. Simvastatin transitioned to atorvastatin - Continue atorvastatin  daily   4. DM type 2: A1C 7.3 . Home metformin held in anticipation of LHC. Maintained on ISS while inpatient - Anticipate resuming metformin 48 hr after cath given stable Cr    5. CKD stage 3: Cr 1.71 following LHC - overall this is stable. He follows with Dr. Ronalee Belts outpatient - Continue to monitor closely outpatient   6. LE edema: on lasix  daily at home, currently on hold in anticipation of LHC.  - Anticipate resuming lasix at discharge    Did the patient have an acute coronary syndrome (MI, NSTEMI, STEMI, etc) this admission?:  Yes                               AHA/ACC Clinical Performance & Quality Measures: 1. Aspirin prescribed? - Yes 2. ADP Receptor Inhibitor (Plavix/Clopidogrel, Brilinta/Ticagrelor or Effient/Prasugrel) prescribed (includes medically managed patients)? - No - not indicated 3. Beta Blocker prescribed? - Yes 4. High Intensity Statin (Lipitor 40-80mg  or Crestor 20-40mg ) prescribed? - Yes 5. EF assessed during THIS hospitalization? - No - assessed prior to admission 6. For EF <40%, was ACEI/ARB prescribed? - Not Applicable (EF >/= 40%) 7. For EF <40%, Aldosterone Antagonist (Spironolactone or Eplerenone) prescribed? - Not Applicable (EF >/= 40%) 8. Cardiac Rehab Phase II ordered (Included Medically managed Patients)? - No - N/a   _____________  Discharge Vitals Blood pressure 134/66, pulse 79, temperature 97.7 F (36.5 C), temperature source Oral, resp. rate 18, height  (1.778 m), weight 96.9 kg, SpO2 97 %.  Filed Weights   10/09/19 0535 10/10/19 0626 10/11/19 0333  Weight: 98.9 kg 97.4 kg 96.9 kg    Labs & Radiologic Studies     CBC Recent Labs    10/10/19 0411 10/11/19 0412  WBC 7.1 6.3  HGB 11.2* 11.5*  HCT 34.5* 35.4*  MCV 100.9* 102.9*  PLT 215 204   Basic Metabolic Panel Recent Labs    16/10/96 0411 10/11/19 0412  NA 137 138  K 4.9 4.7  CL 105 105  CO2 25 24  GLUCOSE 112* 116*  BUN 22 25*  CREATININE 1.74* 1.71*  CALCIUM 9.5 9.1   Liver Function Tests No results for input(s): AST, ALT, ALKPHOS, BILITOT, PROT, ALBUMIN in the last 72 hours. No results for input(s): LIPASE, AMYLASE in the last 72 hours. High Sensitivity Troponin:   Recent Labs  Lab 10/08/19 1523 10/08/19 1746  TROPONINIHS 101* 100*    BNP Invalid input(s): POCBNP D-Dimer No results for input(s): DDIMER in the last 72 hours. Hemoglobin A1C Recent Labs    10/09/19 0512  HGBA1C 7.3*  Fasting Lipid Panel Recent Labs    10/09/19 0511  CHOL 143  HDL 38*  LDLCALC 68  TRIG 678*  CHOLHDL 3.8   Thyroid Function Tests No results for input(s): TSH, T4TOTAL, T3FREE, THYROIDAB in the last 72 hours.  Invalid input(s): FREET3 _____________  DG Chest 2 View  Result Date: 10/08/2019 CLINICAL DATA:  Shortness of breath, chest pain, diabetes mellitus, asthma EXAM: CHEST - 2 VIEW COMPARISON:  02/29/2020 FINDINGS: Borderline enlargement of cardiac silhouette. Mediastinal contours and pulmonary vascularity normal. Bibasilar atelectasis. Lungs otherwise clear. No infiltrate, pleural effusion or pneumothorax. Mild endplate spur formation thoracic spine. IMPRESSION: Bibasilar atelectasis. Electronically Signed   By: Ulyses Southward M.D.   On: 10/08/2019 16:12   CARDIAC CATHETERIZATION  Result Date: 10/10/2019  Prox RCA to Mid RCA lesion is 100% stenosed.  1st Mrg lesion is 95% stenosed.  Lat 1st Mrg lesion is 95% stenosed.  Prox LAD to Mid LAD lesion is 50% stenosed.  Mid LAD lesion is 60% stenosed.  Richard Ho is a 79 [name]  [id number] LOCATION:  FACILITY: [facility] PHYSICIAN: Richard Ho, M.D. 02/22/1941 DATE OF PROCEDURE:   10/10/2019 DATE OF DISCHARGE: CARDIAC CATHETERIZATION / DFR History obtained from chart review.79 [name] with PMH of CAD s/p PCI to LAD in 1991, HTN, HLD, DM type 2, hypothyroidism, and CKD stage 3, who presented with exertional chest pain, found to have an NSTEMI. PROCEDURE DESCRIPTION: The patient was brought to the second floor [facility] Cardiac cath lab in the postabsorptive state. He was premedicated with IV Versed and fentanyl. His right wristwas prepped and shaved in usual sterile fashion. Xylocaine 1% was used  for local anesthesia. A 6 French sheath was inserted into the right radial artery using standard Seldinger technique. The patient received 5000 units  of heparin intravenously.  A 5 [place] TIG catheter and pigtail catheters were used for selective coronary angiography and obtain left heart pressures.  Isovue dye was used for the entirety of the case.  Retrograde aortic left-ventricular and pullback pressures were recorded.  Radial cocktail was administered via the SideArm sheath.   Richard Ho was admitted with a non-STEMI.  He has a long history of CAD dating back to 61.  He apparently had a PCI in [place].  He is To get a 11 in [place].  His last cath in 2013 by Dr. Clifton Ho revealed an occluded RCA in the midportion (CTO) with left-to-right collaterals, moderate proximal LAD disease and disease in origin of 2 marginal branches with normal LV function.  He was treated medically at that time.  He was admitted with chest pain and had positive troponins of 100.  His anatomy is for all intents and purposes unchanged.  I did perform DFR of the LAD which was 0.9 3 with FFR 0.91 suggesting that the LAD was not physiologically significant.  After reviewing the films I suspect continue medical therapy would be appropriate.  If he has accelerated angina his RCA CTO can certainly be intervened on as can his circumflex obtuse marginal branches.  The radial sheath was removed and a TR band was placed on the  right wrist to achieve hemostasis.  The patient left lab in stable condition.  I have communicated the results to Dr. Eldridge Ho, his rounding cardiologist. Richard Ho. Richard Ho, [facility] 10/10/2019 3:01 PM   NM Parathyroid W/Spect  Result Date: 10/04/2019 CLINICAL DATA:  Secondary hyperparathyroidism EXAM: NM PARATHYROID SCINTIGRAPHY AND SPECT IMAGING TECHNIQUE: Following intravenous administration of radiopharmaceutical, early and 2-hour  delayed planar images were obtained in the anterior projection. Delayed triplanar SPECT images were also obtained at 2 hours. RADIOPHARMACEUTICALS:  26.7 mCi Tc-18m Sestamibi IV COMPARISON:  None FINDINGS: Planar imaging: Normal initial distribution of sestamibi in the thyroid lobes. Normal washout on delayed images. No definite abnormal sestamibi retention identified. SPECT imaging: Symmetric retention of tracer at the thyroid lobes. No focal areas of abnormal sestamibi localization identified to suggest parathyroid adenoma. No ectopic localization in the mediastinum. IMPRESSION: Negative parathyroid scan. Electronically Signed   By: Lavonia Dana M.D.   On: 10/04/2019 16:47   ECHOCARDIOGRAM COMPLETE  Result Date: 09/14/2019    ECHOCARDIOGRAM REPORT   Patient Name:   Richard Ho Date of Exam: 09/14/2019 Medical Rec #:  [id number]       Height:       71.0 in Accession #:    [id number]      Weight:       224.7 lb Date of Birth:  February 21, 1941       BSA:          2.216 m Patient Age:    36 years        BP:           110/70 mmHg Patient Gender: M               HR:           91 bpm. Exam Location:  [address] Procedure: 2D Echo, Cardiac Doppler and Color Doppler Indications:    I25.2  History:        Patient has prior history of Echocardiogram examinations, most                 recent 01/19/2017. Previous Myocardial Infarction and CAD; Risk                 Factors:Hypertension, Dyslipidemia and Diabetes.  Sonographer:    Coralyn Helling RDCS Referring Phys: Richard Ho  Sonographer  Comments: Image acquisition challenging due to respiratory motion. IMPRESSIONS  1. Left ventricular ejection fraction, by estimation, is 55 to 60%. The left ventricle has normal function. The left ventricle demonstrates regional wall motion abnormalities (see scoring diagram/findings for description). There is mild concentric left ventricular hypertrophy. Left ventricular diastolic parameters are consistent with Grade I diastolic dysfunction (impaired relaxation).  2. Right ventricular systolic function is normal. The right ventricular size is normal.  3. Left atrial size was mildly dilated.  4. The mitral valve is normal in structure. Mild mitral valve regurgitation. No evidence of mitral stenosis.  5. The aortic valve is normal in structure. Aortic valve regurgitation is not visualized. No aortic stenosis is present.  6. The inferior vena cava is normal in size with greater than 50% respiratory variability, suggesting right atrial pressure of 3 mmHg. FINDINGS  Left Ventricle: Left ventricular ejection fraction, by estimation, is 55 to 60%. The left ventricle has normal function. The left ventricle demonstrates regional wall motion abnormalities. The left ventricular internal cavity size was normal in size. There is mild concentric left ventricular hypertrophy. Left ventricular diastolic parameters are consistent with Grade I diastolic dysfunction (impaired relaxation). Normal left ventricular filling pressure.  LV Wall Scoring: The basal inferior segment and basal inferoseptal segment are akinetic. The basal inferolateral segment is hypokinetic. Right Ventricle: The right ventricular size is normal. No increase in right ventricular wall thickness. Right ventricular systolic function is normal. Left Atrium: Left atrial size was mildly dilated. Right Atrium: Right atrial size was  normal in size. Pericardium: There is no evidence of pericardial effusion. Mitral Valve: The mitral valve is normal in structure. Normal  mobility of the mitral valve leaflets. Mild mitral valve regurgitation. No evidence of mitral valve stenosis. Tricuspid Valve: The tricuspid valve is normal in structure. Tricuspid valve regurgitation is mild . No evidence of tricuspid stenosis. Aortic Valve: The aortic valve is normal in structure.. There is mild thickening and mild calcification of the aortic valve. Aortic valve regurgitation is not visualized. No aortic stenosis is present. There is mild thickening of the aortic valve. There is mild calcification of the aortic valve. Pulmonic Valve: The pulmonic valve was normal in structure. Pulmonic valve regurgitation is mild. No evidence of pulmonic stenosis. Aorta: The aortic root is normal in size and structure. Venous: The inferior vena cava is normal in size with greater than 50% respiratory variability, suggesting right atrial pressure of 3 mmHg. IAS/Shunts: No atrial level shunt detected by color flow Doppler.  LEFT VENTRICLE PLAX 2D LVIDd:         5.30 cm LVIDs:         3.80 cm LV PW:         1.00 cm LV IVS:        1.40 cm LVOT diam:     2.50 cm LV SV:         90 LV SV Index:   41 LVOT Area:     4.91 cm  IVC IVC diam: 1.70 cm LEFT ATRIUM             Index       RIGHT ATRIUM           Index LA diam:        4.60 cm 2.08 cm/m  RA Pressure: 3.00 mmHg LA Vol (A2C):   57.2 ml 25.82 ml/m RA Area:     14.60 cm LA Vol (A4C):   57.2 ml 25.82 ml/m RA Volume:   33.70 ml  15.21 ml/m LA Biplane Vol: 59.8 ml 26.99 ml/m  AORTIC VALVE LVOT Vmax:   95.82 cm/s LVOT Vmean:  59.625 cm/s LVOT VTI:    0.184 m  AORTA Ao Root diam: 3.90 cm Ao Asc diam:  3.40 cm MV E velocity: 89.08 cm/s   TRICUSPID VALVE MV A velocity: 120.60 cm/s  Estimated RAP:  3.00 mmHg MV E/A ratio:  0.74                             SHUNTS                             Systemic VTI:  0.18 m                             Systemic Diam: 2.50 cm Richard Alexander Richard Ho Electronically signed by Richard Alexander Richard Ho Signature Date/Time: 09/14/2019/6:56:22 PM     Final    VAS US CAROTID  Result Date: 09/16/2019 Carotid Arterial Duplex Study Indications:       Visual disturbance and Patient indicates he has had issues                    with balance and dizziness. He denies any other                    cerebrovascular symptoms. Risk Factors:  Hypertension, hyperlipidemia, Diabetes, no history of                    smoking, coronary artery disease. Comparison Study:  None Performing Technologist: Alecia Mackin RVT, RDCS (AE), RDMS  Examination Guidelines: A complete evaluation includes B-mode imaging, spectral Doppler, color Doppler, and power Doppler as needed of all accessible portions of each vessel. Bilateral testing is considered an integral part of a complete examination. Limited examinations for reoccurring indications may be performed as noted.  Right Carotid Findings: +----------+--------+--------+--------+----------------------+--------+           PSV cm/sEDV cm/sStenosisPlaque Description    Comments +----------+--------+--------+--------+----------------------+--------+ CCA Prox  104     14                                             +----------+--------+--------+--------+----------------------+--------+ CCA Distal74      7       <50%    irregular and calcific         +----------+--------+--------+--------+----------------------+--------+ ICA Prox  86      18              irregular and calcific         +----------+--------+--------+--------+----------------------+--------+ ICA Mid   102     21      1-39%                                  +----------+--------+--------+--------+----------------------+--------+ ICA Distal70      21                                             +----------+--------+--------+--------+----------------------+--------+ ECA       134     8               calcific and focal             +----------+--------+--------+--------+----------------------+--------+  +----------+--------+-------+----------------+-------------------+           PSV cm/sEDV cmsDescribe        Arm Pressure (mmHG) +----------+--------+-------+----------------+-------------------+ [id number]            Multiphasic, [id number]                 +----------+--------+-------+----------------+-------------------+ +---------+--------+--+--------+--+---------+ VertebralPSV cm/s64EDV cm/s16Antegrade +---------+--------+--+--------+--+---------+  Left Carotid Findings: +----------+--------+--------+--------+----------------------+--------+           PSV cm/sEDV cm/sStenosisPlaque Description    Comments +----------+--------+--------+--------+----------------------+--------+ CCA Prox  59      8                                     tortuous +----------+--------+--------+--------+----------------------+--------+ CCA Mid   108     14      <50%    irregular and calcific         +----------+--------+--------+--------+----------------------+--------+ CCA Distal66      11              irregular and calcific         +----------+--------+--------+--------+----------------------+--------+ ICA Prox  68      13              irregular and calcific         +----------+--------+--------+--------+----------------------+--------+  ICA Mid   70      18      1-39%                                  +----------+--------+--------+--------+----------------------+--------+ ICA Distal110     33                                    tortuous +----------+--------+--------+--------+----------------------+--------+ ECA       102     10              irregular and calcific         +----------+--------+--------+--------+----------------------+--------+ +----------+--------+--------+----------------+-------------------+           PSV cm/sEDV cm/sDescribe        Arm Pressure (mmHG) +----------+--------+--------+----------------+-------------------+ [id number]              Multiphasic, [id number]                 +----------+--------+--------+----------------+-------------------+ +---------+--------+--+--------+--+---------+ VertebralPSV cm/s47EDV cm/s13Antegrade +---------+--------+--+--------+--+---------+   Summary: Right Carotid: Velocities in the right ICA are consistent with a 1-39% stenosis.                Non-hemodynamically significant plaque <50% noted in the CCA. Left Carotid: Velocities in the left ICA are consistent with a 1-39% stenosis.               Non-hemodynamically significant plaque <50% noted in the CCA. Vertebrals:  Bilateral vertebral arteries demonstrate antegrade flow. Subclavians: Normal flow hemodynamics were seen in bilateral subclavian              arteries. *See table(s) above for measurements and observations.  Electronically signed by Richard Rich Richard Ho on 09/16/2019 at 12:26:58 PM.    Final    Disposition   Pt is being discharged home today in good condition.  Follow-up Plans & Appointments    Follow-up Information     Richard Montana, PA-C Follow up on 10/26/2019.   Specialties: Cardiology, Radiology Why: Please arrive 15 minutes early for your 10:45am post-hospital cardiology follow-up appointment.  Contact information: [address] [place] [address] [phone]              Discharge Medications   Allergies as of 10/11/2019       Reactions   Trazodone And Nefazodone    "makes me freak out"   Meloxicam Other (See Comments)   "jumping out of my skin" Pt feels anxiety attacks when on this medicine        Medication List     STOP taking these medications    metoprolol tartrate 50 MG tablet Commonly known as: LOPRESSOR   simvastatin 80 MG tablet Commonly known as: ZOCOR Replaced by: atorvastatin 40 MG tablet       TAKE these medications    aspirin EC 81 MG tablet Take 81 mg by mouth daily.   atorvastatin 40 MG tablet Commonly known as: LIPITOR Take 1 tablet (40 mg  total) by mouth daily. Replaces: simvastatin 80 MG tablet   Breo Ellipta 200-25 MCG/INH Aepb Generic drug: fluticasone furoate-vilanterol Inhale 1 puff into the lungs daily. Rinse, gargle, and spit after use.   carvedilol 6.25 MG tablet Commonly known as: COREG Take 1 tablet (6.25 mg total) by mouth 2 (two) times daily with a meal.   CENTRUM ADULTS PO  Take 1 tablet by mouth daily.   ferrous sulfate 325 (65 FE) MG EC tablet Take 325 mg by mouth daily.   fluticasone 110 MCG/ACT inhaler Commonly known as: Flovent HFA Inhale three puffs three times daily during flare-up.  Rinse, gargle, and spit after use. What changed:   how much to take  how to take this  when to take this  reasons to take this   fluticasone 50 MCG/ACT nasal spray Commonly known as: FLONASE USE 1 TO 2 SPRAY(S) IN EACH NOSTRIL ONCE DAILY AS DIRECTED What changed:   how much to take  how to take this  when to take this   furosemide 20 MG tablet Commonly known as: LASIX Take 20 mg by mouth daily.   isosorbide mononitrate 30 MG 24 hr tablet Commonly known as: IMDUR TAKE ONE TABLET BY MOUTH TWICE DAILY What changed: when to take this   levothyroxine 75 MCG tablet Commonly known as: SYNTHROID Take 1 tablet (75 mcg total) by mouth daily. What changed:   when to take this  Another medication with the same name was removed. Continue taking this medication, and follow the directions you see here.   metFORMIN 500 MG tablet Commonly known as: GLUCOPHAGE Take 1 tablet (500 mg total) by mouth 2 (two) times daily with a meal. Notes to patient: You can restart this medication 10/13/19 as previously prescribed.   montelukast 10 MG tablet Commonly known as: SINGULAIR Take one tablet by mouth once daily as directed.   nitroGLYCERIN 0.4 MG SL tablet Commonly known as: NITROSTAT Place 1 tablet (0.4 mg total) under the tongue every 5 (five) minutes x 3 doses as needed for chest pain. What changed:    when to take this  reasons to take this  additional instructions   pantoprazole 40 MG tablet Commonly known as: PROTONIX Take 1 tablet (40 mg total) by mouth daily.   pregabalin 75 MG capsule Commonly known as: LYRICA Take 1 capsule (75 mg total) by mouth 2 (two) times daily.   tamsulosin 0.4 MG Caps capsule Commonly known as: FLOMAX TAKE ONE CAPSULE BY MOUTH DAILY           Outstanding Labs/Studies   None  Duration of Discharge Encounter   Greater than 30 minutes including physician time.  Signed, Richard Stallion, PA-C 10/11/2019, 8:55 AM  I have examined the patient and reviewed assessment and plan and discussed with patient.  Agree with above as stated.  Angina controlled medically.   GEN: Well nourished, well developed, in no acute distress  HEENT: normal  Neck: no JVD, carotid bruits, or masses Cardiac: RRR; no murmurs, rubs, or gallops,no edema  Respiratory:  clear to auscultation bilaterally, normal work of breathing GI: soft, nontender, nondistended,  MS: no deformity or atrophy ; no right wrist hematoma Skin: warm and dry, no rash Neuro:  Strength and sensation are intact Psych: euthymic mood, full affect  If he has recurrent angina, could consider CTO PCI of the RCA.  There are brisk left to right collaterals.  This occlusion has been there for many years.  Given his renal dysfunction, would favor medical therapy if that does not adequately control his symptoms.  The patient will follow up with Dr. Clifton Ho.   Lance Muss

## 2019-10-12 ENCOUNTER — Other Ambulatory Visit: Payer: Self-pay

## 2019-10-12 NOTE — Telephone Encounter (Signed)
Patient contacted regarding discharge from [facility] on 10/11/2019.  Patient understands to follow up with provider Ronie Spies, PA-c on 10/26/2019 at 10:45 at [address] in [place], [place] [address]. Patient understands discharge instructions? Yes Patient understands medications and regiment? Yes Patient understands to bring all medications to this visit? Yes  Ask patient:  Are you enrolled in My Chart: Yes

## 2019-10-13 ENCOUNTER — Encounter: Payer: Self-pay | Admitting: Internal Medicine

## 2019-10-13 ENCOUNTER — Telehealth (HOSPITAL_COMMUNITY): Payer: Self-pay

## 2019-10-13 ENCOUNTER — Ambulatory Visit (INDEPENDENT_AMBULATORY_CARE_PROVIDER_SITE_OTHER): Payer: Medicare Other | Admitting: Internal Medicine

## 2019-10-13 VITALS — BP 102/64 | HR 55 | Temp 97.4°F | Wt 215.6 lb

## 2019-10-13 DIAGNOSIS — I214 Non-ST elevation (NSTEMI) myocardial infarction: Secondary | ICD-10-CM | POA: Diagnosis not present

## 2019-10-13 DIAGNOSIS — G4700 Insomnia, unspecified: Secondary | ICD-10-CM

## 2019-10-13 DIAGNOSIS — E785 Hyperlipidemia, unspecified: Secondary | ICD-10-CM

## 2019-10-13 DIAGNOSIS — I10 Essential (primary) hypertension: Secondary | ICD-10-CM

## 2019-10-13 DIAGNOSIS — E039 Hypothyroidism, unspecified: Secondary | ICD-10-CM

## 2019-10-13 DIAGNOSIS — Z09 Encounter for follow-up examination after completed treatment for conditions other than malignant neoplasm: Secondary | ICD-10-CM | POA: Diagnosis not present

## 2019-10-13 DIAGNOSIS — E1129 Type 2 diabetes mellitus with other diabetic kidney complication: Secondary | ICD-10-CM | POA: Diagnosis not present

## 2019-10-13 MED ORDER — ALPRAZOLAM 0.25 MG PO TABS: 0.2500 mg | ORAL_TABLET | Freq: Every evening | ORAL | 0 refills | Status: DC | PRN

## 2019-10-13 NOTE — Patient Instructions (Signed)
-  Nice seeing you today!!  -See you back in 3 months. 

## 2019-10-13 NOTE — Progress Notes (Signed)
Established Patient Office Visit     This visit occurred during the SARS-CoV-2 public health emergency.  Safety protocols were in place, including screening questions prior to the visit, additional usage of staff PPE, and extensive cleaning of exam room while observing appropriate contact time as indicated for disinfecting solutions.    CC/Reason for Visit: Hospital follow-up  HPI: Richard Ho is a 79 y.o. male who is coming in today for the above mentioned reasons. Past Medical History is significant for:   1. Type 2 diabetes, his last A1c was 7.2 in February, he states his CBGs at home range between 107 and 122, he had one low last week at 70 but that is because he was doing yard work and did not eat.  2. Hypertension this has been well controlled at home.  3. Diabetic neuropathy, pain is improved since starting Lyrica last visit, however he has had increasing numbness and issues with balance.  4. Hypothyroidism/TSH was 4.390 in February, he continues on Synthroid.  5. Asthma this appears to be moderately well controlled on ipratropium and as needed albuterol, he tells me he has been seeing an allergist.  6. Left retinal detachment in 2019, followed by ophthalmology, still has some visual floaters but he is overall happy with the results of his surgery.  7. Coronary artery disease.  8. Hyperlipidemia, last LDL was 56 in February.  He was hospitalized from May 1 through May 4 for non-ST elevated MI.  He had a cardiac catheterization and it was decided to pursue medical management.  His statin was changed from simvastatin to atorvastatin, his metoprolol was changed to carvedilol and he was placed on Imdur.  He has been doing well since discharge.  He continues to complain of neuropathy in his feet for which she is now on Lyrica.  He has been having some issues with sleeping at night.  They gave him Xanax in the hospital which seemed to help.  He is wondering if  we can prescribe some Xanax short-term for him.  He has follow-up with cardiology scheduled for May 19.  He has had both of his Covid vaccinations.     Past Medical/Surgical History: Past Medical History:  Diagnosis Date  . Asthma   . Coronary artery disease 1991   a) Angioplasty LAD 1991 [place] b) MI in 1995 at  [facility] in [place], med Rx  . Diabetes mellitus   . GERD (gastroesophageal reflux disease)   . Hearing loss   . HTN (hypertension)   . Hyperlipidemia   . Hypothyroidism   . Myocardial infarct ([place])   . Nephrolithiasis   . Osteopenia   . Snake bite     Past Surgical History:  Procedure Laterality Date  . ANGIOPLASTY  1991  . CARDIAC CATHETERIZATION  2005   Moderate LCx and LAD disease and severe diffuse RCA disease  . CARDIAC CATHETERIZATION  01/2012   normal left main, 50% pLAD stenosis, dLAD mild luminal irregularities, D1 30-40% stenosis at take off; subtotally occluded/severely diseased OM1, mid 80% stenosis in small OM2, long prox and mid RCA stenoses ranging from 60-95%, severely diseased PDA, distal PLSA occluded filling via L-R collateral; normal LV function; inferior lateral basal akinesis.  . CHOLECYSTECTOMY  2012  . EYE SURGERY  2010   cataract - right, has left done too  . INGUINAL HERNIA REPAIR    . INTRAVASCULAR PRESSURE WIRE/FFR STUDY N/A 10/10/2019   Procedure: INTRAVASCULAR PRESSURE WIRE/FFR STUDY;  Surgeon:  Runell Gess, MD;  Location: [facility] INVASIVE CV [facility];  Service: Cardiovascular;  Laterality: N/A;  . LEFT HEART CATH AND CORONARY ANGIOGRAPHY N/A 10/10/2019   Procedure: LEFT HEART CATH AND CORONARY ANGIOGRAPHY;  Surgeon: Runell Gess, MD;  Location: [facility] INVASIVE CV LAB;  Service: Cardiovascular;  Laterality: N/A;  . LEFT HEART CATHETERIZATION WITH CORONARY ANGIOGRAM N/A 01/28/2012   Procedure: LEFT HEART CATHETERIZATION WITH CORONARY ANGIOGRAM;  Surgeon: Iran Ouch, MD;  Location: [facility] CATH LAB;  Service: Cardiovascular;  Laterality:  N/A;  . LEFT HEART CATHETERIZATION WITH CORONARY ANGIOGRAM N/A 03/17/2014   Procedure: LEFT HEART CATHETERIZATION WITH CORONARY ANGIOGRAM;  Surgeon: Kathleene Hazel, MD;  Location: [facility] CATH LAB;  Service: Cardiovascular;  Laterality: N/A;  . ORIF TIBIA & FIBULA FRACTURES    . PTCA    . SHOULDER ARTHROSCOPY WITH DISTAL CLAVICLE RESECTION Left 12/16/2018   Procedure: LEFT SHOULDER ARTHROSCOPY WITH DISTAL CLAVICLE RESECTION;  Surgeon: Bjorn Pippin, MD;  Location: [facility];  Service: Orthopedics;  Laterality: Left;  . SHOULDER ARTHROSCOPY WITH ROTATOR CUFF REPAIR AND SUBACROMIAL DECOMPRESSION Left 12/16/2018   Procedure: LEFT SHOULDER ARTHROSCOPY WITH ROTATOR CUFF REPAIR AND SUBACROMIAL DECOMPRESSION, DEBRIDEMENT;  Surgeon: Bjorn Pippin, MD;  Location: [facility];  Service: Orthopedics;  Laterality: Left;  . SHOULDER ARTHROSCOPY WITH SUBACROMIAL DECOMPRESSION AND BICEP TENDON REPAIR Left 12/16/2018   Procedure: SHOULDER ARTHROSCOPY WITH SUBACROMIAL DECOMPRESSION AND BICEP TENDON REPAIR;  Surgeon: Bjorn Pippin, MD;  Location:  SURGERY CENTER;  Service: Orthopedics;  Laterality: Left;  Marland Kitchen VASECTOMY      Social History:  reports that he has never smoked. He has never used smokeless tobacco. He reports current alcohol use. He reports that he does not use drugs.  Allergies: Allergies  Allergen Reactions  . Trazodone And Nefazodone     "makes me freak out"  . Meloxicam Other (See Comments)    "jumping out of my skin" Pt feels anxiety attacks when on this medicine    Family History:  Family History  Problem Relation Age of Onset  . Asthma Mother   . Allergies Sister   . Coronary artery disease Other      Current Outpatient Medications:  .  aspirin EC 81 MG tablet, Take 81 mg by mouth daily., Disp: , Rfl:  .  atorvastatin (LIPITOR) 40 MG tablet, Take 1 tablet (40 mg total) by mouth daily., Disp: 90 tablet, Rfl: 3 .  carvedilol (COREG) 6.25 MG  tablet, Take 1 tablet (6.25 mg total) by mouth 2 (two) times daily with a meal., Disp: 60 tablet, Rfl: 6 .  ferrous sulfate 325 (65 FE) MG EC tablet, Take 325 mg by mouth daily. , Disp: , Rfl:  .  fluticasone (FLONASE) 50 MCG/ACT nasal spray, USE 1 TO 2 SPRAY(S) IN EACH NOSTRIL ONCE DAILY AS DIRECTED (Patient taking differently: Place 1-2 sprays into both nostrils daily. USE 1 TO 2 SPRAY(S) IN EACH NOSTRIL ONCE DAILY AS DIRECTED), Disp: 48 g, Rfl: 0 .  fluticasone (FLOVENT HFA) 110 MCG/ACT inhaler, Inhale three puffs three times daily during flare-up.  Rinse, gargle, and spit after use. (Patient taking differently: Inhale 3 puffs into the lungs daily as needed (sob, wheezing). Inhale three puffs three times daily during flare-up.  Rinse, gargle, and spit after use.), Disp: 3 Inhaler, Rfl: 1 .  fluticasone furoate-vilanterol (BREO ELLIPTA) 200-25 MCG/INH AEPB, Inhale 1 puff into the lungs daily. Rinse, gargle, and spit after use., Disp: 180 each, Rfl: 1 .  furosemide (LASIX) 20 MG tablet, Take 20 mg by mouth daily. , Disp: , Rfl:  .  isosorbide mononitrate (IMDUR) 30 MG 24 hr tablet, TAKE ONE TABLET BY MOUTH TWICE DAILY (Patient taking differently: Take 30 mg by mouth in the morning and at bedtime. ), Disp: 180 tablet, Rfl: 3 .  levothyroxine (SYNTHROID) 75 MCG tablet, Take 1 tablet (75 mcg total) by mouth daily. (Patient taking differently: Take 75 mcg by mouth daily before breakfast. ), Disp: 90 tablet, Rfl: 1 .  metFORMIN (GLUCOPHAGE) 500 MG tablet, Take 1 tablet (500 mg total) by mouth 2 (two) times daily with a meal., Disp: 180 tablet, Rfl: 1 .  montelukast (SINGULAIR) 10 MG tablet, Take one tablet by mouth once daily as directed., Disp: 90 tablet, Rfl: 1 .  Multiple Vitamins-Minerals (CENTRUM ADULTS PO), Take 1 tablet by mouth daily. , Disp: , Rfl:  .  nitroGLYCERIN (NITROSTAT) 0.4 MG SL tablet, Place 1 tablet (0.4 mg total) under the tongue every 5 (five) minutes x 3 doses as needed for chest pain.,  Disp: 25 tablet, Rfl: 3 .  pantoprazole (PROTONIX) 40 MG tablet, Take 1 tablet (40 mg total) by mouth daily., Disp: 90 tablet, Rfl: 1 .  pregabalin (LYRICA) 75 MG capsule, Take 1 capsule (75 mg total) by mouth 2 (two) times daily., Disp: 180 capsule, Rfl: 1 .  tamsulosin (FLOMAX) 0.4 MG CAPS capsule, TAKE ONE CAPSULE BY MOUTH DAILY (Patient taking differently: Take 0.4 mg by mouth daily. ), Disp: 30 capsule, Rfl: 5  Review of Systems:  Constitutional: Denies fever, chills, diaphoresis, appetite change and fatigue.  HEENT: Denies photophobia, eye pain, redness, hearing loss, ear pain, congestion, sore throat, rhinorrhea, sneezing, mouth sores, trouble swallowing, neck pain, neck stiffness and tinnitus.   Respiratory: Denies SOB, DOE, cough, chest tightness,  and wheezing.   Cardiovascular: Denies chest pain, palpitations and leg swelling.  Gastrointestinal: Denies nausea, vomiting, abdominal pain, diarrhea, constipation, blood in stool and abdominal distention.  Genitourinary: Denies dysuria, urgency, frequency, hematuria, flank pain and difficulty urinating.  Endocrine: Denies: hot or cold intolerance, sweats, changes in hair or nails, polyuria, polydipsia. Musculoskeletal: Denies myalgias, back pain, joint swelling, arthralgias and gait problem.  Skin: Denies pallor, rash and wound.  Neurological: Denies dizziness, seizures, syncope, weakness, light-headedness, numbness and headaches.  Hematological: Denies adenopathy. Easy bruising, personal or family bleeding history  Psychiatric/Behavioral: Denies suicidal ideation, mood changes, confusion, nervousness, sleep disturbance and agitation    Physical Exam: Vitals:   10/13/19 0723  BP: 102/64  Pulse: (!) 55  Temp: (!) 97.4 F (36.3 C)  TempSrc: Temporal  SpO2: 97%  Weight: 215 lb 9.6 oz (97.8 kg)    Body mass index is 30.94 kg/m.   Constitutional: NAD, calm, comfortable Eyes: PERRL, lids and conjunctivae normal, wears corrective  lenses ENMT: Mucous membranes are moist. Respiratory: clear to auscultation bilaterally, no wheezing, no crackles. Normal respiratory effort. No accessory muscle use.  Cardiovascular: Regular rate and rhythm, no murmurs / rubs / gallops. No extremity edema.  Neurologic: Grossly intact and nonfocal  Psychiatric: Normal judgment and insight. Alert and oriented x 3. Normal mood.    Impression and Plan:  Hospital discharge follow-up NSTEMI (non-ST elevated myocardial infarction) (HCC) Type 2 diabetes mellitus with other kidney complication, unspecified whether long term insulin use (HCC) Morbid obesity (HCC) Acquired hypothyroidism Dyslipidemia Essential hypertension  -Medication list has been updated in epic. -Blood pressure is much better controlled on carvedilol as opposed to metoprolol. -He has been undergoing cardiac  rehab. -He is considering purchasing an exercise bike for home. -He will follow up in 3 months for management of his diabetes. -He has follow-up scheduled with cardiology in the next 10 days.    Patient Instructions  -Nice seeing you today!!  -See you back in 3 months.     Chaya Jan, MD [facility]

## 2019-10-13 NOTE — Telephone Encounter (Signed)
Pt insurance is active and benefits verified through [facility]. Co-pay $20.00, DED $0.00/$0.00 met, out of pocket $3,900.00/$352.79 met, co-insurance 0%. No pre-authorization required. Gher A./[employer] Medicare, 10/13/19 @ 332PM, REF#[id number]  Will contact patient to see if he is interested in the Cardiac Rehab Program. If interested, patient will need to complete follow up appt. Once completed, patient will be contacted for scheduling upon review by the RN Navigator.

## 2019-10-13 NOTE — Telephone Encounter (Signed)
Attempted to call patient in regards to Cardiac Rehab - LM on VM 

## 2019-10-14 DIAGNOSIS — N1831 Chronic kidney disease, stage 3a: Secondary | ICD-10-CM | POA: Diagnosis not present

## 2019-10-14 DIAGNOSIS — N2581 Secondary hyperparathyroidism of renal origin: Secondary | ICD-10-CM | POA: Diagnosis not present

## 2019-10-25 ENCOUNTER — Encounter: Payer: Self-pay | Admitting: Physician Assistant

## 2019-10-25 NOTE — Progress Notes (Addendum)
Cardiology Office Note    Date:  10/26/2019   ID:  Richard Ho, DOB 02/19/1941, MRN [id number]  PCP:  Richard Ho, Richard Patricia, MD  Cardiologist:  Verne Carrow, MD  Electrophysiologist:  None   Chief Complaint: f/u NSTEMI  History of Present Illness:   Richard Ho is a 79 y.o. male with history of CAD s/p PCI to LAD in 1991, recent NSTEMI with cath as outlined below, HTN, HLD, PACs/PVCs by prior EKGs, mild carotid artery disease, DM type 2, hypothyroidism, and CKD stage 3 who presents for pots-hospital follow-up. Mr.Rosshas had coronary artery disease since the 1990s and had prior PCI in [place] and in [place]. Prior cath in 2015 showed findings that were best treated medically. He has been evaluated recently for progressive CKD and has been started on diuretics for leg edema with improvement. Dr. Johney Ho fielded a DOD call this spring from ophthalmology for atherosclerosis noted and it was recommended he undergo an echo and carotid duplex. Recent echo 09/2019 showed EF 55-60%, mild LVH, grade 1 DD, mild LAE, mild MR and carotid duplex showed 1-39% stenosis bilaterally.   He presented recently to the hospital with chest pressure and elevated troponin peak of 101.  He underwent LHC 10/10/19 which revealed CTO of the p-mRCA, 95% OM1 stenosis, 95% lat OM1 stenosis, and 50-60% LAD stenosis; overall unchanged from previous cath in 2013. It was recommended for ongoing medical management with consideration of intervention to CTO RCA or OM branches if he has accelerated angina. Simvastatin was changed to atorvastatin. Metoprolol was changed to carvedilol. Last labs personally reviewed from admission showed K 4.7, Cr 1.71, Hgb 11.5 (macrocytic), LDL 68, trig 187, A1C 7.3, 08/2019 LFTs wnl, previous Cr in the 1.3-1.7 range, 05/2019 TSH elevated.   He returns for follow-up overall doing well. He has not had any recurrent angina. No dyspnea or edema reported. He is tolerating medicine  changes well. He does report that for the last month (even prior to recent admission) he will notice brief episodes of lightheadedness. They come on about 1-2x a day, last only 10 seconds then resolve without intervention. They do not necessarily happen only when standing; can happen at rest too. He sometimes has trouble with his BP cuff registering properly due to his history of ectopy, so unsure what BP is during these episodes. The lightheadedness is mild per patient report. No syncope. He cites his 2 major issues as peripheral neuropathy and proprioception issues - he is working with the [place] on the latter with therapy exercises. 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  Past Medical History:  Diagnosis Date  . Anemia   . Asthma   . CKD (chronic kidney disease), stage III   . Coronary artery disease 1991   a) Angioplasty LAD 1991 [place] b) MI in 1995 at  [facility] in [place], med Rx c) Cath 10/2019 for NSTEMI - see report -> med rx.  . Diabetes mellitus   . GERD (gastroesophageal reflux disease)   . Hearing loss   . HTN (hypertension)   . Hyperlipidemia   . Hypothyroidism   . Myocardial infarct (HCC)   . Nephrolithiasis   . Osteopenia   . Snake bite     Past Surgical History:  Procedure Laterality Date  . ANGIOPLASTY  1991  . CARDIAC CATHETERIZATION  2005   Moderate LCx and LAD disease and severe diffuse RCA disease  . CARDIAC CATHETERIZATION  01/2012   normal left main, 50% pLAD stenosis, dLAD mild luminal irregularities, D1 30-40% stenosis at take off; subtotally occluded/severely diseased OM1, mid 80% stenosis in small OM2, long prox and mid RCA stenoses ranging from 60-95%, severely diseased PDA, distal PLSA occluded filling via L-R collateral; normal LV function; inferior lateral basal akinesis.  . CHOLECYSTECTOMY  2012  . EYE SURGERY  2010   cataract - right, has left done too  . INGUINAL HERNIA REPAIR    . INTRAVASCULAR  PRESSURE WIRE/FFR STUDY N/A 10/10/2019   Procedure: INTRAVASCULAR PRESSURE WIRE/FFR STUDY;  Surgeon: Runell Gess, MD;  [facility]: [facility];  Service: Cardiovascular;  Laterality: N/A;  . LEFT HEART CATH AND CORONARY ANGIOGRAPHY N/A 10/10/2019   Procedure: LEFT HEART CATH AND CORONARY ANGIOGRAPHY;  Surgeon: Runell Gess, MD;  Location: [facility];  Service: Cardiovascular;  Laterality: N/A;  . LEFT HEART CATHETERIZATION WITH CORONARY ANGIOGRAM N/A 01/28/2012   Procedure: LEFT HEART CATHETERIZATION WITH CORONARY ANGIOGRAM;  Surgeon: Iran Ouch, MD;  Location: [facility] CATH LAB;  Service: Cardiovascular;  Laterality: N/A;  . LEFT HEART CATHETERIZATION WITH CORONARY ANGIOGRAM N/A 03/17/2014   Procedure: LEFT HEART CATHETERIZATION WITH CORONARY ANGIOGRAM;  Surgeon: Kathleene Hazel, MD;  Location: [facility] CATH LAB;  Service: Cardiovascular;  Laterality: N/A;  . ORIF TIBIA & FIBULA FRACTURES    . PTCA    . SHOULDER ARTHROSCOPY WITH DISTAL CLAVICLE RESECTION Left 12/16/2018   Procedure: LEFT SHOULDER ARTHROSCOPY WITH DISTAL CLAVICLE RESECTION;  Surgeon: Bjorn Pippin, MD;  Location: [facility];  Service: Orthopedics;  Laterality: Left;  . SHOULDER ARTHROSCOPY WITH ROTATOR CUFF REPAIR AND SUBACROMIAL DECOMPRESSION Left 12/16/2018   Procedure: LEFT SHOULDER ARTHROSCOPY WITH ROTATOR CUFF REPAIR AND SUBACROMIAL DECOMPRESSION, DEBRIDEMENT;  Surgeon: Bjorn Pippin, MD;  Location: [facility];  Service: Orthopedics;  Laterality: Left;  . SHOULDER ARTHROSCOPY WITH SUBACROMIAL DECOMPRESSION AND BICEP TENDON REPAIR Left 12/16/2018   Procedure: SHOULDER ARTHROSCOPY WITH SUBACROMIAL DECOMPRESSION AND BICEP TENDON REPAIR;  Surgeon: Bjorn Pippin, MD;  Location: [facility];  Service: Orthopedics;  Laterality: Left;  Richard Ho VASECTOMY      Current Medications: Current Meds  Medication Sig  . ALPRAZolam (XANAX) 0.25 MG tablet Take 1 tablet (0.25 mg total) by  mouth at bedtime as needed for sleep.  Richard Ho aspirin EC 81 MG tablet Take 81 mg by mouth daily.  Richard Ho atorvastatin (LIPITOR) 40 MG tablet Take 1 tablet (40 mg total) by mouth daily.  . carvedilol (COREG) 6.25 MG tablet Take 1 tablet (6.25 mg total) by mouth 2 (two) times daily with a meal.  . ferrous sulfate 325 (65 FE) MG EC tablet Take 325 mg by mouth daily.   . fluticasone (FLONASE) 50 MCG/ACT nasal spray USE 1 TO 2 SPRAY(S) IN EACH NOSTRIL ONCE DAILY AS DIRECTED  . fluticasone (FLOVENT HFA) 110 MCG/ACT inhaler Inhale three puffs three times daily during flare-up.  Rinse, gargle, and spit after use.  . fluticasone furoate-vilanterol (BREO ELLIPTA) 200-25 MCG/INH AEPB Inhale 1 puff into the lungs daily. Rinse, gargle, and spit after use.  . furosemide (LASIX) 20 MG tablet Take 20 mg by mouth daily.   . isosorbide mononitrate (IMDUR) 30 MG 24 hr tablet TAKE ONE TABLET BY MOUTH TWICE DAILY  . levothyroxine (SYNTHROID) 75 MCG tablet Take 1 tablet (75 mcg total) by mouth daily.  . metFORMIN (GLUCOPHAGE) 500 MG tablet Take 1 tablet (500 mg total) by mouth 2 (two) times  daily with a meal.  . Multiple Vitamins-Minerals (CENTRUM ADULTS PO) Take 1 tablet by mouth daily.   . nitroGLYCERIN (NITROSTAT) 0.4 MG SL tablet Place 1 tablet (0.4 mg total) under the tongue every 5 (five) minutes x 3 doses as needed for chest pain.  . pantoprazole (PROTONIX) 40 MG tablet Take 1 tablet (40 mg total) by mouth daily.  . pregabalin (LYRICA) 75 MG capsule Take 1 capsule (75 mg total) by mouth 2 (two) times daily.  . tamsulosin (FLOMAX) 0.4 MG CAPS capsule TAKE ONE CAPSULE BY MOUTH DAILY    Allergies:   Trazodone and nefazodone and Meloxicam   Social History   Socioeconomic History  . Marital status: Married    Spouse name: Not on file  . Number of children: Not on file  . Years of education: Not on file  . Highest education level: Not on file  Occupational History  . Occupation: Retired  Tobacco Use  . Smoking  status: Never Smoker  . Smokeless tobacco: Never Used  Substance and Sexual Activity  . Alcohol use: Yes    Alcohol/week: 0.0 standard drinks    Comment: occ, once every 2 months  . Drug use: No  . Sexual activity: Not on file  Other Topics Concern  . Not on file  Social History Narrative  . Not on file   Social Determinants of Health   Financial Resource Strain:   . Difficulty of Paying Living Expenses:   Food Insecurity:   . Worried About Charity fundraiser in the Last Year:   . Arboriculturist in the Last Year:   Transportation Needs:   . Film/video editor (Medical):   Richard Ho Lack of Transportation (Non-Medical):   Physical Activity:   . Days of Exercise per Week:   . Minutes of Exercise per Session:   Stress:   . Feeling of Stress :   Social Connections:   . Frequency of Communication with Friends and Family:   . Frequency of Social Gatherings with Friends and Family:   . Attends Religious Services:   . Active Member of Clubs or Organizations:   . Attends Archivist Meetings:   Richard Ho Marital Status:      Family History:  The patient's family history includes Allergies in his sister; Asthma in his mother; Coronary artery disease in an other family member.  ROS:   Please see the history of present illness. +insomnia.  All other systems are reviewed and otherwise negative.    EKGs/Labs/Other Studies Reviewed:    Studies reviewed are outlined and summarized above. Reports included below if pertinent.  2D Echo 09/2019 Sonographer Comments: Image acquisition challenging due to respiratory  motion.  IMPRESSIONS    1. Left ventricular ejection fraction, by estimation, is 55 to 60%. The  left ventricle has normal function. The left ventricle demonstrates  regional wall motion abnormalities (see scoring diagram/findings for  description). There is mild concentric left  ventricular hypertrophy. Left ventricular diastolic parameters are  consistent with  Grade I diastolic dysfunction (impaired relaxation).  2. Right ventricular systolic function is normal. The right ventricular  size is normal.  3. Left atrial size was mildly dilated.  4. The mitral valve is normal in structure. Mild mitral valve  regurgitation. No evidence of mitral stenosis.  5. The aortic valve is normal in structure. Aortic valve regurgitation is  not visualized. No aortic stenosis is present.  6. The inferior vena cava is normal in size with greater  than 50%  respiratory variability, suggesting right atrial pressure of 3 mmHg.   Carotid duplex 09/2019 Summary:  Right Carotid: Velocities in the right ICA are consistent with a 1-39%  stenosis.         Non-hemodynamically significant plaque <50% noted in the  CCA.   Left Carotid: Velocities in the left ICA are consistent with a 1-39%  stenosis.        Non-hemodynamically significant plaque <50% noted in the  CCA.   Vertebrals: Bilateral vertebral arteries demonstrate antegrade flow.  Subclavians: Normal flow hemodynamics were seen in bilateral subclavian        arteries.   LHC 10/10/19    Prox RCA to Mid RCA lesion is 100% stenosed.  1st Mrg lesion is 95% stenosed.  Lat 1st Mrg lesion is 95% stenosed.  Prox LAD to Mid LAD lesion is 50% stenosed.  Mid LAD lesion is 60% stenosed.   IMPRESSION: Mr. Groeneveld was admitted with a non-STEMI.  He has a long history of CAD dating back to 29.  He apparently had a PCI in [place].  He is To get a 39 in [place].  His last cath in 2013 by Dr. Clifton James revealed an occluded RCA in the midportion (CTO) with left-to-right collaterals, moderate proximal LAD disease and disease in origin of 2 marginal branches with normal LV function.  He was treated medically at that time.  He was admitted with chest pain and had positive troponins of 100.  His anatomy is for all intents and purposes unchanged.  I did perform DFR of the LAD which was 0.9 3 with FFR  0.91 suggesting that the LAD was not physiologically significant.  After reviewing the films I suspect continue medical therapy would be appropriate.  If he has accelerated angina his RCA CTO can certainly be intervened on as can his circumflex obtuse marginal branches.  The radial sheath was removed and a TR band was placed on the right wrist to achieve hemostasis.  The patient left lab in stable condition.  I have communicated the results to Dr. Eldridge Dace, his rounding cardiologist.  Nanetta Batty. MD, [facility] 10/10/2019 3:01 PM     EKG:  EKG is not ordered today but personally reviewed from 10/11/19 showing NSR 75bpm occasional PAC/PVC, prior anteroseptal infarct  Recent Labs: 05/18/2019: TSH 6.09 08/18/2019: ALT 14 10/09/2019: B Natriuretic Peptide 355.1 10/11/2019: BUN 25; Creatinine, Ser 1.71; Hemoglobin 11.5; Platelets 204; Potassium 4.7; Sodium 138  Recent Lipid Panel    Component Value Date/Time   CHOL 143 10/09/2019 0511   TRIG 187 (H) 10/09/2019 0511   HDL 38 (L) 10/09/2019 0511   CHOLHDL 3.8 10/09/2019 0511   VLDL 37 10/09/2019 0511   LDLCALC 68 10/09/2019 0511    PHYSICAL EXAM:    VS:  BP 110/76   Pulse 81   Ht 5\' 10"  (1.778 m)   Wt 214 lb 12.8 oz (97.4 kg)   SpO2 97%   BMI 30.82 kg/m   BMI: Body mass index is 30.82 kg/m.  GEN: Well nourished, well developed WM, in no acute distress HEENT: normocephalic, atraumatic Neck: no JVD, carotid bruits, or masses Cardiac: RRR, rare ectopy; no murmurs, rubs, or gallops, no edema  Respiratory:  clear to auscultation bilaterally, normal work of breathing GI: soft, nontender, nondistended, + BS MS: no deformity or atrophy Skin: warm and dry, no rash, right radial cath site without hematoma or ecchymosis; good pulse. Neuro:  Alert and Oriented x 3, Strength and sensation are intact,  follows commands Psych: euthymic mood, full affect  Wt Readings from Last 3 Encounters:  10/26/19 214 lb 12.8 oz (97.4 kg)  10/13/19 215 lb 9.6 oz  (97.8 kg)  10/11/19 213 lb 11.2 oz (96.9 kg)     ASSESSMENT & PLAN:   1. CAD - doing well with medicine changes. Continue present regimen. Hospital team did not feel additional antiplatelet therapy aside from aspirin was warranted. Patient also has history of balance issues so this seems reasonable. Continue BB and statin. 2. Lightheadedness - transient, mild, lasting 10 seconds 1-2x per day. Not specifically orthostatic in nature. Present even before recent hospitalization. Recent carotid duplex and echo unrevealing. Will plan to obtain 3 day Zio to exclude arrhythmia. If monitor is unremarkable and lightheadedness persists, would suggest returning to primary care for evaluation. Will also check BMET/Mg given h/o PACs/PVCs. 3. CKD stage III - recheck post-cath BMET today in clinic to ensure stability. He follows with Dr. Ronalee BeltsBhandari for nephrology. 4. Essential HTN - blood pressure is currently well controlled. He does note his cuff has a hard time registering sometimes with his ectopy (which has been chronic for years per pt). We discussed perhaps trial of Omron brand. 5. Hyperlipidemia - statin recently adjusted. Recheck CMET/lipids in 6 weeks. 6. Mild carotid artery disease - per duplex 09/2019. Will defer to primary cardiologist on follow-up how often he would like to repeat study. Continue statin. 7. Abnormal TSH - last assessed 05/2019, will recheck today with free T4. Patient started taking levothyroxine on empty stomach.  Disposition: F/u with Dr. Clifton JamesMcAlhany in 4 months. Continue regular f/u for PCP given h/o macrocytic appearing anemia - B12 was wnl in 08/2019.  Medication Adjustments/Labs and Tests Ordered: Current medicines are reviewed at length with the patient today.  Concerns regarding medicines are outlined above. Medication changes, Labs and Tests ordered today are summarized above and listed in the Patient Instructions accessible in Encounters. Did not bill TOC as it has been 15 days  since DC.  Signed, Laurann Montanaayna N Gaynel Schaafsma, PA-C  10/26/2019 11:38 AM    [facility] [address], [place], [place]  [address] Phone: [phone]) [phone]; Fax: [phone]

## 2019-10-26 ENCOUNTER — Other Ambulatory Visit: Payer: Self-pay

## 2019-10-26 ENCOUNTER — Encounter: Payer: Self-pay | Admitting: *Deleted

## 2019-10-26 ENCOUNTER — Ambulatory Visit: Payer: Medicare Other | Admitting: Physician Assistant

## 2019-10-26 ENCOUNTER — Encounter: Payer: Self-pay | Admitting: Physician Assistant

## 2019-10-26 VITALS — BP 110/76 | HR 81 | Ht 70.0 in | Wt 214.8 lb

## 2019-10-26 DIAGNOSIS — R42 Dizziness and giddiness: Secondary | ICD-10-CM

## 2019-10-26 DIAGNOSIS — I2510 Atherosclerotic heart disease of native coronary artery without angina pectoris: Secondary | ICD-10-CM | POA: Diagnosis not present

## 2019-10-26 DIAGNOSIS — E785 Hyperlipidemia, unspecified: Secondary | ICD-10-CM

## 2019-10-26 DIAGNOSIS — I779 Disorder of arteries and arterioles, unspecified: Secondary | ICD-10-CM

## 2019-10-26 DIAGNOSIS — N183 Chronic kidney disease, stage 3 unspecified: Secondary | ICD-10-CM

## 2019-10-26 DIAGNOSIS — I10 Essential (primary) hypertension: Secondary | ICD-10-CM

## 2019-10-26 DIAGNOSIS — R7989 Other specified abnormal findings of blood chemistry: Secondary | ICD-10-CM | POA: Diagnosis not present

## 2019-10-26 NOTE — Progress Notes (Signed)
Patient ID: Richard Ho, male   DOB: 1940-07-15, 79 y.o.   MRN: [id number] 3 day ZIO XT shipped to patients home.

## 2019-10-26 NOTE — Patient Instructions (Addendum)
Medication Instructions:  Your physician recommends that you continue on your current medications as directed. Please refer to the Current Medication list given to you today.  *If you need a refill on your cardiac medications before your next appointment, please call your pharmacy*   Lab Work: TODAY:  BMET, MAG, TSH, & FREE T4 6 WEEKS:  FASTING LIPID & CMET  If you have labs (blood work) drawn today and your tests are completely normal, you will receive your results only by: Marland Kitchen MyChart Message (if you have MyChart) OR . A paper copy in the mail If you have any lab test that is abnormal or we need to change your treatment, we will call you to review the results.   Testing/Procedures: Christena Deem- Long Term Monitor Instructions   Your physician has requested you wear your ZIO patch monitor 3 days.   This is a single patch monitor.  [employer] supplies one patch monitor per enrollment.  Additional stickers are not available.   Please do not apply patch if you will be having a Nuclear Stress Test, Echocardiogram, Cardiac CT, MRI, or Chest Xray during the time frame you would be wearing the monitor. The patch cannot be worn during these tests.  You cannot remove and re-apply the ZIO XT patch monitor.   Your ZIO patch monitor will be sent USPS Priority mail from [facility] directly to your home address. The monitor may also be mailed to a PO BOX if home delivery is not available.   It may take 3-5 days to receive your monitor after you have been enrolled.   Once you have received you monitor, please review enclosed instructions.  Your monitor has already been registered assigning a specific monitor serial # to you.   Applying the monitor   Shave hair from upper left chest.   Hold abrader disc by orange tab.  Rub abrader in 40 strokes over left upper chest as indicated in your monitor instructions.   Clean area with 4 enclosed alcohol pads .  Use all pads to assure are is cleaned  thoroughly.  Let dry.   Apply patch as indicated in monitor instructions.  Patch will be place under collarbone on left side of chest with arrow pointing upward.   Rub patch adhesive wings for 2 minutes.Remove white label marked "1".  Remove white label marked "2".  Rub patch adhesive wings for 2 additional minutes.   While looking in a mirror, press and release button in center of patch.  A small green light will flash 3-4 times .  This will be your only indicator the monitor has been turned on.     Do not shower for the first 24 hours.  You may shower after the first 24 hours.   Press button if you feel a symptom. You will hear a small click.  Record Date, Time and Symptom in the Patient Log Book.   When you are ready to remove patch, follow instructions on last 2 pages of Patient Log Book.  Stick patch monitor onto last page of Patient Log Book.   Place Patient Log Book in [place] box.  Use locking tab on box and tape box closed securely.  The [employer] and [employer] has [employer] on it.  Please place in mailbox as soon as possible.  Your physician should have your test results approximately 7 days after the monitor has been mailed back to [facility].   Call [facility] Customer Care at [phone] if you  have questions regarding your ZIO XT patch monitor.  Call them immediately if you see an orange light blinking on your monitor.   If your monitor falls off in less than 4 days contact our Monitor department at [phone].  If your monitor becomes loose or falls off after 4 days call [employer] at [phone] for suggestions on securing your monitor.     Follow-Up: At [facility], you and your health needs are our priority.  As part of our continuing mission to provide you with exceptional heart care, we have created designated Provider Care Teams.  These Care Teams include your primary Cardiologist (physician) and Advanced Practice Providers (APPs -  Physician Assistants and  Nurse Practitioners) who all work together to provide you with the care you need, when you need it.  We recommend signing up for the patient portal called "MyChart".  Sign up information is provided on this After Visit Summary.  MyChart is used to connect with patients for Virtual Visits (Telemedicine).  Patients are able to view lab/test results, encounter notes, upcoming appointments, etc.  Non-urgent messages can be sent to your provider as well.   To learn more about what you can do with MyChart, go to [email or web address].    Your next appointment:   4 month(s)  The format for your next appointment:   In Person  Provider:   You may see Lauree Chandler, MD or one of the following Advanced Practice Providers on your designated Care Team:    Melina Copa, PA-C  Ermalinda Barrios, PA-C    Other Instructions

## 2019-10-27 ENCOUNTER — Encounter: Payer: Self-pay | Admitting: Internal Medicine

## 2019-10-27 ENCOUNTER — Telehealth: Payer: Self-pay | Admitting: *Deleted

## 2019-10-27 ENCOUNTER — Encounter (HOSPITAL_COMMUNITY): Payer: Self-pay

## 2019-10-27 LAB — MAGNESIUM: Magnesium: 2 mg/dL (ref 1.6–2.3)

## 2019-10-27 LAB — BASIC METABOLIC PANEL
BUN/Creatinine Ratio: 17 (ref 10–24)
BUN: 31 mg/dL — ABNORMAL HIGH (ref 8–27)
CO2: 23 mmol/L (ref 20–29)
Calcium: 9.9 mg/dL (ref 8.6–10.2)
Chloride: 101 mmol/L (ref 96–106)
Creatinine, Ser: 1.83 mg/dL — ABNORMAL HIGH (ref 0.76–1.27)
GFR calc Af Amer: 40 mL/min/{1.73_m2} — ABNORMAL LOW (ref >59)
GFR calc non Af Amer: 35 mL/min/{1.73_m2} — ABNORMAL LOW (ref >59)
Glucose: 93 mg/dL (ref 65–99)
Potassium: 4.8 mmol/L (ref 3.5–5.2)
Sodium: 139 mmol/L (ref 134–144)

## 2019-10-27 LAB — TSH: TSH: 4.59 u[IU]/mL — ABNORMAL HIGH (ref 0.450–4.500)

## 2019-10-27 LAB — T4, FREE: Free T4: 1.14 ng/dL (ref 0.82–1.77)

## 2019-10-27 MED ORDER — FUROSEMIDE 20 MG PO TABS
20.0000 mg | ORAL_TABLET | ORAL | 2 refills | Status: DC
Start: 2019-10-27 — End: 2020-01-17

## 2019-10-27 NOTE — Telephone Encounter (Signed)
-----   Message from Laurann Montana, PA-C sent at 10/27/2019  8:00 AM EDT ----- Please let pt know labs are overall OK except few abnormalities: - kidney function remains slightly above his prior baseline. He appears to have been 1.3-1.75 in the past, now 1.83. Would recommend he go to Lasix every other day for now and f/u with his kidney doctor or primary care within 1 week for recheck - thyroid function remains abnormal with elevated TSH so should touch base with PCP about plan for adjustment/follow-up Continue plan as discussed otherwise.

## 2019-10-27 NOTE — Telephone Encounter (Signed)
Pt notified about lab results and recommendations per Dayna Dunn PA-C.  Advised the pt to decrease his lasix to 20 mg po every other day.  Updated this in his med list and a note to pharmacy to fill this at a later date, for the pt has enough on hand at this time.  Informed the pt that per Ronie Spies PA-C, she would like for him to schedule a follow-up appt in one week to see his PCP or Nephrologist, for further follow-up and maintenance of his renal function and TSH level.  Informed the pt that with his TSH level being abnormal, his PCP will need to further manage this, and adjust his dosage of levothyroxine.  Informed the pt that I will route his lab results to both his PCP and his Nephrologist, so that they will have this, when they see him for one week follow-up.  Pt verbalized understanding and agrees with this plan.  Pt states he will call his Nephrologist to report his current kidney function, and to further advise on that, and he will call his PCP today, to schedule a one week follow-up/management, for abnormal renal function and TSH level.

## 2019-10-31 ENCOUNTER — Other Ambulatory Visit (INDEPENDENT_AMBULATORY_CARE_PROVIDER_SITE_OTHER): Payer: Medicare Other

## 2019-10-31 DIAGNOSIS — N1831 Chronic kidney disease, stage 3a: Secondary | ICD-10-CM | POA: Diagnosis not present

## 2019-10-31 DIAGNOSIS — E1122 Type 2 diabetes mellitus with diabetic chronic kidney disease: Secondary | ICD-10-CM | POA: Diagnosis not present

## 2019-10-31 DIAGNOSIS — R42 Dizziness and giddiness: Secondary | ICD-10-CM

## 2019-10-31 DIAGNOSIS — D631 Anemia in chronic kidney disease: Secondary | ICD-10-CM | POA: Diagnosis not present

## 2019-10-31 DIAGNOSIS — I129 Hypertensive chronic kidney disease with stage 1 through stage 4 chronic kidney disease, or unspecified chronic kidney disease: Secondary | ICD-10-CM | POA: Diagnosis not present

## 2019-10-31 NOTE — Telephone Encounter (Signed)
Pt has called back and scheduled.

## 2019-11-01 ENCOUNTER — Telehealth (HOSPITAL_COMMUNITY): Payer: Self-pay

## 2019-11-01 NOTE — Telephone Encounter (Signed)
Pt called back and stated that he is going to decline cardiac rehab because of cost and will be doing his own exercise at home. Closed referral

## 2019-11-02 ENCOUNTER — Telehealth (INDEPENDENT_AMBULATORY_CARE_PROVIDER_SITE_OTHER): Payer: Medicare Other | Admitting: Internal Medicine

## 2019-11-02 ENCOUNTER — Encounter: Payer: Self-pay | Admitting: Internal Medicine

## 2019-11-02 DIAGNOSIS — E039 Hypothyroidism, unspecified: Secondary | ICD-10-CM | POA: Diagnosis not present

## 2019-11-02 MED ORDER — LEVOTHYROXINE SODIUM 100 MCG PO TABS: 100.0000 ug | ORAL_TABLET | Freq: Every day | ORAL | 1 refills | Status: DC

## 2019-11-02 NOTE — Progress Notes (Signed)
Virtual Visit via Video Note  I connected with Richard Ho on 11/02/19 at  3:30 PM EDT by a video enabled telemedicine application and verified that I am speaking with the correct person using two identifiers.  Location patient: home Location provider: work office Persons participating in the virtual visit: patient, provider  I discussed the limitations of evaluation and management by telemedicine and the availability of in person appointments. The patient expressed understanding and agreed to proceed.   HPI: He had a TSH drawn by his cardiologist that was elevated at 4.590.  Was asked to follow-up with me for thyroid medication adjustment.  He is currently on 75 mcg of Synthroid daily.   ROS: Constitutional: Denies fever, chills, diaphoresis, appetite change and fatigue.  HEENT: Denies photophobia, eye pain, redness, hearing loss, ear pain, congestion, sore throat, rhinorrhea, sneezing, mouth sores, trouble swallowing, neck pain, neck stiffness and tinnitus.   Respiratory: Denies SOB, DOE, cough, chest tightness,  and wheezing.   Cardiovascular: Denies chest pain, palpitations and leg swelling.  Gastrointestinal: Denies nausea, vomiting, abdominal pain, diarrhea, constipation, blood in stool and abdominal distention.  Genitourinary: Denies dysuria, urgency, frequency, hematuria, flank pain and difficulty urinating.  Endocrine: Denies: hot or cold intolerance, sweats, changes in hair or nails, polyuria, polydipsia. Musculoskeletal: Denies myalgias, back pain, joint swelling, arthralgias and gait problem.  Skin: Denies pallor, rash and wound.  Neurological: Denies dizziness, seizures, syncope, weakness, light-headedness, numbness and headaches.  Hematological: Denies adenopathy. Easy bruising, personal or family bleeding history  Psychiatric/Behavioral: Denies suicidal ideation, mood changes, confusion, nervousness, sleep disturbance and agitation   Past Medical History:    Diagnosis Date  . Anemia   . Asthma   . CKD (chronic kidney disease), stage III   . Coronary artery disease 1991   a) Angioplasty LAD 1991 [place] b) MI in 1995 at  [facility] in [place], med Rx c) Cath 10/2019 for NSTEMI - see report -> med rx.  . Diabetes mellitus   . GERD (gastroesophageal reflux disease)   . Hearing loss   . HTN (hypertension)   . Hyperlipidemia   . Hypothyroidism   . Myocardial infarct ([place])   . Nephrolithiasis   . Osteopenia   . Premature atrial contractions   . PVC's (premature ventricular contractions)   . Snake bite     Past Surgical History:  Procedure Laterality Date  . ANGIOPLASTY  1991  . CARDIAC CATHETERIZATION  2005   Moderate LCx and LAD disease and severe diffuse RCA disease  . CARDIAC CATHETERIZATION  01/2012   normal left main, 50% pLAD stenosis, dLAD mild luminal irregularities, D1 30-40% stenosis at take off; subtotally occluded/severely diseased OM1, mid 80% stenosis in small OM2, long prox and mid RCA stenoses ranging from 60-95%, severely diseased PDA, distal PLSA occluded filling via L-R collateral; normal LV function; inferior lateral basal akinesis.  . CHOLECYSTECTOMY  2012  . EYE SURGERY  2010   cataract - right, has left done too  . INGUINAL HERNIA REPAIR    . INTRAVASCULAR PRESSURE WIRE/FFR STUDY N/A 10/10/2019   Procedure: INTRAVASCULAR PRESSURE WIRE/FFR STUDY;  Surgeon: Lorretta Harp, MD;  Location: [place] [facility];  Service: Cardiovascular;  Laterality: N/A;  . LEFT HEART CATH AND CORONARY ANGIOGRAPHY N/A 10/10/2019   Procedure: LEFT HEART CATH AND CORONARY ANGIOGRAPHY;  Surgeon: Lorretta Harp, MD;  Location: [place] [facility];  Service: Cardiovascular;  Laterality: N/A;  . LEFT HEART CATHETERIZATION WITH CORONARY ANGIOGRAM N/A 01/28/2012  Procedure: LEFT HEART CATHETERIZATION WITH CORONARY ANGIOGRAM;  Surgeon: Iran Ouch, MD;  Location: [facility];  Service: Cardiovascular;  Laterality: N/A;  . LEFT HEART  CATHETERIZATION WITH CORONARY ANGIOGRAM N/A 03/17/2014   Procedure: LEFT HEART CATHETERIZATION WITH CORONARY ANGIOGRAM;  Surgeon: Kathleene Hazel, MD;  Location: [facility] CATH LAB;  Service: Cardiovascular;  Laterality: N/A;  . ORIF TIBIA & FIBULA FRACTURES    . PTCA    . SHOULDER ARTHROSCOPY WITH DISTAL CLAVICLE RESECTION Left 12/16/2018   Procedure: LEFT SHOULDER ARTHROSCOPY WITH DISTAL CLAVICLE RESECTION;  Surgeon: Bjorn Pippin, MD;  Location: [facility];  Service: Orthopedics;  Laterality: Left;  . SHOULDER ARTHROSCOPY WITH ROTATOR CUFF REPAIR AND SUBACROMIAL DECOMPRESSION Left 12/16/2018   Procedure: LEFT SHOULDER ARTHROSCOPY WITH ROTATOR CUFF REPAIR AND SUBACROMIAL DECOMPRESSION, DEBRIDEMENT;  Surgeon: Bjorn Pippin, MD;  Location: [facility];  Service: Orthopedics;  Laterality: Left;  . SHOULDER ARTHROSCOPY WITH SUBACROMIAL DECOMPRESSION AND BICEP TENDON REPAIR Left 12/16/2018   Procedure: SHOULDER ARTHROSCOPY WITH SUBACROMIAL DECOMPRESSION AND BICEP TENDON REPAIR;  Surgeon: Bjorn Pippin, MD;  Location:  SURGERY CENTER;  Service: Orthopedics;  Laterality: Left;  Marland Kitchen VASECTOMY      Family History  Problem Relation Age of Onset  . Asthma Mother   . Allergies Sister   . Coronary artery disease Other     SOCIAL HX:   reports that he has never smoked. He has never used smokeless tobacco. He reports current alcohol use. He reports that he does not use drugs.   Current Outpatient Medications:  .  ALPRAZolam (XANAX) 0.25 MG tablet, Take 1 tablet (0.25 mg total) by mouth at bedtime as needed for sleep., Disp: 20 tablet, Rfl: 0 .  aspirin EC 81 MG tablet, Take 81 mg by mouth daily., Disp: , Rfl:  .  atorvastatin (LIPITOR) 40 MG tablet, Take 1 tablet (40 mg total) by mouth daily., Disp: 90 tablet, Rfl: 3 .  carvedilol (COREG) 6.25 MG tablet, Take 1 tablet (6.25 mg total) by mouth 2 (two) times daily with a meal., Disp: 60 tablet, Rfl: 6 .  ferrous sulfate  325 (65 FE) MG EC tablet, Take 325 mg by mouth daily. , Disp: , Rfl:  .  fluticasone (FLONASE) 50 MCG/ACT nasal spray, USE 1 TO 2 SPRAY(S) IN EACH NOSTRIL ONCE DAILY AS DIRECTED, Disp: 48 g, Rfl: 0 .  fluticasone (FLOVENT HFA) 110 MCG/ACT inhaler, Inhale three puffs three times daily during flare-up.  Rinse, gargle, and spit after use., Disp: 3 Inhaler, Rfl: 1 .  fluticasone furoate-vilanterol (BREO ELLIPTA) 200-25 MCG/INH AEPB, Inhale 1 puff into the lungs daily. Rinse, gargle, and spit after use., Disp: 180 each, Rfl: 1 .  furosemide (LASIX) 20 MG tablet, Take 1 tablet (20 mg total) by mouth every other day., Disp: 30 tablet, Rfl: 2 .  isosorbide mononitrate (IMDUR) 30 MG 24 hr tablet, TAKE ONE TABLET BY MOUTH TWICE DAILY, Disp: 180 tablet, Rfl: 3 .  levothyroxine (SYNTHROID) 100 MCG tablet, Take 1 tablet (100 mcg total) by mouth daily., Disp: 90 tablet, Rfl: 1 .  metFORMIN (GLUCOPHAGE) 500 MG tablet, Take 1 tablet (500 mg total) by mouth 2 (two) times daily with a meal., Disp: 180 tablet, Rfl: 1 .  nitroGLYCERIN (NITROSTAT) 0.4 MG SL tablet, Place 1 tablet (0.4 mg total) under the tongue every 5 (five) minutes x 3 doses as needed for chest pain., Disp: 25 tablet, Rfl: 3 .  pantoprazole (PROTONIX) 40 MG tablet, Take  1 tablet (40 mg total) by mouth daily., Disp: 90 tablet, Rfl: 1 .  pregabalin (LYRICA) 75 MG capsule, Take 1 capsule (75 mg total) by mouth 2 (two) times daily., Disp: 180 capsule, Rfl: 1 .  tamsulosin (FLOMAX) 0.4 MG CAPS capsule, TAKE ONE CAPSULE BY MOUTH DAILY, Disp: 30 capsule, Rfl: 5 .  Multiple Vitamins-Minerals (CENTRUM ADULTS PO), Take 1 tablet by mouth daily. , Disp: , Rfl:   EXAM:   VITALS per patient if applicable: Blood pressure of 114/58 with a heart rate of 87  GENERAL: alert, oriented, appears well and in no acute distress  HEENT: atraumatic, conjunttiva clear, no obvious abnormalities on inspection of external nose and ears  NECK: normal movements of the head and  neck  LUNGS: on inspection no signs of respiratory distress, breathing rate appears normal, no obvious gross increased work of breathing, gasping or wheezing  CV: no obvious cyanosis  MS: moves all visible extremities without noticeable abnormality  PSYCH/NEURO: pleasant and cooperative, no obvious depression or anxiety, speech and thought processing grossly intact  ASSESSMENT AND PLAN:   Acquired hypothyroidism  -Increase levothyroxine from 75 to 100 mcg. -Recheck TSH in 6 weeks.    I discussed the assessment and treatment plan with the patient. The patient was provided an opportunity to ask questions and all were answered. The patient agreed with the plan and demonstrated an understanding of the instructions.   The patient was advised to call back or seek an in-person evaluation if the symptoms worsen or if the condition fails to improve as anticipated.    Chaya Jan, MD  [facility]

## 2019-11-08 DIAGNOSIS — H349 Unspecified retinal vascular occlusion: Secondary | ICD-10-CM | POA: Diagnosis not present

## 2019-11-24 DIAGNOSIS — R42 Dizziness and giddiness: Secondary | ICD-10-CM | POA: Diagnosis not present

## 2019-11-25 ENCOUNTER — Telehealth: Payer: Self-pay | Admitting: Nurse Practitioner

## 2019-11-25 DIAGNOSIS — H04123 Dry eye syndrome of bilateral lacrimal glands: Secondary | ICD-10-CM | POA: Diagnosis not present

## 2019-11-25 NOTE — Telephone Encounter (Signed)
-----   Message from Kathleene Hazel, MD sent at 11/25/2019 12:43 PM EDT ----- Monitor ok with several quick bursts of VT, 5 beats. Continue beta blocker. Thayer Ohm

## 2019-11-25 NOTE — Telephone Encounter (Signed)
Results reviewed with patient and questions were answered to his satisfaction. Patient verbalized understanding and was grateful for the report. He thanked me for the call.

## 2019-11-28 ENCOUNTER — Telehealth: Payer: Self-pay | Admitting: *Deleted

## 2019-11-28 DIAGNOSIS — I493 Ventricular premature depolarization: Secondary | ICD-10-CM

## 2019-11-28 NOTE — Telephone Encounter (Signed)
-----   Message from Laurann Montana, [place] sent at 11/27/2019 10:35 AM EDT ----- Please let pt know that monitor showed quite a few of the extra skips from the bottom of his heart, more than is normal. There were also a few short runs of these skips appearing together. He had 17% of his beats as PVCs - would recommend referral to EP team to determine if further adjustment to medicine regimen is necessary.

## 2019-12-01 DIAGNOSIS — N401 Enlarged prostate with lower urinary tract symptoms: Secondary | ICD-10-CM | POA: Diagnosis not present

## 2019-12-01 DIAGNOSIS — N5201 Erectile dysfunction due to arterial insufficiency: Secondary | ICD-10-CM | POA: Diagnosis not present

## 2019-12-01 DIAGNOSIS — R350 Frequency of micturition: Secondary | ICD-10-CM | POA: Diagnosis not present

## 2019-12-02 ENCOUNTER — Telehealth: Payer: Self-pay

## 2019-12-02 NOTE — Telephone Encounter (Signed)
Called patient to confirm telehealth MyChart video visit scheduled for 12/05/19 at 3:15 pm.

## 2019-12-05 ENCOUNTER — Telehealth (INDEPENDENT_AMBULATORY_CARE_PROVIDER_SITE_OTHER): Payer: Medicare Other | Admitting: Internal Medicine

## 2019-12-05 ENCOUNTER — Other Ambulatory Visit: Payer: Self-pay

## 2019-12-05 ENCOUNTER — Encounter: Payer: Self-pay | Admitting: Internal Medicine

## 2019-12-05 ENCOUNTER — Other Ambulatory Visit: Payer: Medicare Other | Admitting: *Deleted

## 2019-12-05 VITALS — Ht 70.0 in | Wt 210.0 lb

## 2019-12-05 DIAGNOSIS — N183 Chronic kidney disease, stage 3 unspecified: Secondary | ICD-10-CM

## 2019-12-05 DIAGNOSIS — I2510 Atherosclerotic heart disease of native coronary artery without angina pectoris: Secondary | ICD-10-CM

## 2019-12-05 DIAGNOSIS — I10 Essential (primary) hypertension: Secondary | ICD-10-CM | POA: Diagnosis not present

## 2019-12-05 DIAGNOSIS — I493 Ventricular premature depolarization: Secondary | ICD-10-CM

## 2019-12-05 LAB — COMPREHENSIVE METABOLIC PANEL
ALT: 14 IU/L (ref 0–44)
AST: 18 IU/L (ref 0–40)
Albumin/Globulin Ratio: 1.9 (ref 1.2–2.2)
Albumin: 4 g/dL (ref 3.7–4.7)
Alkaline Phosphatase: 99 IU/L (ref 48–121)
BUN/Creatinine Ratio: 15 (ref 10–24)
BUN: 22 mg/dL (ref 8–27)
Bilirubin Total: 0.4 mg/dL (ref 0.0–1.2)
CO2: 22 mmol/L (ref 20–29)
Calcium: 9.7 mg/dL (ref 8.6–10.2)
Chloride: 104 mmol/L (ref 96–106)
Creatinine, Ser: 1.48 mg/dL — ABNORMAL HIGH (ref 0.76–1.27)
GFR calc Af Amer: 52 mL/min/{1.73_m2} — ABNORMAL LOW (ref >59)
GFR calc non Af Amer: 45 mL/min/{1.73_m2} — ABNORMAL LOW (ref >59)
Globulin, Total: 2.1 g/dL (ref 1.5–4.5)
Glucose: 114 mg/dL — ABNORMAL HIGH (ref 65–99)
Potassium: 4.9 mmol/L (ref 3.5–5.2)
Sodium: 140 mmol/L (ref 134–144)
Total Protein: 6.1 g/dL (ref 6.0–8.5)

## 2019-12-05 LAB — LIPID PANEL
Chol/HDL Ratio: 2.8 ratio (ref 0.0–5.0)
Cholesterol, Total: 113 mg/dL (ref 100–199)
HDL: 41 mg/dL (ref >39)
LDL Chol Calc (NIH): 51 mg/dL (ref 0–99)
Triglycerides: 119 mg/dL (ref 0–149)
VLDL Cholesterol Cal: 21 mg/dL (ref 5–40)

## 2019-12-05 NOTE — Progress Notes (Signed)
Electrophysiology TeleHealth Note   Due to national recommendations of social distancing due to COVID 19, an audio/video telehealth visit is felt to be most appropriate for this patient at this time.  See MyChart message from today for the patient's consent to telehealth for [facility].  Date:  12/05/2019   ID:  Richard Ho, DOB [birth year], MRN [id number]  Location: patient's home  Provider location:  [place]  Evaluation Performed: Follow-up visit  PCP:  Isaac Bliss, Rayford Halsted, MD   Electrophysiologist:  Dr Rayann Heman  Chief Complaint:  PVC evaluation   History of Present Illness:    Richard Ho is a 79 y.o. male who presents via telehealth conferencing today. He is referred by Melina Copa, PA for evaluation of PVC's. He has had lightheadedness for which a Zio monitor was placed and demonstrated 17% PVC burden. He was therefore referred to EP for further evaluation.   He is largely unaware of PVC's without palpitations. Today, he denies symptoms of palpitations, chest pain, shortness of breath,  lower extremity edema, dizziness, presyncope, or syncope.  The patient is otherwise without complaint today.   Past Medical History:  Diagnosis Date  . Anemia   . Asthma   . CKD (chronic kidney disease), stage III   . Coronary artery disease 1991   a) Angioplasty LAD 1991 [place] b) MI in 1995 at  [facility] in [place], med Rx c) Cath 10/2019 for NSTEMI - see report -> med rx.  . Diabetes mellitus   . GERD (gastroesophageal reflux disease)   . Hearing loss   . HTN (hypertension)   . Hyperlipidemia   . Hypothyroidism   . Myocardial infarct ([place])   . Nephrolithiasis   . Osteopenia   . Premature atrial contractions   . PVC's (premature ventricular contractions)   . Snake bite     Past Surgical History:  Procedure Laterality Date  . ANGIOPLASTY  1991  . CARDIAC CATHETERIZATION  2005   Moderate LCx and LAD disease and severe diffuse RCA disease  .  CARDIAC CATHETERIZATION  01/2012   normal left main, 50% pLAD stenosis, dLAD mild luminal irregularities, D1 30-40% stenosis at take off; subtotally occluded/severely diseased OM1, mid 80% stenosis in small OM2, long prox and mid RCA stenoses ranging from 60-95%, severely diseased PDA, distal PLSA occluded filling via L-R collateral; normal LV function; inferior lateral basal akinesis.  . CHOLECYSTECTOMY  2012  . EYE SURGERY  2010   cataract - right, has left done too  . INGUINAL HERNIA REPAIR    . INTRAVASCULAR PRESSURE WIRE/FFR STUDY N/A 10/10/2019   Procedure: INTRAVASCULAR PRESSURE WIRE/FFR STUDY;  Surgeon: Lorretta Harp, MD;  Location: [place] [facility];  Service: Cardiovascular;  Laterality: N/A;  . LEFT HEART CATH AND CORONARY ANGIOGRAPHY N/A 10/10/2019   Procedure: LEFT HEART CATH AND CORONARY ANGIOGRAPHY;  Surgeon: Lorretta Harp, MD;  Location: [place] [facility];  Service: Cardiovascular;  Laterality: N/A;  . LEFT HEART CATHETERIZATION WITH CORONARY ANGIOGRAM N/A 01/28/2012   Procedure: LEFT HEART CATHETERIZATION WITH CORONARY ANGIOGRAM;  Surgeon: [place], MD;  Location: [place] [facility];  Service: Cardiovascular;  Laterality: N/A;  . LEFT HEART CATHETERIZATION WITH CORONARY ANGIOGRAM N/A 03/17/2014   Procedure: LEFT HEART CATHETERIZATION WITH CORONARY ANGIOGRAM;  Surgeon: Burnell Blanks, MD;  Location: [facility] C[facility];  Service: Cardiovascular;  Laterality: N/A;  . ORIF TIBIA & FIBULA FRACTURES    . PTCA    . SHOULDER  ARTHROSCOPY WITH DISTAL CLAVICLE RESECTION Left 12/16/2018   Procedure: LEFT SHOULDER ARTHROSCOPY WITH DISTAL CLAVICLE RESECTION;  Surgeon: Bjorn Pippin, MD;  Location: [facility];  Service: Orthopedics;  Laterality: Left;  . SHOULDER ARTHROSCOPY WITH ROTATOR CUFF REPAIR AND SUBACROMIAL DECOMPRESSION Left 12/16/2018   Procedure: LEFT SHOULDER ARTHROSCOPY WITH ROTATOR CUFF REPAIR AND SUBACROMIAL DECOMPRESSION, DEBRIDEMENT;  Surgeon: Bjorn Pippin, MD;   Location: [facility];  Service: Orthopedics;  Laterality: Left;  . SHOULDER ARTHROSCOPY WITH SUBACROMIAL DECOMPRESSION AND BICEP TENDON REPAIR Left 12/16/2018   Procedure: SHOULDER ARTHROSCOPY WITH SUBACROMIAL DECOMPRESSION AND BICEP TENDON REPAIR;  Surgeon: Bjorn Pippin, MD;  Location: [facility];  Service: Orthopedics;  Laterality: Left;  Marland Kitchen VASECTOMY      Current Outpatient Medications  Medication Sig Dispense Refill  . ALPRAZolam (XANAX) 0.25 MG tablet Take 1 tablet (0.25 mg total) by mouth at bedtime as needed for sleep. 20 tablet 0  . aspirin EC 81 MG tablet Take 81 mg by mouth daily.    Marland Kitchen atorvastatin (LIPITOR) 40 MG tablet Take 1 tablet (40 mg total) by mouth daily. 90 tablet 3  . carvedilol (COREG) 6.25 MG tablet Take 1 tablet (6.25 mg total) by mouth 2 (two) times daily with a meal. 60 tablet 6  . ferrous sulfate 325 (65 FE) MG EC tablet Take 325 mg by mouth daily.     . fluticasone (FLONASE) 50 MCG/ACT nasal spray USE 1 TO 2 SPRAY(S) IN EACH NOSTRIL ONCE DAILY AS DIRECTED 48 g 0  . fluticasone (FLOVENT HFA) 110 MCG/ACT inhaler Inhale three puffs three times daily during flare-up.  Rinse, gargle, and spit after use. 3 Inhaler 1  . fluticasone furoate-vilanterol (BREO ELLIPTA) 200-25 MCG/INH AEPB Inhale 1 puff into the lungs daily. Rinse, gargle, and spit after use. 180 each 1  . furosemide (LASIX) 20 MG tablet Take 1 tablet (20 mg total) by mouth every other day. 30 tablet 2  . isosorbide mononitrate (IMDUR) 30 MG 24 hr tablet TAKE ONE TABLET BY MOUTH TWICE DAILY 180 tablet 3  . levothyroxine (SYNTHROID) 100 MCG tablet Take 1 tablet (100 mcg total) by mouth daily. 90 tablet 1  . metFORMIN (GLUCOPHAGE) 500 MG tablet Take 1 tablet (500 mg total) by mouth 2 (two) times daily with a meal. 180 tablet 1  . nitroGLYCERIN (NITROSTAT) 0.4 MG SL tablet Place 1 tablet (0.4 mg total) under the tongue every 5 (five) minutes x 3 doses as needed for chest pain. 25 tablet 3   . pantoprazole (PROTONIX) 40 MG tablet Take 1 tablet (40 mg total) by mouth daily. 90 tablet 1  . pregabalin (LYRICA) 75 MG capsule Take 1 capsule (75 mg total) by mouth 2 (two) times daily. 180 capsule 1  . tamsulosin (FLOMAX) 0.4 MG CAPS capsule TAKE ONE CAPSULE BY MOUTH DAILY 30 capsule 5   No current facility-administered medications for this visit.    Allergies:   Trazodone and nefazodone and Meloxicam   Social History:  The patient  reports that he has never smoked. He has never used smokeless tobacco. He reports current alcohol use. He reports that he does not use drugs.   ROS:  Please see the history of present illness.   All other systems are personally reviewed and negative.    Exam:    Vital Signs:  Ht 5\' 10"  (1.778 m)   Wt 210 lb (95.3 kg)   BMI 30.13 kg/m   Well sounding and appearing, alert  and conversant, regular work of breathing,  good skin color Eyes- anicteric, neuro- grossly intact, skin- no apparent rash or lesions or cyanosis, mouth- oral mucosa is pink  Labs/Other Tests and Data Reviewed:    Recent Labs: 08/18/2019: ALT 14 10/09/2019: B Natriuretic Peptide 355.1 10/11/2019: Hemoglobin 11.5; Platelets 204 10/26/2019: BUN 31; Creatinine, Ser 1.83; Magnesium 2.0; Potassium 4.8; Sodium 139; TSH 4.590   Wt Readings from Last 3 Encounters:  12/05/19 210 lb (95.3 kg)  10/26/19 214 lb 12.8 oz (97.4 kg)  10/13/19 215 lb 9.6 oz (97.8 kg)      ASSESSMENT & PLAN:    1.  PVC's - multiple morphologies EF is normal  He is largely asymptomatic  Treatment options discussed today including medical therapy and ablation. He is clear that with lack of symptoms, he would like to make no changes at this time.  Could consider increasing Coreg if symptoms develop in the future.   2.  CAD Stable No change required today  3.  CKD stage III Stable No change required today    Risks, benefits and potential toxicities for medications prescribed and/or refilled reviewed  with patient today.   Follow-up:  With EP PRN    Patient Risk:  after full review of this patients clinical status, I feel that they are at moderate risk at this time.  Today, I have spent 15 minutes with the patient with telehealth technology discussing arrhythmia management .    SignedHillis Range, MD  12/05/2019 3:22 PM     [facility] [address] [place] [address] [phone] (office) [phone] (fax)

## 2019-12-06 ENCOUNTER — Telehealth: Payer: Self-pay | Admitting: Physician Assistant

## 2019-12-06 NOTE — Telephone Encounter (Signed)
Accessed pt's chart to see who called him yesterday. Transferred call to [employer].

## 2019-12-09 DIAGNOSIS — H04123 Dry eye syndrome of bilateral lacrimal glands: Secondary | ICD-10-CM | POA: Diagnosis not present

## 2019-12-15 ENCOUNTER — Encounter: Payer: Self-pay | Admitting: Internal Medicine

## 2019-12-15 DIAGNOSIS — R1310 Dysphagia, unspecified: Secondary | ICD-10-CM

## 2019-12-18 ENCOUNTER — Other Ambulatory Visit: Payer: Self-pay | Admitting: Internal Medicine

## 2019-12-18 DIAGNOSIS — E1129 Type 2 diabetes mellitus with other diabetic kidney complication: Secondary | ICD-10-CM

## 2019-12-19 ENCOUNTER — Encounter: Payer: Self-pay | Admitting: Gastroenterology

## 2019-12-26 ENCOUNTER — Encounter: Payer: Self-pay | Admitting: Internal Medicine

## 2019-12-26 DIAGNOSIS — N1831 Chronic kidney disease, stage 3a: Secondary | ICD-10-CM | POA: Diagnosis not present

## 2019-12-26 DIAGNOSIS — N189 Chronic kidney disease, unspecified: Secondary | ICD-10-CM | POA: Diagnosis not present

## 2019-12-26 DIAGNOSIS — N2581 Secondary hyperparathyroidism of renal origin: Secondary | ICD-10-CM | POA: Diagnosis not present

## 2019-12-27 ENCOUNTER — Encounter: Payer: Self-pay | Admitting: Internal Medicine

## 2020-01-02 DIAGNOSIS — E1122 Type 2 diabetes mellitus with diabetic chronic kidney disease: Secondary | ICD-10-CM | POA: Diagnosis not present

## 2020-01-02 DIAGNOSIS — N1831 Chronic kidney disease, stage 3a: Secondary | ICD-10-CM | POA: Diagnosis not present

## 2020-01-02 DIAGNOSIS — D631 Anemia in chronic kidney disease: Secondary | ICD-10-CM | POA: Diagnosis not present

## 2020-01-02 DIAGNOSIS — I129 Hypertensive chronic kidney disease with stage 1 through stage 4 chronic kidney disease, or unspecified chronic kidney disease: Secondary | ICD-10-CM | POA: Diagnosis not present

## 2020-01-04 ENCOUNTER — Other Ambulatory Visit: Payer: Self-pay | Admitting: Internal Medicine

## 2020-01-04 DIAGNOSIS — G4700 Insomnia, unspecified: Secondary | ICD-10-CM

## 2020-01-17 ENCOUNTER — Telehealth: Payer: Self-pay | Admitting: Internal Medicine

## 2020-01-17 ENCOUNTER — Ambulatory Visit: Payer: Medicare Other | Admitting: Allergy and Immunology

## 2020-01-17 ENCOUNTER — Encounter: Payer: Self-pay | Admitting: Gastroenterology

## 2020-01-17 ENCOUNTER — Ambulatory Visit: Payer: Medicare Other | Admitting: Gastroenterology

## 2020-01-17 ENCOUNTER — Other Ambulatory Visit: Payer: Self-pay

## 2020-01-17 ENCOUNTER — Encounter: Payer: Self-pay | Admitting: Allergy and Immunology

## 2020-01-17 ENCOUNTER — Other Ambulatory Visit: Payer: Self-pay | Admitting: Allergy and Immunology

## 2020-01-17 VITALS — BP 110/70 | HR 64 | Resp 16

## 2020-01-17 VITALS — BP 124/60 | HR 80 | Ht 70.0 in | Wt 213.8 lb

## 2020-01-17 DIAGNOSIS — R1310 Dysphagia, unspecified: Secondary | ICD-10-CM

## 2020-01-17 DIAGNOSIS — K219 Gastro-esophageal reflux disease without esophagitis: Secondary | ICD-10-CM

## 2020-01-17 DIAGNOSIS — J449 Chronic obstructive pulmonary disease, unspecified: Secondary | ICD-10-CM

## 2020-01-17 DIAGNOSIS — J3089 Other allergic rhinitis: Secondary | ICD-10-CM | POA: Diagnosis not present

## 2020-01-17 DIAGNOSIS — J4489 Other specified chronic obstructive pulmonary disease: Secondary | ICD-10-CM

## 2020-01-17 MED ORDER — IPRATROPIUM BROMIDE 0.06 % NA SOLN
NASAL | 5 refills | Status: DC
Start: 2020-01-17 — End: 2020-07-31

## 2020-01-17 MED ORDER — BREZTRI AEROSPHERE 160-9-4.8 MCG/ACT IN AERO: INHALATION_SPRAY | RESPIRATORY_TRACT | 5 refills | Status: DC

## 2020-01-17 NOTE — Telephone Encounter (Signed)
Pt  Would like  to know why his appointment was changed from a virtual visit to an office visit . Pt would like a call back on his cell at [phone]

## 2020-01-17 NOTE — Progress Notes (Signed)
[place] [facility] [place] - [place] - [name]   Follow-up Note  Referring Provider: Philip Aspen, Almira Bar* Primary Provider: Philip Aspen, Limmie Patricia, MD Date of Office Visit: 01/17/2020  Subjective:   Richard Ho (DOB: [birth year]) is a 79 y.o. male who [facility] in re-evaluation of the following:  HPI: Richard Ho presents to this clinic in reevaluation of COPD with a component of asthma, allergic rhinoconjunctivitis, and history of reflux.  His last visit to this clinic was 19 July 2019.  Overall his lower airway issue has been under excellent control and he rarely uses a short acting bronchodilator.  He does not really exercise to any significant degree.  He did develop a problem with use of his inhaled Breo.  Over the course of the past month or so he has developed some laryngitis and he had to discontinue his Breo and within 2 weeks his laryngitis resolved.  He has had very little problems with his nose at this point in time other than some occasional drippy nose when he eats.  He is not using his nasal ipratropium before he eats.  His reflux appears to be under pretty good control although he did have an obstructive event in June 2021 after eating a piece of chicken.  He is scheduled to see his gastroenterologist today.  He has received 2 Moderna Covid vaccinations.  Allergies as of 01/17/2020      Reactions   Trazodone And Nefazodone    "makes me freak out"   Meloxicam Other (See Comments)   "jumping out of my skin" Pt feels anxiety attacks when on this medicine      Medication List    ALPRAZolam 0.25 MG tablet Commonly known as: XANAX TAKE 1 TABLET BY MOUTH AT BEDTIME AS NEEDED FOR SLEEP   aspirin EC 81 MG tablet Take 81 mg by mouth daily.   atorvastatin 40 MG tablet Commonly known as: LIPITOR Take 1 tablet (40 mg total) by mouth daily.   Breo Ellipta 200-25 MCG/INH Aepb Generic drug: fluticasone  furoate-vilanterol Inhale 1 puff into the lungs daily. Rinse, gargle, and spit after use.   carvedilol 6.25 MG tablet Commonly known as: COREG Take 1 tablet (6.25 mg total) by mouth 2 (two) times daily with a meal.   ferrous sulfate 325 (65 FE) MG EC tablet Take 325 mg by mouth daily.   fluticasone 110 MCG/ACT inhaler Commonly known as: Flovent HFA Inhale three puffs three times daily during flare-up.  Rinse, gargle, and spit after use.   fluticasone 50 MCG/ACT nasal spray Commonly known as: FLONASE USE 1 TO 2 SPRAY(S) IN EACH NOSTRIL ONCE DAILY AS DIRECTED   isosorbide mononitrate 30 MG 24 hr tablet Commonly known as: IMDUR TAKE ONE TABLET BY MOUTH TWICE DAILY   levothyroxine 100 MCG tablet Commonly known as: SYNTHROID Take 1 tablet (100 mcg total) by mouth daily.   metFORMIN 500 MG tablet Commonly known as: GLUCOPHAGE TAKE 1 TABLET BY MOUTH TWICE DAILY WITH A MEAL   nitroGLYCERIN 0.4 MG SL tablet Commonly known as: NITROSTAT Place 1 tablet (0.4 mg total) under the tongue every 5 (five) minutes x 3 doses as needed for chest pain.   pantoprazole 40 MG tablet Commonly known as: PROTONIX Take 1 tablet (40 mg total) by mouth daily.   pregabalin 75 MG capsule Commonly known as: LYRICA Take 1 capsule (75 mg total) by mouth 2 (two) times daily.   tamsulosin 0.4 MG  Caps capsule Commonly known as: FLOMAX TAKE ONE CAPSULE BY MOUTH DAILY       Past Medical History:  Diagnosis Date  . Anemia   . Asthma   . CKD (chronic kidney disease), stage III   . Coronary artery disease 1991   a) Angioplasty LAD 1991 [place] b) MI in 1995 at  [facility] in [place], med Rx c) Cath 10/2019 for NSTEMI - see report -> med rx.  . Diabetes mellitus   . GERD (gastroesophageal reflux disease)   . Hearing loss   . HTN (hypertension)   . Hyperlipidemia   . Hypothyroidism   . Myocardial infarct (HCC)   . Nephrolithiasis   . Osteopenia   . Premature atrial contractions   . PVC's  (premature ventricular contractions)   . Snake bite     Past Surgical History:  Procedure Laterality Date  . ANGIOPLASTY  1991  . CARDIAC CATHETERIZATION  2005   Moderate LCx and LAD disease and severe diffuse RCA disease  . CARDIAC CATHETERIZATION  01/2012   normal left main, 50% pLAD stenosis, dLAD mild luminal irregularities, D1 30-40% stenosis at take off; subtotally occluded/severely diseased OM1, mid 80% stenosis in small OM2, long prox and mid RCA stenoses ranging from 60-95%, severely diseased PDA, distal PLSA occluded filling via L-R collateral; normal LV function; inferior lateral basal akinesis.  . CHOLECYSTECTOMY  2012  . EYE SURGERY  2010   cataract - right, has left done too  . INGUINAL HERNIA REPAIR    . INTRAVASCULAR PRESSURE WIRE/FFR STUDY N/A 10/10/2019   Procedure: INTRAVASCULAR PRESSURE WIRE/FFR STUDY;  Surgeon: Runell Gess, MD;  [facility];  Service: Cardiovascular;  Laterality: N/A;  . LEFT HEART CATH AND CORONARY ANGIOGRAPHY N/A 10/10/2019   Procedure: LEFT HEART CATH AND CORONARY ANGIOGRAPHY;  Surgeon: Runell Gess, MD;  [facility];  Service: Cardiovascular;  Laterality: N/A;  . LEFT HEART CATHETERIZATION WITH CORONARY ANGIOGRAM N/A 01/28/2012   Procedure: LEFT HEART CATHETERIZATION WITH CORONARY ANGIOGRAM;  Surgeon: Iran Ouch, MD;  Location: [facility];  Service: Cardiovascular;  Laterality: N/A;  . LEFT HEART CATHETERIZATION WITH CORONARY ANGIOGRAM N/A 03/17/2014   Procedure: LEFT HEART CATHETERIZATION WITH CORONARY ANGIOGRAM;  Surgeon: Kathleene Hazel, MD;  Location: [facility] CATH LAB;  Service: Cardiovascular;  Laterality: N/A;  . ORIF TIBIA & FIBULA FRACTURES    . PTCA    . SHOULDER ARTHROSCOPY WITH DISTAL CLAVICLE RESECTION Left 12/16/2018   Procedure: LEFT SHOULDER ARTHROSCOPY WITH DISTAL CLAVICLE RESECTION;  Surgeon: Bjorn Pippin, MD;  Location: [facility];  Service: Orthopedics;  Laterality:  Left;  . SHOULDER ARTHROSCOPY WITH ROTATOR CUFF REPAIR AND SUBACROMIAL DECOMPRESSION Left 12/16/2018   Procedure: LEFT SHOULDER ARTHROSCOPY WITH ROTATOR CUFF REPAIR AND SUBACROMIAL DECOMPRESSION, DEBRIDEMENT;  Surgeon: Bjorn Pippin, MD;  Location: [facility];  Service: Orthopedics;  Laterality: Left;  . SHOULDER ARTHROSCOPY WITH SUBACROMIAL DECOMPRESSION AND BICEP TENDON REPAIR Left 12/16/2018   Procedure: SHOULDER ARTHROSCOPY WITH SUBACROMIAL DECOMPRESSION AND BICEP TENDON REPAIR;  Surgeon: Bjorn Pippin, MD;  Location: [facility];  Service: Orthopedics;  Laterality: Left;  Marland Kitchen VASECTOMY      Review of systems negative except as noted in HPI / PMHx or noted below:  Review of Systems  Constitutional: Negative.   HENT: Negative.   Eyes: Negative.   Respiratory: Negative.   Cardiovascular: Negative.   Gastrointestinal: Negative.   Genitourinary: Negative.   Musculoskeletal:  Negative.   Skin: Negative.   Neurological: Negative.   Endo/Heme/Allergies: Negative.   Psychiatric/Behavioral: Negative.      Objective:   Vitals:   01/17/20 1052  BP: 110/70  Pulse: 64  Resp: 16          Physical Exam Constitutional:      Appearance: He is not diaphoretic.  HENT:     Head: Normocephalic.     Right Ear: Tympanic membrane, ear canal and external ear normal.     Left Ear: Tympanic membrane, ear canal and external ear normal.     Nose: Nose normal. No mucosal edema or rhinorrhea.     Mouth/Throat:     Pharynx: Uvula midline. No oropharyngeal exudate.  Eyes:     Conjunctiva/sclera: Conjunctivae normal.  Neck:     Thyroid: No thyromegaly.     Trachea: Trachea normal. No tracheal tenderness or tracheal deviation.  Cardiovascular:     Rate and Rhythm: Normal rate and regular rhythm.     Heart sounds: Normal heart sounds, S1 normal and S2 normal. No murmur heard.   Pulmonary:     Effort: No respiratory distress.     Breath sounds: Normal breath sounds. No  stridor. No wheezing or rales.  Lymphadenopathy:     Head:     Right side of head: No tonsillar adenopathy.     Left side of head: No tonsillar adenopathy.     Cervical: No cervical adenopathy.  Skin:    Findings: No erythema or rash.     Nails: There is no clubbing.  Neurological:     Mental Status: He is alert.     Diagnostics:    Spirometry was performed and demonstrated an FEV1 of 1.95 at 64 % of predicted.  Assessment and Plan:   1. COPD with asthma (HCC)   2. Other allergic rhinitis   3. Gastroesophageal reflux disease, unspecified whether esophagitis present     1. Continue to Perform Allergen avoidance measures - dust mite, pollens, molds  2. Continue to Treat and prevent inflammation:   A. Breztri - 2 inhalations 1-2 times per day with spacer  B. Flonase - 1-2 sprays each nostril one time per day  3. If needed:   A. Proventil or Ventolin HFA 2 puffs every 4-6 hours  B. OTC antihistamine - Claritin/Zyrtec/Allegra  C. nasal ipratropium 0.06% 2 sprays each nostril every 6 hours  D. Patanol - 1 drop each eye 1-2 times per day  4. "Action plan" for asthma flare up:   A. continue Breztri  B. add Flovent 110 3 inhalations 3 times a day with spacer  C. use Proventil or Ventolin HFA if needed  5. Continue treatment for reflux with Protonix 40 mg 2 times a day and vist with GI doctor  6. Obtain fall flu vaccine  7. Return to clinic in 6 months or earlier if problem  Because Dariusz has developed laryngitis with Virgel Bouquet we will now start him on Breztri  He should be able to do relatively well while using Breztri 1 time per day given his previous history of control of his COPD with asthma.  He will continue on Flonase for his upper airway disease and he has the option of using nasal ipratropium if needed.  He is going to continue on his proton pump inhibitor twice a day and visit with his gastroenterologist regarding his reflux with a recent episode of esophageal  obstruction.  I will see him back in his clinic in  6 months or earlier if there is a problem.  Allena Katz, MD Allergy / Immunology [place]

## 2020-01-17 NOTE — Patient Instructions (Signed)
  1. Continue to Perform Allergen avoidance measures - dust mite, pollens, molds  2. Continue to Treat and prevent inflammation:   A. Breztri - 2 inhalations 1-2 times per day with spacer  B. Flonase - 1-2 sprays each nostril one time per day  3. If needed:   A. Proventil or Ventolin HFA 2 puffs every 4-6 hours  B. OTC antihistamine - Claritin/Zyrtec/Allegra  C. nasal ipratropium 0.06% 2 sprays each nostril every 6 hours  D. Patanol - 1 drop each eye 1-2 times per day  4. "Action plan" for asthma flare up:   A. continue Breztri  B. add Flovent 110 3 inhalations 3 times a day with spacer  C. use Proventil or Ventolin HFA if needed  5. Continue treatment for reflux with Protonix 40 mg 2 times a day and vist with GI doctor  6. Obtain fall flu vaccine  7. Return to clinic in 6 months or earlier if problem

## 2020-01-17 NOTE — Telephone Encounter (Signed)
Left message on machine for patient okay to do virtual or in office

## 2020-01-17 NOTE — Progress Notes (Signed)
Richard Ho    [id number]    Nov 05, 1940  Primary Care Physician:Hernandez Priscella Mann, MD  Referring Physician: Philip Aspen, Limmie Patricia, MD [address] [place],  [place] [address]   Chief complaint: Dysphagia HPI:  79 year old very pleasant gentleman here for new patient visit with complaints of dysphagia to solid food.  He had an episode of transient food impaction in June where he felt a piece of chicken stuck in his throat, it eventually passed but he had discomfort and heartburn for few days after that.  Since then he has been very careful eating his food.  He continues to have intermittent spasms in his lower esophagus. Has intermittent difficulty with swallowing liquids specially cold beverages  He has new primary care provider at [facility] and is helping him get new dentures, he has been trying to get them for some time now  Denies any change in bowel habits, unintentional weight loss, abdominal pain, melena or blood per rectum.  He has had colonoscopy through [facility], is up-to-date with colorectal cancer screening .  Last colonoscopy report is not available to review during this visit                                                                                                                                                                                                                                                        Outpatient Encounter Medications as of 01/17/2020  Medication Sig  . Albuterol (PROVENTIL IN) Inhale into the lungs.  . ALPRAZolam (XANAX) 0.25 MG tablet TAKE 1 TABLET BY MOUTH AT BEDTIME AS NEEDED FOR SLEEP  . aspirin EC 81 MG tablet Take 81 mg by mouth daily.  Marland Kitchen atorvastatin (LIPITOR) 40 MG tablet Take 1 tablet (40 mg total) by mouth daily.  . Budeson-Glycopyrrol-Formoterol (BREZTRI AEROSPHERE) 160-9-4.8 MCG/ACT AERO 2 puffs 1-2 times per day with spacer.  . carvedilol (COREG) 6.25 MG tablet Take 1 tablet (6.25 mg  total) by mouth 2 (two) times daily with a meal.  . Cholecalciferol (VITAMIN D3 PO) Take 1 tablet by mouth daily.  . Cyanocobalamin (B-12 PO) Take 1 tablet by mouth daily.  . ferrous sulfate 325 (65 FE) MG EC tablet Take 325 mg by mouth daily.   . fluticasone (  FLONASE) 50 MCG/ACT nasal spray USE 1 TO 2 SPRAY(S) IN EACH NOSTRIL ONCE DAILY AS DIRECTED  . furosemide (LASIX) 20 MG tablet Take 20 mg by mouth.  Marland Kitchen ipratropium (ATROVENT) 0.06 % nasal spray 2 sprays each nostril every 6 hours.  . isosorbide mononitrate (IMDUR) 30 MG 24 hr tablet TAKE ONE TABLET BY MOUTH TWICE DAILY  . levothyroxine (SYNTHROID) 100 MCG tablet Take 1 tablet (100 mcg total) by mouth daily.  . metFORMIN (GLUCOPHAGE) 500 MG tablet TAKE 1 TABLET BY MOUTH TWICE DAILY WITH A MEAL  . nitroGLYCERIN (NITROSTAT) 0.4 MG SL tablet Place 1 tablet (0.4 mg total) under the tongue every 5 (five) minutes x 3 doses as needed for chest pain.  Marland Kitchen ondansetron (ZOFRAN) 8 MG tablet Take 8 mg by mouth every 8 (eight) hours as needed for nausea or vomiting.  . pantoprazole (PROTONIX) 40 MG tablet Take 1 tablet (40 mg total) by mouth daily. (Patient taking differently: Take 40 mg by mouth 2 (two) times daily. )  . pregabalin (LYRICA) 75 MG capsule Take 1 capsule (75 mg total) by mouth 2 (two) times daily.  . tamsulosin (FLOMAX) 0.4 MG CAPS capsule TAKE ONE CAPSULE BY MOUTH DAILY  . [DISCONTINUED] fluticasone (FLOVENT HFA) 110 MCG/ACT inhaler Inhale three puffs three times daily during flare-up.  Rinse, gargle, and spit after use.  . [DISCONTINUED] fluticasone furoate-vilanterol (BREO ELLIPTA) 200-25 MCG/INH AEPB Inhale 1 puff into the lungs daily. Rinse, gargle, and spit after use.   No facility-administered encounter medications on file as of 01/17/2020.    Allergies as of 01/17/2020 - Review Complete 01/17/2020  Allergen Reaction Noted  . Trazodone and nefazodone  07/01/2019  . Meloxicam Other (See Comments) 03/18/2011    Past Medical  History:  Diagnosis Date  . Anemia   . Asthma   . CKD (chronic kidney disease), stage III   . Coronary artery disease 1991   a) Angioplasty LAD 1991 [place] b) MI in 1995 at  [facility] in [place], med Rx c) Cath 10/2019 for NSTEMI - see report -> med rx.  . Diabetes mellitus   . GERD (gastroesophageal reflux disease)   . Hearing loss   . HTN (hypertension)   . Hyperlipidemia   . Hypothyroidism   . Myocardial infarct (HCC)   . Nephrolithiasis   . Osteopenia   . Premature atrial contractions   . PVC's (premature ventricular contractions)   . Snake bite     Past Surgical History:  Procedure Laterality Date  . ANGIOPLASTY  1991  . CARDIAC CATHETERIZATION  2005   Moderate LCx and LAD disease and severe diffuse RCA disease  . CARDIAC CATHETERIZATION  01/2012   normal left main, 50% pLAD stenosis, dLAD mild luminal irregularities, D1 30-40% stenosis at take off; subtotally occluded/severely diseased OM1, mid 80% stenosis in small OM2, long prox and mid RCA stenoses ranging from 60-95%, severely diseased PDA, distal PLSA occluded filling via L-R collateral; normal LV function; inferior lateral basal akinesis.  . CHOLECYSTECTOMY  2012  . EYE SURGERY  2010   cataract - right, has left done too  . INGUINAL HERNIA REPAIR    . INTRAVASCULAR PRESSURE WIRE/FFR STUDY N/A 10/10/2019   Procedure: INTRAVASCULAR PRESSURE WIRE/FFR STUDY;  Surgeon: Runell Gess, MD;  [facility];  Service: Cardiovascular;  Laterality: N/A;  . LEFT HEART CATH AND CORONARY ANGIOGRAPHY N/A 10/10/2019   Procedure: LEFT HEART CATH AND CORONARY ANGIOGRAPHY;  Surgeon: Runell Gess, MD;  [facility];  Service: Cardiovascular;  Laterality: N/A;  . LEFT HEART CATHETERIZATION WITH CORONARY ANGIOGRAM N/A 01/28/2012   Procedure: LEFT HEART CATHETERIZATION WITH CORONARY ANGIOGRAM;  Surgeon: Iran Ouch, MD;  [facility]: [facility];  Service: Cardiovascular;  Laterality: N/A;  .  LEFT HEART CATHETERIZATION WITH CORONARY ANGIOGRAM N/A 03/17/2014   Procedure: LEFT HEART CATHETERIZATION WITH CORONARY ANGIOGRAM;  Surgeon: Kathleene Hazel, MD;  Location: [facility];  Service: Cardiovascular;  Laterality: N/A;  . ORIF TIBIA & FIBULA FRACTURES    . PTCA    . SHOULDER ARTHROSCOPY WITH DISTAL CLAVICLE RESECTION Left 12/16/2018   Procedure: LEFT SHOULDER ARTHROSCOPY WITH DISTAL CLAVICLE RESECTION;  Surgeon: Bjorn Pippin, MD;  Location: [facility];  Service: Orthopedics;  Laterality: Left;  . SHOULDER ARTHROSCOPY WITH ROTATOR CUFF REPAIR AND SUBACROMIAL DECOMPRESSION Left 12/16/2018   Procedure: LEFT SHOULDER ARTHROSCOPY WITH ROTATOR CUFF REPAIR AND SUBACROMIAL DECOMPRESSION, DEBRIDEMENT;  Surgeon: Bjorn Pippin, MD;  Location: [facility];  Service: Orthopedics;  Laterality: Left;  . SHOULDER ARTHROSCOPY WITH SUBACROMIAL DECOMPRESSION AND BICEP TENDON REPAIR Left 12/16/2018   Procedure: SHOULDER ARTHROSCOPY WITH SUBACROMIAL DECOMPRESSION AND BICEP TENDON REPAIR;  Surgeon: Bjorn Pippin, MD;  Location: [facility];  Service: Orthopedics;  Laterality: Left;  Marland Kitchen VASECTOMY      Family History  Problem Relation Age of Onset  . Asthma Mother   . Allergies Sister   . Coronary artery disease Other   . Colon cancer Neg Hx   . Esophageal cancer Neg Hx     Social History   Socioeconomic History  . Marital status: Married    Spouse name: Not on file  . Number of children: Not on file  . Years of education: Not on file  . Highest education level: Not on file  Occupational History  . Occupation: Retired  Tobacco Use  . Smoking status: Never Smoker  . Smokeless tobacco: Never Used  Vaping Use  . Vaping Use: Never used  Substance and Sexual Activity  . Alcohol use: Yes    Alcohol/week: 0.0 standard drinks    Comment: occ, once every 2 months  . Drug use: No  . Sexual activity: Not on file  Other Topics Concern  . Not on file   Social History Narrative  . Not on file   Social Determinants of Health   Financial Resource Strain:   . Difficulty of Paying Living Expenses:   Food Insecurity:   . Worried About Programme researcher, broadcasting/film/video in the Last Year:   . Barista in the Last Year:   Transportation Needs:   . Freight forwarder (Medical):   Marland Kitchen Lack of Transportation (Non-Medical):   Physical Activity:   . Days of Exercise per Week:   . Minutes of Exercise per Session:   Stress:   . Feeling of Stress :   Social Connections:   . Frequency of Communication with Friends and Family:   . Frequency of Social Gatherings with Friends and Family:   . Attends Religious Services:   . Active Member of Clubs or Organizations:   . Attends Banker Meetings:   Marland Kitchen Marital Status:   Intimate Partner Violence:   . Fear of Current or Ex-Partner:   . Emotionally Abused:   Marland Kitchen Physically Abused:   . Sexually Abused:       Review of systems: All other review of systems negative except as mentioned in the  HPI.   Physical Exam: Vitals:   01/17/20 1446  BP: 124/60  Pulse: 80   Body mass index is 30.68 kg/m. Gen:      No acute distress HEENT:  sclera anicteric Abd:      soft, non-tender; no palpable masses, no distension Ext:    No edema Neuro: alert and oriented x 3 Psych: normal mood and affect  Data Reviewed:  Reviewed labs, radiology imaging, old records and pertinent past GI work up   Assessment and Plan/Recommendations:  79 year old very pleasant gentleman with history of type 2 diabetes, hypothyroidism, hypertension, hyperlipidemia, CHF here for for evaluation of solid dysphagia  Schedule for EGD for further evaluation, exclude peptic stricture or neoplastic lesion Possible esophageal dilation if needed  The risks and benefits as well as alternatives of endoscopic procedure(s) have been discussed and reviewed. All questions answered. The patient agrees to proceed.  Advised patient  to eat soft diet until EGD to prevent any episodes of food impaction  Antireflux measures Continue Protonix for GERD  Return after EGD as needed   The patient was provided an opportunity to ask questions and all were answered. The patient agreed with the plan and demonstrated an understanding of the instructions.  Iona BeardK. Veena Berkley Cronkright , MD    CC: Philip AspenHernandez Acosta, [place]*

## 2020-01-17 NOTE — Patient Instructions (Signed)
You have been scheduled for an endoscopy. Please follow written instructions given to you at your visit today. If you use inhalers (even only as needed), please bring them with you on the day of your procedure.   If you are age 79 or older, your body mass index should be between 23-30. Your Body mass index is 30.68 kg/m. If this is out of the aforementioned range listed, please consider follow up with your Primary Care Provider.  If you are age 50 or younger, your body mass index should be between 19-25. Your Body mass index is 30.68 kg/m. If this is out of the aformentioned range listed, please consider follow up with your Primary Care Provider.    Due to recent changes in healthcare laws, you may see the results of your imaging and laboratory studies on MyChart before your provider has had a chance to review them.  We understand that in some cases there may be results that are confusing or concerning to you. Not all laboratory results come back in the same time frame and the provider may be waiting for multiple results in order to interpret others.  Please give [place] 48 hours in order for your provider to thoroughly review all the results before contacting the office for clarification of your results.   Continue ASA 81 mg daily  Eat a soft diet until after Endoscopy   Gastroesophageal Reflux Disease, Adult Gastroesophageal reflux (GER) happens when acid from the stomach flows up into the tube that connects the mouth and the stomach (esophagus). Normally, food travels down the esophagus and stays in the stomach to be digested. However, when a person has GER, food and stomach acid sometimes move back up into the esophagus. If this becomes a more serious problem, the person may be diagnosed with a disease called gastroesophageal reflux disease (GERD). GERD occurs when the reflux:  Happens often.  Causes frequent or severe symptoms.  Causes problems such as damage to the esophagus. When stomach  acid comes in contact with the esophagus, the acid may cause soreness (inflammation) in the esophagus. Over time, GERD may create small holes (ulcers) in the lining of the esophagus. What are the causes? This condition is caused by a problem with the muscle between the esophagus and the stomach (lower esophageal sphincter, or LES). Normally, the LES muscle closes after food passes through the esophagus to the stomach. When the LES is weakened or abnormal, it does not close properly, and that allows food and stomach acid to go back up into the esophagus. The LES can be weakened by certain dietary substances, medicines, and medical conditions, including:  Tobacco use.  Pregnancy.  Having a hiatal hernia.  Alcohol use.  Certain foods and beverages, such as coffee, chocolate, onions, and peppermint. What increases the risk? You are more likely to develop this condition if you:  Have an increased body weight.  Have a connective tissue disorder.  Use NSAID medicines. What are the signs or symptoms? Symptoms of this condition include:  Heartburn.  Difficult or painful swallowing.  The feeling of having a lump in the throat.  Abitter taste in the mouth.  Bad breath.  Having a large amount of saliva.  Having an upset or bloated stomach.  Belching.  Chest pain. Different conditions can cause chest pain. Make sure you see your health care provider if you experience chest pain.  Shortness of breath or wheezing.  Ongoing (chronic) cough or a night-time cough.  Wearing away of tooth  enamel.  Weight loss. How is this diagnosed? Your health care provider will take a medical history and perform a physical exam. To determine if you have mild or severe GERD, your health care provider may also monitor how you respond to treatment. You may also have tests, including:  A test to examine your stomach and esophagus with a small camera (endoscopy).  A test thatmeasures the acidity  level in your esophagus.  A test thatmeasures how much pressure is on your esophagus.  A barium swallow or modified barium swallow test to show the shape, size, and functioning of your esophagus. How is this treated? The goal of treatment is to help relieve your symptoms and to prevent complications. Treatment for this condition may vary depending on how severe your symptoms are. Your health care provider may recommend:  Changes to your diet.  Medicine.  Surgery. Follow these instructions at home: Eating and drinking   Follow a diet as recommended by your health care provider. This may involve avoiding foods and drinks such as: ? Coffee and tea (with or without caffeine). ? Drinks that containalcohol. ? Energy drinks and sports drinks. ? Carbonated drinks or sodas. ? Chocolate and cocoa. ? Peppermint and mint flavorings. ? Garlic and onions. ? Horseradish. ? Spicy and acidic foods, including peppers, chili powder, curry powder, vinegar, hot sauces, and barbecue sauce. ? Citrus fruit juices and citrus fruits, such as oranges, lemons, and limes. ? Tomato-based foods, such as red sauce, chili, salsa, and pizza with red sauce. ? Fried and fatty foods, such as donuts, french fries, potato chips, and high-fat dressings. ? High-fat meats, such as hot dogs and fatty cuts of red and white meats, such as rib eye steak, sausage, ham, and bacon. ? High-fat dairy items, such as whole milk, butter, and cream cheese.  Eat small, frequent meals instead of large meals.  Avoid drinking large amounts of liquid with your meals.  Avoid eating meals during the 2-3 hours before bedtime.  Avoid lying down right after you eat.  Do not exercise right after you eat. Lifestyle   Do not use any products that contain nicotine or tobacco, such as cigarettes, e-cigarettes, and chewing tobacco. If you need help quitting, ask your health care provider.  Try to reduce your stress by using methods such  as yoga or meditation. If you need help reducing stress, ask your health care provider.  If you are overweight, reduce your weight to an amount that is healthy for you. Ask your health care provider for guidance about a safe weight loss goal. General instructions  Pay attention to any changes in your symptoms.  Take over-the-counter and prescription medicines only as told by your health care provider. Do not take aspirin, ibuprofen, or other NSAIDs unless your health care provider told you to do so.  Wear loose-fitting clothing. Do not wear anything tight around your waist that causes pressure on your abdomen.  Raise (elevate) the head of your bed about 6 inches (15 cm).  Avoid bending over if this makes your symptoms worse.  Keep all follow-up visits as told by your health care provider. This is important. Contact a health care provider if:  You have: ? New symptoms. ? Unexplained weight loss. ? Difficulty swallowing or it hurts to swallow. ? Wheezing or a persistent cough. ? A hoarse voice.  Your symptoms do not improve with treatment. Get help right away if you:  Have pain in your arms, neck, jaw, teeth, or back.  Feel sweaty, dizzy, or light-headed.  Have chest pain or shortness of breath.  Vomit and your vomit looks like blood or coffee grounds.  Faint.  Have stool that is bloody or black.  Cannot swallow, drink, or eat. Summary  Gastroesophageal reflux happens when acid from the stomach flows up into the esophagus. GERD is a disease in which the reflux happens often, causes frequent or severe symptoms, or causes problems such as damage to the esophagus.  Treatment for this condition may vary depending on how severe your symptoms are. Your health care provider may recommend diet and lifestyle changes, medicine, or surgery.  Contact a health care provider if you have new or worsening symptoms.  Take over-the-counter and prescription medicines only as told by your  health care provider. Do not take aspirin, ibuprofen, or other NSAIDs unless your health care provider told you to do so.  Keep all follow-up visits as told by your health care provider. This is important. This information is not intended to replace advice given to you by your health care provider. Make sure you discuss any questions you have with your health care provider. Document Revised: 12/02/2017 Document Reviewed: 12/02/2017 Elsevier Patient Education  2020 [employer].  I appreciate the  opportunity to care for you  Thank You   Marsa Aris , MD

## 2020-01-17 NOTE — Telephone Encounter (Signed)
Pt called back to check on this. Informed pt that this visit was never a virtual visit otherwise it would show it was changed. It was originally an office visit. Pt stated he will be here tomorrow.

## 2020-01-18 ENCOUNTER — Encounter: Payer: Self-pay | Admitting: Gastroenterology

## 2020-01-18 ENCOUNTER — Encounter: Payer: Self-pay | Admitting: Internal Medicine

## 2020-01-18 ENCOUNTER — Ambulatory Visit (INDEPENDENT_AMBULATORY_CARE_PROVIDER_SITE_OTHER): Payer: Medicare Other | Admitting: Internal Medicine

## 2020-01-18 ENCOUNTER — Encounter: Payer: Self-pay | Admitting: Allergy and Immunology

## 2020-01-18 VITALS — BP 102/70 | HR 73 | Temp 98.2°F | Wt 215.1 lb

## 2020-01-18 DIAGNOSIS — E1129 Type 2 diabetes mellitus with other diabetic kidney complication: Secondary | ICD-10-CM

## 2020-01-18 DIAGNOSIS — E039 Hypothyroidism, unspecified: Secondary | ICD-10-CM

## 2020-01-18 DIAGNOSIS — I214 Non-ST elevation (NSTEMI) myocardial infarction: Secondary | ICD-10-CM

## 2020-01-18 DIAGNOSIS — I10 Essential (primary) hypertension: Secondary | ICD-10-CM

## 2020-01-18 LAB — POCT GLYCOSYLATED HEMOGLOBIN (HGB A1C): Hemoglobin A1C: 6.1 % — AB (ref 4.0–5.6)

## 2020-01-18 LAB — TSH: TSH: 3.37 mIU/L (ref 0.40–4.50)

## 2020-01-18 MED ORDER — CARVEDILOL 6.25 MG PO TABS: 6.2500 mg | ORAL_TABLET | Freq: Two times a day (BID) | ORAL | 1 refills | Status: AC

## 2020-01-18 NOTE — Progress Notes (Signed)
Established Patient Office Visit     This visit occurred during the SARS-CoV-2 public health emergency.  Safety protocols were in place, including screening questions prior to the visit, additional usage of staff PPE, and extensive cleaning of exam room while observing appropriate contact time as indicated for disinfecting solutions.    CC/Reason for Visit: Follow-up chronic conditions  HPI: Richard Ho is a 79 y.o. male who is coming in today for the above mentioned reasons. Past Medical History is significant for:   1. Type 2 diabetes, his last A1c was 7.3 in May  2. Hypertension this has been well controlled at home.  3. Diabetic neuropathy, pain is improved since starting Lyrica last visit, however he has had increasing numbness and issues with balance.  4. Hypothyroidism/TSH was 4.590 in May, levothyroxine dose was increased from 75 to 100 mcg in response to that.  5. Asthma this appears to be moderately well controlled on ipratropium and as needed albuterol, he tells me he has been seeing an allergist.  6. Left retinal detachment in 2019, followed by ophthalmology, still has some visual floaters but he is overall happy with the results of his surgery.  7. Coronary artery diseas status post non-ST elevated MI in May of this year.  8. Hyperlipidemia, last LDL was 56 in February.  He has been doing well since we last spoke, has been able to drop 14 pounds.  He has had to double the dose of his PPI in response to reflux.  He is scheduled for endoscopy next week.   Past Medical/Surgical History: Past Medical History:  Diagnosis Date  . Anemia   . Asthma   . CKD (chronic kidney disease), stage III   . Coronary artery disease 1991   a) Angioplasty LAD 1991 [place] b) MI in 1995 at  [facility] in [place], med Rx c) Cath 10/2019 for NSTEMI - see report -> med rx.  . Diabetes mellitus   . GERD (gastroesophageal reflux disease)   . Hearing loss    . HTN (hypertension)   . Hyperlipidemia   . Hypothyroidism   . Myocardial infarct (HCC)   . Nephrolithiasis   . Osteopenia   . Premature atrial contractions   . PVC's (premature ventricular contractions)   . Snake bite     Past Surgical History:  Procedure Laterality Date  . ANGIOPLASTY  1991  . CARDIAC CATHETERIZATION  2005   Moderate LCx and LAD disease and severe diffuse RCA disease  . CARDIAC CATHETERIZATION  01/2012   normal left main, 50% pLAD stenosis, dLAD mild luminal irregularities, D1 30-40% stenosis at take off; subtotally occluded/severely diseased OM1, mid 80% stenosis in small OM2, long prox and mid RCA stenoses ranging from 60-95%, severely diseased PDA, distal PLSA occluded filling via L-R collateral; normal LV function; inferior lateral basal akinesis.  . CHOLECYSTECTOMY  2012  . EYE SURGERY  2010   cataract - right, has left done too  . INGUINAL HERNIA REPAIR    . INTRAVASCULAR PRESSURE WIRE/FFR STUDY N/A 10/10/2019   Procedure: INTRAVASCULAR PRESSURE WIRE/FFR STUDY;  Surgeon: Runell Gess, MD;  Location: [facility];  Service: Cardiovascular;  Laterality: N/A;  . LEFT HEART CATH AND CORONARY ANGIOGRAPHY N/A 10/10/2019   Procedure: LEFT HEART CATH AND CORONARY ANGIOGRAPHY;  Surgeon: Runell Gess, MD;  Location: [facility];  Service: Cardiovascular;  Laterality: N/A;  . LEFT HEART CATHETERIZATION WITH CORONARY ANGIOGRAM N/A 01/28/2012   Procedure:  LEFT HEART CATHETERIZATION WITH CORONARY ANGIOGRAM;  Surgeon: Iran Ouch, MD;  Location: [facility] CATH LAB;  Service: Cardiovascular;  Laterality: N/A;  . LEFT HEART CATHETERIZATION WITH CORONARY ANGIOGRAM N/A 03/17/2014   Procedure: LEFT HEART CATHETERIZATION WITH CORONARY ANGIOGRAM;  Surgeon: Kathleene Hazel, MD;  Location: [facility] CATH LAB;  Service: Cardiovascular;  Laterality: N/A;  . ORIF TIBIA & FIBULA FRACTURES    . PTCA    . SHOULDER ARTHROSCOPY WITH DISTAL CLAVICLE RESECTION Left 12/16/2018    Procedure: LEFT SHOULDER ARTHROSCOPY WITH DISTAL CLAVICLE RESECTION;  Surgeon: Bjorn Pippin, MD;  Location: [facility];  Service: Orthopedics;  Laterality: Left;  . SHOULDER ARTHROSCOPY WITH ROTATOR CUFF REPAIR AND SUBACROMIAL DECOMPRESSION Left 12/16/2018   Procedure: LEFT SHOULDER ARTHROSCOPY WITH ROTATOR CUFF REPAIR AND SUBACROMIAL DECOMPRESSION, DEBRIDEMENT;  Surgeon: Bjorn Pippin, MD;  Location: [facility];  Service: Orthopedics;  Laterality: Left;  . SHOULDER ARTHROSCOPY WITH SUBACROMIAL DECOMPRESSION AND BICEP TENDON REPAIR Left 12/16/2018   Procedure: SHOULDER ARTHROSCOPY WITH SUBACROMIAL DECOMPRESSION AND BICEP TENDON REPAIR;  Surgeon: Bjorn Pippin, MD;  Location:  SURGERY CENTER;  Service: Orthopedics;  Laterality: Left;  Marland Kitchen VASECTOMY      Social History:  reports that he has never smoked. He has never used smokeless tobacco. He reports current alcohol use. He reports that he does not use drugs.  Allergies: Allergies  Allergen Reactions  . Trazodone And Nefazodone     "makes me freak out"  . Meloxicam Other (See Comments)    "jumping out of my skin" Pt feels anxiety attacks when on this medicine    Family History:  Family History  Problem Relation Age of Onset  . Asthma Mother   . Allergies Sister   . Coronary artery disease Other   . Colon cancer Neg Hx   . Esophageal cancer Neg Hx      Current Outpatient Medications:  .  Albuterol (PROVENTIL IN), Inhale into the lungs., Disp: , Rfl:  .  ALPRAZolam (XANAX) 0.25 MG tablet, TAKE 1 TABLET BY MOUTH AT BEDTIME AS NEEDED FOR SLEEP, Disp: 20 tablet, Rfl: 0 .  aspirin EC 81 MG tablet, Take 81 mg by mouth daily., Disp: , Rfl:  .  atorvastatin (LIPITOR) 40 MG tablet, Take 1 tablet (40 mg total) by mouth daily., Disp: 90 tablet, Rfl: 3 .  Budeson-Glycopyrrol-Formoterol (BREZTRI AEROSPHERE) 160-9-4.8 MCG/ACT AERO, 2 puffs 1-2 times per day with spacer., Disp: 10.7 g, Rfl: 5 .  carvedilol  (COREG) 6.25 MG tablet, Take 1 tablet (6.25 mg total) by mouth 2 (two) times daily with a meal., Disp: 180 tablet, Rfl: 1 .  Cholecalciferol (VITAMIN D3 PO), Take 1 tablet by mouth daily., Disp: , Rfl:  .  Cyanocobalamin (B-12 PO), Take 1 tablet by mouth daily., Disp: , Rfl:  .  ferrous sulfate 325 (65 FE) MG EC tablet, Take 325 mg by mouth daily. , Disp: , Rfl:  .  fluticasone (FLONASE) 50 MCG/ACT nasal spray, USE 1 TO 2 SPRAY(S) IN EACH NOSTRIL ONCE DAILY AS DIRECTED, Disp: 48 g, Rfl: 0 .  furosemide (LASIX) 20 MG tablet, Take 20 mg by mouth., Disp: , Rfl:  .  ipratropium (ATROVENT) 0.06 % nasal spray, 2 sprays each nostril every 6 hours., Disp: 15 mL, Rfl: 5 .  isosorbide mononitrate (IMDUR) 30 MG 24 hr tablet, TAKE ONE TABLET BY MOUTH TWICE DAILY, Disp: 180 tablet, Rfl: 3 .  levothyroxine (SYNTHROID) 100 MCG tablet, Take 1 tablet (100 mcg  total) by mouth daily., Disp: 90 tablet, Rfl: 1 .  metFORMIN (GLUCOPHAGE) 500 MG tablet, TAKE 1 TABLET BY MOUTH TWICE DAILY WITH A MEAL, Disp: 180 tablet, Rfl: 0 .  nitroGLYCERIN (NITROSTAT) 0.4 MG SL tablet, Place 1 tablet (0.4 mg total) under the tongue every 5 (five) minutes x 3 doses as needed for chest pain., Disp: 25 tablet, Rfl: 3 .  ondansetron (ZOFRAN) 8 MG tablet, Take 8 mg by mouth every 8 (eight) hours as needed for nausea or vomiting., Disp: , Rfl:  .  pantoprazole (PROTONIX) 40 MG tablet, Take 1 tablet (40 mg total) by mouth daily. (Patient taking differently: Take 40 mg by mouth 2 (two) times daily. ), Disp: 90 tablet, Rfl: 1 .  pregabalin (LYRICA) 75 MG capsule, Take 1 capsule (75 mg total) by mouth 2 (two) times daily., Disp: 180 capsule, Rfl: 1 .  tamsulosin (FLOMAX) 0.4 MG CAPS capsule, TAKE ONE CAPSULE BY MOUTH DAILY, Disp: 30 capsule, Rfl: 5  Review of Systems:  Constitutional: Denies fever, chills, diaphoresis, appetite change and fatigue.  HEENT: Denies photophobia, eye pain, redness, hearing loss, ear pain, congestion, sore throat,  rhinorrhea, sneezing, mouth sores, trouble swallowing, neck pain, neck stiffness and tinnitus.   Respiratory: Denies SOB, DOE, cough, chest tightness,  and wheezing.   Cardiovascular: Denies chest pain, palpitations and leg swelling.  Gastrointestinal: Denies nausea, vomiting, abdominal pain, diarrhea, constipation, blood in stool and abdominal distention.  Genitourinary: Denies dysuria, urgency, frequency, hematuria, flank pain and difficulty urinating.  Endocrine: Denies: hot or cold intolerance, sweats, changes in hair or nails, polyuria, polydipsia. Musculoskeletal: Denies myalgias, back pain, joint swelling, arthralgias and gait problem.  Skin: Denies pallor, rash and wound.  Neurological: Denies dizziness, seizures, syncope, weakness, light-headedness, numbness and headaches.  Hematological: Denies adenopathy. Easy bruising, personal or family bleeding history  Psychiatric/Behavioral: Denies suicidal ideation, mood changes, confusion, nervousness, sleep disturbance and agitation    Physical Exam: Vitals:   01/18/20 0750  BP: 102/70  Pulse: 73  Temp: 98.2 F (36.8 C)  TempSrc: Oral  SpO2: 95%  Weight: 215 lb 1.6 oz (97.6 kg)    Body mass index is 30.86 kg/m.   Constitutional: NAD, calm, comfortable Eyes: PERRL, lids and conjunctivae normal, wears corrective lenses ENMT: Mucous membranes are moist.  Respiratory: clear to auscultation bilaterally, no wheezing, no crackles. Normal respiratory effort. No accessory muscle use.  Cardiovascular: Regular rate and rhythm, no murmurs / rubs / gallops. No extremity edema.  Neurologic: Grossly intact and nonfocal Psychiatric: Normal judgment and insight. Alert and oriented x 3. Normal mood.    Impression and Plan:  Type 2 diabetes mellitus with other kidney complication, unspecified whether long term insulin use (HCC) -Significant improvement with A1c down to 6.1 today.  Acquired hypothyroidism  -Check TSH on increased  levothyroxine dose.  Essential hypertension -Well-controlled on current regimen.  NSTEMI (non-ST elevated myocardial infarction) (HCC) -In May 2021, followed by cardiology, stable.  Morbid obesity (HCC) -Discussed healthy lifestyle, including increased physical activity and better food choices to promote weight loss. -He has been congratulated on his weight loss thus far.      Chaya Jan, MD [facility]

## 2020-01-18 NOTE — Telephone Encounter (Signed)
PA is pending for Breztri. 

## 2020-01-19 NOTE — Telephone Encounter (Signed)
PA approved and pharmacy has been notified.

## 2020-01-23 ENCOUNTER — Encounter: Payer: Self-pay | Admitting: Gastroenterology

## 2020-01-23 ENCOUNTER — Ambulatory Visit (AMBULATORY_SURGERY_CENTER): Payer: Medicare Other | Admitting: Gastroenterology

## 2020-01-23 ENCOUNTER — Other Ambulatory Visit: Payer: Self-pay

## 2020-01-23 VITALS — BP 135/85 | HR 51 | Temp 98.0°F | Resp 17 | Ht 70.0 in | Wt 213.0 lb

## 2020-01-23 DIAGNOSIS — I129 Hypertensive chronic kidney disease with stage 1 through stage 4 chronic kidney disease, or unspecified chronic kidney disease: Secondary | ICD-10-CM | POA: Diagnosis not present

## 2020-01-23 DIAGNOSIS — K222 Esophageal obstruction: Secondary | ICD-10-CM

## 2020-01-23 DIAGNOSIS — K2100 Gastro-esophageal reflux disease with esophagitis, without bleeding: Secondary | ICD-10-CM

## 2020-01-23 DIAGNOSIS — R1310 Dysphagia, unspecified: Secondary | ICD-10-CM | POA: Diagnosis not present

## 2020-01-23 DIAGNOSIS — K219 Gastro-esophageal reflux disease without esophagitis: Secondary | ICD-10-CM

## 2020-01-23 DIAGNOSIS — J449 Chronic obstructive pulmonary disease, unspecified: Secondary | ICD-10-CM | POA: Diagnosis not present

## 2020-01-23 MED ORDER — SODIUM CHLORIDE 0.9 % IV SOLN
500.0000 mL | Freq: Once | INTRAVENOUS | Status: DC
Start: 2020-01-23 — End: 2020-01-23

## 2020-01-23 NOTE — Progress Notes (Signed)
Called to room to assist during endoscopic procedure.  Patient ID and intended procedure confirmed with present staff. Received instructions for my participation in the procedure from the performing physician.  

## 2020-01-23 NOTE — Op Note (Signed)
[facility] Patient Name: Richard Ho Procedure Date: 01/23/2020 4:38 PM MRN: [id number] Endoscopist: Napoleon Form , MD Age: 79 Referring MD:  Date of Birth: 10-23-1940 Gender: Male Account #: [id number] Procedure:                Upper GI endoscopy Indications:              Dysphagia, Gastro-esophageal reflux disease Medicines:                Monitored Anesthesia Care Procedure:                Pre-Anesthesia Assessment:                           - Prior to the procedure, a History and Physical                            was performed, and patient medications and                            allergies were reviewed. The patient's tolerance of                            previous anesthesia was also reviewed. The risks                            and benefits of the procedure and the sedation                            options and risks were discussed with the patient.                            All questions were answered, and informed consent                            was obtained. Prior Anticoagulants: The patient has                            taken no previous anticoagulant or antiplatelet                            agents. ASA Grade Assessment: III - A patient with                            severe systemic disease. After reviewing the risks                            and benefits, the patient was deemed in                            satisfactory condition to undergo the procedure.                           After obtaining informed consent, the endoscope was  passed under direct vision. Throughout the                            procedure, the patient's blood pressure, pulse, and                            oxygen saturations were monitored continuously. The                            Endoscope was introduced through the mouth, and                            advanced to the second part of duodenum. The upper                            GI  endoscopy was accomplished without difficulty.                            The patient tolerated the procedure well. Scope In: Scope Out: Findings:                 LA Grade A (one or more mucosal breaks less than 5                            mm, not extending between tops of 2 mucosal folds)                            esophagitis with no bleeding was found 35 to 38 cm                            from the incisors.                           One benign-appearing, intrinsic mild stenosis was                            found 37 to 38 cm from the incisors. This stenosis                            measured 1.5 cm (inner diameter) x less than one cm                            (in length). The stenosis was traversed. A TTS                            dilator was passed through the scope. Dilation with                            a 16-17-18 mm balloon dilator was performed to 18                            mm. The dilation site was examined following  endoscope reinsertion and showed no change.                           The lumen of the esophagus was mildly dilated.                           No gross lesions were noted in the entire examined                            stomach.                           The examined duodenum was normal. Complications:            No immediate complications. Estimated Blood Loss:     Estimated blood loss was minimal. Impression:               - LA Grade A reflux esophagitis with no bleeding.                           - Benign-appearing esophageal stenosis. Dilated.                           - Dilation in the entire esophagus.                           - No gross lesions in the stomach.                           - Normal examined duodenum.                           - No specimens collected. Recommendation:           - Patient has a contact number available for                            emergencies. The signs and symptoms of potential                             delayed complications were discussed with the                            patient. Return to normal activities tomorrow.                            Written discharge instructions were provided to the                            patient.                           - Resume previous diet.                           - Continue present medications.                           -  Await pathology results. Napoleon FormKavitha V. Yumiko Alkins, MD 01/23/2020 5:06:46 PM This report has been signed electronically.

## 2020-01-23 NOTE — Progress Notes (Signed)
Report to PACU, RN, vss, BBS= Clear.  

## 2020-01-23 NOTE — Patient Instructions (Signed)
Handout provided on esophagitis and stricture.   YOU HAD AN ENDOSCOPIC PROCEDURE TODAY AT THE [facility]:   Refer to the procedure report that was given to you for any specific questions about what was found during the examination.  If the procedure report does not answer your questions, please call your gastroenterologist to clarify.  If you requested that your care partner not be given the details of your procedure findings, then the procedure report has been included in a sealed envelope for you to review at your convenience later.  YOU SHOULD EXPECT: Some feelings of bloating in the abdomen. Passage of more gas than usual.  Walking can help get rid of the air that was put into your GI tract during the procedure and reduce the bloating. If you had a lower endoscopy (such as a colonoscopy or flexible sigmoidoscopy) you may notice spotting of blood in your stool or on the toilet paper. If you underwent a bowel prep for your procedure, you may not have a normal bowel movement for a few days.  Please Note:  You might notice some irritation and congestion in your nose or some drainage.  This is from the oxygen used during your procedure.  There is no need for concern and it should clear up in a day or so.  SYMPTOMS TO REPORT IMMEDIATELY:   Following upper endoscopy (EGD)  Vomiting of blood or coffee ground material  New chest pain or pain under the shoulder blades  Painful or persistently difficult swallowing  New shortness of breath  Fever of 100F or higher  Black, tarry-looking stools  For urgent or emergent issues, a gastroenterologist can be reached at any hour by calling [phone]. Do not use MyChart messaging for urgent concerns.    DIET:  We do recommend a small meal at first, but then you may proceed to your regular diet.  Drink plenty of fluids but you should avoid alcoholic beverages for 24 hours.  ACTIVITY:  You should plan to take it easy for the rest of today  and you should NOT DRIVE or use heavy machinery until tomorrow (because of the sedation medicines used during the test).    FOLLOW UP: Our staff will call the number listed on your records 48-72 hours following your procedure to check on you and address any questions or concerns that you may have regarding the information given to you following your procedure. If we do not reach you, we will leave a message.  We will attempt to reach you two times.  During this call, we will ask if you have developed any symptoms of COVID 19. If you develop any symptoms (ie: fever, flu-like symptoms, shortness of breath, cough etc.) before then, please call [phone].  If you test positive for Covid 19 in the 2 weeks post procedure, please call and report this information to [place].    If any biopsies were taken you will be contacted by phone or by letter within the next 1-3 weeks.  Please call us at [phone] if you have not heard about the biopsies in 3 weeks.    SIGNATURES/CONFIDENTIALITY: You and/or your care partner have signed paperwork which will be entered into your electronic medical record.  These signatures attest to the fact that that the information above on your After Visit Summary has been reviewed and is understood.  Full responsibility of the confidentiality of this discharge information lies with you and/or your care-partner.

## 2020-01-25 ENCOUNTER — Telehealth: Payer: Self-pay | Admitting: *Deleted

## 2020-01-25 ENCOUNTER — Telehealth: Payer: Self-pay

## 2020-01-25 NOTE — Telephone Encounter (Signed)
  Follow up Call-  Call back number 01/23/2020  Post procedure Call Back phone  # [phone]  Permission to leave phone message Yes  Some recent data might be hidden     Patient questions:  Do you have a fever, pain , or abdominal swelling? No. Pain Score  0 *  Have you tolerated food without any problems? Yes.    Have you been able to return to your normal activities? Yes.    Do you have any questions about your discharge instructions: Diet   No. Medications  No. Follow up visit  No.  Do you have questions or concerns about your Care? No.  Actions: * If pain score is 4 or above: No action needed, pain <4.   1. Have you developed a fever since your procedure? no  2.   Have you had an respiratory symptoms (SOB or cough) since your procedure? no  3.   Have you tested positive for COVID 19 since your procedure no  4.   Have you had any family members/close contacts diagnosed with the COVID 19 since your procedure?  no   If yes to any of these questions please route to Laverna Peace, RN and Karlton Lemon, RN

## 2020-01-25 NOTE — Telephone Encounter (Signed)
No answer, left message to call back later today, B.Stefhanie Kachmar RN. 

## 2020-01-26 ENCOUNTER — Encounter: Payer: Self-pay | Admitting: Internal Medicine

## 2020-02-06 ENCOUNTER — Other Ambulatory Visit: Payer: Self-pay | Admitting: Internal Medicine

## 2020-02-06 DIAGNOSIS — E1142 Type 2 diabetes mellitus with diabetic polyneuropathy: Secondary | ICD-10-CM

## 2020-02-19 NOTE — Progress Notes (Signed)
Chief Complaint  Patient presents with   Follow-up    CAD   History of Present Illness: 79 yo male with history of CAD, HTN, HLD and DM here today for cardiac follow up. He has a history of coronary angioplasty in [place] and in [place] in the 1990s. Last cardiac cath October 2015 with stable moderate CAD. The RCA is diffusely diseased with no focal targets for PCI.  Echo August 2018 with normal LV systolic function, grade 2 diastolic dysfunction and no significant valve disease. He had increased lower extremity edema in April 2021 and Lasix was started with improvement in edema.  Echo April 2021 with LVEF 55-60%, mild LVH, mild MR. Carotid artery dopplers April 2021 with mild bilateral carotid artery disease. He was admitted to [facility] May 2021 with chest pain, troponin 101. Cardiac cath May 2021 with CTO of the RCA, 95% OM stenosis, moderate LAD stenosis. Medical management was recommended. He was seen in our office 10/26/19 by Ronie Spies, PA-C and c/o lightheadedness. 3 day cardiac monitor with frequent PVCs. He has been seen in the EP clinic by Dr. Johney Frame 12/05/19. Plan was to continue the beta blocker.   He is here today for follow up. The patient denies any chest pain, dyspnea, palpitations, lower extremity edema, orthopnea, PND, dizziness, near syncope or syncope. He feels great.    Primary Care Physician: Philip Aspen, Limmie Patricia, MD  Past Medical History:  Diagnosis Date   Anemia    Asthma    Cataract    CKD (chronic kidney disease), stage III    Coronary artery disease 1991   a) Angioplasty LAD 1991 [place] b) MI in 1995 at  [facility] in [place], med Rx c) Cath 10/2019 for NSTEMI - see report -> med rx.   Diabetes mellitus    GERD (gastroesophageal reflux disease)    Hearing loss    HTN (hypertension)    Hyperlipidemia    Hypothyroidism    Myocardial infarct (HCC)    Nephrolithiasis    Osteopenia    Premature atrial contractions    PVC's (premature  ventricular contractions)    Snake bite     Past Surgical History:  Procedure Laterality Date   ANGIOPLASTY  1991   CARDIAC CATHETERIZATION  2005   Moderate LCx and LAD disease and severe diffuse RCA disease   CARDIAC CATHETERIZATION  01/2012   normal left main, 50% pLAD stenosis, dLAD mild luminal irregularities, D1 30-40% stenosis at take off; subtotally occluded/severely diseased OM1, mid 80% stenosis in small OM2, long prox and mid RCA stenoses ranging from 60-95%, severely diseased PDA, distal PLSA occluded filling via L-R collateral; normal LV function; inferior lateral basal akinesis.   CHOLECYSTECTOMY  2012   EYE SURGERY  2010   cataract - right, has left done too   INGUINAL HERNIA REPAIR     INTRAVASCULAR PRESSURE WIRE/FFR STUDY N/A 10/10/2019   Procedure: INTRAVASCULAR PRESSURE WIRE/FFR STUDY;  Surgeon: Runell Gess, MD;  Location: [facility];  Service: Cardiovascular;  Laterality: N/A;   LEFT HEART CATH AND CORONARY ANGIOGRAPHY N/A 10/10/2019   Procedure: LEFT HEART CATH AND CORONARY ANGIOGRAPHY;  Surgeon: Runell Gess, MD;  Location: [facility] CV [facility];  Service: Cardiovascular;  Laterality: N/A;   LEFT HEART CATHETERIZATION WITH CORONARY ANGIOGRAM N/A 01/28/2012   Procedure: LEFT HEART CATHETERIZATION WITH CORONARY ANGIOGRAM;  Surgeon: Iran Ouch, MD;  Location: [facility] CATH LAB;  Service: Cardiovascular;  Laterality: N/A;   LEFT HEART CATHETERIZATION  WITH CORONARY ANGIOGRAM N/A 03/17/2014   Procedure: LEFT HEART CATHETERIZATION WITH CORONARY ANGIOGRAM;  Surgeon: Kathleene Hazel, MD;  Location: [facility];  Service: Cardiovascular;  Laterality: N/A;   ORIF TIBIA & FIBULA FRACTURES     PTCA     SHOULDER ARTHROSCOPY WITH DISTAL CLAVICLE RESECTION Left 12/16/2018   Procedure: LEFT SHOULDER ARTHROSCOPY WITH DISTAL CLAVICLE RESECTION;  Surgeon: Bjorn Pippin, MD;  Location: [facility];  Service: Orthopedics;  Laterality: Left;    SHOULDER ARTHROSCOPY WITH ROTATOR CUFF REPAIR AND SUBACROMIAL DECOMPRESSION Left 12/16/2018   Procedure: LEFT SHOULDER ARTHROSCOPY WITH ROTATOR CUFF REPAIR AND SUBACROMIAL DECOMPRESSION, DEBRIDEMENT;  Surgeon: Bjorn Pippin, MD;  Location: [facility];  Service: Orthopedics;  Laterality: Left;   SHOULDER ARTHROSCOPY WITH SUBACROMIAL DECOMPRESSION AND BICEP TENDON REPAIR Left 12/16/2018   Procedure: SHOULDER ARTHROSCOPY WITH SUBACROMIAL DECOMPRESSION AND BICEP TENDON REPAIR;  Surgeon: Bjorn Pippin, MD;  Location: [facility];  Service: Orthopedics;  Laterality: Left;   VASECTOMY      Current Outpatient Medications  Medication Sig Dispense Refill   Albuterol (PROVENTIL IN) Inhale into the lungs.     ALPRAZolam (XANAX) 0.25 MG tablet TAKE 1 TABLET BY MOUTH AT BEDTIME AS NEEDED FOR SLEEP 20 tablet 0   aspirin EC 81 MG tablet Take 81 mg by mouth daily.     atorvastatin (LIPITOR) 40 MG tablet Take 1 tablet (40 mg total) by mouth daily. 90 tablet 3   Budeson-Glycopyrrol-Formoterol (BREZTRI AEROSPHERE) 160-9-4.8 MCG/ACT AERO 2 puffs 1-2 times per day with spacer. 10.7 g 5   carvedilol (COREG) 6.25 MG tablet Take 1 tablet (6.25 mg total) by mouth 2 (two) times daily with a meal. 180 tablet 1   Cholecalciferol (VITAMIN D3 PO) Take 1 tablet by mouth daily.     Cyanocobalamin (B-12 PO) Take 1 tablet by mouth daily.     ferrous sulfate 325 (65 FE) MG EC tablet Take 325 mg by mouth daily.      fluticasone (FLONASE) 50 MCG/ACT nasal spray USE 1 TO 2 SPRAY(S) IN EACH NOSTRIL ONCE DAILY AS DIRECTED 48 g 0   furosemide (LASIX) 20 MG tablet Take 20 mg by mouth.      isosorbide mononitrate (IMDUR) 30 MG 24 hr tablet TAKE ONE TABLET BY MOUTH TWICE DAILY 180 tablet 3   levothyroxine (SYNTHROID) 100 MCG tablet Take 1 tablet (100 mcg total) by mouth daily. 90 tablet 1   metFORMIN (GLUCOPHAGE) 500 MG tablet TAKE 1 TABLET BY MOUTH TWICE DAILY WITH A MEAL 180 tablet 0    nitroGLYCERIN (NITROSTAT) 0.4 MG SL tablet Place 1 tablet (0.4 mg total) under the tongue every 5 (five) minutes x 3 doses as needed for chest pain. 25 tablet 3   ondansetron (ZOFRAN) 8 MG tablet Take 8 mg by mouth every 8 (eight) hours as needed for nausea or vomiting.      pantoprazole (PROTONIX) 40 MG tablet Take 1 tablet (40 mg total) by mouth daily. (Patient taking differently: Take 40 mg by mouth 2 (two) times daily. ) 90 tablet 1   pregabalin (LYRICA) 75 MG capsule Take 1 capsule by mouth twice daily 180 capsule 0   tamsulosin (FLOMAX) 0.4 MG CAPS capsule TAKE ONE CAPSULE BY MOUTH DAILY 30 capsule 5   ipratropium (ATROVENT) 0.06 % nasal spray 2 sprays each nostril every 6 hours. (Patient not taking: Reported on 02/20/2020) 15 mL 5   No current facility-administered medications for this visit.  Allergies  Allergen Reactions   Trazodone And Nefazodone     "makes me freak out"   Meloxicam Other (See Comments)    "jumping out of my skin" Pt feels anxiety attacks when on this medicine    Social History   Socioeconomic History   Marital status: Married    Spouse name: Not on file   Number of children: Not on file   Years of education: Not on file   Highest education level: Not on file  Occupational History   Occupation: Retired  Tobacco Use   Smoking status: Never Smoker   Smokeless tobacco: Never Used  Building services engineer Use: Never used  Substance and Sexual Activity   Alcohol use: Yes    Alcohol/week: 0.0 standard drinks    Comment: occ, once every 2 months   Drug use: No   Sexual activity: Not on file  Other Topics Concern   Not on file  Social History Narrative   Not on file   Social Determinants of Health   Financial Resource Strain:    Difficulty of Paying Living Expenses: Not on file  Food Insecurity:    Worried About Running Out of Food in the Last Year: Not on file   Ran Out of Food in the Last Year: Not on file  Transportation  Needs:    Lack of Transportation (Medical): Not on file   Lack of Transportation (Non-Medical): Not on file  Physical Activity:    Days of Exercise per Week: Not on file   Minutes of Exercise per Session: Not on file  Stress:    Feeling of Stress : Not on file  Social Connections:    Frequency of Communication with Friends and Family: Not on file   Frequency of Social Gatherings with Friends and Family: Not on file   Attends Religious Services: Not on file   Active Member of Clubs or Organizations: Not on file   Attends Banker Meetings: Not on file   Marital Status: Not on file  Intimate Partner Violence:    Fear of Current or Ex-Partner: Not on file   Emotionally Abused: Not on file   Physically Abused: Not on file   Sexually Abused: Not on file    Family History  Problem Relation Age of Onset   Asthma Mother    Allergies Sister    Coronary artery disease Other    Colon cancer Neg Hx    Esophageal cancer Neg Hx    Stomach cancer Neg Hx    Rectal cancer Neg Hx     Review of Systems:  As stated in the HPI and otherwise negative.   BP 128/64    Pulse 75    Ht 5\' 10"  (1.778 m)    Wt 208 lb (94.3 kg)    SpO2 96%    BMI 29.84 kg/m   Physical Examination:  General: Well developed, well nourished, NAD  HEENT: OP clear, mucus membranes moist  SKIN: warm, dry. No rashes. Neuro: No focal deficits  Musculoskeletal: Muscle strength 5/5 all ext  Psychiatric: Mood and affect normal  Neck: No JVD, no carotid bruits, no thyromegaly, no lymphadenopathy.  Lungs:Clear bilaterally, no wheezes, rhonci, crackles Cardiovascular: Regular rate and rhythm. No murmurs, gallops or rubs. Abdomen:Soft. Bowel sounds present. Non-tender.  Extremities: No lower extremity edema. Pulses are 2 + in the bilateral DP/PT.  Echo April 2021:  1. Left ventricular ejection fraction, by estimation, is 55 to 60%. The  left  ventricle has normal function. The left  ventricle demonstrates  regional wall motion abnormalities (see scoring diagram/findings for  description). There is mild concentric left  ventricular hypertrophy. Left ventricular diastolic parameters are  consistent with Grade I diastolic dysfunction (impaired relaxation).  2. Right ventricular systolic function is normal. The right ventricular  size is normal.  3. Left atrial size was mildly dilated.  4. The mitral valve is normal in structure. Mild mitral valve  regurgitation. No evidence of mitral stenosis.  5. The aortic valve is normal in structure. Aortic valve regurgitation is  not visualized. No aortic stenosis is present.  6. The inferior vena cava is normal in size with greater than 50%  respiratory variability, suggesting right atrial pressure of 3 mmHg.    EKG:  EKG is not ordered today. The ekg ordered today demonstrates   Recent Labs: 10/09/2019: B Natriuretic Peptide 355.1 10/11/2019: Hemoglobin 11.5; Platelets 204 10/26/2019: Magnesium 2.0 12/05/2019: ALT 14; BUN 22; Creatinine, Ser 1.48; Potassium 4.9; Sodium 140 01/18/2020: TSH 3.37   Lipid Panel Lipid Panel     Component Value Date/Time   CHOL 113 12/05/2019 0748   TRIG 119 12/05/2019 0748   HDL 41 12/05/2019 0748   CHOLHDL 2.8 12/05/2019 0748   CHOLHDL 3.8 10/09/2019 0511   VLDL 37 10/09/2019 0511   LDLCALC 51 12/05/2019 0748    Wt Readings from Last 3 Encounters:  02/20/20 208 lb (94.3 kg)  01/23/20 213 lb (96.6 kg)  01/18/20 215 lb 1.6 oz (97.6 kg)     Other studies Reviewed: Additional studies/ records that were reviewed today include: . Review of the above records demonstrates:    Assessment and Plan:   1. CAD with stable angina: No chest pain. CAD stable by cardiac cath May 2021. Will continue ASA, beta blocker, statin and Imdur.      2. HTN: BP well controlled. No changes  3. HLD: [facility]. Continue statin  4. PVCs: No dizziness. Continue beta blocker  5. Carotid  artery disease: Mild bilateral carotid artery disease by dopplers April 2021  Current medicines are reviewed at length with the patient today.  The patient does not have concerns regarding medicines.  The following changes have been made:  no change  Labs/ tests ordered today include:   No orders of the defined types were placed in this encounter.   Disposition:   FU with me in 12  months   Signed, Verne Carrowhristopher Maddyx Vallie, MD 02/20/2020 2:12 PM    [facility] [address], [place], [place]  [address] Phone: [phone]; Fax: [phone]

## 2020-02-20 ENCOUNTER — Ambulatory Visit: Payer: Medicare Other | Admitting: Cardiovascular Disease

## 2020-02-20 ENCOUNTER — Encounter: Payer: Self-pay | Admitting: Cardiovascular Disease

## 2020-02-20 ENCOUNTER — Other Ambulatory Visit: Payer: Self-pay

## 2020-02-20 VITALS — BP 128/64 | HR 75 | Ht 70.0 in | Wt 208.0 lb

## 2020-02-20 DIAGNOSIS — I493 Ventricular premature depolarization: Secondary | ICD-10-CM | POA: Diagnosis not present

## 2020-02-20 DIAGNOSIS — I10 Essential (primary) hypertension: Secondary | ICD-10-CM | POA: Diagnosis not present

## 2020-02-20 DIAGNOSIS — I25118 Atherosclerotic heart disease of native coronary artery with other forms of angina pectoris: Secondary | ICD-10-CM

## 2020-02-20 DIAGNOSIS — E785 Hyperlipidemia, unspecified: Secondary | ICD-10-CM

## 2020-02-20 NOTE — Patient Instructions (Signed)
Medication Instructions:  Your provider recommends that you continue on your current medications as directed. Please refer to the Current Medication list given to you today.   *If you need a refill on your cardiac medications before your next appointment, please call your pharmacy*  Follow-Up: At [facility], you and your health needs are our priority.  As part of our continuing mission to provide you with exceptional heart care, we have created designated Provider Care Teams.  These Care Teams include your primary Cardiologist (physician) and Advanced Practice Providers (APPs -  Physician Assistants and Nurse Practitioners) who all work together to provide you with the care you need, when you need it. Your next appointment:   12 month(s) The format for your next appointment:   In Person Provider:   Dr. McAlhany 

## 2020-02-21 ENCOUNTER — Encounter: Payer: Self-pay | Admitting: Internal Medicine

## 2020-02-23 ENCOUNTER — Encounter: Payer: Self-pay | Admitting: Internal Medicine

## 2020-03-02 ENCOUNTER — Other Ambulatory Visit: Payer: Self-pay | Admitting: Internal Medicine

## 2020-03-02 DIAGNOSIS — G4700 Insomnia, unspecified: Secondary | ICD-10-CM

## 2020-03-13 DIAGNOSIS — M25512 Pain in left shoulder: Secondary | ICD-10-CM | POA: Diagnosis not present

## 2020-04-03 ENCOUNTER — Encounter: Payer: Self-pay | Admitting: Internal Medicine

## 2020-04-06 ENCOUNTER — Encounter: Payer: Self-pay | Admitting: Internal Medicine

## 2020-04-13 DIAGNOSIS — H349 Unspecified retinal vascular occlusion: Secondary | ICD-10-CM | POA: Diagnosis not present

## 2020-04-23 ENCOUNTER — Encounter: Payer: Self-pay | Admitting: Internal Medicine

## 2020-05-01 ENCOUNTER — Other Ambulatory Visit: Payer: Self-pay | Admitting: Internal Medicine

## 2020-05-01 DIAGNOSIS — G4700 Insomnia, unspecified: Secondary | ICD-10-CM

## 2020-05-09 ENCOUNTER — Other Ambulatory Visit: Payer: Self-pay | Admitting: Allergy and Immunology

## 2020-05-28 DIAGNOSIS — T1512XA Foreign body in conjunctival sac, left eye, initial encounter: Secondary | ICD-10-CM | POA: Diagnosis not present

## 2020-06-06 ENCOUNTER — Other Ambulatory Visit: Payer: Self-pay | Admitting: Internal Medicine

## 2020-06-06 DIAGNOSIS — G4700 Insomnia, unspecified: Secondary | ICD-10-CM

## 2020-06-07 ENCOUNTER — Other Ambulatory Visit: Payer: Self-pay | Admitting: Internal Medicine

## 2020-06-07 DIAGNOSIS — E039 Hypothyroidism, unspecified: Secondary | ICD-10-CM

## 2020-06-18 ENCOUNTER — Other Ambulatory Visit: Payer: Self-pay | Admitting: Internal Medicine

## 2020-06-18 DIAGNOSIS — E1142 Type 2 diabetes mellitus with diabetic polyneuropathy: Secondary | ICD-10-CM

## 2020-06-18 DIAGNOSIS — E039 Hypothyroidism, unspecified: Secondary | ICD-10-CM

## 2020-06-21 ENCOUNTER — Encounter: Payer: Self-pay | Admitting: Internal Medicine

## 2020-07-07 ENCOUNTER — Encounter: Payer: Self-pay | Admitting: Internal Medicine

## 2020-07-28 ENCOUNTER — Encounter: Payer: Self-pay | Admitting: Internal Medicine

## 2020-07-31 ENCOUNTER — Other Ambulatory Visit: Payer: Self-pay

## 2020-07-31 ENCOUNTER — Ambulatory Visit: Payer: Medicare Other | Admitting: Allergy and Immunology

## 2020-07-31 VITALS — BP 138/78 | HR 70 | Temp 97.8°F | Resp 16 | Ht 70.5 in | Wt 213.0 lb

## 2020-07-31 DIAGNOSIS — J3089 Other allergic rhinitis: Secondary | ICD-10-CM

## 2020-07-31 DIAGNOSIS — K219 Gastro-esophageal reflux disease without esophagitis: Secondary | ICD-10-CM

## 2020-07-31 DIAGNOSIS — J4489 Other specified chronic obstructive pulmonary disease: Secondary | ICD-10-CM

## 2020-07-31 DIAGNOSIS — J449 Chronic obstructive pulmonary disease, unspecified: Secondary | ICD-10-CM | POA: Diagnosis not present

## 2020-07-31 MED ORDER — FLUTICASONE PROPIONATE 50 MCG/ACT NA SUSP: NASAL | 1 refills | Status: DC

## 2020-07-31 MED ORDER — ALBUTEROL SULFATE HFA 108 (90 BASE) MCG/ACT IN AERS
2.0000 | INHALATION_SPRAY | RESPIRATORY_TRACT | 3 refills | Status: DC | PRN
Start: 2020-07-31 — End: 2021-01-29

## 2020-07-31 MED ORDER — BREZTRI AEROSPHERE 160-9-4.8 MCG/ACT IN AERO: INHALATION_SPRAY | RESPIRATORY_TRACT | 5 refills | Status: DC

## 2020-07-31 MED ORDER — OLOPATADINE HCL 0.1 % OP SOLN: 2.0000 [drp] | Freq: Two times a day (BID) | OPHTHALMIC | 5 refills | Status: DC

## 2020-07-31 MED ORDER — IPRATROPIUM BROMIDE 0.06 % NA SOLN: NASAL | 5 refills | Status: DC

## 2020-07-31 MED ORDER — FLOVENT HFA 110 MCG/ACT IN AERO: 3.0000 | INHALATION_SPRAY | Freq: Three times a day (TID) | RESPIRATORY_TRACT | 5 refills | Status: DC

## 2020-07-31 NOTE — Progress Notes (Signed)
[place] - [place] - [place] - [place] - [name]   Follow-up Note  Referring Provider: Philip Aspen, Almira Bar* Primary Provider: Philip Aspen, Limmie Patricia, MD Date of Office Visit: 07/31/2020  Subjective:   Richard Ho (DOB: 1940-07-09) is a 80 y.o. male who [facility] in re-evaluation of the following:  HPI: Avyon returns to this clinic in evaluation of COPD/asthma overlap, allergic rhinoconjunctivitis, history of reflux.  His last visit to this clinic was 17 January 2020.  Overall he has really done well with his lungs.  He uses a short acting bronchodilator 1 or 2 times per week.  When he was last seen in this clinic he was having a problem with Breo induced laryngitis and we gave him a triple inhaler MDI with spacing device and this has really worked well for him.  He is only using his triple inhaler 1 time per day at this point.  He has not required a systemic steroid or an antibiotic for any type of airway issue.  His nose is doing relatively well but he still has some occasional rhinorrhea especially around the time that he eats.  He does continue to use nasal steroid on a consistent basis and occasionally uses nasal ipratropium.  He did visit with his gastroenterologist and is doing very well regarding his reflux at this point in time.  He is using his Protonix only 1 time per day at this point.  He does not have any more esophageal obstructive events now that he has his dentures.  He has received 3 Moderna Covid vaccinations and the flu vaccine.  Allergies as of 07/31/2020      Reactions   Trazodone And Nefazodone    "makes me freak out"   Meloxicam Other (See Comments)   "jumping out of my skin" Pt feels anxiety attacks when on this medicine      Medication List      ALPRAZolam 0.25 MG tablet Commonly known as: XANAX TAKE 1 TABLET BY MOUTH AT BEDTIME AS NEEDED FOR SLEEP   aspirin EC 81 MG tablet Take  81 mg by mouth daily.   atorvastatin 40 MG tablet Commonly known as: LIPITOR Take 1 tablet (40 mg total) by mouth daily.   B-12 PO Take 1 tablet by mouth daily.   Breztri Aerosphere 160-9-4.8 MCG/ACT Aero Generic drug: Budeson-Glycopyrrol-Formoterol 2 puffs 1-2 times per day with spacer.   carvedilol 6.25 MG tablet Commonly known as: COREG Take 1 tablet (6.25 mg total) by mouth 2 (two) times daily with a meal.   ferrous sulfate 325 (65 FE) MG EC tablet Take 325 mg by mouth daily.   fluticasone 50 MCG/ACT nasal spray Commonly known as: FLONASE USE 1 TO 2 SPRAY(S) IN EACH NOSTRIL ONCE DAILY AS DIRECTED   furosemide 20 MG tablet Commonly known as: LASIX Take 20 mg by mouth.   ipratropium 0.06 % nasal spray Commonly known as: ATROVENT 2 sprays each nostril every 6 hours.   isosorbide mononitrate 30 MG 24 hr tablet Commonly known as: IMDUR TAKE ONE TABLET BY MOUTH TWICE DAILY   levothyroxine 100 MCG tablet Commonly known as: SYNTHROID Take 1 tablet by mouth once daily   metFORMIN 500 MG tablet Commonly known as: GLUCOPHAGE TAKE 1 TABLET BY MOUTH TWICE DAILY WITH A MEAL   nitroGLYCERIN 0.4 MG SL tablet Commonly known as: NITROSTAT Place 1 tablet (0.4 mg total) under the tongue every 5 (five) minutes x 3 doses  as needed for chest pain.   ondansetron 8 MG tablet Commonly known as: ZOFRAN Take 8 mg by mouth every 8 (eight) hours as needed for nausea or vomiting.   pantoprazole 40 MG tablet Commonly known as: PROTONIX Take 1 tablet (40 mg total) by mouth daily.   pregabalin 75 MG capsule Commonly known as: LYRICA Take 1 capsule by mouth twice daily   PROVENTIL IN Inhale into the lungs.   tamsulosin 0.4 MG Caps capsule Commonly known as: FLOMAX TAKE ONE CAPSULE BY MOUTH DAILY   VITAMIN D3 PO Take 1 tablet by mouth daily.       Past Medical History:  Diagnosis Date  . Anemia   . Asthma   . Cataract   . CKD (chronic kidney disease), stage III   .  Coronary artery disease 1991   a) Angioplasty LAD 1991 [place] b) MI in 1995 at  [facility] in [place], med Rx c) Cath 10/2019 for NSTEMI - see report -> med rx.  . Diabetes mellitus   . GERD (gastroesophageal reflux disease)   . Hearing loss   . HTN (hypertension)   . Hyperlipidemia   . Hypothyroidism   . Myocardial infarct (HCC)   . Nephrolithiasis   . Osteopenia   . Premature atrial contractions   . PVC's (premature ventricular contractions)   . Snake bite     Past Surgical History:  Procedure Laterality Date  . ANGIOPLASTY  1991  . CARDIAC CATHETERIZATION  2005   Moderate LCx and LAD disease and severe diffuse RCA disease  . CARDIAC CATHETERIZATION  01/2012   normal left main, 50% pLAD stenosis, dLAD mild luminal irregularities, D1 30-40% stenosis at take off; subtotally occluded/severely diseased OM1, mid 80% stenosis in small OM2, long prox and mid RCA stenoses ranging from 60-95%, severely diseased PDA, distal PLSA occluded filling via L-R collateral; normal LV function; inferior lateral basal akinesis.  . CHOLECYSTECTOMY  2012  . EYE SURGERY  2010   cataract - right, has left done too  . INGUINAL HERNIA REPAIR    . INTRAVASCULAR PRESSURE WIRE/FFR STUDY N/A 10/10/2019   Procedure: INTRAVASCULAR PRESSURE WIRE/FFR STUDY;  Surgeon: Runell Gess, MD;  Location: [facility];  Service: Cardiovascular;  Laterality: N/A;  . LEFT HEART CATH AND CORONARY ANGIOGRAPHY N/A 10/10/2019   Procedure: LEFT HEART CATH AND CORONARY ANGIOGRAPHY;  Surgeon: Runell Gess, MD;  Location: [facility];  Service: Cardiovascular;  Laterality: N/A;  . LEFT HEART CATHETERIZATION WITH CORONARY ANGIOGRAM N/A 01/28/2012   Procedure: LEFT HEART CATHETERIZATION WITH CORONARY ANGIOGRAM;  Surgeon: Iran Ouch, MD;  Location: [facility] CATH LAB;  Service: Cardiovascular;  Laterality: N/A;  . LEFT HEART CATHETERIZATION WITH CORONARY ANGIOGRAM N/A 03/17/2014   Procedure: LEFT HEART  CATHETERIZATION WITH CORONARY ANGIOGRAM;  Surgeon: Kathleene Hazel, MD;  Location: [facility] CATH LAB;  Service: Cardiovascular;  Laterality: N/A;  . ORIF TIBIA & FIBULA FRACTURES    . PTCA    . SHOULDER ARTHROSCOPY WITH DISTAL CLAVICLE RESECTION Left 12/16/2018   Procedure: LEFT SHOULDER ARTHROSCOPY WITH DISTAL CLAVICLE RESECTION;  Surgeon: Bjorn Pippin, MD;  Location: [facility];  Service: Orthopedics;  Laterality: Left;  . SHOULDER ARTHROSCOPY WITH ROTATOR CUFF REPAIR AND SUBACROMIAL DECOMPRESSION Left 12/16/2018   Procedure: LEFT SHOULDER ARTHROSCOPY WITH ROTATOR CUFF REPAIR AND SUBACROMIAL DECOMPRESSION, DEBRIDEMENT;  Surgeon: Bjorn Pippin, MD;  Location: [facility];  Service: Orthopedics;  Laterality: Left;  . SHOULDER ARTHROSCOPY WITH  SUBACROMIAL DECOMPRESSION AND BICEP TENDON REPAIR Left 12/16/2018   Procedure: SHOULDER ARTHROSCOPY WITH SUBACROMIAL DECOMPRESSION AND BICEP TENDON REPAIR;  Surgeon: Bjorn Pippin, MD;  Location: [facility];  Service: Orthopedics;  Laterality: Left;  Marland Kitchen VASECTOMY      Review of systems negative except as noted in HPI / PMHx or noted below:  Review of Systems  Constitutional: Negative.   HENT: Negative.   Eyes: Negative.   Respiratory: Negative.   Cardiovascular: Negative.   Gastrointestinal: Negative.   Genitourinary: Negative.   Musculoskeletal: Negative.   Skin: Negative.   Neurological: Negative.   Endo/Heme/Allergies: Negative.   Psychiatric/Behavioral: Negative.      Objective:   Vitals:   07/31/20 0850  BP: 138/78  Pulse: 70  Resp: 16  Temp: 97.8 F (36.6 C)  SpO2: 95%   Height: 5' 10.5" (179.1 cm)  Weight: 213 lb (96.6 kg)   Physical Exam Constitutional:      Appearance: He is not diaphoretic.  HENT:     Head: Normocephalic.     Right Ear: Tympanic membrane, ear canal and external ear normal.     Left Ear: Tympanic membrane, ear canal and external ear normal.     Nose: Nose normal.  No mucosal edema or rhinorrhea.     Mouth/Throat:     Mouth: Oropharynx is clear and moist and mucous membranes are normal.     Pharynx: Uvula midline. No oropharyngeal exudate.  Eyes:     Conjunctiva/sclera: Conjunctivae normal.  Neck:     Thyroid: No thyromegaly.     Trachea: Trachea normal. No tracheal tenderness or tracheal deviation.  Cardiovascular:     Rate and Rhythm: Normal rate and regular rhythm.     Heart sounds: Normal heart sounds, S1 normal and S2 normal. No murmur heard.   Pulmonary:     Effort: No respiratory distress.     Breath sounds: Normal breath sounds. No stridor. No wheezing or rales.  Musculoskeletal:        General: No edema.  Lymphadenopathy:     Head:     Right side of head: No tonsillar adenopathy.     Left side of head: No tonsillar adenopathy.     Cervical: No cervical adenopathy.  Skin:    Findings: No erythema or rash.     Nails: There is no clubbing.  Neurological:     Mental Status: He is alert.     Diagnostics:    Spirometry was performed and demonstrated an FEV1 of 2.18 at 75 % of predicted.  Assessment and Plan:   1. COPD with asthma (HCC)   2. Other allergic rhinitis   3. Gastroesophageal reflux disease, unspecified whether esophagitis present     1. Continue to Perform Allergen avoidance measures - dust mite, pollens, molds  2. Continue to Treat and prevent inflammation:   A. Breztri - 2 inhalations 1-2 times per day with spacer  B. Flonase - 1 spray each nostril 2 times per day  3. If needed:   A. Proventil or Ventolin HFA 2 puffs every 4-6 hours  B. OTC antihistamine - Claritin/Zyrtec/Allegra  C. nasal ipratropium 0.06% 2 sprays each nostril every 6 hours  D. Patanol - 1 drop each eye 1-2 times per day  4. "Action plan" for asthma flare up:   A. continue Breztri  B. add Flovent 110 3 inhalations 3 times a day with spacer  C. use Proventil or Ventolin HFA if needed  5. Continue treatment for  reflux with Protonix  40 mg 1-2 times a day   6. Return to clinic in 6 months or earlier if problem  [name] appears to be doing quite well at this point in time on his current medical plan which includes anti-inflammatory agents for both his upper and lower airway and consistent use of therapy directed against reflux along with several medications that can be utilized as needed.  I am not going to change any of his therapy during today's visit.  Assuming he does well with this plan I will see him back in his clinic in 6 months or earlier if there is a problem.  Laurette SchimkeEric Mayra Jolliffe, MD Allergy / Immunology [facility]

## 2020-07-31 NOTE — Patient Instructions (Signed)
  1. Continue to Perform Allergen avoidance measures - dust mite, pollens, molds  2. Continue to Treat and prevent inflammation:   A. Breztri - 2 inhalations 1-2 times per day with spacer  B. Flonase - 1 spray each nostril 2 times per day  3. If needed:   A. Proventil or Ventolin HFA 2 puffs every 4-6 hours  B. OTC antihistamine - Claritin/Zyrtec/Allegra  C. nasal ipratropium 0.06% 2 sprays each nostril every 6 hours  D. Patanol - 1 drop each eye 1-2 times per day  4. "Action plan" for asthma flare up:   A. continue Breztri  B. add Flovent 110 3 inhalations 3 times a day with spacer  C. use Proventil or Ventolin HFA if needed  5. Continue treatment for reflux with Protonix 40 mg 1-2 times a day   6. Return to clinic in 6 months or earlier if problem

## 2020-08-01 ENCOUNTER — Encounter: Payer: Self-pay | Admitting: Allergy and Immunology

## 2020-08-22 ENCOUNTER — Encounter: Payer: Self-pay | Admitting: Internal Medicine

## 2020-08-23 ENCOUNTER — Other Ambulatory Visit: Payer: Self-pay | Admitting: Internal Medicine

## 2020-08-23 DIAGNOSIS — G4700 Insomnia, unspecified: Secondary | ICD-10-CM

## 2020-09-17 DIAGNOSIS — H349 Unspecified retinal vascular occlusion: Secondary | ICD-10-CM | POA: Diagnosis not present

## 2020-09-19 ENCOUNTER — Other Ambulatory Visit: Payer: Self-pay | Admitting: Internal Medicine

## 2020-09-19 DIAGNOSIS — E1142 Type 2 diabetes mellitus with diabetic polyneuropathy: Secondary | ICD-10-CM

## 2020-10-05 DIAGNOSIS — M25511 Pain in right shoulder: Secondary | ICD-10-CM | POA: Diagnosis not present

## 2020-10-17 ENCOUNTER — Encounter: Payer: Self-pay | Admitting: Internal Medicine

## 2020-10-17 ENCOUNTER — Other Ambulatory Visit: Payer: Self-pay | Admitting: Internal Medicine

## 2020-10-17 DIAGNOSIS — G4700 Insomnia, unspecified: Secondary | ICD-10-CM

## 2020-11-12 ENCOUNTER — Emergency Department (HOSPITAL_COMMUNITY): Payer: Medicare Other

## 2020-11-12 ENCOUNTER — Other Ambulatory Visit: Payer: Self-pay

## 2020-11-12 ENCOUNTER — Emergency Department (HOSPITAL_COMMUNITY)
Admission: EM | Admit: 2020-11-12 | Discharge: 2020-11-12 | Disposition: A | Discharge status: Home / Self Care | Payer: Medicare Other | Attending: Emergency Medicine | Admitting: Emergency Medicine

## 2020-11-12 ENCOUNTER — Encounter (HOSPITAL_COMMUNITY): Payer: Self-pay

## 2020-11-12 DIAGNOSIS — Z7951 Long term (current) use of inhaled steroids: Secondary | ICD-10-CM | POA: Diagnosis not present

## 2020-11-12 DIAGNOSIS — N183 Chronic kidney disease, stage 3 unspecified: Secondary | ICD-10-CM | POA: Diagnosis not present

## 2020-11-12 DIAGNOSIS — R0602 Shortness of breath: Secondary | ICD-10-CM | POA: Diagnosis not present

## 2020-11-12 DIAGNOSIS — E1140 Type 2 diabetes mellitus with diabetic neuropathy, unspecified: Secondary | ICD-10-CM | POA: Diagnosis not present

## 2020-11-12 DIAGNOSIS — J4541 Moderate persistent asthma with (acute) exacerbation: Secondary | ICD-10-CM | POA: Insufficient documentation

## 2020-11-12 DIAGNOSIS — Z79899 Other long term (current) drug therapy: Secondary | ICD-10-CM | POA: Insufficient documentation

## 2020-11-12 DIAGNOSIS — Z9049 Acquired absence of other specified parts of digestive tract: Secondary | ICD-10-CM | POA: Diagnosis not present

## 2020-11-12 DIAGNOSIS — Z7984 Long term (current) use of oral hypoglycemic drugs: Secondary | ICD-10-CM | POA: Insufficient documentation

## 2020-11-12 DIAGNOSIS — Z955 Presence of coronary angioplasty implant and graft: Secondary | ICD-10-CM | POA: Insufficient documentation

## 2020-11-12 DIAGNOSIS — I517 Cardiomegaly: Secondary | ICD-10-CM | POA: Diagnosis not present

## 2020-11-12 DIAGNOSIS — I2510 Atherosclerotic heart disease of native coronary artery without angina pectoris: Secondary | ICD-10-CM | POA: Insufficient documentation

## 2020-11-12 DIAGNOSIS — Z7982 Long term (current) use of aspirin: Secondary | ICD-10-CM | POA: Diagnosis not present

## 2020-11-12 DIAGNOSIS — E1122 Type 2 diabetes mellitus with diabetic chronic kidney disease: Secondary | ICD-10-CM | POA: Insufficient documentation

## 2020-11-12 DIAGNOSIS — T751XXA Unspecified effects of drowning and nonfatal submersion, initial encounter: Secondary | ICD-10-CM | POA: Diagnosis not present

## 2020-11-12 DIAGNOSIS — I491 Atrial premature depolarization: Secondary | ICD-10-CM | POA: Diagnosis not present

## 2020-11-12 DIAGNOSIS — R600 Localized edema: Secondary | ICD-10-CM | POA: Diagnosis not present

## 2020-11-12 DIAGNOSIS — E039 Hypothyroidism, unspecified: Secondary | ICD-10-CM | POA: Insufficient documentation

## 2020-11-12 DIAGNOSIS — I129 Hypertensive chronic kidney disease with stage 1 through stage 4 chronic kidney disease, or unspecified chronic kidney disease: Secondary | ICD-10-CM | POA: Insufficient documentation

## 2020-11-12 DIAGNOSIS — R42 Dizziness and giddiness: Secondary | ICD-10-CM | POA: Diagnosis not present

## 2020-11-12 DIAGNOSIS — Z20822 Contact with and (suspected) exposure to covid-19: Secondary | ICD-10-CM | POA: Diagnosis not present

## 2020-11-12 DIAGNOSIS — J9811 Atelectasis: Secondary | ICD-10-CM | POA: Diagnosis not present

## 2020-11-12 LAB — CBC
HCT: 38.6 % — ABNORMAL LOW (ref 39.0–52.0)
Hemoglobin: 12.8 g/dL — ABNORMAL LOW (ref 13.0–17.0)
MCH: 34 pg (ref 26.0–34.0)
MCHC: 33.2 g/dL (ref 30.0–36.0)
MCV: 102.7 fL — ABNORMAL HIGH (ref 80.0–100.0)
Platelets: 228 10*3/uL (ref 150–400)
RBC: 3.76 MIL/uL — ABNORMAL LOW (ref 4.22–5.81)
RDW: 13.9 % (ref 11.5–15.5)
WBC: 7.1 10*3/uL (ref 4.0–10.5)
nRBC: 0 % (ref 0.0–0.2)

## 2020-11-12 LAB — I-STAT VENOUS BLOOD GAS, ED
Acid-Base Excess: 0 mmol/L (ref 0.0–2.0)
Bicarbonate: 24.5 mmol/L (ref 20.0–28.0)
Calcium, Ion: 1.24 mmol/L (ref 1.15–1.40)
HCT: 34 % — ABNORMAL LOW (ref 39.0–52.0)
Hemoglobin: 11.6 g/dL — ABNORMAL LOW (ref 13.0–17.0)
O2 Saturation: 83 %
Potassium: 4.8 mmol/L (ref 3.5–5.1)
Sodium: 137 mmol/L (ref 135–145)
TCO2: 26 mmol/L (ref 22–32)
pCO2, Ven: 37.3 mmHg — ABNORMAL LOW (ref 44.0–60.0)
pH, Ven: 7.425 (ref 7.250–7.430)
pO2, Ven: 46 mmHg — ABNORMAL HIGH (ref 32.0–45.0)

## 2020-11-12 LAB — BASIC METABOLIC PANEL
Anion gap: 11 (ref 5–15)
BUN: 21 mg/dL (ref 8–23)
CO2: 20 mmol/L — ABNORMAL LOW (ref 22–32)
Calcium: 9.5 mg/dL (ref 8.9–10.3)
Chloride: 106 mmol/L (ref 98–111)
Creatinine, Ser: 1.44 mg/dL — ABNORMAL HIGH (ref 0.61–1.24)
GFR, Estimated: 49 mL/min — ABNORMAL LOW (ref >60)
Glucose, Bld: 113 mg/dL — ABNORMAL HIGH (ref 70–99)
Potassium: 4.5 mmol/L (ref 3.5–5.1)
Sodium: 137 mmol/L (ref 135–145)

## 2020-11-12 LAB — TROPONIN I (HIGH SENSITIVITY)
Troponin I (High Sensitivity): 9 ng/L (ref <18)
Troponin I (High Sensitivity): 8 ng/L (ref <18)

## 2020-11-12 LAB — D-DIMER, QUANTITATIVE: D-Dimer, Quant: 0.87 ug/mL-FEU — ABNORMAL HIGH (ref 0.00–0.50)

## 2020-11-12 LAB — BRAIN NATRIURETIC PEPTIDE: B Natriuretic Peptide: 82.6 pg/mL (ref 0.0–100.0)

## 2020-11-12 LAB — RESP PANEL BY RT-PCR (FLU A&B, COVID) ARPGX2
Influenza A by PCR: NEGATIVE
Influenza B by PCR: NEGATIVE
SARS Coronavirus 2 by RT PCR: NEGATIVE

## 2020-11-12 MED ORDER — PREDNISONE 20 MG PO TABS: 40.0000 mg | ORAL_TABLET | Freq: Every day | ORAL | 0 refills | Status: DC

## 2020-11-12 MED ORDER — IPRATROPIUM BROMIDE HFA 17 MCG/ACT IN AERS
2.0000 | INHALATION_SPRAY | Freq: Once | RESPIRATORY_TRACT | Status: AC
  Administered 2020-11-12: 2 via RESPIRATORY_TRACT
  Filled 2020-11-12: qty 12.9

## 2020-11-12 MED ORDER — IPRATROPIUM BROMIDE 0.02 % IN SOLN
0.5000 mg | Freq: Once | RESPIRATORY_TRACT | Status: AC
  Administered 2020-11-12: 0.5 mg via RESPIRATORY_TRACT
  Filled 2020-11-12: qty 2.5

## 2020-11-12 MED ORDER — PREDNISONE 20 MG PO TABS
60.0000 mg | ORAL_TABLET | Freq: Once | ORAL | Status: AC
  Administered 2020-11-12: 60 mg via ORAL
  Filled 2020-11-12: qty 3

## 2020-11-12 MED ORDER — ALBUTEROL SULFATE (2.5 MG/3ML) 0.083% IN NEBU
5.0000 mg | INHALATION_SOLUTION | Freq: Once | RESPIRATORY_TRACT | Status: AC
  Administered 2020-11-12: 5 mg via RESPIRATORY_TRACT
  Filled 2020-11-12: qty 6

## 2020-11-12 MED ORDER — NITROGLYCERIN 0.4 MG SL SUBL: 0.4000 mg | SUBLINGUAL_TABLET | SUBLINGUAL | Status: DC | PRN

## 2020-11-12 MED ORDER — IOHEXOL 350 MG/ML SOLN
66.0000 mL | Freq: Once | INTRAVENOUS | Status: AC | PRN
  Administered 2020-11-12: 66 mL via INTRAVENOUS

## 2020-11-12 NOTE — ED Notes (Signed)
Lab called to add on d-dimer and BNP

## 2020-11-12 NOTE — Discharge Instructions (Signed)
Continue to use your Breztri inhaler the way you normally do.  Use the albuterol every 6 hours 2 puffs as needed.  You can also use the Atrovent inhaler 2 puffs 2 times a day as needed for the next 2 to 3 days.  Also you were given a dose of prednisone here but a prescription for the next 5 days to help with inflammation.  If you start having worsening shortness of breath, chest pain, any passing out, fever or other concerns return to the emergency room.  Today all your labs including your heart markers look normal.  There is no sign of infection or blood clots on your CAT scan.

## 2020-11-12 NOTE — ED Triage Notes (Signed)
Patient arrives with [facility] for shortness of breath, woke up feeling like he was "drowning", hx of asthma, denies COPD or CHF, then broke out in cold sweat, 96% on RA, patient placed on 4L [place] for comfort, also with some nausea and lightheadedness, having frequent couplet PVCs.   EMS vitals 156/82 80 HR 115 CBG  18 R A/C

## 2020-11-12 NOTE — ED Notes (Signed)
Patient transported to CT 

## 2020-11-12 NOTE — ED Provider Notes (Signed)
[facility] EMERGENCY DEPARTMENT Provider Note   CSN: [id number] Arrival date & time: 11/12/20  0424     History Chief Complaint  Patient presents with  . Shortness of Breath    Richard Ho is a 80 y.o. male.  80 yo M with a chief complaint of shortness of breath.  Patient feels like he is drowning.  Has been going on for about 4 to 5 days now.  Seems to be worse with exertion.  He thinks it is mildly worse when he lays back flat as well.  No significant cough.  No fevers.  Has noticed his legs are little bit more swollen than normal.  Denies history of heart failure.  Denies chest pain or pressure.  Feels different from when he had a heart attack previously.  The history is provided by the patient.  Shortness of Breath Severity:  Moderate Onset quality:  Gradual Duration:  4 days Timing:  Intermittent Progression:  Waxing and waning Chronicity:  New Relieved by:  Nothing Worsened by:  Activity (lying flat) Ineffective treatments:  None tried Associated symptoms: no abdominal pain, no chest pain, no fever, no headaches, no rash and no vomiting        Past Medical History:  Diagnosis Date  . Anemia   . Asthma   . Cataract   . CKD (chronic kidney disease), stage III (HCC)   . Coronary artery disease 1991   a) Angioplasty LAD 1991 [place] b) MI in 1995 at  [facility] in [place], med Rx c) Cath 10/2019 for NSTEMI - see report -> med rx.  . Diabetes mellitus   . GERD (gastroesophageal reflux disease)   . Hearing loss   . HTN (hypertension)   . Hyperlipidemia   . Hypothyroidism   . Myocardial infarct (HCC)   . Nephrolithiasis   . Osteopenia   . Premature atrial contractions   . PVC's (premature ventricular contractions)   . Snake bite     Patient Active Problem List   Diagnosis Date Noted  . NSTEMI (non-ST elevated myocardial infarction) (HCC) 10/08/2019  . Vitamin D deficiency 05/18/2019  . Hyperkalemia 11/03/2018  . Retinal  detachment 04/29/2018  . Morbid obesity (HCC) 04/29/2018  . Diabetes mellitus with renal complications (HCC) 12/21/2015  . After cataract of both eyes not obscuring vision 02/17/2014  . Dry eye syndrome, bilateral 02/17/2014  . PVD (posterior vitreous detachment), both eyes 02/17/2014  . Chronic kidney disease, stage III (moderate) (HCC) 01/26/2014  . Diabetic peripheral neuropathy (HCC) 11/21/2013  . Pseudophakia of both eyes 08/18/2013  . Pseudophakia, left eye 07/07/2013  . Pseudophakia of right eye 06/24/2013  . After cataract, right eye 05/02/2013  . Arcus senilis of both corneas 05/02/2013  . Combined senile cataract 05/02/2013  . Dermatochalasis of eyelid 05/02/2013  . PVD (posterior vitreous detachment), right eye 05/02/2013  . Macrocytic anemia 01/29/2012  . Hypothyroidism 10/22/2011  . Type 2 diabetes mellitus with renal complication (HCC) 03/14/2011  . Asthma with exacerbation 06/09/2007  . Essential hypertension 04/08/2007  . Dyslipidemia 05/04/2006  . MYOCARDIAL INFARCTION, HX OF 05/04/2006  . Coronary atherosclerosis 05/04/2006  . Asthma 05/04/2006  . GERD 05/04/2006  . OSTEOPENIA 05/04/2006  . PSA, INCREASED 05/04/2006  . NEPHROLITHIASIS, HX OF 05/04/2006    Past Surgical History:  Procedure Laterality Date  . ANGIOPLASTY  1991  . CARDIAC CATHETERIZATION  2005   Moderate LCx and LAD disease and severe diffuse RCA disease  . CARDIAC CATHETERIZATION  01/2012   normal left main, 50% pLAD stenosis, dLAD mild luminal irregularities, D1 30-40% stenosis at take off; subtotally occluded/severely diseased OM1, mid 80% stenosis in small OM2, long prox and mid RCA stenoses ranging from 60-95%, severely diseased PDA, distal PLSA occluded filling via L-R collateral; normal LV function; inferior lateral basal akinesis.  . CHOLECYSTECTOMY  2012  . EYE SURGERY  2010   cataract - right, has left done too  . INGUINAL HERNIA REPAIR    . INTRAVASCULAR PRESSURE WIRE/FFR STUDY N/A  10/10/2019   Procedure: INTRAVASCULAR PRESSURE WIRE/FFR STUDY;  Surgeon: Runell Gess, MD;  Location: [facility] INVASIVE CV LAB;  Service: Cardiovascular;  Laterality: N/A;  . LEFT HEART CATH AND CORONARY ANGIOGRAPHY N/A 10/10/2019   Procedure: LEFT HEART CATH AND CORONARY ANGIOGRAPHY;  Surgeon: Runell Gess, MD;  Location: [facility] INVASIVE CV LAB;  Service: Cardiovascular;  Laterality: N/A;  . LEFT HEART CATHETERIZATION WITH CORONARY ANGIOGRAM N/A 01/28/2012   Procedure: LEFT HEART CATHETERIZATION WITH CORONARY ANGIOGRAM;  Surgeon: Iran Ouch, MD;  Location: [facility] CATH LAB;  Service: Cardiovascular;  Laterality: N/A;  . LEFT HEART CATHETERIZATION WITH CORONARY ANGIOGRAM N/A 03/17/2014   Procedure: LEFT HEART CATHETERIZATION WITH CORONARY ANGIOGRAM;  Surgeon: Kathleene Hazel, MD;  Location: [facility] CATH LAB;  Service: Cardiovascular;  Laterality: N/A;  . ORIF TIBIA & FIBULA FRACTURES    . PTCA    . SHOULDER ARTHROSCOPY WITH DISTAL CLAVICLE RESECTION Left 12/16/2018   Procedure: LEFT SHOULDER ARTHROSCOPY WITH DISTAL CLAVICLE RESECTION;  Surgeon: Bjorn Pippin, MD;  Location: [facility];  Service: Orthopedics;  Laterality: Left;  . SHOULDER ARTHROSCOPY WITH ROTATOR CUFF REPAIR AND SUBACROMIAL DECOMPRESSION Left 12/16/2018   Procedure: LEFT SHOULDER ARTHROSCOPY WITH ROTATOR CUFF REPAIR AND SUBACROMIAL DECOMPRESSION, DEBRIDEMENT;  Surgeon: Bjorn Pippin, MD;  Location: [facility];  Service: Orthopedics;  Laterality: Left;  . SHOULDER ARTHROSCOPY WITH SUBACROMIAL DECOMPRESSION AND BICEP TENDON REPAIR Left 12/16/2018   Procedure: SHOULDER ARTHROSCOPY WITH SUBACROMIAL DECOMPRESSION AND BICEP TENDON REPAIR;  Surgeon: Bjorn Pippin, MD;  Location: [facility];  Service: Orthopedics;  Laterality: Left;  Marland Kitchen VASECTOMY         Family History  Problem Relation Age of Onset  . Asthma Mother   . Allergies Sister   . Coronary artery disease Other   . Colon cancer Neg Hx    . Esophageal cancer Neg Hx   . Stomach cancer Neg Hx   . Rectal cancer Neg Hx     Social History   Tobacco Use  . Smoking status: Never Smoker  . Smokeless tobacco: Never Used  Vaping Use  . Vaping Use: Never used  Substance Use Topics  . Alcohol use: Yes    Alcohol/week: 0.0 standard drinks    Comment: occ, once every 2 months  . Drug use: No    Home Medications Prior to Admission medications   Medication Sig Start Date End Date Taking? Authorizing Provider  predniSONE (DELTASONE) 20 MG tablet Take 2 tablets (40 mg total) by mouth daily. Start tomorrow 11/13/20 11/12/20  Yes Gwyneth Sprout, MD  Albuterol (PROVENTIL IN) Inhale into the lungs.    [provider]  albuterol (VENTOLIN HFA) 108 (90 Base) MCG/ACT inhaler Inhale 2 puffs into the lungs every 4 (four) hours as needed for wheezing or shortness of breath. 07/31/20   Kozlow, Alvira Philips, MD  ALPRAZolam Prudy Feeler) 0.25 MG tablet TAKE 1 TABLET BY MOUTH AT BEDTIME AS NEEDED FOR SLEEP 10/18/20  Philip AspenHernandez Acosta, Limmie PatriciaEstela Y, MD  aspirin EC 81 MG tablet Take 81 mg by mouth daily.    [provider]  atorvastatin (LIPITOR) 40 MG tablet Take 1 tablet (40 mg total) by mouth daily. 10/11/19   Kroeger, Ovidio KinKrista M., PA-C  Budeson-Glycopyrrol-Formoterol (BREZTRI AEROSPHERE) 160-9-4.8 MCG/ACT AERO 2 puffs 1-2 times per day with spacer. 07/31/20   Kozlow, Alvira PhilipsEric J, MD  carvedilol (COREG) 6.25 MG tablet Take 1 tablet (6.25 mg total) by mouth 2 (two) times daily with a meal. 01/18/20   Philip AspenHernandez Acosta, Limmie PatriciaEstela Y, MD  Cholecalciferol (VITAMIN D3 PO) Take 1 tablet by mouth daily.    [provider]  Cyanocobalamin (B-12 PO) Take 1 tablet by mouth daily.    [provider]  ferrous sulfate 325 (65 FE) MG EC tablet Take 325 mg by mouth daily.     [provider]  fluticasone (FLONASE) 50 MCG/ACT nasal spray USE 1 TO 2 SPRAY(S) IN EACH NOSTRIL ONCE DAILY AS DIRECTED 07/31/20   Kozlow, Alvira PhilipsEric J, MD  fluticasone (FLOVENT HFA)  110 MCG/ACT inhaler Inhale 3 puffs into the lungs in the morning, at noon, and at bedtime. 07/31/20   Kozlow, Alvira PhilipsEric J, MD  furosemide (LASIX) 20 MG tablet Take 20 mg by mouth.     [provider]  ipratropium (ATROVENT) 0.06 % nasal spray 2 sprays each nostril every 6 hours. 07/31/20   Kozlow, Alvira PhilipsEric J, MD  isosorbide mononitrate (IMDUR) 30 MG 24 hr tablet TAKE ONE TABLET BY MOUTH TWICE DAILY 06/30/16   Gordy SaversKwiatkowski, Peter F, MD  levothyroxine (SYNTHROID) 100 MCG tablet Take 1 tablet by mouth once daily 06/19/20   Philip AspenHernandez Acosta, Limmie PatriciaEstela Y, MD  metFORMIN (GLUCOPHAGE) 500 MG tablet TAKE 1 TABLET BY MOUTH TWICE DAILY WITH A MEAL 12/19/19   Philip AspenHernandez Acosta, Limmie PatriciaEstela Y, MD  nitroGLYCERIN (NITROSTAT) 0.4 MG SL tablet Place 1 tablet (0.4 mg total) under the tongue every 5 (five) minutes x 3 doses as needed for chest pain. 10/11/19   Kroeger, Ovidio KinKrista M., PA-C  olopatadine (PATANOL) 0.1 % ophthalmic solution Place 2 drops into both eyes 2 (two) times daily. 07/31/20   Kozlow, Alvira PhilipsEric J, MD  ondansetron (ZOFRAN) 8 MG tablet Take 8 mg by mouth every 8 (eight) hours as needed for nausea or vomiting.     [provider]  pantoprazole (PROTONIX) 40 MG tablet Take 1 tablet (40 mg total) by mouth daily. Patient taking differently: Take 40 mg by mouth 2 (two) times daily.  07/19/19   Kozlow, Alvira PhilipsEric J, MD  pregabalin (LYRICA) 75 MG capsule Take 1 capsule by mouth twice daily 09/19/20   Philip AspenHernandez Acosta, Limmie PatriciaEstela Y, MD  tamsulosin [facility](FLOMAX) 0.4 MG CAPS capsule TAKE ONE CAPSULE BY MOUTH DAILY 06/26/14   Gordy SaversKwiatkowski, Peter F, MD    Allergies    Trazodone and nefazodone and Meloxicam  Review of Systems   Review of Systems  Constitutional: Negative for chills and fever.  HENT: Negative for congestion and facial swelling.   Eyes: Negative for discharge and visual disturbance.  Respiratory: Positive for shortness of breath.   Cardiovascular: Positive for leg swelling. Negative for chest pain and palpitations.   Gastrointestinal: Negative for abdominal pain, diarrhea and vomiting.  Musculoskeletal: Negative for arthralgias and myalgias.  Skin: Negative for color change and rash.  Neurological: Negative for tremors, syncope and headaches.  Psychiatric/Behavioral: Negative for confusion and dysphoric mood.    Physical Exam Updated Vital Signs BP (!) 159/86   Pulse 74   Temp  97.9 F (36.6 C) (Temporal)   Resp 19   Ht 5' 10.5" (1.791 m)   Wt 97 kg   SpO2 95%   BMI 30.25 kg/m   Physical Exam Vitals and nursing note reviewed.  Constitutional:      Appearance: He is well-developed.  HENT:     Head: Normocephalic and atraumatic.  Eyes:     Pupils: Pupils are equal, round, and reactive to light.  Neck:     Vascular: No JVD.     Comments: JVP to mid neck Cardiovascular:     Rate and Rhythm: Normal rate and regular rhythm.     Heart sounds: No murmur heard. No friction rub. No gallop.   Pulmonary:     Effort: No respiratory distress.     Breath sounds: No wheezing.  Abdominal:     General: There is no distension.     Tenderness: There is no guarding or rebound.  Musculoskeletal:        General: Normal range of motion.     Cervical back: Normal range of motion and neck supple.     Right lower leg: No tenderness. Edema present.     Left lower leg: No tenderness. Edema present.     Comments: Trace edema bilaterally   Skin:    Coloration: Skin is not pale.     Findings: No rash.  Neurological:     Mental Status: He is alert and oriented to person, place, and time.  Psychiatric:        Behavior: Behavior normal.     ED Results / Procedures / Treatments   Labs (all labs ordered are listed, but only abnormal results are displayed) Labs Reviewed  BASIC METABOLIC PANEL - Abnormal; Notable for the following components:      Result Value   CO2 20 (*)    Glucose, Bld 113 (*)    Creatinine, Ser 1.44 (*)    GFR, Estimated 49 (*)    All other components within normal limits  CBC  - Abnormal; Notable for the following components:   RBC 3.76 (*)    Hemoglobin 12.8 (*)    HCT 38.6 (*)    MCV 102.7 (*)    All other components within normal limits  D-DIMER, QUANTITATIVE - Abnormal; Notable for the following components:   D-Dimer, Quant 0.87 (*)    All other components within normal limits  I-STAT VENOUS BLOOD GAS, ED - Abnormal; Notable for the following components:   pCO2, Ven 37.3 (*)    pO2, Ven 46.0 (*)    HCT 34.0 (*)    Hemoglobin 11.6 (*)    All other components within normal limits  RESP PANEL BY RT-PCR (FLU A&B, COVID) ARPGX2  BRAIN NATRIURETIC PEPTIDE  TROPONIN I (HIGH SENSITIVITY)  TROPONIN I (HIGH SENSITIVITY)    EKG EKG Interpretation  Date/Time:  Monday November 12 2020 04:32:12 EDT Ventricular Rate:  77 PR Interval:  208 QRS Duration: 108 QT Interval:  366 QTC Calculation: 415 R Axis:   -21 Text Interpretation: Sinus rhythm Multiple ventricular premature complexes Consider left atrial enlargement Inferior infarct, old Anterior infarct, old No significant change since last tracing Confirmed by Melene Plan [id number]) on 11/12/2020 4:59:50 AM Also confirmed by Melene Plan [phone]), editor Elita Quick [id number])  on 11/12/2020 11:24:38 AM   Radiology DG Chest 2 View  Result Date: 11/12/2020 CLINICAL DATA:  Chest pain. EXAM: CHEST - 2 VIEW.  Patient is slightly rotated. COMPARISON:  Chest x-ray 10/08/2019, CT  chest 11/17/2018 FINDINGS: The heart size and mediastinal contours are unchanged. Aortic calcification. Left base streaky airspace opacity likely represents atelectasis. Right base atelectasis and patient rotation. No focal consolidation. No pulmonary edema. No pleural effusion. No pneumothorax. No acute osseous abnormality. IMPRESSION: No active cardiopulmonary disease in a patient with bibasilar atelectasis. Electronically Signed   By: Tish Frederickson M.D.   On: 11/12/2020 05:50   CT Angio Chest PE W and/or Wo Contrast  Result Date:  11/12/2020 CLINICAL DATA:  80 year old male with history of shortness of breath. EXAM: CT ANGIOGRAPHY CHEST WITH CONTRAST TECHNIQUE: Multidetector CT imaging of the chest was performed using the standard protocol during bolus administration of intravenous contrast. Multiplanar CT image reconstructions and MIPs were obtained to evaluate the vascular anatomy. CONTRAST:  Sixty-six mL Omnipaque 350, intravenous COMPARISON:  12/17/2018 FINDINGS: Cardiovascular: Satisfactory opacification of the pulmonary arteries to the segmental level. No evidence of pulmonary embolism. Similar appearing mild global cardiomegaly. No pericardial effusion. Mediastinum/Nodes: No enlarged mediastinal, hilar, or axillary lymph nodes. Thyroid gland, trachea, and esophagus demonstrate no significant findings. Lungs/Pleura: Bibasilar cicatricial atelectasis. No focal consolidations. No suspicious pulmonary nodules. No pleural effusion or pneumothorax. Upper Abdomen: Status post cholecystectomy. The remaining visualized upper abdomen is within normal limits. Musculoskeletal: No chest wall abnormality. Multilevel degenerative changes of the thoracic spine. No acute or significant osseous findings. Review of the MIP images confirms the above findings. IMPRESSION: Vascular: 1. No evidence of pulmonary embolism. 2. Stable mild global cardiomegaly. Non-Vascular: No acute intrathoracic abnormality. Bibasilar cicatricial atelectasis. Marliss Coots, MD [facility] Radiology Electronically Signed   By: Marliss Coots MD   On: 11/12/2020 07:55    Procedures Procedures   Medications Ordered in ED Medications  iohexol (OMNIPAQUE) 350 MG/ML injection 66 mL (66 mLs Intravenous Contrast Given 11/12/20 0705)  albuterol (PROVENTIL) (2.5 MG/3ML) 0.083% nebulizer solution 5 mg (5 mg Nebulization Given 11/12/20 1003)  ipratropium (ATROVENT) nebulizer solution 0.5 mg (0.5 mg Nebulization Given 11/12/20 1016)   ipratropium (ATROVENT HFA) inhaler 2 puff (2 puffs Inhalation Given 11/12/20 1111)  predniSONE (DELTASONE) tablet 60 mg (60 mg Oral Given 11/12/20 1111)    ED Course  I have reviewed the triage vital signs and the nursing notes.  Pertinent labs & imaging results that were available during my care of the patient were reviewed by me and considered in my medical decision making (see chart for details).    MDM Rules/Calculators/A&P                          80 yo M with a chief complaints of shortness of breath.  Tells me he feels like he is drowning.  Is been going on for the past 4 or 5 days now.  Clear lung sounds for me.  Trace edema.  Chest x-ray viewed by me without obvious etiology of his symptoms.  Troponin negative.  Will obtain a BNP D-dimer.  Ddimer elevated, will obtain CTA.   Signed out to Dr. Anitra Lauth, plan for reassessment ambulation post CT.   The patients results and plan were reviewed and discussed.   Any x-rays performed were independently reviewed by myself.   Differential diagnosis were considered with the presenting HPI.  Medications  iohexol (OMNIPAQUE) 350 MG/ML injection 66 mL (66 mLs Intravenous Contrast Given 11/12/20 0705)  albuterol (PROVENTIL) (2.5 MG/3ML) 0.083% nebulizer solution 5 mg (5 mg Nebulization Given 11/12/20 1003)  ipratropium (ATROVENT) nebulizer solution 0.5 mg (0.5 mg  Nebulization Given 11/12/20 1016)  ipratropium (ATROVENT HFA) inhaler 2 puff (2 puffs Inhalation Given 11/12/20 1111)  predniSONE (DELTASONE) tablet 60 mg (60 mg Oral Given 11/12/20 1111)    Vitals:   11/12/20 1000 11/12/20 1030 11/12/20 1100 11/12/20 1118  BP: 138/77 (!) 149/89 (!) 159/86   Pulse: 84 76 74   Resp: Temp:    97.9 F (36.6 C)  TempSrc:    Temporal  SpO2: 97% 97% 95%   Weight:      Height:        Final diagnoses:  Moderate persistent asthma with exacerbation       Final Clinical Impression(s) / ED Diagnoses Final diagnoses:  Moderate persistent  asthma with exacerbation    Rx / DC Orders ED Discharge Orders         Ordered    predniSONE (DELTASONE) 20 MG tablet  Daily        11/12/20 1111           Melene Plan, DO 11/12/20 2304

## 2020-11-12 NOTE — ED Provider Notes (Addendum)
Patient's delta troponin is within normal limits at 8 with prior 1 being 9.  Creatinine is at baseline at 1.44.  CTA shows stable mild global cardiomegaly and no evidence for PE.  There is no signs of pneumonia or pulmonary edema on CT.  On repeat evaluation patient does have occasional PVCs but normal heart rate and blood pressure.  Oxygen saturation is 96% on room air.  Patient is complaining of feeling continually short of breath but seems to be worse with exertion.  Low suspicion at this time for CHF, however concern for possible COVID as patient does work at the [employer] several times a week.  Also could be asthma exacerbation.  Will give albuterol, Atrovent and then ambulate the patient to evaluate pulse ox.  He has reported a headache intermittently and will check a VBG. 10:13 AM COVID neg.  VBG reassuring without significant resp acidosis.  Will give neb and re-evaluate.  10:56 AM Pt feeling much better after nebs.  Suspect sx are related to his asthma.  He is otherwise well appearing.  Given prednisone and atrovent to go home with.  He has plenty of albuterol.  Pt ambulated without desating and was 93% or greater on RA and HR up to 90 and he was able to speak while walking.  Will d/c home with return precautions.   Gwyneth Sprout, MD 11/12/20 1057    Gwyneth Sprout, MD 11/12/20 1114

## 2020-11-13 ENCOUNTER — Encounter: Payer: Self-pay | Admitting: Allergy and Immunology

## 2020-11-13 ENCOUNTER — Ambulatory Visit: Payer: Medicare Other | Admitting: Internal Medicine

## 2020-11-13 ENCOUNTER — Encounter: Payer: Self-pay | Admitting: Internal Medicine

## 2020-11-14 ENCOUNTER — Other Ambulatory Visit: Payer: Self-pay

## 2020-11-14 MED ORDER — ATROVENT HFA 17 MCG/ACT IN AERS: 2.0000 | INHALATION_SPRAY | RESPIRATORY_TRACT | 1 refills | Status: DC | PRN

## 2020-11-14 NOTE — Telephone Encounter (Signed)
Spoke with patient over the phone, informed him of Dr. Kathyrn Lass recommendation. He ask that we print the prescription so he can take it to the [place]. He stated they wont take prescriptions electronically. I informed patient that Dr. Lucie Leather would need to sign the printed prescription however he is not in the [place] office until Tuesday 11/20/2020. I ask patient if he had enough to last until then, he stated that he did and it would be fine to wait until Tuesday. Printed prescription is being sent to [place] by Apolonio Schneiders and will place it on Dr. Kathyrn Lass desk to sign on Tuesday 11/20/2020. Once signed please call patient when prescription is ready for pick up.

## 2020-11-15 ENCOUNTER — Telehealth: Payer: Self-pay | Admitting: Cardiovascular Disease

## 2020-11-15 NOTE — Telephone Encounter (Signed)
  Patient c/o Palpitations:  High priority if patient c/o lightheadedness, shortness of breath, or chest pain  How long have you had palpitations/irregular HR/ Afib? Are you having the symptoms now?  Irregular heart rate- started early  Monday morning- went by ambulance to ER - said he halso had a severe asthma attack  Are you currently experiencing lightheadedness, SOB or CP? Lightheaded and sob, but not at this time  Do you have a history of afib (atrial fibrillation) or irregular heart rhythm?   Have you checked your BP or HR? (document readings if available):  119/68 today and heart rate 59  Are you experiencing any other symptoms?  Chest is sore- pt wants to be seen- no avability until in July

## 2020-11-15 NOTE — Telephone Encounter (Signed)
Started last Tues and progressed until this past Mon 3am woke up felt like he was drowniing.  Called EMS and was treated there.    Dx w severe asthma attack.  Had not had one in 65 years.  Has home inhalers.   Feeling pretty good now.   He wants Dr. Clifton James to review his EMS EKG.  There were PVCs which he said worries EMS.  He is feeling really well but will put a copy of it in the mail for Dr. Clifton James to review.   I scheduled his yearly f/u for October.

## 2020-11-20 NOTE — Telephone Encounter (Signed)
Left a message informing patient that his prescription is ready for pick up.

## 2020-11-29 ENCOUNTER — Encounter: Payer: Self-pay | Admitting: Internal Medicine

## 2020-12-04 ENCOUNTER — Other Ambulatory Visit: Payer: Self-pay | Admitting: Internal Medicine

## 2020-12-04 DIAGNOSIS — E039 Hypothyroidism, unspecified: Secondary | ICD-10-CM

## 2020-12-13 DIAGNOSIS — N400 Enlarged prostate without lower urinary tract symptoms: Secondary | ICD-10-CM | POA: Diagnosis not present

## 2020-12-19 ENCOUNTER — Other Ambulatory Visit: Payer: Self-pay | Admitting: Internal Medicine

## 2020-12-19 DIAGNOSIS — E1142 Type 2 diabetes mellitus with diabetic polyneuropathy: Secondary | ICD-10-CM

## 2021-01-24 IMAGING — CT CT ANGIOGRAPHY CHEST
2 of 6 series · 18 of 46 positions shown · IV contrast (OMNIPAQUE)
Comparison: Chest x-ray December 17, 2018.

CLINICAL DATA: Chest pain and shortness of breath since last night.

EXAM:
CT ANGIOGRAPHY CHEST WITH CONTRAST
TECHNIQUE: Multidetector CT imaging of the chest was performed using the
standard protocol during bolus administration of intravenous
contrast. Multiplanar CT image reconstructions and MIPs were
obtained to evaluate the vascular anatomy.
CONTRAST:  75mL OMNIPAQUE IOHEXOL 350 MG/ML SOLN

[Series 5: thins · axial · 0.71mm/px · z∈[-334,-79]mm · 15 of 281 slices shown]
[im 13/281  lung]
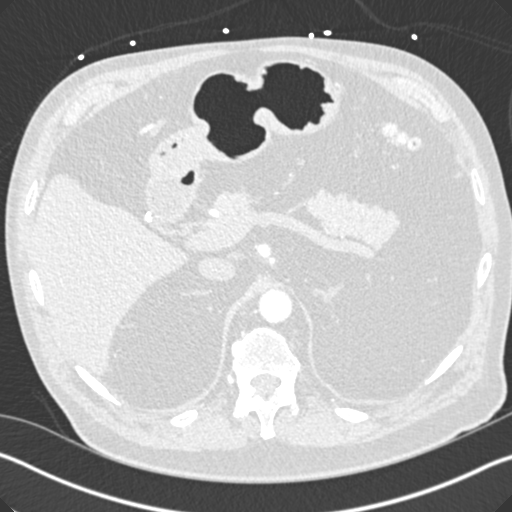
[im 37/281  soft-tissue]
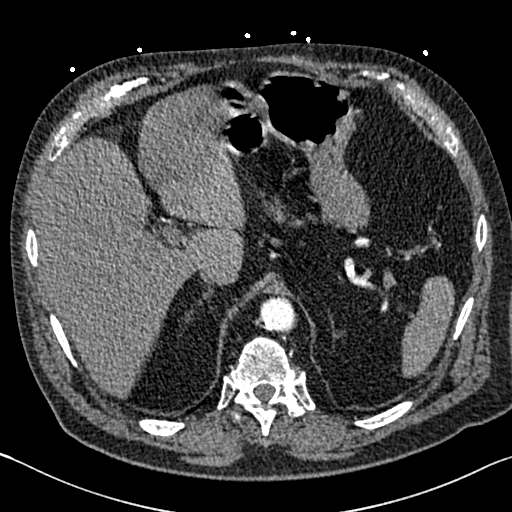
[im 49/281  lung]
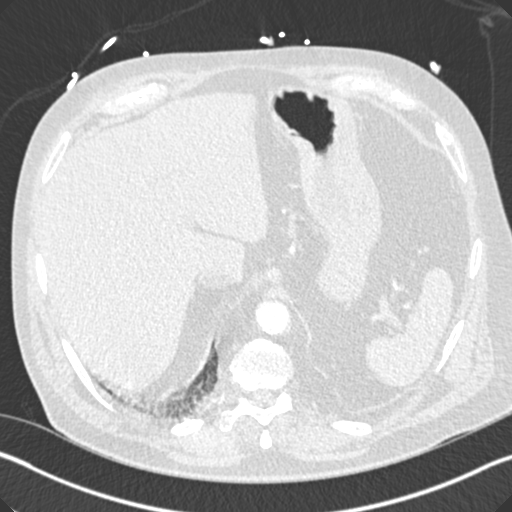
[im 74/281  soft-tissue]
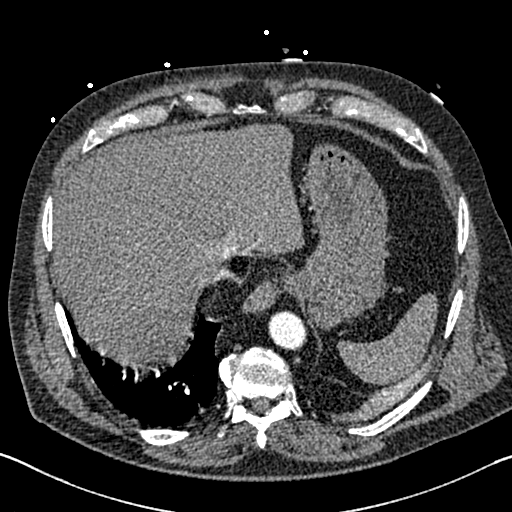
[im 86/281  lung]
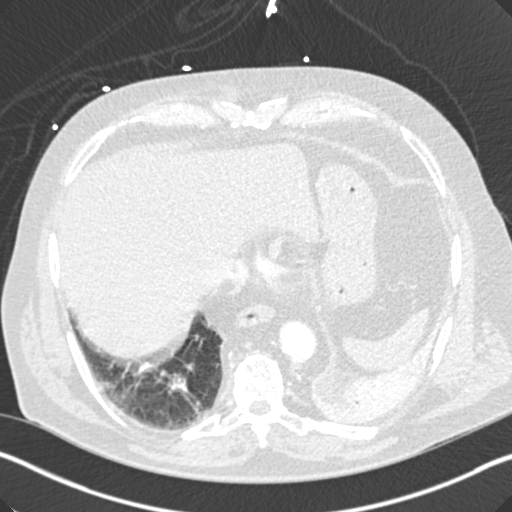
[im 110/281  soft-tissue]
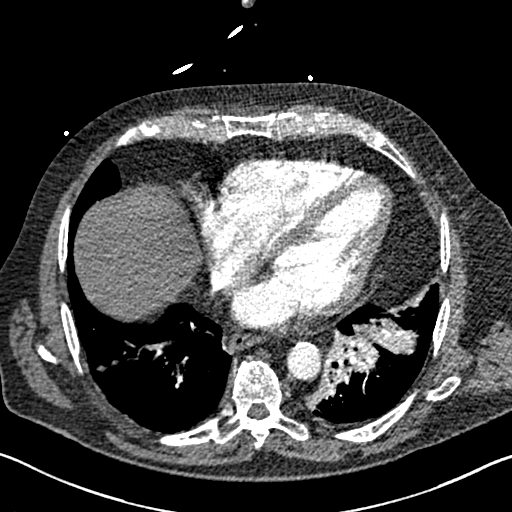
[im 122/281  lung]
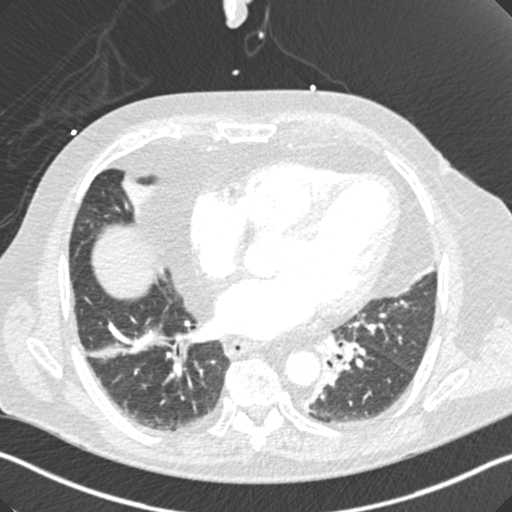
[im 147/281  soft-tissue]
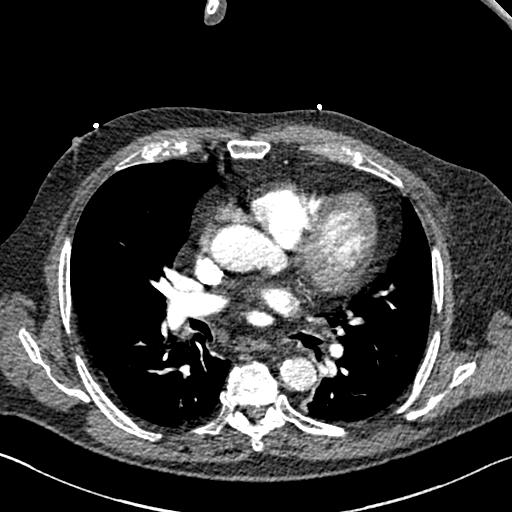
[im 159/281  lung]
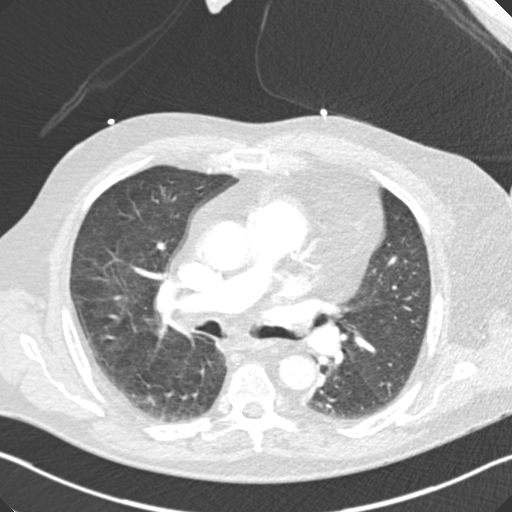
[im 171/281  soft-tissue]
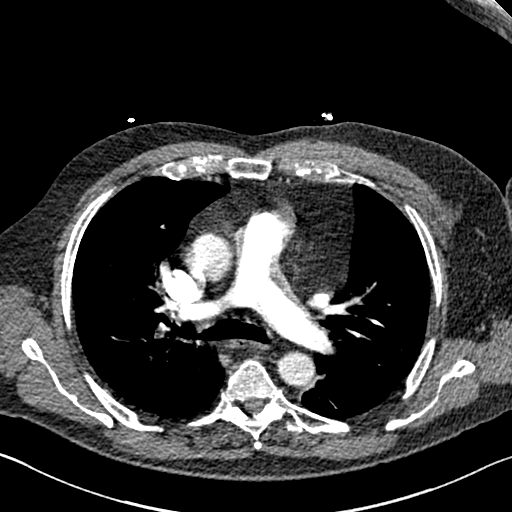
[im 195/281  lung]
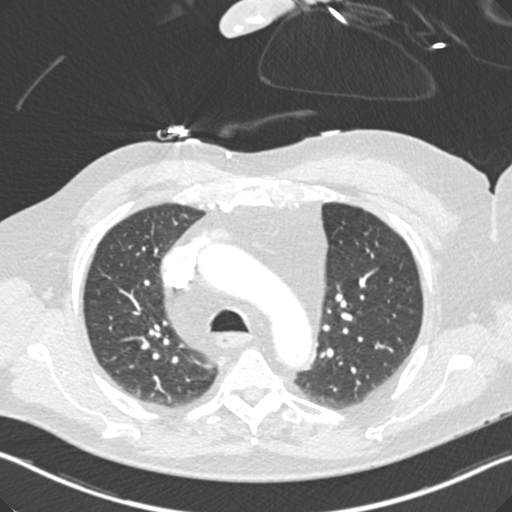
[im 207/281  soft-tissue]
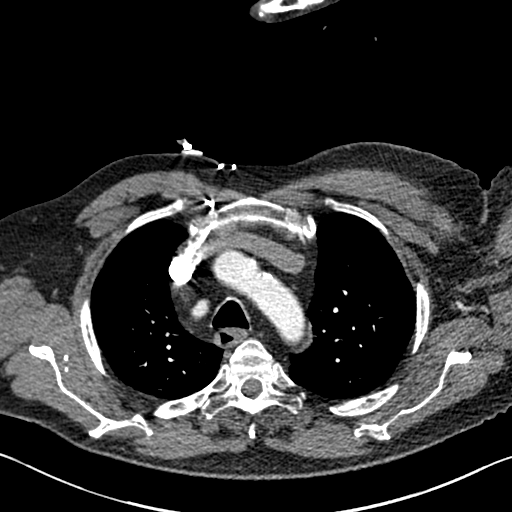
[im 232/281  lung]
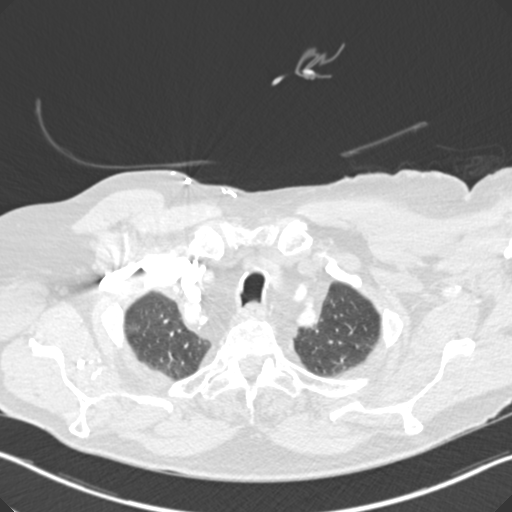
[im 244/281  soft-tissue]
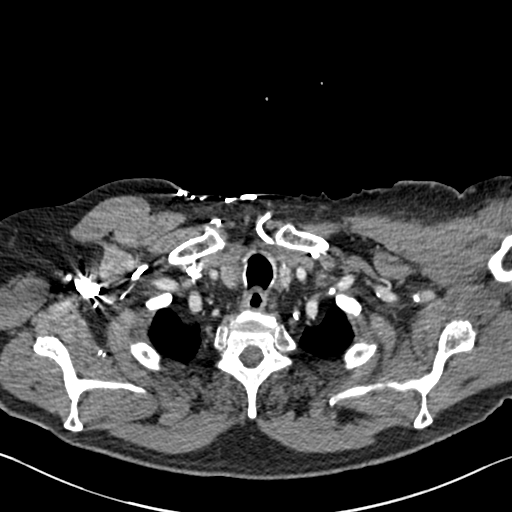
[im 268/281  lung]
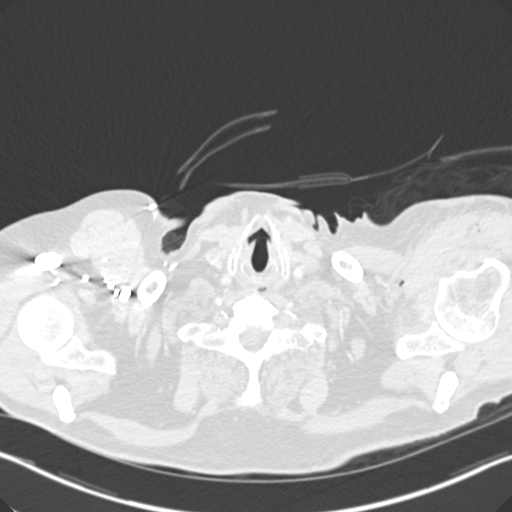

[Series 7: coronal mpr · coronal · 0.58mm/px · 3 of 153 slices shown]
[im 39/153  soft-tissue]
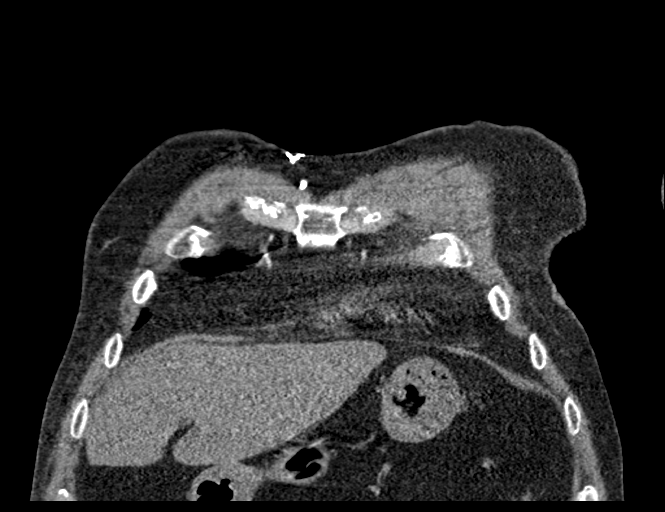
[im 77/153  soft-tissue]
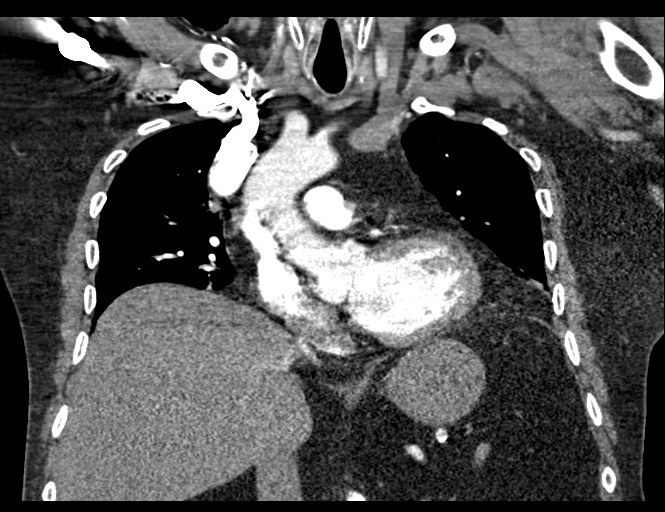
[im 115/153  soft-tissue]
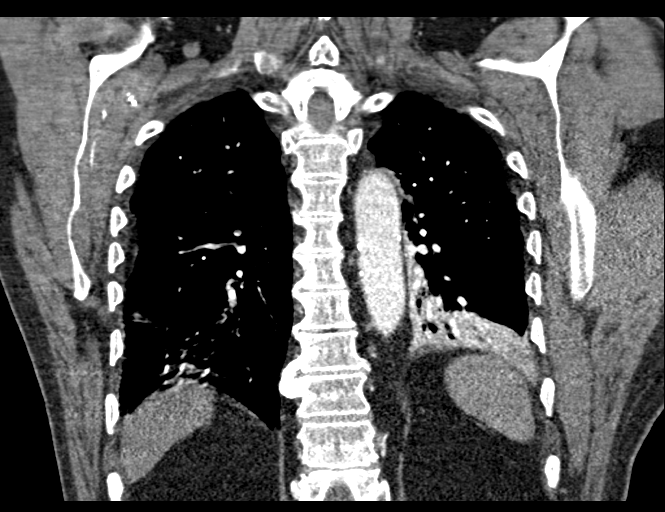

[18 of 46 positions shown; findings below may reference images not displayed]

FINDINGS: Cardiovascular: Satisfactory opacification of the pulmonary arteries
to the segmental level. No evidence of pulmonary embolism. The heart
size is enlarged. No pericardial effusion. Atherosclerosis of the
aorta is noted.

Mediastinum/Nodes: No enlarged mediastinal, hilar, or axillary lymph
nodes. Thyroid gland, trachea, and esophagus demonstrate no
significant findings.

Lungs/Pleura: Consolidation of the left lower lobe with volume loss
is identified. There is also consolidation of the posteroinferior
left upper lobe. Mild linear atelectasis of the right lung base is
noted. Minimal bilateral pleural effusions are identified.

Upper Abdomen: Diffuse low density of the liver is identified
without focal liver lesion. There is a small hiatal hernia. Patient
status post prior cholecystectomy.

Musculoskeletal: Degenerative joint changes of the spine are
identified.

Review of the MIP images confirms the above findings.
IMPRESSION: No pulmonary embolus.

Consolidation of the left lower lobe with volume loss and
consolidation of the posteroinferior left upper lobe at least in
part due to atelectasis. Superimposed pneumonia is not excluded.

Fatty infiltration of liver.

## 2021-01-29 ENCOUNTER — Encounter: Payer: Self-pay | Admitting: Allergy and Immunology

## 2021-01-29 ENCOUNTER — Other Ambulatory Visit: Payer: Self-pay

## 2021-01-29 ENCOUNTER — Ambulatory Visit: Payer: Medicare Other | Admitting: Allergy and Immunology

## 2021-01-29 VITALS — BP 130/70 | HR 90 | Temp 96.4°F | Resp 16 | Ht 70.0 in | Wt 216.1 lb

## 2021-01-29 DIAGNOSIS — J3089 Other allergic rhinitis: Secondary | ICD-10-CM

## 2021-01-29 DIAGNOSIS — J449 Chronic obstructive pulmonary disease, unspecified: Secondary | ICD-10-CM

## 2021-01-29 DIAGNOSIS — K219 Gastro-esophageal reflux disease without esophagitis: Secondary | ICD-10-CM

## 2021-01-29 MED ORDER — IPRATROPIUM BROMIDE 0.06 % NA SOLN: NASAL | 5 refills | Status: DC

## 2021-01-29 MED ORDER — OLOPATADINE HCL 0.1 % OP SOLN: 2.0000 [drp] | Freq: Two times a day (BID) | OPHTHALMIC | 5 refills | Status: DC

## 2021-01-29 MED ORDER — ATROVENT HFA 17 MCG/ACT IN AERS
2.0000 | INHALATION_SPRAY | RESPIRATORY_TRACT | 1 refills | Status: DC | PRN
Start: 2021-01-29 — End: 2022-09-02

## 2021-01-29 MED ORDER — FLUTICASONE PROPIONATE 50 MCG/ACT NA SUSP: NASAL | 1 refills | Status: DC

## 2021-01-29 MED ORDER — ALBUTEROL SULFATE HFA 108 (90 BASE) MCG/ACT IN AERS: 2.0000 | INHALATION_SPRAY | RESPIRATORY_TRACT | 3 refills | Status: DC | PRN

## 2021-01-29 MED ORDER — BREZTRI AEROSPHERE 160-9-4.8 MCG/ACT IN AERO: INHALATION_SPRAY | RESPIRATORY_TRACT | 5 refills | Status: DC

## 2021-01-29 MED ORDER — ATROVENT HFA 17 MCG/ACT IN AERS: 2.0000 | INHALATION_SPRAY | RESPIRATORY_TRACT | 1 refills | Status: DC | PRN

## 2021-01-29 NOTE — Patient Instructions (Addendum)
  1. Continue to Perform Allergen avoidance measures - dust mite, pollens, molds  2. Continue to Treat and prevent inflammation:   A. Breztri - 2 inhalations 1-2 times per day with spacer  B. Flonase - 1 spray each nostril 2 times per day  3. If needed:   A. Albuterol HFA 2 puffs every 4-6 hours  B. Atrovent - 2 puffs every 6 hours  C. OTC antihistamine - Claritin/Zyrtec/Allegra  D. nasal ipratropium 0.06% 2 sprays each nostril every 6 hours  E. Pataday - 1 drop each eye 1 time per day  4. "Action plan" for asthma flare up:   A. continue Breztri  B. add Flovent 110 3 inhalations 3 times a day with spacer  C. use Proventil or Ventolin HFA if needed  5. Continue treatment for reflux with Protonix 40 mg 1-2 times a day   6. Return to clinic in 6 months or earlier if problem  7. Obtain fall flu vaccine

## 2021-01-29 NOTE — Progress Notes (Signed)
[place] - [place] - [place] - [place] - [name]   Follow-up Note  Referring Provider: Philip Aspen, Almira Bar* Primary Provider: Philip Aspen, Limmie Patricia, MD Date of Office Visit: 01/29/2021  Subjective:   SHAD LEDVINA (DOB: Dec 16, 1940) is a 80 y.o. male who [facility] in re-evaluation of the following:  HPI: Davius returns to this clinic in reevaluation of COPD/asthma overlap, allergic rhinoconjunctivitis, and reflux.  His last visit to this clinic was 31 July 2020.  He had really done quite well up until the end of June 2022 at which point in time he had acute onset of dyspnea and shortness of breath and just could not breathe at all.  He went to the emergency room and was evaluated and treated with albuterol which did not help him but when he was given Atrovent he had miraculous resolution of his symptoms and has done well since.  He was given a course of steroids with Ativan.  He does not really note any obvious provoking factor that gave rise to that issue.  Other than that one event he rarely has any difficulty with his breathing and he can exert himself to the extent that he so desires and he has not had the need to use a short acting bronchodilator with albuterol or Atrovent more than just 1 time per day.  And his nose has really been doing very well while using some occasional nasal steroid.  His reflux is under pretty good control at this point in time on his proton pump inhibitor.  He is received 4 COVID vaccines.  Allergies as of 01/29/2021       Reactions   Meloxicam Other (See Comments), Anxiety   "jumping out of my skin" Pt feels anxiety attacks when on this medicine   Trazodone And Nefazodone    "makes me freak out"        Medication List    albuterol 108 (90 Base) MCG/ACT inhaler Commonly known as: VENTOLIN HFA Inhale 2 puffs into the lungs every 4 (four) hours as needed for wheezing or  shortness of breath.   ALPRAZolam 0.25 MG tablet Commonly known as: XANAX TAKE 1 TABLET BY MOUTH AT BEDTIME AS NEEDED FOR SLEEP   aspirin EC 81 MG tablet Take 81 mg by mouth daily.   atorvastatin 40 MG tablet Commonly known as: LIPITOR Take 1 tablet (40 mg total) by mouth daily.   Atrovent HFA 17 MCG/ACT inhaler Generic drug: ipratropium Inhale 2 puffs into the lungs every 4 (four) hours as needed for wheezing.   B-12 PO Take 1 tablet by mouth daily.   Breztri Aerosphere 160-9-4.8 MCG/ACT Aero Generic drug: Budeson-Glycopyrrol-Formoterol 2 puffs 1-2 times per day with spacer.   carvedilol 6.25 MG tablet Commonly known as: COREG Take 1 tablet (6.25 mg total) by mouth 2 (two) times daily with a meal.   Euthyrox 100 MCG tablet Generic drug: levothyroxine Take 1 tablet by mouth once daily   ferrous sulfate 325 (65 FE) MG EC tablet Take 325 mg by mouth daily.   Flovent HFA 110 MCG/ACT inhaler Generic drug: fluticasone Inhale 3 puffs into the lungs in the morning, at noon, and at bedtime.   fluticasone 50 MCG/ACT nasal spray Commonly known as: FLONASE USE 1 TO 2 SPRAY(S) IN EACH NOSTRIL ONCE DAILY AS DIRECTED   furosemide 20 MG tablet Commonly known as: LASIX Take 20 mg by mouth.   ipratropium 0.06 % nasal spray Commonly known  as: ATROVENT 2 sprays each nostril every 6 hours.   isosorbide mononitrate 30 MG 24 hr tablet Commonly known as: IMDUR TAKE ONE TABLET BY MOUTH TWICE DAILY   nitroGLYCERIN 0.4 MG SL tablet Commonly known as: NITROSTAT Place 1 tablet (0.4 mg total) under the tongue every 5 (five) minutes x 3 doses as needed for chest pain.   olopatadine 0.1 % ophthalmic solution Commonly known as: PATANOL Place 2 drops into both eyes 2 (two) times daily.   ondansetron 8 MG tablet Commonly known as: ZOFRAN Take 8 mg by mouth every 8 (eight) hours as needed for nausea or vomiting.   pantoprazole 40 MG tablet Commonly known as: PROTONIX Take 1 tablet  (40 mg total) by mouth daily. What changed: when to take this   pregabalin 75 MG capsule Commonly known as: LYRICA Take 1 capsule by mouth twice daily   PROVENTIL IN Inhale into the lungs.   tamsulosin 0.4 MG Caps capsule Commonly known as: FLOMAX TAKE ONE CAPSULE BY MOUTH DAILY   VITAMIN D3 PO Take 1 tablet by mouth daily.    Past Medical History:  Diagnosis Date   Anemia    Asthma    Cataract    CKD (chronic kidney disease), stage III (HCC)    Coronary artery disease 1991   a) Angioplasty LAD 1991 [place] b) MI in 1995 at  [facility] in [place], med Rx c) Cath 10/2019 for NSTEMI - see report -> med rx.   Diabetes mellitus    GERD (gastroesophageal reflux disease)    Hearing loss    HTN (hypertension)    Hyperlipidemia    Hypothyroidism    Myocardial infarct (HCC)    Nephrolithiasis    Osteopenia    Premature atrial contractions    PVC's (premature ventricular contractions)    Snake bite     Past Surgical History:  Procedure Laterality Date   ANGIOPLASTY  1991   CARDIAC CATHETERIZATION  2005   Moderate LCx and LAD disease and severe diffuse RCA disease   CARDIAC CATHETERIZATION  01/2012   normal left main, 50% pLAD stenosis, dLAD mild luminal irregularities, D1 30-40% stenosis at take off; subtotally occluded/severely diseased OM1, mid 80% stenosis in small OM2, long prox and mid RCA stenoses ranging from 60-95%, severely diseased PDA, distal PLSA occluded filling via L-R collateral; normal LV function; inferior lateral basal akinesis.   CHOLECYSTECTOMY  2012   EYE SURGERY  2010   cataract - right, has left done too   INGUINAL HERNIA REPAIR     INTRAVASCULAR PRESSURE WIRE/FFR STUDY N/A 10/10/2019   Procedure: INTRAVASCULAR PRESSURE WIRE/FFR STUDY;  Surgeon: Runell Gess, MD;  [facility];  Service: Cardiovascular;  Laterality: N/A;   LEFT HEART CATH AND CORONARY ANGIOGRAPHY N/A 10/10/2019   Procedure: LEFT HEART CATH AND CORONARY  ANGIOGRAPHY;  Surgeon: Runell Gess, MD;  [facility];  Service: Cardiovascular;  Laterality: N/A;   LEFT HEART CATHETERIZATION WITH CORONARY ANGIOGRAM N/A 01/28/2012   Procedure: LEFT HEART CATHETERIZATION WITH CORONARY ANGIOGRAM;  Surgeon: Iran Ouch, MD;  [facility];  Service: Cardiovascular;  Laterality: N/A;   LEFT HEART CATHETERIZATION WITH CORONARY ANGIOGRAM N/A 03/17/2014   Procedure: LEFT HEART CATHETERIZATION WITH CORONARY ANGIOGRAM;  Surgeon: Kathleene Hazel, MD;  Location: [facility] CATH LAB;  Service: Cardiovascular;  Laterality: N/A;   ORIF TIBIA & FIBULA FRACTURES     PTCA     SHOULDER ARTHROSCOPY WITH DISTAL CLAVICLE  RESECTION Left 12/16/2018   Procedure: LEFT SHOULDER ARTHROSCOPY WITH DISTAL CLAVICLE RESECTION;  Surgeon: Bjorn Pippin, MD;  Location: [facility];  Service: Orthopedics;  Laterality: Left;   SHOULDER ARTHROSCOPY WITH ROTATOR CUFF REPAIR AND SUBACROMIAL DECOMPRESSION Left 12/16/2018   Procedure: LEFT SHOULDER ARTHROSCOPY WITH ROTATOR CUFF REPAIR AND SUBACROMIAL DECOMPRESSION, DEBRIDEMENT;  Surgeon: Bjorn Pippin, MD;  Location: [facility];  Service: Orthopedics;  Laterality: Left;   SHOULDER ARTHROSCOPY WITH SUBACROMIAL DECOMPRESSION AND BICEP TENDON REPAIR Left 12/16/2018   Procedure: SHOULDER ARTHROSCOPY WITH SUBACROMIAL DECOMPRESSION AND BICEP TENDON REPAIR;  Surgeon: Bjorn Pippin, MD;  Location: [facility];  Service: Orthopedics;  Laterality: Left;   VASECTOMY      Review of systems negative except as noted in HPI / PMHx or noted below:  Review of Systems  Constitutional: Negative.   HENT: Negative.    Eyes: Negative.   Respiratory: Negative.    Cardiovascular: Negative.   Gastrointestinal: Negative.   Genitourinary: Negative.   Musculoskeletal: Negative.   Skin: Negative.   Neurological: Negative.   Endo/Heme/Allergies: Negative.   Psychiatric/Behavioral: Negative.       Objective:   Vitals:   01/29/21 1336  BP: 130/70  Pulse: 90  Resp: 16  Temp: (!) 96.4 F (35.8 C)  SpO2: 96%   Height: 5\' 10"  (177.8 cm)  Weight: 216 lb 2 oz (98 kg)   Physical Exam Constitutional:      Appearance: He is not diaphoretic.  HENT:     Head: Normocephalic.     Right Ear: Tympanic membrane, ear canal and external ear normal.     Left Ear: Tympanic membrane, ear canal and external ear normal.     Nose: Nose normal. No mucosal edema or rhinorrhea.     Mouth/Throat:     Pharynx: Uvula midline. No oropharyngeal exudate.  Eyes:     Conjunctiva/sclera: Conjunctivae normal.  Neck:     Thyroid: No thyromegaly.     Trachea: Trachea normal. No tracheal tenderness or tracheal deviation.  Cardiovascular:     Rate and Rhythm: Normal rate and regular rhythm.     Heart sounds: Normal heart sounds, S1 normal and S2 normal. No murmur heard. Pulmonary:     Effort: No respiratory distress.     Breath sounds: Normal breath sounds. No stridor. No wheezing or rales.  Lymphadenopathy:     Head:     Right side of head: No tonsillar adenopathy.     Left side of head: No tonsillar adenopathy.     Cervical: No cervical adenopathy.  Skin:    Findings: No erythema or rash.     Nails: There is no clubbing.  Neurological:     Mental Status: He is alert.    Diagnostics:    Spirometry was performed and demonstrated an FEV1 of 1.93 at 67 % of predicted.  The patient had an Asthma Control Test with the following results:  .    Results of a chest CT angio obtained 12 November 2020 identifies the following:  Cardiovascular: Satisfactory opacification of the pulmonary arteries to the segmental level. No evidence of pulmonary embolism. Similar appearing mild global cardiomegaly. No pericardial effusion.   Mediastinum/Nodes: No enlarged mediastinal, hilar, or axillary lymph nodes. Thyroid gland, trachea, and esophagus demonstrate no significant findings.   Lungs/Pleura: Bibasilar  cicatricial atelectasis. No focal consolidations. No suspicious pulmonary nodules. No pleural effusion or pneumothorax.  Assessment and Plan:   1. COPD with asthma (HCC)  2. Other allergic rhinitis   3. Gastroesophageal reflux disease, unspecified whether esophagitis present     1. Continue to Perform Allergen avoidance measures - dust mite, pollens, molds  2. Continue to Treat and prevent inflammation:   A. Breztri - 2 inhalations 1-2 times per day with spacer  B. Flonase - 1 spray each nostril 2 times per day  3. If needed:   A. Albuterol HFA 2 puffs every 4-6 hours  B. Atrovent - 2 puffs every 6 hours  C. OTC antihistamine - Claritin/Zyrtec/Allegra  D. nasal ipratropium 0.06% 2 sprays each nostril every 6 hours  E. Pataday - 1 drop each eye 1 time per day  4. "Action plan" for asthma flare up:   A. continue Breztri  B. add Flovent 110 3 inhalations 3 times a day with spacer  C. use Proventil or Ventolin HFA if needed  5. Continue treatment for reflux with Protonix 40 mg 1-2 times a day   6. Return to clinic in 6 months or earlier if problem  7. Obtain fall flu vaccine  Overall [name] appears to be doing relatively well on his current plan which includes anti-inflammatory agents for his airway and therapy directed against reflux and I have instructed him to continue to utilize this plan plus several medications should they be required as noted above.  If he has any difficulty over the course the next 6 months he will contact me for further evaluation and treatment but if he does well I will see him back in this clinic in 6 months.   Laurette SchimkeEric Rochell Puett, MD Allergy / Immunology [facility]

## 2021-01-30 ENCOUNTER — Encounter: Payer: Self-pay | Admitting: Allergy and Immunology

## 2021-01-31 LAB — TSH: TSH: 1.69 (ref 0.41–5.90)

## 2021-01-31 LAB — CBC AND DIFFERENTIAL
HCT: 38 — AB (ref 41–53)
Hemoglobin: 13.2 — AB (ref 13.5–17.5)
WBC: 6.5

## 2021-01-31 LAB — LIPID PANEL
Cholesterol: 138 (ref 0–200)
HDL: 47 (ref 35–70)
Triglycerides: 154 (ref 40–160)

## 2021-01-31 LAB — HEMOGLOBIN A1C: Hemoglobin A1C: 7

## 2021-01-31 LAB — CBC: RBC: 3.78 — AB (ref 3.87–5.11)

## 2021-01-31 LAB — VITAMIN B12: Vitamin B-12: 706

## 2021-01-31 LAB — VITAMIN D 25 HYDROXY (VIT D DEFICIENCY, FRACTURES): Vit D, 25-Hydroxy: 22

## 2021-02-04 ENCOUNTER — Encounter: Payer: Self-pay | Admitting: Internal Medicine

## 2021-02-04 ENCOUNTER — Encounter: Payer: Self-pay | Admitting: Allergy and Immunology

## 2021-02-07 DIAGNOSIS — N2581 Secondary hyperparathyroidism of renal origin: Secondary | ICD-10-CM | POA: Diagnosis not present

## 2021-02-07 DIAGNOSIS — E1122 Type 2 diabetes mellitus with diabetic chronic kidney disease: Secondary | ICD-10-CM | POA: Diagnosis not present

## 2021-02-07 DIAGNOSIS — M545 Low back pain, unspecified: Secondary | ICD-10-CM | POA: Diagnosis not present

## 2021-02-07 DIAGNOSIS — D631 Anemia in chronic kidney disease: Secondary | ICD-10-CM | POA: Diagnosis not present

## 2021-02-07 DIAGNOSIS — I129 Hypertensive chronic kidney disease with stage 1 through stage 4 chronic kidney disease, or unspecified chronic kidney disease: Secondary | ICD-10-CM | POA: Diagnosis not present

## 2021-02-07 DIAGNOSIS — M25552 Pain in left hip: Secondary | ICD-10-CM | POA: Diagnosis not present

## 2021-02-07 DIAGNOSIS — N1831 Chronic kidney disease, stage 3a: Secondary | ICD-10-CM | POA: Diagnosis not present

## 2021-02-08 ENCOUNTER — Encounter: Payer: Self-pay | Admitting: Internal Medicine

## 2021-02-13 DIAGNOSIS — M47896 Other spondylosis, lumbar region: Secondary | ICD-10-CM | POA: Diagnosis not present

## 2021-02-13 DIAGNOSIS — M5416 Radiculopathy, lumbar region: Secondary | ICD-10-CM | POA: Diagnosis not present

## 2021-02-13 DIAGNOSIS — M545 Low back pain, unspecified: Secondary | ICD-10-CM | POA: Diagnosis not present

## 2021-02-13 DIAGNOSIS — M6281 Muscle weakness (generalized): Secondary | ICD-10-CM | POA: Diagnosis not present

## 2021-02-19 DIAGNOSIS — M6281 Muscle weakness (generalized): Secondary | ICD-10-CM | POA: Diagnosis not present

## 2021-02-19 DIAGNOSIS — M545 Low back pain, unspecified: Secondary | ICD-10-CM | POA: Diagnosis not present

## 2021-02-19 DIAGNOSIS — M5416 Radiculopathy, lumbar region: Secondary | ICD-10-CM | POA: Diagnosis not present

## 2021-02-19 DIAGNOSIS — M47896 Other spondylosis, lumbar region: Secondary | ICD-10-CM | POA: Diagnosis not present

## 2021-02-21 ENCOUNTER — Encounter: Payer: Self-pay | Admitting: Internal Medicine

## 2021-02-21 DIAGNOSIS — M6281 Muscle weakness (generalized): Secondary | ICD-10-CM | POA: Diagnosis not present

## 2021-02-21 DIAGNOSIS — M545 Low back pain, unspecified: Secondary | ICD-10-CM | POA: Diagnosis not present

## 2021-02-21 DIAGNOSIS — M5416 Radiculopathy, lumbar region: Secondary | ICD-10-CM | POA: Diagnosis not present

## 2021-02-21 DIAGNOSIS — M47896 Other spondylosis, lumbar region: Secondary | ICD-10-CM | POA: Diagnosis not present

## 2021-02-25 DIAGNOSIS — M5416 Radiculopathy, lumbar region: Secondary | ICD-10-CM | POA: Diagnosis not present

## 2021-02-25 DIAGNOSIS — M545 Low back pain, unspecified: Secondary | ICD-10-CM | POA: Diagnosis not present

## 2021-02-25 DIAGNOSIS — M6281 Muscle weakness (generalized): Secondary | ICD-10-CM | POA: Diagnosis not present

## 2021-02-25 DIAGNOSIS — M47896 Other spondylosis, lumbar region: Secondary | ICD-10-CM | POA: Diagnosis not present

## 2021-02-28 DIAGNOSIS — M5416 Radiculopathy, lumbar region: Secondary | ICD-10-CM | POA: Diagnosis not present

## 2021-02-28 DIAGNOSIS — M545 Low back pain, unspecified: Secondary | ICD-10-CM | POA: Diagnosis not present

## 2021-02-28 DIAGNOSIS — M47896 Other spondylosis, lumbar region: Secondary | ICD-10-CM | POA: Diagnosis not present

## 2021-02-28 DIAGNOSIS — M6281 Muscle weakness (generalized): Secondary | ICD-10-CM | POA: Diagnosis not present

## 2021-03-02 ENCOUNTER — Other Ambulatory Visit: Payer: Self-pay | Admitting: Internal Medicine

## 2021-03-02 DIAGNOSIS — E039 Hypothyroidism, unspecified: Secondary | ICD-10-CM

## 2021-03-15 ENCOUNTER — Ambulatory Visit: Payer: Medicare Other | Admitting: Cardiovascular Disease

## 2021-03-15 ENCOUNTER — Encounter: Payer: Self-pay | Admitting: Cardiovascular Disease

## 2021-03-15 ENCOUNTER — Other Ambulatory Visit: Payer: Self-pay

## 2021-03-15 ENCOUNTER — Encounter: Payer: Self-pay | Admitting: Internal Medicine

## 2021-03-15 VITALS — BP 124/62 | HR 75 | Ht 70.0 in | Wt 199.4 lb

## 2021-03-15 DIAGNOSIS — I779 Disorder of arteries and arterioles, unspecified: Secondary | ICD-10-CM

## 2021-03-15 DIAGNOSIS — I493 Ventricular premature depolarization: Secondary | ICD-10-CM

## 2021-03-15 DIAGNOSIS — I10 Essential (primary) hypertension: Secondary | ICD-10-CM

## 2021-03-15 DIAGNOSIS — I25118 Atherosclerotic heart disease of native coronary artery with other forms of angina pectoris: Secondary | ICD-10-CM

## 2021-03-15 DIAGNOSIS — E7800 Pure hypercholesterolemia, unspecified: Secondary | ICD-10-CM

## 2021-03-15 NOTE — Progress Notes (Signed)
Chief Complaint  Patient presents with   Follow-up    CAD    History of Present Illness: 80 yo male with history of CAD, HTN, HLD and DM here today for cardiac follow up. He has a history of coronary angioplasty in [place] and in [place] in the 1990s. He had increased lower extremity edema in April 2021 and Lasix was started with improvement in edema.  Echo April 2021 with LVEF 55-60%, mild LVH, mild MR. Carotid artery dopplers April 2021 with mild bilateral carotid artery disease. He was admitted to [facility] May 2021 with chest pain, troponin 101. Cardiac cath May 2021 with CTO of the RCA, 95% OM stenosis, moderate LAD stenosis. Medical management was recommended. He was seen in our office 10/26/19 by Ronie Spies, PA-C and c/o lightheadedness. 3 day cardiac monitor with frequent PVCs. He has been seen in the EP clinic by Dr. Johney Frame 12/05/19. Plan was to continue the beta blocker.   He is here today for follow up. The patient denies any chest pain, dyspnea, palpitations, lower extremity edema, orthopnea, PND, dizziness, near syncope or syncope.   Primary Care Physician: Philip Aspen, Limmie Patricia, MD  Past Medical History:  Diagnosis Date   Anemia    Asthma    Cataract    CKD (chronic kidney disease), stage III [facility])    Coronary artery disease 1991   a) Angioplasty LAD 1991 [place] b) MI in 1995 at  [facility] in [place], med Rx c) Cath 10/2019 for NSTEMI - see report -> med rx.   Diabetes mellitus    GERD (gastroesophageal reflux disease)    Hearing loss    HTN (hypertension)    Hyperlipidemia    Hypothyroidism    Myocardial infarct (HCC)    Nephrolithiasis    Osteopenia    Premature atrial contractions    PVC's (premature ventricular contractions)    Snake bite     Past Surgical History:  Procedure Laterality Date   ANGIOPLASTY  1991   CARDIAC CATHETERIZATION  2005   Moderate LCx and LAD disease and severe diffuse RCA disease   CARDIAC CATHETERIZATION  01/2012   normal  left main, 50% pLAD stenosis, dLAD mild luminal irregularities, D1 30-40% stenosis at take off; subtotally occluded/severely diseased OM1, mid 80% stenosis in small OM2, long prox and mid RCA stenoses ranging from 60-95%, severely diseased PDA, distal PLSA occluded filling via L-R collateral; normal LV function; inferior lateral basal akinesis.   CHOLECYSTECTOMY  2012   EYE SURGERY  2010   cataract - right, has left done too   INGUINAL HERNIA REPAIR     INTRAVASCULAR PRESSURE WIRE/FFR STUDY N/A 10/10/2019   Procedure: INTRAVASCULAR PRESSURE WIRE/FFR STUDY;  Surgeon: Runell Gess, MD;  [facility];  Service: Cardiovascular;  Laterality: N/A;   LEFT HEART CATH AND CORONARY ANGIOGRAPHY N/A 10/10/2019   Procedure: LEFT HEART CATH AND CORONARY ANGIOGRAPHY;  Surgeon: Runell Gess, MD;  [facility];  Service: Cardiovascular;  Laterality: N/A;   LEFT HEART CATHETERIZATION WITH CORONARY ANGIOGRAM N/A 01/28/2012   Procedure: LEFT HEART CATHETERIZATION WITH CORONARY ANGIOGRAM;  Surgeon: Iran Ouch, MD;  [facility];  Service: Cardiovascular;  Laterality: N/A;   LEFT HEART CATHETERIZATION WITH CORONARY ANGIOGRAM N/A 03/17/2014   Procedure: LEFT HEART CATHETERIZATION WITH CORONARY ANGIOGRAM;  Surgeon: Kathleene Hazel, MD;  Location: [facility] CATH LAB;  Service: Cardiovascular;  Laterality: N/A;   ORIF TIBIA & FIBULA FRACTURES  PTCA     SHOULDER ARTHROSCOPY WITH DISTAL CLAVICLE RESECTION Left 12/16/2018   Procedure: LEFT SHOULDER ARTHROSCOPY WITH DISTAL CLAVICLE RESECTION;  Surgeon: Bjorn Pippin, MD;  Location: [facility];  Service: Orthopedics;  Laterality: Left;   SHOULDER ARTHROSCOPY WITH ROTATOR CUFF REPAIR AND SUBACROMIAL DECOMPRESSION Left 12/16/2018   Procedure: LEFT SHOULDER ARTHROSCOPY WITH ROTATOR CUFF REPAIR AND SUBACROMIAL DECOMPRESSION, DEBRIDEMENT;  Surgeon: Bjorn Pippin, MD;  Location: [facility];  Service:  Orthopedics;  Laterality: Left;   SHOULDER ARTHROSCOPY WITH SUBACROMIAL DECOMPRESSION AND BICEP TENDON REPAIR Left 12/16/2018   Procedure: SHOULDER ARTHROSCOPY WITH SUBACROMIAL DECOMPRESSION AND BICEP TENDON REPAIR;  Surgeon: Bjorn Pippin, MD;  Location: [facility];  Service: Orthopedics;  Laterality: Left;   VASECTOMY      Current Outpatient Medications  Medication Sig Dispense Refill   Albuterol (PROVENTIL IN) Inhale into the lungs.     albuterol (VENTOLIN HFA) 108 (90 Base) MCG/ACT inhaler Inhale 2 puffs into the lungs every 4 (four) hours as needed for wheezing or shortness of breath. 18 g 3   ALPRAZolam (XANAX) 0.25 MG tablet TAKE 1 TABLET BY MOUTH AT BEDTIME AS NEEDED FOR SLEEP 20 tablet 2   aspirin EC 81 MG tablet Take 81 mg by mouth daily.     atorvastatin (LIPITOR) 40 MG tablet Take 1 tablet (40 mg total) by mouth daily. 90 tablet 3   Budeson-Glycopyrrol-Formoterol (BREZTRI AEROSPHERE) 160-9-4.8 MCG/ACT AERO 2 puffs 1-2 times per day with spacer. 10.7 g 5   Capsaicin 0.1 % CREA APPLY SMALL AMOUNT TO AFFECTED AREA DAILY AS NEEDED     carvedilol (COREG) 6.25 MG tablet Take 1 tablet (6.25 mg total) by mouth 2 (two) times daily with a meal. 180 tablet 1   Cholecalciferol (VITAMIN D3 PO) Take 1 tablet by mouth daily.     Cyanocobalamin (B-12 PO) Take 1 tablet by mouth daily.     empagliflozin (JARDIANCE) 25 MG TABS tablet Take 0.5 tablets by mouth every morning. Pt take 12.5 mg once  a day.     ferrous sulfate 325 (65 FE) MG EC tablet Take 325 mg by mouth daily.      fluticasone (FLONASE) 50 MCG/ACT nasal spray USE 1 TO 2 SPRAY(S) IN EACH NOSTRIL ONCE DAILY AS DIRECTED 48 g 1   fluticasone (FLOVENT HFA) 110 MCG/ACT inhaler Inhale 3 puffs into the lungs in the morning, at noon, and at bedtime. 1 each 5   furosemide (LASIX) 20 MG tablet Take 20 mg by mouth.      ipratropium (ATROVENT HFA) 17 MCG/ACT inhaler Inhale 2 puffs into the lungs every 4 (four) hours as needed for  wheezing. 12.9 g 1   ipratropium (ATROVENT) 0.06 % nasal spray 2 sprays each nostril every 6 hours. 15 mL 5   isosorbide mononitrate (IMDUR) 30 MG 24 hr tablet TAKE ONE TABLET BY MOUTH TWICE DAILY 180 tablet 3   levothyroxine (SYNTHROID) 100 MCG tablet Take 1 tablet by mouth once daily 90 tablet 0   nitroGLYCERIN (NITROSTAT) 0.4 MG SL tablet Place 1 tablet (0.4 mg total) under the tongue every 5 (five) minutes x 3 doses as needed for chest pain. 25 tablet 3   olopatadine (PATANOL) 0.1 % ophthalmic solution Place 2 drops into both eyes 2 (two) times daily. 5 mL 5   ondansetron (ZOFRAN) 8 MG tablet Take 8 mg by mouth every 8 (eight) hours as needed for nausea or vomiting.      pantoprazole (  PROTONIX) 40 MG tablet Take 1 tablet (40 mg total) by mouth daily. (Patient taking differently: Take 40 mg by mouth 2 (two) times daily.) 90 tablet 1   pregabalin (LYRICA) 75 MG capsule Take 1 capsule by mouth twice daily 180 capsule 0   tamsulosin (FLOMAX) 0.4 MG CAPS capsule TAKE ONE CAPSULE BY MOUTH DAILY 30 capsule 5   predniSONE (DELTASONE) 20 MG tablet Take 2 tablets (40 mg total) by mouth daily. Start tomorrow 11/13/20 10 tablet 0   No current facility-administered medications for this visit.    Allergies  Allergen Reactions   Meloxicam Other (See Comments) and Anxiety    "jumping out of my skin" Pt feels anxiety attacks when on this medicine   Trazodone And Nefazodone     "makes me freak out"    Social History   Socioeconomic History   Marital status: Married    Spouse name: Not on file   Number of children: Not on file   Years of education: Not on file   Highest education level: Not on file  Occupational History   Occupation: Retired  Tobacco Use   Smoking status: Never   Smokeless tobacco: Never  Vaping Use   Vaping Use: Never used  Substance and Sexual Activity   Alcohol use: Yes    Alcohol/week: 0.0 standard drinks    Comment: occ, once every 2 months   Drug use: No   Sexual  activity: Not on file  Other Topics Concern   Not on file  Social History Narrative   Not on file   Social Determinants of Health   Financial Resource Strain: Not on file  Food Insecurity: Not on file  Transportation Needs: Not on file  Physical Activity: Not on file  Stress: Not on file  Social Connections: Not on file  Intimate Partner Violence: Not on file    Family History  Problem Relation Age of Onset   Asthma Mother    Allergies Sister    Coronary artery disease Other    Colon cancer Neg Hx    Esophageal cancer Neg Hx    Stomach cancer Neg Hx    Rectal cancer Neg Hx     Review of Systems:  As stated in the HPI and otherwise negative.   BP 124/62   Pulse 75   Ht 5\' 10"  (1.778 m)   Wt 199 lb 6.4 oz (90.4 kg)   SpO2 95%   BMI 28.61 kg/m   Physical Examination:  General: Well developed, well nourished, NAD  HEENT: OP clear, mucus membranes moist  SKIN: warm, dry. No rashes. Neuro: No focal deficits  Musculoskeletal: Muscle strength 5/5 all ext  Psychiatric: Mood and affect normal  Neck: No JVD, no carotid bruits, no thyromegaly, no lymphadenopathy.  Lungs:Clear bilaterally, no wheezes, rhonci, crackles Cardiovascular: Regular rate and rhythm. No murmurs, gallops or rubs. Abdomen:Soft. Bowel sounds present. Non-tender.  Extremities: No lower extremity edema. Pulses are 2 + in the bilateral DP/PT.  Echo April 2021:   1. Left ventricular ejection fraction, by estimation, is 55 to 60%. The  left ventricle has normal function. The left ventricle demonstrates  regional wall motion abnormalities (see scoring diagram/findings for  description). There is mild concentric left  ventricular hypertrophy. Left ventricular diastolic parameters are  consistent with Grade I diastolic dysfunction (impaired relaxation).   2. Right ventricular systolic function is normal. The right ventricular  size is normal.   3. Left atrial size was mildly dilated.   4.  The mitral  valve is normal in structure. Mild mitral valve  regurgitation. No evidence of mitral stenosis.   5. The aortic valve is normal in structure. Aortic valve regurgitation is  not visualized. No aortic stenosis is present.   6. The inferior vena cava is normal in size with greater than 50%  respiratory variability, suggesting right atrial pressure of 3 mmHg.    EKG:  EKG is not ordered today. The ekg ordered today demonstrates   Recent Labs: 11/12/2020: B Natriuretic Peptide 82.6; BUN 21; Creatinine, Ser 1.44; Platelets 228; Potassium 4.8; Sodium 137 01/31/2021: Hemoglobin 13.2; TSH 1.69   Lipid Panel Lipid Panel     Component Value Date/Time   CHOL 138 01/31/2021 0000   CHOL 113 12/05/2019 0748   TRIG 154 01/31/2021 0000   HDL 47 01/31/2021 0000   HDL 41 12/05/2019 0748   CHOLHDL 2.8 12/05/2019 0748   CHOLHDL 3.8 10/09/2019 0511   VLDL 37 10/09/2019 0511   LDLCALC 51 12/05/2019 0748    Wt Readings from Last 3 Encounters:  03/15/21 199 lb 6.4 oz (90.4 kg)  01/29/21 216 lb 2 oz (98 kg)  11/12/20 213 lb 13.5 oz (97 kg)     Other studies Reviewed: Additional studies/ records that were reviewed today include: . Review of the above records demonstrates:    Assessment and Plan:   1. CAD with stable angina: He has no chest pain. CAD stable by cardiac cath May 2021. Continue ASA, statin, beta blocker and Imdur.       2. HTN: BP controlled. No changes  3. HLD: Lipids followed at the [place]. LDL 51 in 2021. Continue statin  4. PVCs: No dizziness. Continue beta blocker  5. Carotid artery disease: Mild bilateral carotid artery disease by dopplers April 2021. Plan to repeat in April 2023.   6. Mitral valve regurgitation: Mild by echo in May 2021.   Current medicines are reviewed at length with the patient today.  The patient does not have concerns regarding medicines.  The following changes have been made:  no change  Labs/ tests ordered today include:   Orders Placed This  Encounter  Procedures   VAS US CAROTID     Disposition:   FU with me in 12  months   Signed, Verne Carrow, MD 03/15/2021 2:13 PM    [facility] [address] [place], [place], [place]  [address] Phone: [phone]; Fax: [phone]

## 2021-03-15 NOTE — Patient Instructions (Signed)
Medication Instructions:  No changes *If you need a refill on your cardiac medications before your next appointment, please call your pharmacy*   Lab Work: none If you have labs (blood work) drawn today and your tests are completely normal, you will receive your results only by: . MyChart Message (if you have MyChart) OR . A paper copy in the mail If you have any lab test that is abnormal or we need to change your treatment, we will call you to review the results.   Testing/Procedures: Your physician has requested that you have a carotid duplex. This test is an ultrasound of the carotid arteries in your neck. It looks at blood flow through these arteries that supply the brain with blood. Allow one hour for this exam. There are no restrictions or special instructions.    Follow-Up: At [facility], you and your health needs are our priority.  As part of our continuing mission to provide you with exceptional heart care, we have created designated Provider Care Teams.  These Care Teams include your primary Cardiologist (physician) and Advanced Practice Providers (APPs -  Physician Assistants and Nurse Practitioners) who all work together to provide you with the care you need, when you need it.   Your next appointment:   12 month(s)  The format for your next appointment:   In Person  Provider:   You may see Christopher McAlhany, MD or one of the following Advanced Practice Providers on your designated Care Team:    Dayna Dunn, PA-C  Michele Lenze, PA-C    Other Instructions   

## 2021-03-19 NOTE — Telephone Encounter (Signed)
Pt chart has been updated.

## 2021-03-25 ENCOUNTER — Other Ambulatory Visit: Payer: Self-pay | Admitting: Internal Medicine

## 2021-03-25 DIAGNOSIS — E1142 Type 2 diabetes mellitus with diabetic polyneuropathy: Secondary | ICD-10-CM

## 2021-03-25 DIAGNOSIS — H349 Unspecified retinal vascular occlusion: Secondary | ICD-10-CM | POA: Diagnosis not present

## 2021-04-29 ENCOUNTER — Encounter: Payer: Self-pay | Admitting: Cardiovascular Disease

## 2021-05-11 ENCOUNTER — Other Ambulatory Visit: Payer: Self-pay | Admitting: Internal Medicine

## 2021-05-11 DIAGNOSIS — E039 Hypothyroidism, unspecified: Secondary | ICD-10-CM

## 2021-05-11 DIAGNOSIS — G4700 Insomnia, unspecified: Secondary | ICD-10-CM

## 2021-05-15 LAB — COMPREHENSIVE METABOLIC PANEL
Albumin: 3.8 (ref 3.5–5.0)
Calcium: 9.7 (ref 8.7–10.7)

## 2021-05-15 LAB — BASIC METABOLIC PANEL
CO2: 25 — AB (ref 13–22)
Chloride: 108 (ref 99–108)
Glucose: 124
Potassium: 4.3 (ref 3.4–5.3)
Sodium: 138 (ref 137–147)

## 2021-05-16 ENCOUNTER — Encounter: Payer: Self-pay | Admitting: Cardiovascular Disease

## 2021-05-17 ENCOUNTER — Encounter: Payer: Self-pay | Admitting: Internal Medicine

## 2021-05-23 ENCOUNTER — Encounter: Payer: Self-pay | Admitting: Internal Medicine

## 2021-05-30 DIAGNOSIS — H2000 Unspecified acute and subacute iridocyclitis: Secondary | ICD-10-CM | POA: Diagnosis not present

## 2021-06-07 ENCOUNTER — Encounter: Payer: Self-pay | Admitting: Internal Medicine

## 2021-06-27 ENCOUNTER — Other Ambulatory Visit: Payer: Self-pay | Admitting: Neurosurgery

## 2021-06-27 ENCOUNTER — Other Ambulatory Visit: Payer: Self-pay | Admitting: Internal Medicine

## 2021-06-27 DIAGNOSIS — E1142 Type 2 diabetes mellitus with diabetic polyneuropathy: Secondary | ICD-10-CM

## 2021-06-27 DIAGNOSIS — R2689 Other abnormalities of gait and mobility: Secondary | ICD-10-CM

## 2021-06-27 NOTE — Telephone Encounter (Signed)
Last filled 03/26/2021 Last OV 01/18/2020  Ok to fill?

## 2021-07-07 ENCOUNTER — Other Ambulatory Visit: Payer: Self-pay

## 2021-07-07 ENCOUNTER — Ambulatory Visit
Admission: RE | Admit: 2021-07-07 | Discharge: 2021-07-07 | Disposition: A | Discharge status: Home / Self Care | Payer: Self-pay | Source: Ambulatory Visit | Attending: Neurosurgery | Admitting: Neurosurgery

## 2021-07-07 DIAGNOSIS — R2689 Other abnormalities of gait and mobility: Secondary | ICD-10-CM

## 2021-08-13 ENCOUNTER — Ambulatory Visit: Payer: Medicare Other | Admitting: Allergy and Immunology

## 2021-08-13 ENCOUNTER — Encounter: Payer: Self-pay | Admitting: Internal Medicine

## 2021-08-14 ENCOUNTER — Encounter: Payer: Self-pay | Admitting: Internal Medicine

## 2021-08-27 ENCOUNTER — Ambulatory Visit: Payer: Medicare Other | Admitting: Allergy and Immunology

## 2021-08-27 ENCOUNTER — Encounter: Payer: Self-pay | Admitting: Allergy and Immunology

## 2021-08-27 ENCOUNTER — Other Ambulatory Visit: Payer: Self-pay

## 2021-08-27 VITALS — BP 128/72 | HR 87 | Temp 97.7°F | Resp 16 | Ht 70.0 in | Wt 186.8 lb

## 2021-08-27 DIAGNOSIS — J3089 Other allergic rhinitis: Secondary | ICD-10-CM

## 2021-08-27 DIAGNOSIS — G4733 Obstructive sleep apnea (adult) (pediatric): Secondary | ICD-10-CM | POA: Diagnosis not present

## 2021-08-27 DIAGNOSIS — K219 Gastro-esophageal reflux disease without esophagitis: Secondary | ICD-10-CM

## 2021-08-27 DIAGNOSIS — J449 Chronic obstructive pulmonary disease, unspecified: Secondary | ICD-10-CM

## 2021-08-27 MED ORDER — ALBUTEROL SULFATE HFA 108 (90 BASE) MCG/ACT IN AERS: 2.0000 | INHALATION_SPRAY | RESPIRATORY_TRACT | 1 refills | Status: DC | PRN

## 2021-08-27 MED ORDER — FLUTICASONE PROPIONATE HFA 110 MCG/ACT IN AERO: 3.0000 | INHALATION_SPRAY | Freq: Three times a day (TID) | RESPIRATORY_TRACT | 5 refills | Status: DC

## 2021-08-27 MED ORDER — IPRATROPIUM BROMIDE 0.06 % NA SOLN: NASAL | 5 refills | Status: DC

## 2021-08-27 MED ORDER — BREZTRI AEROSPHERE 160-9-4.8 MCG/ACT IN AERO: INHALATION_SPRAY | RESPIRATORY_TRACT | 5 refills | Status: DC

## 2021-08-27 MED ORDER — FLUTICASONE PROPIONATE 50 MCG/ACT NA SUSP: NASAL | 1 refills | Status: DC

## 2021-08-27 NOTE — Patient Instructions (Addendum)
?  1. Continue to Perform Allergen avoidance measures - dust mite, pollens, molds ? ?2. Continue to Treat and prevent inflammation: ? ? AMarkus Daft - 2 inhalations 1-2 times per day with spacer ? B. Flonase - 1 spray each nostril 1-2 times per day ? ?3. Continue treatment for reflux: ? ?A. Protonix 40 mg 1-2 times a day  ? ?4. If needed: ? ? A. Albuterol HFA 2 puffs every 4-6 hours ? B. Atrovent - 2 puffs every 6 hours ? C. OTC antihistamine - Claritin/Zyrtec/Allegra ? D. nasal ipratropium 0.06% 2 sprays each nostril every 6 hours ? E. Pataday - 1 drop each eye 1 time per day ? ?5. "Action plan" for asthma flare up: ? ? A. continue Breztri ? B. add Flovent 110 3 inhalations 3 times a day with spacer ? C. use Proventil or Ventolin HFA if needed ? ?6. Return to clinic in 6 months or earlier if problem ? ?  ? ? ? ?  ?

## 2021-08-27 NOTE — Progress Notes (Signed)
? ?[place] ? ? ?Follow-up Note ? ?Referring Provider: Isaac Bliss, Est[place]* ?Primary Provider: Isaac Bliss, Rayford Halsted, MD ?Date of Office Visit: 08/27/2021 ? ?Subjective:  ? ?Richard Ho (DOB: [birth year]) is a 81 y.o. male who returns to the [facility] [place] on 08/27/2021 in re-evaluation of the following: ? ?HPI: Jaxsten presents to this clinic in evaluation of COPD/asthma overlap, allergic rhinoconjunctivitis, and reflux.  His last visit to this clinic was 29 January 2021. ? ?He has had excellent control of his COPD/asthma while using a triple inhaler on a consistent basis.  While doing so he rarely uses a short acting bronchodilator and he can exert himself to the extent that his musculoskeletal issues allow him to do so without any problem and he has not required a systemic steroid to treat an exacerbation. ? ?His upper airway is also doing quite well while using Flonase and he has not required any antibiotic to treat episode of sinusitis.  He sometimes uses nasal ipratropium. ? ?His eyes have been doing okay while using some Pataday. ? ?His reflux is under very good control while using Protonix mostly 1 time per day at this point. ? ?His sleep apnea is untreated at this point.  He has failed CPAP use and he has no interest in undergoing any further evaluation or treatment for this issue.  He has lost approximately 30 pounds of weight and he believes that this has resulted in resolution of his sleep apnea. ? ?He has developed a left lower extremity radiculopathy and will be having steroid injections at some point in the near future. ? ?Allergies as of 08/27/2021   ? ?   Reactions  ? Meloxicam Other (See Comments), Anxiety  ? "jumping out of my skin" ?Pt feels anxiety attacks when on this medicine  ? ?  ? ?  ?Medication List  ? ? ?albuterol 108 (90 Base) MCG/ACT inhaler ?Commonly known as: VENTOLIN HFA ?Inhale 2 puffs into the lungs every 4  (four) hours as needed for wheezing or shortness of breath. ?  ?ALPRAZolam 0.25 MG tablet ?Commonly known as: Duanne Moron ?TAKE 1 TABLET BY MOUTH AT BEDTIME AS NEEDED FOR SLEEP ?  ?aspirin EC 81 MG tablet ?Take 81 mg by mouth daily. ?  ?atorvastatin 40 MG tablet ?Commonly known as: LIPITOR ?Take 1 tablet (40 mg total) by mouth daily. ?  ?Atrovent HFA 17 MCG/ACT inhaler ?Generic drug: ipratropium ?Inhale 2 puffs into the lungs every 4 (four) hours as needed for wheezing. ?  ?B-12 PO ?Take 1 tablet by mouth daily. ?  ?Breztri Aerosphere 160-9-4.8 MCG/ACT Aero ?Generic drug: Budeson-Glycopyrrol-Formoterol ?2 puffs 1-2 times per day with spacer. ?  ?Capsaicin 0.1 % Crea ?APPLY SMALL AMOUNT TO AFFECTED AREA DAILY AS NEEDED ?  ?carvedilol 6.25 MG tablet ?Commonly known as: COREG ?Take 1 tablet (6.25 mg total) by mouth 2 (two) times daily with a meal. ?  ?empagliflozin 25 MG Tabs tablet ?Commonly known as: JARDIANCE ?Take 0.5 tablets by mouth every morning. Pt take 12.5 mg once  a day. ?  ?ferrous sulfate 325 (65 FE) MG EC tablet ?Take 325 mg by mouth daily. ?  ?Flovent HFA 110 MCG/ACT inhaler ?Generic drug: fluticasone ?Inhale 3 puffs into the lungs in the morning, at noon, and at bedtime. ?  ?fluticasone 50 MCG/ACT nasal spray ?Commonly known as: FLONASE ?USE 1 TO 2 SPRAY(S) IN EACH NOSTRIL ONCE DAILY AS DIRECTED ?  ?furosemide 20 MG tablet ?  Commonly known as: LASIX ?Take 20 mg by mouth. ?  ?ipratropium 0.06 % nasal spray ?Commonly known as: ATROVENT ?2 sprays each nostril every 6 hours. ?  ?isosorbide mononitrate 30 MG 24 hr tablet ?Commonly known as: IMDUR ?TAKE ONE TABLET BY MOUTH TWICE DAILY ?  ?levothyroxine 100 MCG tablet ?Commonly known as: SYNTHROID ?Take 1 tablet by mouth once daily ?  ?nitroGLYCERIN 0.4 MG SL tablet ?Commonly known as: NITROSTAT ?Place 1 tablet (0.4 mg total) under the tongue every 5 (five) minutes x 3 doses as needed for chest pain. ?  ?olopatadine 0.1 % ophthalmic solution ?Commonly known as:  PATANOL ?Place 2 drops into both eyes 2 (two) times daily. ?  ?ondansetron 8 MG tablet ?Commonly known as: ZOFRAN ?Take 8 mg by mouth every 8 (eight) hours as needed for nausea or vomiting. ?  ?pantoprazole 40 MG tablet ?Commonly known as: PROTONIX ?Take 1 tablet (40 mg total) by mouth daily. ?What changed: when to take this ?  ?pregabalin 75 MG capsule ?Commonly known as: LYRICA ?Take 1 capsule by mouth twice daily ?  ?PROVENTIL IN ?Inhale into the lungs. ?  ?tamsulosin 0.4 MG Caps capsule ?Commonly known as: FLOMAX ?TAKE ONE CAPSULE BY MOUTH DAILY ?  ?VITAMIN D3 PO ?Take 1 tablet by mouth daily. ?  ? ?Past Medical History:  ?Diagnosis Date  ? Anemia   ? Asthma   ? Cataract   ? CKD (chronic kidney disease), stage III ([place])   ? Coronary artery disease 1991  ? a) Angioplasty LAD 1991 [place] b) MI in 1995 at  [facility] in [place], med Rx c) Cath 10/2019 for NSTEMI - see report -> med rx.  ? Diabetes mellitus   ? GERD (gastroesophageal reflux disease)   ? Hearing loss   ? HTN (hypertension)   ? Hyperlipidemia   ? Hypothyroidism   ? Myocardial infarct [facility])   ? Nephrolithiasis   ? Osteopenia   ? Premature atrial contractions   ? PVC's (premature ventricular contractions)   ? Snake bite   ? ? ?Past Surgical History:  ?Procedure Laterality Date  ? ANGIOPLASTY  1991  ? CARDIAC CATHETERIZATION  2005  ? Moderate LCx and LAD disease and severe diffuse RCA disease  ? CARDIAC CATHETERIZATION  01/2012  ? normal left main, 50% pLAD stenosis, dLAD mild luminal irregularities, D1 30-40% stenosis at take off; subtotally occluded/severely diseased OM1, mid 80% stenosis in small OM2, long prox and mid RCA stenoses ranging from 60-95%, severely diseased PDA, distal PLSA occluded filling via L-R collateral; normal LV function; inferior lateral basal akinesis.  ? CHOLECYSTECTOMY  2012  ? EYE SURGERY  2010  ? cataract - right, has left done too  ? INGUINAL HERNIA REPAIR    ? INTRAVASCULAR PRESSURE WIRE/FFR STUDY N/A 10/10/2019  ?  Procedure: INTRAVASCULAR PRESSURE WIRE/FFR STUDY;  Surgeon: Lorretta Harp, MD;  Location: [place] [facility];  Service: Cardiovascular;  Laterality: N/A;  ? LEFT HEART CATH AND CORONARY ANGIOGRAPHY N/A 10/10/2019  ? Procedure: LEFT HEART CATH AND CORONARY ANGIOGRAPHY;  Surgeon: Lorretta Harp, MD;  Location: [place] [facility];  Service: Cardiovascular;  Laterality: N/A;  ? LEFT HEART CATHETERIZATION WITH CORONARY ANGIOGRAM N/A 01/28/2012  ? Procedure: LEFT HEART CATHETERIZATION WITH CORONARY ANGIOGRAM;  Surgeon: Wellington [place], MD;  Location: [place] C[facility];  Service: Cardiovascular;  Laterality: N/A;  ? LEFT HEART CATHETERIZATION WITH CORONARY ANGIOGRAM N/A 03/17/2014  ? Procedure: LEFT HEART CATHETERIZATION WITH CORONARY ANGIOGRAM;  Surgeon: Harrell Gave  Santina Evans, MD;  Location: [place] C[facility] LAB;  Service: Cardiovascular;  Laterality: N/A;  ? ORIF TIBIA & FIBULA FRACTURES    ? PTCA    ? SHOULDER ARTHROSCOPY WITH DISTAL CLAVICLE RESECTION Left 12/16/2018  ? Procedure: LEFT SHOULDER ARTHROSCOPY WITH DISTAL CLAVICLE RESECTION;  Surgeon: Hiram Gash, MD;  Location: [place];  Service: Orthopedics;  Laterality: Left;  ? SHOULDER ARTHROSCOPY WITH ROTATOR CUFF REPAIR AND SUBACROMIAL DECOMPRESSION Left 12/16/2018  ? Procedure: LEFT SHOULDER ARTHROSCOPY WITH ROTATOR CUFF REPAIR AND SUBACROMIAL DECOMPRESSION, DEBRIDEMENT;  Surgeon: Hiram Gash, MD;  Location: [place];  Service: Orthopedics;  Laterality: Left;  ? SHOULDER ARTHROSCOPY WITH SUBACROMIAL DECOMPRESSION AND BICEP TENDON REPAIR Left 12/16/2018  ? Procedure: SHOULDER ARTHROSCOPY WITH SUBACROMIAL DECOMPRESSION AND BICEP TENDON REPAIR;  Surgeon: Hiram Gash, MD;  Location: [place];  Service: Orthopedics;  Laterality: Left;  ? VASECTOMY    ? ? ?Review of systems negative except as noted in HPI / PMHx or noted below: ? ?Review of Systems  ?Constitutional: Negative.   ?HENT: Negative.    ?Eyes: Negative.   ?Respiratory:  Negative.    ?Cardiovascular: Negative.   ?Gastrointestinal: Negative.   ?Genitourinary: Negative.   ?Musculoskeletal: Negative.   ?Skin: Negative.   ?Neurological: Negative.   ?Endo/Heme/Allergies: Negative.

## 2021-08-28 ENCOUNTER — Encounter: Payer: Self-pay | Admitting: Allergy and Immunology

## 2021-08-29 ENCOUNTER — Other Ambulatory Visit: Payer: Self-pay | Admitting: Internal Medicine

## 2021-08-29 DIAGNOSIS — E039 Hypothyroidism, unspecified: Secondary | ICD-10-CM

## 2021-09-19 ENCOUNTER — Ambulatory Visit (HOSPITAL_COMMUNITY)
Admission: RE | Admit: 2021-09-19 | Payer: No Typology Code available for payment source | Source: Ambulatory Visit | Attending: Cardiovascular Disease | Admitting: Cardiovascular Disease

## 2021-09-19 ENCOUNTER — Other Ambulatory Visit: Payer: Self-pay | Admitting: Internal Medicine

## 2021-09-19 DIAGNOSIS — G4700 Insomnia, unspecified: Secondary | ICD-10-CM

## 2021-09-20 ENCOUNTER — Ambulatory Visit (HOSPITAL_COMMUNITY)
Admission: RE | Admit: 2021-09-20 | Discharge: 2021-09-20 | Disposition: A | Discharge status: Home / Self Care | Payer: Medicare Other | Source: Ambulatory Visit | Attending: Cardiovascular Disease | Admitting: Cardiovascular Disease

## 2021-09-20 DIAGNOSIS — E7800 Pure hypercholesterolemia, unspecified: Secondary | ICD-10-CM

## 2021-09-20 DIAGNOSIS — I6522 Occlusion and stenosis of left carotid artery: Secondary | ICD-10-CM

## 2021-09-20 DIAGNOSIS — I25118 Atherosclerotic heart disease of native coronary artery with other forms of angina pectoris: Secondary | ICD-10-CM

## 2021-09-20 DIAGNOSIS — I493 Ventricular premature depolarization: Secondary | ICD-10-CM

## 2021-09-20 DIAGNOSIS — I779 Disorder of arteries and arterioles, unspecified: Secondary | ICD-10-CM

## 2021-09-20 DIAGNOSIS — I10 Essential (primary) hypertension: Secondary | ICD-10-CM

## 2021-09-20 NOTE — Telephone Encounter (Signed)
Last filled 12.6.22

## 2021-09-23 DIAGNOSIS — H349 Unspecified retinal vascular occlusion: Secondary | ICD-10-CM | POA: Diagnosis not present

## 2021-09-23 DIAGNOSIS — Z961 Presence of intraocular lens: Secondary | ICD-10-CM | POA: Diagnosis not present

## 2021-09-23 LAB — HM DIABETES EYE EXAM

## 2021-09-30 ENCOUNTER — Other Ambulatory Visit: Payer: Self-pay | Admitting: Internal Medicine

## 2021-09-30 DIAGNOSIS — E1142 Type 2 diabetes mellitus with diabetic polyneuropathy: Secondary | ICD-10-CM

## 2021-10-11 DIAGNOSIS — S91331A Puncture wound without foreign body, right foot, initial encounter: Secondary | ICD-10-CM | POA: Diagnosis not present

## 2021-11-10 ENCOUNTER — Encounter: Payer: Self-pay | Admitting: Internal Medicine

## 2021-11-10 DIAGNOSIS — G4700 Insomnia, unspecified: Secondary | ICD-10-CM

## 2021-11-11 MED ORDER — ALPRAZOLAM 0.25 MG PO TABS: 0.2500 mg | ORAL_TABLET | Freq: Every evening | ORAL | 0 refills | Status: DC | PRN

## 2021-11-21 DIAGNOSIS — M25511 Pain in right shoulder: Secondary | ICD-10-CM | POA: Diagnosis not present

## 2021-12-13 ENCOUNTER — Encounter: Payer: Self-pay | Admitting: Internal Medicine

## 2021-12-13 DIAGNOSIS — G4700 Insomnia, unspecified: Secondary | ICD-10-CM

## 2021-12-16 MED ORDER — ALPRAZOLAM 0.25 MG PO TABS: 0.2500 mg | ORAL_TABLET | Freq: Every evening | ORAL | 0 refills | Status: DC | PRN

## 2021-12-19 DIAGNOSIS — N401 Enlarged prostate with lower urinary tract symptoms: Secondary | ICD-10-CM | POA: Diagnosis not present

## 2021-12-19 DIAGNOSIS — R3915 Urgency of urination: Secondary | ICD-10-CM | POA: Diagnosis not present

## 2021-12-20 ENCOUNTER — Encounter: Payer: Self-pay | Admitting: Cardiovascular Disease

## 2021-12-30 ENCOUNTER — Other Ambulatory Visit: Payer: Self-pay | Admitting: Internal Medicine

## 2021-12-30 DIAGNOSIS — E1142 Type 2 diabetes mellitus with diabetic polyneuropathy: Secondary | ICD-10-CM

## 2021-12-30 NOTE — Telephone Encounter (Signed)
Last refill 09/30/21 Last office visit 01/08/20 Next visit 02/18/22  Okay to fill

## 2022-01-03 DIAGNOSIS — M25511 Pain in right shoulder: Secondary | ICD-10-CM | POA: Diagnosis not present

## 2022-01-23 ENCOUNTER — Other Ambulatory Visit: Payer: Self-pay | Admitting: Internal Medicine

## 2022-01-23 DIAGNOSIS — G4700 Insomnia, unspecified: Secondary | ICD-10-CM

## 2022-02-11 ENCOUNTER — Encounter: Payer: No Typology Code available for payment source | Admitting: Internal Medicine

## 2022-02-13 DIAGNOSIS — N1831 Chronic kidney disease, stage 3a: Secondary | ICD-10-CM | POA: Diagnosis not present

## 2022-02-13 LAB — BASIC METABOLIC PANEL
BUN: 21 (ref 4–21)
CO2: 21 (ref 13–22)
Chloride: 104 (ref 99–108)
Creatinine: 1.2 (ref 0.6–1.3)
Glucose: 99
Potassium: 5.2 mEq/L — AB (ref 3.5–5.1)
Sodium: 140 (ref 137–147)

## 2022-02-13 LAB — COMPREHENSIVE METABOLIC PANEL
Albumin: 4.4 (ref 3.5–5.0)
Calcium: 9.7 (ref 8.7–10.7)

## 2022-02-13 LAB — CBC AND DIFFERENTIAL
HCT: 38 — AB (ref 41–53)
Hemoglobin: 13.2 — AB (ref 13.5–17.5)
Platelets: 224 10*3/uL (ref 150–400)
WBC: 5.7

## 2022-02-13 LAB — CBC: RBC: 3.82 — AB (ref 3.87–5.11)

## 2022-02-17 DIAGNOSIS — I129 Hypertensive chronic kidney disease with stage 1 through stage 4 chronic kidney disease, or unspecified chronic kidney disease: Secondary | ICD-10-CM | POA: Diagnosis not present

## 2022-02-17 DIAGNOSIS — D631 Anemia in chronic kidney disease: Secondary | ICD-10-CM | POA: Diagnosis not present

## 2022-02-17 DIAGNOSIS — E1122 Type 2 diabetes mellitus with diabetic chronic kidney disease: Secondary | ICD-10-CM | POA: Diagnosis not present

## 2022-02-17 DIAGNOSIS — N1831 Chronic kidney disease, stage 3a: Secondary | ICD-10-CM | POA: Diagnosis not present

## 2022-02-18 ENCOUNTER — Encounter: Payer: Self-pay | Admitting: Internal Medicine

## 2022-02-18 ENCOUNTER — Ambulatory Visit (INDEPENDENT_AMBULATORY_CARE_PROVIDER_SITE_OTHER): Payer: No Typology Code available for payment source | Admitting: Internal Medicine

## 2022-02-18 VITALS — BP 120/70 | HR 70 | Temp 97.7°F | Ht 70.5 in | Wt 190.1 lb

## 2022-02-18 DIAGNOSIS — Z125 Encounter for screening for malignant neoplasm of prostate: Secondary | ICD-10-CM | POA: Diagnosis not present

## 2022-02-18 DIAGNOSIS — E785 Hyperlipidemia, unspecified: Secondary | ICD-10-CM

## 2022-02-18 DIAGNOSIS — E039 Hypothyroidism, unspecified: Secondary | ICD-10-CM

## 2022-02-18 DIAGNOSIS — I10 Essential (primary) hypertension: Secondary | ICD-10-CM

## 2022-02-18 DIAGNOSIS — E559 Vitamin D deficiency, unspecified: Secondary | ICD-10-CM

## 2022-02-18 DIAGNOSIS — E1121 Type 2 diabetes mellitus with diabetic nephropathy: Secondary | ICD-10-CM

## 2022-02-18 DIAGNOSIS — Z0000 Encounter for general adult medical examination without abnormal findings: Secondary | ICD-10-CM | POA: Diagnosis not present

## 2022-02-18 DIAGNOSIS — I2510 Atherosclerotic heart disease of native coronary artery without angina pectoris: Secondary | ICD-10-CM

## 2022-02-18 DIAGNOSIS — G4700 Insomnia, unspecified: Secondary | ICD-10-CM | POA: Diagnosis not present

## 2022-02-18 LAB — CBC WITH DIFFERENTIAL/PLATELET
Basophils Absolute: 0 10*3/uL (ref 0.0–0.1)
Basophils Relative: 0.7 % (ref 0.0–3.0)
Eosinophils Absolute: 0.2 10*3/uL (ref 0.0–0.7)
Eosinophils Relative: 2.6 % (ref 0.0–5.0)
HCT: 40.9 % (ref 39.0–52.0)
Hemoglobin: 13.8 g/dL (ref 13.0–17.0)
Lymphocytes Relative: 19.7 % (ref 12.0–46.0)
Lymphs Abs: 1.2 10*3/uL (ref 0.7–4.0)
MCHC: 33.7 g/dL (ref 30.0–36.0)
MCV: 104.2 fl — ABNORMAL HIGH (ref 78.0–100.0)
Monocytes Absolute: 0.6 10*3/uL (ref 0.1–1.0)
Monocytes Relative: 10.1 % (ref 3.0–12.0)
Neutro Abs: 4 10*3/uL (ref 1.4–7.7)
Neutrophils Relative %: 66.9 % (ref 43.0–77.0)
Platelets: 210 10*3/uL (ref 150.0–400.0)
RBC: 3.93 Mil/uL — ABNORMAL LOW (ref 4.22–5.81)
RDW: 13.3 % (ref 11.5–15.5)
WBC: 5.9 10*3/uL (ref 4.0–10.5)

## 2022-02-18 LAB — COMPREHENSIVE METABOLIC PANEL
ALT: 17 U/L (ref 0–53)
AST: 17 U/L (ref 0–37)
Albumin: 4.2 g/dL (ref 3.5–5.2)
Alkaline Phosphatase: 111 U/L (ref 39–117)
BUN: 22 mg/dL (ref 6–23)
CO2: 26 mEq/L (ref 19–32)
Calcium: 10 mg/dL (ref 8.4–10.5)
Chloride: 102 mEq/L (ref 96–112)
Creatinine, Ser: 1.43 mg/dL (ref 0.40–1.50)
GFR: 46.1 mL/min — ABNORMAL LOW (ref >60.00)
Glucose, Bld: 97 mg/dL (ref 70–99)
Potassium: 5.4 mEq/L — ABNORMAL HIGH (ref 3.5–5.1)
Sodium: 136 mEq/L (ref 135–145)
Total Bilirubin: 0.8 mg/dL (ref 0.2–1.2)
Total Protein: 6.6 g/dL (ref 6.0–8.3)

## 2022-02-18 LAB — TSH: TSH: 2.33 u[IU]/mL (ref 0.35–5.50)

## 2022-02-18 LAB — HEMOGLOBIN A1C: Hgb A1c MFr Bld: 6.7 % — ABNORMAL HIGH (ref 4.6–6.5)

## 2022-02-18 LAB — LIPID PANEL
Cholesterol: 139 mg/dL (ref 0–200)
HDL: 40.6 mg/dL (ref >39.00)
LDL Cholesterol: 61 mg/dL (ref 0–99)
NonHDL: 98
Total CHOL/HDL Ratio: 3
Triglycerides: 183 mg/dL — ABNORMAL HIGH (ref 0.0–149.0)
VLDL: 36.6 mg/dL (ref 0.0–40.0)

## 2022-02-18 LAB — MICROALBUMIN / CREATININE URINE RATIO
Creatinine,U: 55 mg/dL
Microalb Creat Ratio: 2.5 mg/g (ref 0.0–30.0)
Microalb, Ur: 1.4 mg/dL (ref 0.0–1.9)

## 2022-02-18 LAB — VITAMIN B12: Vitamin B-12: 381 pg/mL (ref 211–911)

## 2022-02-18 LAB — PSA: PSA: 2.88 ng/mL (ref 0.10–4.00)

## 2022-02-18 LAB — VITAMIN D 25 HYDROXY (VIT D DEFICIENCY, FRACTURES): VITD: 35.41 ng/mL (ref 30.00–100.00)

## 2022-02-18 MED ORDER — ALPRAZOLAM 0.25 MG PO TABS: 0.2500 mg | ORAL_TABLET | Freq: Every evening | ORAL | 3 refills | Status: DC | PRN

## 2022-02-18 NOTE — Progress Notes (Signed)
Established Patient Office Visit     CC/Reason for Visit: Annual preventive exam and subsequent Medicare wellness visit  HPI: Richard Ho is a 81 y.o. male who is coming in today for the above mentioned reasons. Past Medical History is significant for: Type 2 diabetes, hypertension, diabetic neuropathy, hypothyroidism, hyperlipidemia, coronary artery disease, asthma.  He has most of his care through the [place] system but likes to keep some private physicians in case of emergencies.  I last saw him in August 2021.  Since then he had a basal cell carcinoma removed above his right shoulder, he had an asthma exacerbation that required an ED evaluation as well as was diagnosed with lumbar spinal stenosis and has received 3 epidural steroid injections and physical therapy with significant improvement.  He has routine eye and dental care.  All immunizations are up-to-date.  He no longer pursues colonoscopies due to his age.  He is requesting labs today.   Past Medical/Surgical History: Past Medical History:  Diagnosis Date   Anemia    Asthma    Cataract    CKD (chronic kidney disease), stage III ([facility])    Coronary artery disease 1991   a) Angioplasty LAD 1991 [place] b) MI in 1995 at  [facility] in [place], med Rx c) Cath 10/2019 for NSTEMI - see report -> med rx.   Diabetes mellitus    GERD (gastroesophageal reflux disease)    Hearing loss    HTN (hypertension)    Hyperlipidemia    Hypothyroidism    Myocardial infarct ([facility])    Nephrolithiasis    Osteopenia    Premature atrial contractions    PVC's (premature ventricular contractions)    Snake bite     Past Surgical History:  Procedure Laterality Date   ANGIOPLASTY  1991   CARDIAC CATHETERIZATION  2005   Moderate LCx and LAD disease and severe diffuse RCA disease   CARDIAC CATHETERIZATION  01/2012   normal left main, 50% pLAD stenosis, dLAD mild luminal irregularities, D1 30-40% stenosis at take off; subtotally  occluded/severely diseased OM1, mid 80% stenosis in small OM2, long prox and mid RCA stenoses ranging from 60-95%, severely diseased PDA, distal PLSA occluded filling via L-R collateral; normal LV function; inferior lateral basal akinesis.   CHOLECYSTECTOMY  2012   EYE SURGERY  2010   cataract - right, has left done too   INGUINAL HERNIA REPAIR     INTRAVASCULAR PRESSURE WIRE/FFR STUDY N/A 10/10/2019   Procedure: INTRAVASCULAR PRESSURE WIRE/FFR STUDY;  Surgeon: Runell Gess, MD;  [facility];  Service: Cardiovascular;  Laterality: N/A;   LEFT HEART CATH AND CORONARY ANGIOGRAPHY N/A 10/10/2019   Procedure: LEFT HEART CATH AND CORONARY ANGIOGRAPHY;  Surgeon: Runell Gess, MD;  Location: [facility];  Service: Cardiovascular;  Laterality: N/A;   LEFT HEART CATHETERIZATION WITH CORONARY ANGIOGRAM N/A 01/28/2012   Procedure: LEFT HEART CATHETERIZATION WITH CORONARY ANGIOGRAM;  Surgeon: Iran Ouch, MD;  Location: [facility];  Service: Cardiovascular;  Laterality: N/A;   LEFT HEART CATHETERIZATION WITH CORONARY ANGIOGRAM N/A 03/17/2014   Procedure: LEFT HEART CATHETERIZATION WITH CORONARY ANGIOGRAM;  Surgeon: Kathleene Hazel, MD;  Location: [facility];  Service: Cardiovascular;  Laterality: N/A;   ORIF TIBIA & FIBULA FRACTURES     PTCA     SHOULDER ARTHROSCOPY WITH DISTAL CLAVICLE RESECTION Left 12/16/2018   Procedure: LEFT SHOULDER ARTHROSCOPY WITH DISTAL CLAVICLE RESECTION;  Surgeon: Bjorn Pippin, MD;  Location: [place];  Service: Orthopedics;  Laterality: Left;   SHOULDER ARTHROSCOPY WITH ROTATOR CUFF REPAIR AND SUBACROMIAL DECOMPRESSION Left 12/16/2018   Procedure: LEFT SHOULDER ARTHROSCOPY WITH ROTATOR CUFF REPAIR AND SUBACROMIAL DECOMPRESSION, DEBRIDEMENT;  Surgeon: Hiram Gash, MD;  Location: [place];  Service: Orthopedics;  Laterality: Left;   SHOULDER ARTHROSCOPY WITH SUBACROMIAL DECOMPRESSION AND BICEP TENDON REPAIR  Left 12/16/2018   Procedure: SHOULDER ARTHROSCOPY WITH SUBACROMIAL DECOMPRESSION AND BICEP TENDON REPAIR;  Surgeon: Hiram Gash, MD;  Location: [place];  Service: Orthopedics;  Laterality: Left;   VASECTOMY      Social History:  reports that he has never smoked. He has never used smokeless tobacco. He reports current alcohol use. He reports that he does not use drugs.  Allergies: Allergies  Allergen Reactions   Meloxicam Other (See Comments) and Anxiety    "jumping out of my skin" Pt feels anxiety attacks when on this medicine    Family History:  Family History  Problem Relation Age of Onset   Asthma Mother    Allergies Sister    Coronary artery disease Other    Colon cancer Neg Hx    Esophageal cancer Neg Hx    Stomach cancer Neg Hx    Rectal cancer Neg Hx      Current Outpatient Medications:    Albuterol (PROVENTIL IN), Inhale into the lungs., Disp: , Rfl:    albuterol (VENTOLIN HFA) 108 (90 Base) MCG/ACT inhaler, Inhale 2 puffs into the lungs every 4 (four) hours as needed for wheezing or shortness of breath., Disp: 18 g, Rfl: 1   aspirin EC 81 MG tablet, Take 81 mg by mouth daily., Disp: , Rfl:    atorvastatin (LIPITOR) 40 MG tablet, Take 1 tablet (40 mg total) by mouth daily., Disp: 90 tablet, Rfl: 3   Budeson-Glycopyrrol-Formoterol (BREZTRI AEROSPHERE) 160-9-4.8 MCG/ACT AERO, 2 puffs 1-2 times per day with spacer., Disp: 10.7 g, Rfl: 5   Capsaicin 0.1 % CREA, APPLY SMALL AMOUNT TO AFFECTED AREA DAILY AS NEEDED, Disp: , Rfl:    carvedilol (COREG) 6.25 MG tablet, Take 1 tablet (6.25 mg total) by mouth 2 (two) times daily with a meal., Disp: 180 tablet, Rfl: 1   Cholecalciferol (VITAMIN D3 PO), Take 1 tablet by mouth daily., Disp: , Rfl:    Cyanocobalamin (B-12 PO), Take 1 tablet by mouth daily., Disp: , Rfl:    empagliflozin (JARDIANCE) 25 MG TABS tablet, Take 0.5 tablets by mouth every morning. Pt take 12.5 mg once  a day., Disp: , Rfl:    ferrous  sulfate 325 (65 FE) MG EC tablet, Take 325 mg by mouth daily. , Disp: , Rfl:    fluticasone (FLONASE) 50 MCG/ACT nasal spray, USE 1 TO 2 SPRAY(S) IN EACH NOSTRIL ONCE DAILY AS DIRECTED, Disp: 48 g, Rfl: 1   fluticasone (FLOVENT HFA) 110 MCG/ACT inhaler, Inhale 3 puffs into the lungs in the morning, at noon, and at bedtime., Disp: 1 each, Rfl: 5   furosemide (LASIX) 20 MG tablet, Take 20 mg by mouth daily as needed., Disp: , Rfl:    ipratropium (ATROVENT HFA) 17 MCG/ACT inhaler, Inhale 2 puffs into the lungs every 4 (four) hours as needed for wheezing., Disp: 12.9 g, Rfl: 1   ipratropium (ATROVENT) 0.06 % nasal spray, 2 sprays each nostril every 6 hours., Disp: 15 mL, Rfl: 5   isosorbide mononitrate (IMDUR) 30 MG 24 hr tablet, TAKE ONE TABLET BY MOUTH TWICE DAILY, Disp:  180 tablet, Rfl: 3   levothyroxine (SYNTHROID) 100 MCG tablet, Take 1 tablet by mouth once daily, Disp: 90 tablet, Rfl: 1   nitroGLYCERIN (NITROSTAT) 0.4 MG SL tablet, Place 1 tablet (0.4 mg total) under the tongue every 5 (five) minutes x 3 doses as needed for chest pain., Disp: 25 tablet, Rfl: 3   olopatadine (PATANOL) 0.1 % ophthalmic solution, Place 2 drops into both eyes 2 (two) times daily., Disp: 5 mL, Rfl: 5   ondansetron (ZOFRAN) 8 MG tablet, Take 8 mg by mouth every 8 (eight) hours as needed for nausea or vomiting. , Disp: , Rfl:    pantoprazole (PROTONIX) 40 MG tablet, Take 1 tablet (40 mg total) by mouth daily. (Patient taking differently: Take 40 mg by mouth 2 (two) times daily.), Disp: 90 tablet, Rfl: 1   pregabalin (LYRICA) 75 MG capsule, Take 1 capsule by mouth twice daily, Disp: 180 capsule, Rfl: 0   tamsulosin (FLOMAX) 0.4 MG CAPS capsule, TAKE ONE CAPSULE BY MOUTH DAILY, Disp: 30 capsule, Rfl: 5   ALPRAZolam (XANAX) 0.25 MG tablet, Take 1 tablet (0.25 mg total) by mouth at bedtime as needed. for sleep, Disp: 30 tablet, Rfl: 3  Review of Systems:  Constitutional: Denies fever, chills, diaphoresis, appetite change and  fatigue.  HEENT: Denies photophobia, eye pain, redness, hearing loss, ear pain, congestion, sore throat, rhinorrhea, sneezing, mouth sores, trouble swallowing, neck pain, neck stiffness and tinnitus.   Respiratory: Denies SOB, DOE, cough, chest tightness,  and wheezing.   Cardiovascular: Denies chest pain, palpitations and leg swelling.  Gastrointestinal: Denies nausea, vomiting, abdominal pain, diarrhea, constipation, blood in stool and abdominal distention.  Genitourinary: Denies dysuria, urgency, frequency, hematuria, flank pain and difficulty urinating.  Endocrine: Denies: hot or cold intolerance, sweats, changes in hair or nails, polyuria, polydipsia. Musculoskeletal: Denies myalgias, back pain, joint swelling, arthralgias and gait problem.  Skin: Denies pallor, rash and wound.  Neurological: Denies dizziness, seizures, syncope, weakness, light-headedness, numbness and headaches.  Hematological: Denies adenopathy. Easy bruising, personal or family bleeding history  Psychiatric/Behavioral: Denies suicidal ideation, mood changes, confusion, nervousness, sleep disturbance and agitation    Physical Exam: Vitals:   02/18/22 1039  BP: 120/70  Pulse: 70  Temp: 97.7 F (36.5 C)  TempSrc: Oral  SpO2: 96%  Weight: 190 lb 1.6 oz (86.2 kg)  Height: 5' 10.5" (1.791 m)    Body mass index is 26.89 kg/m.   Constitutional: NAD, calm, comfortable Eyes: PERRL, lids and conjunctivae normal ENMT: Mucous membranes are moist. Posterior pharynx clear of any exudate or lesions. Normal dentition. Tympanic membrane is pearly white, no erythema or bulging. Neck: normal, supple, no masses, no thyromegaly Respiratory: clear to auscultation bilaterally, no wheezing, no crackles. Normal respiratory effort. No accessory muscle use.  Cardiovascular: Regular rate and rhythm, no murmurs / rubs / gallops. No extremity edema. 2+ pedal pulses. No carotid bruits.  Abdomen: no tenderness, no masses palpated. No  hepatosplenomegaly. Bowel sounds positive.  Musculoskeletal: no clubbing / cyanosis. No joint deformity upper and lower extremities. Good ROM, no contractures. Normal muscle tone.  Skin: no rashes, lesions, ulcers. No induration Neurologic: CN 2-12 grossly intact. Sensation intact, DTR normal. Strength 5/5 in all 4.  Psychiatric: Normal judgment and insight. Alert and oriented x 3. Normal mood.    Subsequent Medicare wellness visit   1. Risk factors, based on past  M,S,F -cardiovascular disease risk factors include history of diabetes hypertension, hyperlipidemia and known coronary artery disease   2.  Physical activities: Stays  active with activities of daily living   3.  Depression/mood: Stable, not depressed   4.  Hearing: No perceived issues   5.  ADL's: Independent in all ADLs   6.  Fall risk: Low fall risk   7.  Home safety: No problems identified   8.  Height weight, and visual acuity: height and weight as above, vision:  Vision Screening   Right eye Left eye Both eyes  Without correction     With correction 20/32 20/20 20/20      9.  Counseling: We have discussed healthy lifestyle changes   10. Lab orders based on risk factors: Laboratory update will be reviewed   11. Referral : None today   12. Care plan: Follow-up with me in 1 year or sooner as needed   13. Cognitive assessment: No cognitive impairment   14. Screening: Patient provided with a written and personalized 5-10 year screening schedule in the AVS. yes   15. Provider List Update: Multitude of [place] physicians including cardiology, nephrology, neurosurgery, orthopedics, dermatology, ophthalmology.  16. Advance Directives: Full code   17. Opioids: Patient is not on any opioid prescriptions and has no risk factors for a substance use disorder.   [place] Office Visit from 02/18/2022 in [place] at [place]  PHQ-9 Total Score 3          10/10/2019    7:46 AM 10/10/2019    8:00 PM  10/11/2019   10:00 AM 11/12/2020    4:35 AM 02/18/2022   11:07 AM  Fall Risk  Falls in the past year?     0  Was there an injury with Fall?     0  Fall Risk Category Calculator     0  Fall Risk Category     Low  Patient Fall Risk Level Moderate fall risk Moderate fall risk Moderate fall risk Low fall risk Low fall risk  Patient at Risk for Falls Due to     No Fall Risks  Fall risk Follow up     Falls evaluation completed     Impression and Plan:  Encounter for preventive health examination - Plan: PSA  Type 2 diabetes mellitus with diabetic nephropathy, without long-term current use of insulin ([place]) - Plan: CBC with Differential/Platelet, Comprehensive metabolic panel, Hemoglobin A1c, Microalbumin / creatinine urine ratio, CANCELED: POCT glycosylated hemoglobin (Hb A1C)  Insomnia, unspecified type - Plan: ALPRAZolam (XANAX) 0.25 MG tablet  Vitamin D deficiency - Plan: Vitamin B12, VITAMIN D 25 Hydroxy (Vit-D Deficiency, Fractures)  Acquired hypothyroidism  Essential hypertension - Plan: TSH  Atherosclerosis of native coronary artery of native heart without angina pectoris  Dyslipidemia - Plan: Lipid panel   -Recommend routine eye and dental care. -Immunizations: All immunizations are up-to-date -Healthy lifestyle discussed in detail. -Labs to be updated today. -Colon cancer screening: No further due to age -Breast cancer screening: Not applicable -Cervical cancer screening: Not applicable -Lung cancer screening: Not applicable -Prostate cancer screening: Not applicable -DEXA: Not applicable   Aftin Lye Isaac Bliss, MD [facility]

## 2022-02-20 ENCOUNTER — Other Ambulatory Visit: Payer: Self-pay | Admitting: Internal Medicine

## 2022-02-20 ENCOUNTER — Encounter: Payer: Self-pay | Admitting: Internal Medicine

## 2022-02-20 DIAGNOSIS — D7589 Other specified diseases of blood and blood-forming organs: Secondary | ICD-10-CM | POA: Insufficient documentation

## 2022-03-04 ENCOUNTER — Other Ambulatory Visit: Payer: Self-pay | Admitting: *Deleted

## 2022-03-04 ENCOUNTER — Ambulatory Visit: Payer: Medicare Other | Admitting: Allergy and Immunology

## 2022-03-04 VITALS — BP 116/60 | HR 79 | Temp 98.7°F | Resp 16 | Ht 70.5 in | Wt 192.4 lb

## 2022-03-04 DIAGNOSIS — K219 Gastro-esophageal reflux disease without esophagitis: Secondary | ICD-10-CM | POA: Diagnosis not present

## 2022-03-04 DIAGNOSIS — J449 Chronic obstructive pulmonary disease, unspecified: Secondary | ICD-10-CM

## 2022-03-04 DIAGNOSIS — G4733 Obstructive sleep apnea (adult) (pediatric): Secondary | ICD-10-CM

## 2022-03-04 DIAGNOSIS — J3089 Other allergic rhinitis: Secondary | ICD-10-CM

## 2022-03-04 DIAGNOSIS — R718 Other abnormality of red blood cells: Secondary | ICD-10-CM

## 2022-03-04 MED ORDER — PANTOPRAZOLE SODIUM 40 MG PO TBEC: 40.0000 mg | DELAYED_RELEASE_TABLET | Freq: Two times a day (BID) | ORAL | 1 refills | Status: DC

## 2022-03-04 MED ORDER — SPACER/AERO-HOLDING CHAMBERS DEVI: 1.0000 | 1 refills | Status: AC

## 2022-03-04 MED ORDER — IPRATROPIUM BROMIDE 0.06 % NA SOLN: 2.0000 | Freq: Three times a day (TID) | NASAL | 5 refills | Status: DC

## 2022-03-04 MED ORDER — BREZTRI AEROSPHERE 160-9-4.8 MCG/ACT IN AERO: INHALATION_SPRAY | RESPIRATORY_TRACT | 5 refills | Status: DC

## 2022-03-04 MED ORDER — FLUTICASONE PROPIONATE 50 MCG/ACT NA SUSP: NASAL | 1 refills | Status: DC

## 2022-03-04 MED ORDER — FLUTICASONE PROPIONATE HFA 110 MCG/ACT IN AERO: 3.0000 | INHALATION_SPRAY | Freq: Three times a day (TID) | RESPIRATORY_TRACT | 5 refills | Status: DC

## 2022-03-04 MED ORDER — ALBUTEROL SULFATE HFA 108 (90 BASE) MCG/ACT IN AERS: 2.0000 | INHALATION_SPRAY | RESPIRATORY_TRACT | 1 refills | Status: DC | PRN

## 2022-03-04 MED ORDER — FEXOFENADINE HCL 180 MG PO TABS: 180.0000 mg | ORAL_TABLET | Freq: Every day | ORAL | 5 refills | Status: DC | PRN

## 2022-03-04 NOTE — Patient Instructions (Signed)
  1. Continue to Perform Allergen avoidance measures - dust mite, pollens, molds  2. Continue to Treat and prevent inflammation:   A. Breztri - 2 inhalations 1-2 times per day with spacer  B. Flonase - 1 spray each nostril 1-2 times per day  3. Continue treatment for reflux:  A. Protonix 40 mg 1-2 times a day   4. If needed:   A. Albuterol HFA 2 puffs every 4-6 hours  B. Atrovent - 2 puffs every 6 hours  C. OTC antihistamine - Claritin/Zyrtec/Allegra  D. nasal ipratropium 0.06% 2 sprays each nostril every 6 hours  E. Pataday - 1 drop each eye 1 time per day  5. "Action plan" for asthma flare up:   A. continue Breztri  B. add Flovent 110 3 inhalations 3 times a day with spacer  C. use Proventil or Ventolin HFA if needed  6. Return to clinic in 6 months or earlier if problem  7.  Obtain flu vaccine and RSV vaccine

## 2022-03-04 NOTE — Progress Notes (Unsigned)
[place] - [place] - [place] - [place] - [name]   Follow-up Note  Referring Provider: Philip Aspen, Almira Bar* Primary Provider: Philip Aspen, Limmie Patricia, MD Date of Office Visit: 03/04/2022  Subjective:   Richard Ho (DOB: 09-Nov-1940) is a 81 y.o. male who [facility] in re-evaluation of the following:  HPI: Wilkie Aye returns to this clinic in evaluation of COPD/asthma overlap, allergic rhinoconjunctivitis, reflux, untreated sleep apnea.  His last visit to this clinic with me was 27 August 2021.  He has lost 30 pounds of weight because of a radiculopathy issue and the use of hydrocodone and he feels much better at this point in time regarding his respiratory tract issue and his sleeping issue and his reflux issue.  He continues to use a triple inhaler mostly just 1 time per day on a consistent basis and rarely uses a short acting bronchodilator.  His nose is doing very well while using Flonase and nasal ipratropium.  He is not using enough nasal ipratropium to give him dryness throughout the day but when he does use this agent it does result in dryness.  He has not required a systemic steroid or antibiotic to treat any type of airway issue.  He believes that his reflux is under good control using a proton pump inhibitor.  He still does not treat his sleep apnea.  However, he can now sleep 7 hours and feels great during the daytime ever since he lost his weight.  Allergies as of 03/04/2022       Reactions   Meloxicam Other (See Comments), Anxiety   "jumping out of my skin" Pt feels anxiety attacks when on this medicine        Medication List        Accurate as of March 04, 2022  9:17 AM. If you have any questions, ask your nurse or doctor.          STOP taking these medications    furosemide 20 MG tablet Commonly known as: LASIX       TAKE these medications    albuterol 108 (90 Base) MCG/ACT  inhaler Commonly known as: VENTOLIN HFA Inhale 2 puffs into the lungs every 4 (four) hours as needed for wheezing or shortness of breath.   ALPRAZolam 0.25 MG tablet Commonly known as: XANAX Take 1 tablet (0.25 mg total) by mouth at bedtime as needed. for sleep   aspirin EC 81 MG tablet Take 81 mg by mouth daily.   atorvastatin 40 MG tablet Commonly known as: LIPITOR Take 1 tablet (40 mg total) by mouth daily.   Atrovent HFA 17 MCG/ACT inhaler Generic drug: ipratropium Inhale 2 puffs into the lungs every 4 (four) hours as needed for wheezing.   B-12 PO Take 1 tablet by mouth daily.   Breztri Aerosphere 160-9-4.8 MCG/ACT Aero Generic drug: Budeson-Glycopyrrol-Formoterol 2 puffs 1-2 times per day with spacer.   Capsaicin 0.1 % Crea APPLY SMALL AMOUNT TO AFFECTED AREA DAILY AS NEEDED   carvedilol 6.25 MG tablet Commonly known as: COREG Take 1 tablet (6.25 mg total) by mouth 2 (two) times daily with a meal.   empagliflozin 25 MG Tabs tablet Commonly known as: JARDIANCE Take 0.5 tablets by mouth every morning. Pt take 12.5 mg once  a day.   ferrous sulfate 325 (65 FE) MG EC tablet Take 325 mg by mouth daily.   fluticasone 110 MCG/ACT inhaler Commonly known as: Flovent HFA Inhale 3 puffs into  the lungs in the morning, at noon, and at bedtime.   fluticasone 50 MCG/ACT nasal spray Commonly known as: FLONASE USE 1 TO 2 SPRAY(S) IN EACH NOSTRIL ONCE DAILY AS DIRECTED   ipratropium 0.06 % nasal spray Commonly known as: ATROVENT 2 sprays each nostril every 6 hours.   isosorbide mononitrate 30 MG 24 hr tablet Commonly known as: IMDUR TAKE ONE TABLET BY MOUTH TWICE DAILY   levothyroxine 100 MCG tablet Commonly known as: SYNTHROID Take 1 tablet by mouth once daily   nitroGLYCERIN 0.4 MG SL tablet Commonly known as: NITROSTAT Place 1 tablet (0.4 mg total) under the tongue every 5 (five) minutes x 3 doses as needed for chest pain.   olopatadine 0.1 % ophthalmic  solution Commonly known as: PATANOL Place 2 drops into both eyes 2 (two) times daily.   ondansetron 8 MG tablet Commonly known as: ZOFRAN Take 8 mg by mouth every 8 (eight) hours as needed for nausea or vomiting.   pantoprazole 40 MG tablet Commonly known as: PROTONIX Take 1 tablet (40 mg total) by mouth daily. What changed: when to take this   pregabalin 75 MG capsule Commonly known as: LYRICA Take 1 capsule by mouth twice daily   PROVENTIL IN Inhale into the lungs.   tamsulosin 0.4 MG Caps capsule Commonly known as: FLOMAX TAKE ONE CAPSULE BY MOUTH DAILY   VITAMIN D3 PO Take 1 tablet by mouth daily.        Past Medical History:  Diagnosis Date   Anemia    Asthma    Cataract    CKD (chronic kidney disease), stage III ([place])    Coronary artery disease 1991   a) Angioplasty LAD 1991 [place] b) MI in 1995 at  [facility] in [place], med Rx c) Cath 10/2019 for NSTEMI - see report -> med rx.   Diabetes mellitus    GERD (gastroesophageal reflux disease)    Hearing loss    HTN (hypertension)    Hyperlipidemia    Hypothyroidism    Myocardial infarct (HCC)    Nephrolithiasis    Osteopenia    Premature atrial contractions    PVC's (premature ventricular contractions)    Snake bite     Past Surgical History:  Procedure Laterality Date   ANGIOPLASTY  1991   CARDIAC CATHETERIZATION  2005   Moderate LCx and LAD disease and severe diffuse RCA disease   CARDIAC CATHETERIZATION  01/2012   normal left main, 50% pLAD stenosis, dLAD mild luminal irregularities, D1 30-40% stenosis at take off; subtotally occluded/severely diseased OM1, mid 80% stenosis in small OM2, long prox and mid RCA stenoses ranging from 60-95%, severely diseased PDA, distal PLSA occluded filling via L-R collateral; normal LV function; inferior lateral basal akinesis.   CHOLECYSTECTOMY  2012   EYE SURGERY  2010   cataract - right, has left done too   INGUINAL HERNIA REPAIR     INTRAVASCULAR  PRESSURE WIRE/FFR STUDY N/A 10/10/2019   Procedure: INTRAVASCULAR PRESSURE WIRE/FFR STUDY;  Surgeon: Lorretta Harp, MD;  Location: [place] [facility];  Service: Cardiovascular;  Laterality: N/A;   LEFT HEART CATH AND CORONARY ANGIOGRAPHY N/A 10/10/2019   Procedure: LEFT HEART CATH AND CORONARY ANGIOGRAPHY;  Surgeon: Lorretta Harp, MD;  Location: [place] [facility];  Service: Cardiovascular;  Laterality: N/A;   LEFT HEART CATHETERIZATION WITH CORONARY ANGIOGRAM N/A 01/28/2012   Procedure: LEFT HEART CATHETERIZATION WITH CORONARY ANGIOGRAM;  Surgeon: Wellington [place], MD;  Location: [place] C[facility];  Service: Cardiovascular;  Laterality: N/A;   LEFT HEART CATHETERIZATION WITH CORONARY ANGIOGRAM N/A 03/17/2014   Procedure: LEFT HEART CATHETERIZATION WITH CORONARY ANGIOGRAM;  Surgeon: Burnell Blanks, MD;  Location: [facility] CATH LAB;  Service: Cardiovascular;  Laterality: N/A;   ORIF TIBIA & FIBULA FRACTURES     PTCA     SHOULDER ARTHROSCOPY WITH DISTAL CLAVICLE RESECTION Left 12/16/2018   Procedure: LEFT SHOULDER ARTHROSCOPY WITH DISTAL CLAVICLE RESECTION;  Surgeon: Hiram Gash, MD;  Location: [place];  Service: Orthopedics;  Laterality: Left;   SHOULDER ARTHROSCOPY WITH ROTATOR CUFF REPAIR AND SUBACROMIAL DECOMPRESSION Left 12/16/2018   Procedure: LEFT SHOULDER ARTHROSCOPY WITH ROTATOR CUFF REPAIR AND SUBACROMIAL DECOMPRESSION, DEBRIDEMENT;  Surgeon: Hiram Gash, MD;  Location: [place];  Service: Orthopedics;  Laterality: Left;   SHOULDER ARTHROSCOPY WITH SUBACROMIAL DECOMPRESSION AND BICEP TENDON REPAIR Left 12/16/2018   Procedure: SHOULDER ARTHROSCOPY WITH SUBACROMIAL DECOMPRESSION AND BICEP TENDON REPAIR;  Surgeon: Hiram Gash, MD;  Location: [place];  Service: Orthopedics;  Laterality: Left;   VASECTOMY      Review of systems negative except as noted in HPI / PMHx or noted below:  ROS   Objective:   Vitals:   03/04/22 0858  BP:  116/60  Pulse: 79  Resp: 16  Temp: 98.7 F (37.1 C)  SpO2: 96%   Height: 5' 10.5" (179.1 cm)  Weight: 192 lb 6.4 oz (87.3 kg)   Physical Exam  Diagnostics:    Spirometry was performed and demonstrated an FEV1 of 1.63 at 57 % of predicted.  Assessment and Plan:   1. COPD with asthma ([place])   2. Other allergic rhinitis   3. Gastroesophageal reflux disease, unspecified whether esophagitis present   4. Obstructive sleep apnea     1. Continue to Perform Allergen avoidance measures - dust mite, pollens, molds  2. Continue to Treat and prevent inflammation:   A. Breztri - 2 inhalations 1-2 times per day with spacer  B. Flonase - 1 spray each nostril 1-2 times per day  3. Continue treatment for reflux:  A. Protonix 40 mg 1-2 times a day   4. If needed:   A. Albuterol HFA 2 puffs every 4-6 hours  B. Atrovent - 2 puffs every 6 hours  C. OTC antihistamine - Claritin/Zyrtec/Allegra  D. nasal ipratropium 0.06% 2 sprays each nostril every 6 hours  E. Pataday - 1 drop each eye 1 time per day  5. "Action plan" for asthma flare up:   A. continue Breztri  B. add Flovent 110 3 inhalations 3 times a day with spacer  C. use Proventil or Ventolin HFA if needed  6. Return to clinic in 6 months or earlier if problem  7.  Obtain flu vaccine and RSV vaccine  Purcell Nails is really doing very well.  He has a good understanding about his disease state and how his medications work and appropriate dosing of his medications depending on disease activity and he will remain on anti-inflammatory agents for his airway and therapy directed against reflux as noted above.  He has a selection of agents to be utilized should they be required and he has a "action plan" for an asthma flareup in the future.  I have encouraged him to obtain the flu vaccine and the RSV vaccine.  I will see him back in this clinic in 6 months or earlier if there is a problem.   Allena Katz, MD Allergy / Immunology [place]  [facility]

## 2022-03-05 ENCOUNTER — Encounter: Payer: Self-pay | Admitting: Internal Medicine

## 2022-03-05 ENCOUNTER — Other Ambulatory Visit (INDEPENDENT_AMBULATORY_CARE_PROVIDER_SITE_OTHER): Payer: Medicare Other

## 2022-03-05 ENCOUNTER — Encounter: Payer: Self-pay | Admitting: Allergy and Immunology

## 2022-03-05 DIAGNOSIS — R718 Other abnormality of red blood cells: Secondary | ICD-10-CM

## 2022-03-05 DIAGNOSIS — D7589 Other specified diseases of blood and blood-forming organs: Secondary | ICD-10-CM

## 2022-03-05 LAB — VITAMIN B12: Vitamin B-12: 391 pg/mL (ref 211–911)

## 2022-03-05 LAB — FOLATE: Folate: >23.9 ng/mL (ref >5.9)

## 2022-03-13 ENCOUNTER — Encounter: Payer: Self-pay | Admitting: Internal Medicine

## 2022-03-26 ENCOUNTER — Telehealth: Payer: Self-pay | Admitting: *Deleted

## 2022-03-26 NOTE — Patient Outreach (Signed)
  Care Coordination   03/26/2022 Name: DYQUAN MINKS MRN: [id number] DOB: [birth year]   Care Coordination Outreach Attempts:  An unsuccessful telephone outreach was attempted today to offer the patient information about available care coordination services as a benefit of their health plan.   Follow Up Plan:  Additional outreach attempts will be made to offer the patient care coordination information and services.   Encounter Outcome:  No Answer  Care Coordination Interventions Activated:  No   Care Coordination Interventions:  No, not indicated     Raina Mina, RN Care Management Coordinator [place] Office [phone]

## 2022-04-01 ENCOUNTER — Telehealth: Payer: Self-pay | Admitting: *Deleted

## 2022-04-01 NOTE — Patient Outreach (Signed)
  Care Coordination   04/01/2022 Name: Richard Ho MRN: [id number] DOB: July 17, 1940   Care Coordination Outreach Attempts:  A second unsuccessful outreach was attempted today to offer the patient with information about available care coordination services as a benefit of their health plan.     Follow Up Plan:  Additional outreach attempts will be made to offer the patient care coordination information and services.   Encounter Outcome:  No Answer  Care Coordination Interventions Activated:  No   Care Coordination Interventions:  No, not indicated    Raina Mina, RN Care Management Coordinator [place] Office [phone]

## 2022-04-08 ENCOUNTER — Other Ambulatory Visit: Payer: Self-pay | Admitting: Internal Medicine

## 2022-04-08 DIAGNOSIS — E1142 Type 2 diabetes mellitus with diabetic polyneuropathy: Secondary | ICD-10-CM

## 2022-04-22 DIAGNOSIS — M25511 Pain in right shoulder: Secondary | ICD-10-CM | POA: Diagnosis not present

## 2022-04-22 DIAGNOSIS — M25512 Pain in left shoulder: Secondary | ICD-10-CM | POA: Diagnosis not present

## 2022-05-13 ENCOUNTER — Telehealth: Payer: Self-pay

## 2022-05-13 NOTE — Telephone Encounter (Signed)
---  Caller states he tested positive for covid this morning and he is having some chest tightness and his chin is itching. No fever  05/12/2022 11:25:20 AM Go to ED Now (or PCP triage) Kizzie Bane, RN, Angela  Referrals GO TO FACILITY UNDECIDED  05/13/22 1018 - Pt states he doesn't feel bad at all. Pt states he went to UC yesterday. They prescribed him Paxlovid. States chest tightness is "stable" & not bad. Pt advised to return to UC if SOB or fever develop. Pt verb understanding.

## 2022-06-17 ENCOUNTER — Telehealth: Payer: Self-pay

## 2022-06-17 NOTE — Telephone Encounter (Signed)
---  Caller states he feels lightheaded. Constant runny nose, green/yellow discharge and some blood when blowing his nose. Has non-allergenic Rhinitis. States left ear fills like it has fluid in it, denies pain in it. Pressure on top of head. Had Covid a month ago, was placed on Paxlovid. No fever.  06/16/2022 11:18:33 AM See PCP within 24 Hours Wilson, RN, Levada Dy  Comments User: Ivonne Andrew, RN Date/Time Eilene Ghazi Time): 06/16/2022 11:17:13 AM On flonase by allergy MD  Referrals REFERRED TO PCP OFFICE  Pt has appt with PCP on 06/18/22

## 2022-06-18 ENCOUNTER — Ambulatory Visit (INDEPENDENT_AMBULATORY_CARE_PROVIDER_SITE_OTHER): Payer: Medicare Other | Admitting: Internal Medicine

## 2022-06-18 ENCOUNTER — Encounter: Payer: Self-pay | Admitting: Internal Medicine

## 2022-06-18 VITALS — BP 120/74 | HR 82 | Temp 97.9°F | Wt 199.7 lb

## 2022-06-18 DIAGNOSIS — J0110 Acute frontal sinusitis, unspecified: Secondary | ICD-10-CM | POA: Diagnosis not present

## 2022-06-18 MED ORDER — AMOXICILLIN 500 MG PO CAPS: 500.0000 mg | ORAL_CAPSULE | Freq: Two times a day (BID) | ORAL | 0 refills | Status: AC

## 2022-06-18 MED ORDER — PREDNISONE 10 MG (21) PO TBPK: ORAL_TABLET | ORAL | 0 refills | Status: DC

## 2022-06-18 NOTE — Progress Notes (Signed)
Established Patient Office Visit     CC/Reason for Visit: Frontal pain and pressure  HPI: Richard Ho is a 82 y.o. male who is coming in today for the above mentioned reasons.  About a month ago he had a URI.  Ever since then he has been dealing with significant frontal headache, pain, pressure as well as thick, yellow and green nasal discharge.   Past Medical/Surgical History: Past Medical History:  Diagnosis Date   Anemia    Asthma    Cataract    CKD (chronic kidney disease), stage III ([place])    Coronary artery disease 1991   a) Angioplasty LAD 1991 [place] b) MI in 1995 at  [facility] in [place], med Rx c) Cath 10/2019 for NSTEMI - see report -> med rx.   Diabetes mellitus    GERD (gastroesophageal reflux disease)    Hearing loss    HTN (hypertension)    Hyperlipidemia    Hypothyroidism    Myocardial infarct (HCC)    Nephrolithiasis    Osteopenia    Premature atrial contractions    PVC's (premature ventricular contractions)    Snake bite     Past Surgical History:  Procedure Laterality Date   ANGIOPLASTY  1991   CARDIAC CATHETERIZATION  2005   Moderate LCx and LAD disease and severe diffuse RCA disease   CARDIAC CATHETERIZATION  01/2012   normal left main, 50% pLAD stenosis, dLAD mild luminal irregularities, D1 30-40% stenosis at take off; subtotally occluded/severely diseased OM1, mid 80% stenosis in small OM2, long prox and mid RCA stenoses ranging from 60-95%, severely diseased PDA, distal PLSA occluded filling via L-R collateral; normal LV function; inferior lateral basal akinesis.   CHOLECYSTECTOMY  2012   EYE SURGERY  2010   cataract - right, has left done too   INGUINAL HERNIA REPAIR     INTRAVASCULAR PRESSURE WIRE/FFR STUDY N/A 10/10/2019   Procedure: INTRAVASCULAR PRESSURE WIRE/FFR STUDY;  Surgeon: Lorretta Harp, MD;  Location: [place] CV [facility];  Service: Cardiovascular;  Laterality: N/A;   LEFT HEART CATH AND CORONARY ANGIOGRAPHY N/A  10/10/2019   Procedure: LEFT HEART CATH AND CORONARY ANGIOGRAPHY;  Surgeon: Lorretta Harp, MD;  Location: [place] CV [facility];  Service: Cardiovascular;  Laterality: N/A;   LEFT HEART CATHETERIZATION WITH CORONARY ANGIOGRAM N/A 01/28/2012   Procedure: LEFT HEART CATHETERIZATION WITH CORONARY ANGIOGRAM;  Surgeon: Wellington [place], MD;  Location: [place] CATH LAB;  Service: Cardiovascular;  Laterality: N/A;   LEFT HEART CATHETERIZATION WITH CORONARY ANGIOGRAM N/A 03/17/2014   Procedure: LEFT HEART CATHETERIZATION WITH CORONARY ANGIOGRAM;  Surgeon: Burnell Blanks, MD;  Location: [facility] CATH LAB;  Service: Cardiovascular;  Laterality: N/A;   ORIF TIBIA & FIBULA FRACTURES     PTCA     SHOULDER ARTHROSCOPY WITH DISTAL CLAVICLE RESECTION Left 12/16/2018   Procedure: LEFT SHOULDER ARTHROSCOPY WITH DISTAL CLAVICLE RESECTION;  Surgeon: Hiram Gash, MD;  Location: [place];  Service: Orthopedics;  Laterality: Left;   SHOULDER ARTHROSCOPY WITH ROTATOR CUFF REPAIR AND SUBACROMIAL DECOMPRESSION Left 12/16/2018   Procedure: LEFT SHOULDER ARTHROSCOPY WITH ROTATOR CUFF REPAIR AND SUBACROMIAL DECOMPRESSION, DEBRIDEMENT;  Surgeon: Hiram Gash, MD;  Location: [place];  Service: Orthopedics;  Laterality: Left;   SHOULDER ARTHROSCOPY WITH SUBACROMIAL DECOMPRESSION AND BICEP TENDON REPAIR Left 12/16/2018   Procedure: SHOULDER ARTHROSCOPY WITH SUBACROMIAL DECOMPRESSION AND BICEP TENDON REPAIR;  Surgeon: Hiram Gash, MD;  Location: [place];  Service: Orthopedics;  Laterality: Left;   VASECTOMY      Social History:  reports that he has never smoked. He has never used smokeless tobacco. He reports current alcohol use. He reports that he does not use drugs.  Allergies: Allergies  Allergen Reactions   Meloxicam Other (See Comments) and Anxiety    "jumping out of my skin" Pt feels anxiety attacks when on this medicine    Family History:  Family History  Problem  Relation Age of Onset   Asthma Mother    Allergies Sister    Coronary artery disease Other    Colon cancer Neg Hx    Esophageal cancer Neg Hx    Stomach cancer Neg Hx    Rectal cancer Neg Hx      Current Outpatient Medications:    Albuterol (PROVENTIL IN), Inhale into the lungs., Disp: , Rfl:    albuterol (VENTOLIN HFA) 108 (90 Base) MCG/ACT inhaler, Inhale 2 puffs into the lungs every 4 (four) hours as needed for wheezing or shortness of breath., Disp: 18 g, Rfl: 1   ALPRAZolam (XANAX) 0.25 MG tablet, Take 1 tablet (0.25 mg total) by mouth at bedtime as needed. for sleep, Disp: 30 tablet, Rfl: 3   amoxicillin (AMOXIL) 500 MG capsule, Take 1 capsule (500 mg total) by mouth 2 (two) times daily for 7 days., Disp: 14 capsule, Rfl: 0   aspirin EC 81 MG tablet, Take 81 mg by mouth daily., Disp: , Rfl:    atorvastatin (LIPITOR) 40 MG tablet, Take 1 tablet (40 mg total) by mouth daily., Disp: 90 tablet, Rfl: 3   Budeson-Glycopyrrol-Formoterol (BREZTRI AEROSPHERE) 160-9-4.8 MCG/ACT AERO, 2 inhalations 1-2 times per day with spacer, Disp: 10.7 g, Rfl: 5   Capsaicin 0.1 % CREA, APPLY SMALL AMOUNT TO AFFECTED AREA DAILY AS NEEDED, Disp: , Rfl:    carvedilol (COREG) 6.25 MG tablet, Take 1 tablet (6.25 mg total) by mouth 2 (two) times daily with a meal., Disp: 180 tablet, Rfl: 1   Cholecalciferol (VITAMIN D3 PO), Take 1 tablet by mouth daily., Disp: , Rfl:    empagliflozin (JARDIANCE) 25 MG TABS tablet, Take 0.5 tablets by mouth every morning. Pt take 12.5 mg once  a day., Disp: , Rfl:    ferrous sulfate 325 (65 FE) MG EC tablet, Take 325 mg by mouth daily. , Disp: , Rfl:    fexofenadine (ALLEGRA) 180 MG tablet, Take 1 tablet (180 mg total) by mouth daily as needed for allergies or rhinitis (Can take an extra dose during flare ups.)., Disp: 60 tablet, Rfl: 5   fluticasone (FLONASE) 50 MCG/ACT nasal spray, USE 1 TO 2 SPRAY(S) IN EACH NOSTRIL ONCE DAILY AS DIRECTED, Disp: 48 g, Rfl: 1   fluticasone  (FLOVENT HFA) 110 MCG/ACT inhaler, Inhale 3 puffs into the lungs in the morning, at noon, and at bedtime. During flare ups, Disp: 1 each, Rfl: 5   ipratropium (ATROVENT HFA) 17 MCG/ACT inhaler, Inhale 2 puffs into the lungs every 4 (four) hours as needed for wheezing., Disp: 12.9 g, Rfl: 1   ipratropium (ATROVENT) 0.06 % nasal spray, 2 sprays each nostril every 6 hours., Disp: 15 mL, Rfl: 5   ipratropium (ATROVENT) 0.06 % nasal spray, Place 2 sprays into both nostrils 3 (three) times daily., Disp: 15 mL, Rfl: 5   isosorbide mononitrate (IMDUR) 30 MG 24 hr tablet, TAKE ONE TABLET BY MOUTH TWICE DAILY, Disp: 180 tablet, Rfl: 3   levothyroxine (SYNTHROID) 100 MCG tablet, Take 1 tablet  by mouth once daily, Disp: 90 tablet, Rfl: 1   nitroGLYCERIN (NITROSTAT) 0.4 MG SL tablet, Place 1 tablet (0.4 mg total) under the tongue every 5 (five) minutes x 3 doses as needed for chest pain., Disp: 25 tablet, Rfl: 3   olopatadine (PATANOL) 0.1 % ophthalmic solution, Place 2 drops into both eyes 2 (two) times daily., Disp: 5 mL, Rfl: 5   ondansetron (ZOFRAN) 8 MG tablet, Take 8 mg by mouth every 8 (eight) hours as needed for nausea or vomiting. , Disp: , Rfl:    pantoprazole (PROTONIX) 40 MG tablet, Take 1 tablet (40 mg total) by mouth 2 (two) times daily., Disp: 180 tablet, Rfl: 1   predniSONE (STERAPRED UNI-PAK 21 TAB) 10 MG (21) TBPK tablet, Take as directed, Disp: 21 tablet, Rfl: 0   pregabalin (LYRICA) 75 MG capsule, Take 1 capsule by mouth twice daily, Disp: 180 capsule, Rfl: 0   Spacer/Aero-Holding Chambers DEVI, 1 Device by Does not apply route as directed., Disp: 1 each, Rfl: 1   tamsulosin (FLOMAX) 0.4 MG CAPS capsule, TAKE ONE CAPSULE BY MOUTH DAILY, Disp: 30 capsule, Rfl: 5  Review of Systems:  Constitutional: Denies fever, chills, diaphoresis, appetite change. HEENT: Denies photophobia, eye pain, redness, mouth sores, trouble swallowing, neck pain, neck stiffness and tinnitus.   Respiratory: Denies SOB,  DOE, cough, chest tightness,  and wheezing.   Cardiovascular: Denies chest pain, palpitations and leg swelling.  Gastrointestinal: Denies nausea, vomiting, abdominal pain, diarrhea, constipation, blood in stool and abdominal distention.  Genitourinary: Denies dysuria, urgency, frequency, hematuria, flank pain and difficulty urinating.  Endocrine: Denies: hot or cold intolerance, sweats, changes in hair or nails, polyuria, polydipsia. Musculoskeletal: Denies myalgias, back pain, joint swelling, arthralgias and gait problem.  Skin: Denies pallor, rash and wound.  Neurological: Denies dizziness, seizures, syncope, weakness, light-headedness, numbness and headaches.  Hematological: Denies adenopathy. Easy bruising, personal or family bleeding history  Psychiatric/Behavioral: Denies suicidal ideation, mood changes, confusion, nervousness, sleep disturbance and agitation    Physical Exam: Vitals:   06/18/22 1517  BP: 120/74  Pulse: 82  Temp: 97.9 F (36.6 C)  TempSrc: Oral  SpO2: 96%  Weight: 199 lb 11.2 oz (90.6 kg)    Body mass index is 28.25 kg/m.   Constitutional: NAD, calm, comfortable Eyes: PERRL, lids and conjunctivae normal, wears corrective lenses ENMT: Mucous membranes are moist. Posterior pharynx is erythematous but clear of any exudate or lesions. Normal dentition. Tympanic membrane is pearly white, no erythema or bulging. Respiratory: clear to auscultation bilaterally, no wheezing, no crackles. Normal respiratory effort. No accessory muscle use.  Cardiovascular: Regular rate and rhythm, no murmurs / rubs / gallops. No extremity edema.   Psychiatric: Normal judgment and insight. Alert and oriented x 3. Normal mood.    Impression and Plan:  Acute non-recurrent frontal sinusitis - Plan: amoxicillin (AMOXIL) 500 MG capsule, predniSONE (STERAPRED UNI-PAK 21 TAB) 10 MG (21) TBPK tablet  -Given thick, purulent nasal discharge as well as significant frontal pain and pressure,  I will send in a round of amoxicillin and prednisone, he knows to follow-up with [place] in 2 weeks or so if no improvement.  Time spent:32 minutes reviewing chart, interviewing and examining patient and formulating plan of care.     Chaya Jan, MD [facility]

## 2022-06-30 ENCOUNTER — Other Ambulatory Visit: Payer: Self-pay | Admitting: Internal Medicine

## 2022-06-30 DIAGNOSIS — G4700 Insomnia, unspecified: Secondary | ICD-10-CM

## 2022-07-01 DIAGNOSIS — D23122 Other benign neoplasm of skin of left lower eyelid, including canthus: Secondary | ICD-10-CM | POA: Diagnosis not present

## 2022-07-09 ENCOUNTER — Other Ambulatory Visit: Payer: Self-pay | Admitting: Internal Medicine

## 2022-07-09 DIAGNOSIS — E1142 Type 2 diabetes mellitus with diabetic polyneuropathy: Secondary | ICD-10-CM

## 2022-07-13 NOTE — Progress Notes (Unsigned)
No chief complaint on file.   History of Present Illness: 82 yo male with history of CAD, HTN, HLD and DM here today for cardiac follow up. He has a history of coronary angioplasty in [place] and in [place] in [place]. He had increased lower extremity edema in April 2021 and Lasix was started with improvement in edema.  Echo April 2021 with LVEF 55-60%, mild LVH, mild MR. Carotid artery dopplers April 2023 with mild bilateral carotid artery disease. He was admitted to [facility] May 2021 with chest pain, troponin 101. Cardiac cath May 2021 with CTO of the RCA, 95% OM stenosis, moderate LAD stenosis. Medical management was recommended. He was seen in our office 10/26/19 by Melina Copa, PA-C and c/o lightheadedness. 3 day cardiac monitor with frequent PVCs. He has been seen in the EP clinic by Dr. Rayann Heman 12/05/19. Plan was to continue the beta blocker.   He is here today for follow up. The patient denies any chest pain, dyspnea, palpitations, lower extremity edema, orthopnea, PND, dizziness, near syncope or syncope.   Primary Care Physician: Isaac Bliss, Rayford Halsted, MD  Past Medical History:  Diagnosis Date   Anemia    Asthma    Cataract    CKD (chronic kidney disease), stage III [facility])    Coronary artery disease 1991   a) Angioplasty LAD 1991 [place] b) MI in 1995 at  [facility] in [place], med Rx c) Cath 10/2019 for NSTEMI - see report -> med rx.   Diabetes mellitus    GERD (gastroesophageal reflux disease)    Hearing loss    HTN (hypertension)    Hyperlipidemia    Hypothyroidism    Myocardial infarct (HCC)    Nephrolithiasis    Osteopenia    Premature atrial contractions    PVC's (premature ventricular contractions)    Snake bite     Past Surgical History:  Procedure Laterality Date   ANGIOPLASTY  1991   CARDIAC CATHETERIZATION  2005   Moderate LCx and LAD disease and severe diffuse RCA disease   CARDIAC CATHETERIZATION  01/2012   normal left main, 50% pLAD stenosis, dLAD  mild luminal irregularities, D1 30-40% stenosis at take off; subtotally occluded/severely diseased OM1, mid 80% stenosis in small OM2, long prox and mid RCA stenoses ranging from 60-95%, severely diseased PDA, distal PLSA occluded filling via L-R collateral; normal LV function; inferior lateral basal akinesis.   CHOLECYSTECTOMY  2012   EYE SURGERY  2010   cataract - right, has left done too   INGUINAL HERNIA REPAIR     INTRAVASCULAR PRESSURE WIRE/FFR STUDY N/A 10/10/2019   Procedure: INTRAVASCULAR PRESSURE WIRE/FFR STUDY;  Surgeon: Lorretta Harp, MD;  Location: [place] CV [facility];  Service: Cardiovascular;  Laterality: N/A;   LEFT HEART CATH AND CORONARY ANGIOGRAPHY N/A 10/10/2019   Procedure: LEFT HEART CATH AND CORONARY ANGIOGRAPHY;  Surgeon: Lorretta Harp, MD;  Location: [place] [facility];  Service: Cardiovascular;  Laterality: N/A;   LEFT HEART CATHETERIZATION WITH CORONARY ANGIOGRAM N/A 01/28/2012   Procedure: LEFT HEART CATHETERIZATION WITH CORONARY ANGIOGRAM;  Surgeon: [place], MD;  Location: [place] CATH LAB;  Service: Cardiovascular;  Laterality: N/A;   LEFT HEART CATHETERIZATION WITH CORONARY ANGIOGRAM N/A 03/17/2014   Procedure: LEFT HEART CATHETERIZATION WITH CORONARY ANGIOGRAM;  Surgeon: Burnell Blanks, MD;  Location: [facility] CATH LAB;  Service: Cardiovascular;  Laterality: N/A;   ORIF TIBIA & FIBULA FRACTURES     PTCA     SHOULDER ARTHROSCOPY  WITH DISTAL CLAVICLE RESECTION Left 12/16/2018   Procedure: LEFT SHOULDER ARTHROSCOPY WITH DISTAL CLAVICLE RESECTION;  Surgeon: Hiram Gash, MD;  Location: [place];  Service: Orthopedics;  Laterality: Left;   SHOULDER ARTHROSCOPY WITH ROTATOR CUFF REPAIR AND SUBACROMIAL DECOMPRESSION Left 12/16/2018   Procedure: LEFT SHOULDER ARTHROSCOPY WITH ROTATOR CUFF REPAIR AND SUBACROMIAL DECOMPRESSION, DEBRIDEMENT;  Surgeon: Hiram Gash, MD;  Location: [place];  Service: Orthopedics;  Laterality: Left;    SHOULDER ARTHROSCOPY WITH SUBACROMIAL DECOMPRESSION AND BICEP TENDON REPAIR Left 12/16/2018   Procedure: SHOULDER ARTHROSCOPY WITH SUBACROMIAL DECOMPRESSION AND BICEP TENDON REPAIR;  Surgeon: Hiram Gash, MD;  Location: [place];  Service: Orthopedics;  Laterality: Left;   VASECTOMY      Current Outpatient Medications  Medication Sig Dispense Refill   Albuterol (PROVENTIL IN) Inhale into the lungs.     albuterol (VENTOLIN HFA) 108 (90 Base) MCG/ACT inhaler Inhale 2 puffs into the lungs every 4 (four) hours as needed for wheezing or shortness of breath. 18 g 1   ALPRAZolam (XANAX) 0.25 MG tablet TAKE 1 TABLET BY MOUTH AT BEDTIME AS NEEDED FOR SLEEP 30 tablet 0   aspirin EC 81 MG tablet Take 81 mg by mouth daily.     atorvastatin (LIPITOR) 40 MG tablet Take 1 tablet (40 mg total) by mouth daily. 90 tablet 3   Budeson-Glycopyrrol-Formoterol (BREZTRI AEROSPHERE) 160-9-4.8 MCG/ACT AERO 2 inhalations 1-2 times per day with spacer 10.7 g 5   Capsaicin 0.1 % CREA APPLY SMALL AMOUNT TO AFFECTED AREA DAILY AS NEEDED     carvedilol (COREG) 6.25 MG tablet Take 1 tablet (6.25 mg total) by mouth 2 (two) times daily with a meal. 180 tablet 1   Cholecalciferol (VITAMIN D3 PO) Take 1 tablet by mouth daily.     empagliflozin (JARDIANCE) 25 MG TABS tablet Take 0.5 tablets by mouth every morning. Pt take 12.5 mg once  a day.     ferrous sulfate 325 (65 FE) MG EC tablet Take 325 mg by mouth daily.      fexofenadine (ALLEGRA) 180 MG tablet Take 1 tablet (180 mg total) by mouth daily as needed for allergies or rhinitis (Can take an extra dose during flare ups.). 60 tablet 5   fluticasone (FLONASE) 50 MCG/ACT nasal spray USE 1 TO 2 SPRAY(S) IN EACH NOSTRIL ONCE DAILY AS DIRECTED 48 g 1   fluticasone (FLOVENT HFA) 110 MCG/ACT inhaler Inhale 3 puffs into the lungs in the morning, at noon, and at bedtime. During flare ups 1 each 5   ipratropium (ATROVENT HFA) 17 MCG/ACT inhaler Inhale 2 puffs into the  lungs every 4 (four) hours as needed for wheezing. 12.9 g 1   ipratropium (ATROVENT) 0.06 % nasal spray 2 sprays each nostril every 6 hours. 15 mL 5   ipratropium (ATROVENT) 0.06 % nasal spray Place 2 sprays into both nostrils 3 (three) times daily. 15 mL 5   isosorbide mononitrate (IMDUR) 30 MG 24 hr tablet TAKE ONE TABLET BY MOUTH TWICE DAILY 180 tablet 3   levothyroxine (SYNTHROID) 100 MCG tablet Take 1 tablet by mouth once daily 90 tablet 1   nitroGLYCERIN (NITROSTAT) 0.4 MG SL tablet Place 1 tablet (0.4 mg total) under the tongue every 5 (five) minutes x 3 doses as needed for chest pain. 25 tablet 3   olopatadine (PATANOL) 0.1 % ophthalmic solution Place 2 drops into both eyes 2 (two) times daily. 5 mL 5   ondansetron (ZOFRAN) 8 MG  tablet Take 8 mg by mouth every 8 (eight) hours as needed for nausea or vomiting.      pantoprazole (PROTONIX) 40 MG tablet Take 1 tablet (40 mg total) by mouth 2 (two) times daily. 180 tablet 1   predniSONE (STERAPRED UNI-PAK 21 TAB) 10 MG (21) TBPK tablet Take as directed 21 tablet 0   pregabalin (LYRICA) 75 MG capsule Take 1 capsule by mouth twice daily 180 capsule 0   Spacer/Aero-Holding Chambers DEVI 1 Device by Does not apply route as directed. 1 each 1   tamsulosin (FLOMAX) 0.4 MG CAPS capsule TAKE ONE CAPSULE BY MOUTH DAILY 30 capsule 5   No current facility-administered medications for this visit.    Allergies  Allergen Reactions   Meloxicam Other (See Comments) and Anxiety    "jumping out of my skin" Pt feels anxiety attacks when on this medicine    Social History   Socioeconomic History   Marital status: Married    Spouse name: Not on file   Number of children: Not on file   Years of education: Not on file   Highest education level: Bachelor's degree (e.g., BA, AB, BS)  Occupational History   Occupation: Retired  Tobacco Use   Smoking status: Never   Smokeless tobacco: Never  Vaping Use   Vaping Use: Never used  Substance and Sexual  Activity   Alcohol use: Yes    Alcohol/week: 0.0 standard drinks of alcohol    Comment: occ, once every 2 months   Drug use: No   Sexual activity: Not on file  Other Topics Concern   Not on file  Social History Narrative   Not on file   Social Determinants of Health   Financial Resource Strain: Low Risk  (06/16/2022)   Overall Financial Resource Strain (CARDIA)    Difficulty of Paying Living Expenses: Not hard at all  Food Insecurity: No Food Insecurity (06/16/2022)   Hunger Vital Sign    Worried About Running Out of Food in the Last Year: Never true    Ran Out of Food in the Last Year: Never true  Transportation Needs: No Transportation Needs (06/16/2022)   PRAPARE - Hydrologist (Medical): No    Lack of Transportation (Non-Medical): No  Physical Activity: Insufficiently Active (06/16/2022)   Exercise Vital Sign    Days of Exercise per Week: 3 days    Minutes of Exercise per Session: 20 min  Stress: No Stress Concern Present (06/16/2022)   [place]    Feeling of Stress : Only a little  Social Connections: Unknown (06/16/2022)   Social Connection and Isolation Panel [NHANES]    Frequency of Communication with Friends and Family: Three times a week    Frequency of Social Gatherings with Friends and Family: Twice a week    Attends Religious Services: Patient refused    Marine scientist or Organizations: Yes    Attends Music therapist: More than 4 times per year    Marital Status: Married  Human resources officer Violence: Not on file    Family History  Problem Relation Age of Onset   Asthma Mother    Allergies Sister    Coronary artery disease Other    Colon cancer Neg Hx    Esophageal cancer Neg Hx    Stomach cancer Neg Hx    Rectal cancer Neg Hx     Review of Systems:  As stated in  the HPI and otherwise negative.   There were no vitals taken for this  visit.  Physical Examination:  General: Well developed, well nourished, NAD  HEENT: OP clear, mucus membranes moist  SKIN: warm, dry. No rashes. Neuro: No focal deficits  Musculoskeletal: Muscle strength 5/5 all ext  Psychiatric: Mood and affect normal  Neck: No JVD, no carotid bruits, no thyromegaly, no lymphadenopathy.  Lungs:Clear bilaterally, no wheezes, rhonci, crackles Cardiovascular: Regular rate and rhythm. No murmurs, gallops or rubs. Abdomen:Soft. Bowel sounds present. Non-tender.  Extremities: No lower extremity edema. Pulses are 2 + in the bilateral DP/PT.  Echo April 2021:   1. Left ventricular ejection fraction, by estimation, is 55 to 60%. The  left ventricle has normal function. The left ventricle demonstrates  regional wall motion abnormalities (see scoring diagram/findings for  description). There is mild concentric left  ventricular hypertrophy. Left ventricular diastolic parameters are  consistent with Grade I diastolic dysfunction (impaired relaxation).   2. Right ventricular systolic function is normal. The right ventricular  size is normal.   3. Left atrial size was mildly dilated.   4. The mitral valve is normal in structure. Mild mitral valve  regurgitation. No evidence of mitral stenosis.   5. The aortic valve is normal in structure. Aortic valve regurgitation is  not visualized. No aortic stenosis is present.   6. The inferior vena cava is normal in size with greater than 50%  respiratory variability, suggesting right atrial pressure of 3 mmHg.    EKG:  EKG is not ordered today. The ekg ordered today demonstrates   Recent Labs: 02/18/2022: ALT 17; BUN 22; Creatinine, Ser 1.43; Hemoglobin 13.8; Platelets 210.0; Potassium 5.4; Sodium 136; TSH 2.33   Lipid Panel Lipid Panel     Component Value Date/Time   CHOL 139 02/18/2022 1116   CHOL 113 12/05/2019 0748   TRIG 183.0 (H) 02/18/2022 1116   HDL 40.60 02/18/2022 1116   HDL 41 12/05/2019 0748    CHOLHDL 3 02/18/2022 1116   VLDL 36.6 02/18/2022 1116   LDLCALC 61 02/18/2022 1116   LDLCALC 51 12/05/2019 0748    Wt Readings from Last 3 Encounters:  06/18/22 90.6 kg  03/04/22 87.3 kg  02/18/22 86.2 kg    Assessment and Plan:   1. CAD with stable angina: No chest pain suggestive of angina. CAD stable by cardiac cath May 2021. Continue ASA, beta blocker, Imdur and statin.   2. HTN: BP is controlled. No changes  3. HLD: Lipids followed at the [place]. LDL ***. Continue statin  4. PVCs: No palpitations. Continue beta blocker  5. Carotid artery disease: Mild bilateral carotid artery disease by dopplers April 2023.  6. Mitral valve regurgitation: Mild by echo in May 2021.   Labs/ tests ordered today include:  No orders of the defined types were placed in this encounter.  Disposition:   F/U with me in 12  months  Signed, Verne Carrow, MD 07/13/2022 6:49 PM    [facility] [address] [place], [place], [place]  [address] Phone: [phone]; Fax: [phone]

## 2022-07-14 ENCOUNTER — Ambulatory Visit (INDEPENDENT_AMBULATORY_CARE_PROVIDER_SITE_OTHER): Payer: Medicare Other

## 2022-07-14 ENCOUNTER — Ambulatory Visit: Payer: Medicare Other | Attending: Cardiovascular Disease | Admitting: Cardiovascular Disease

## 2022-07-14 ENCOUNTER — Encounter: Payer: Self-pay | Admitting: Cardiovascular Disease

## 2022-07-14 VITALS — BP 100/62 | HR 76 | Ht 70.5 in | Wt 200.2 lb

## 2022-07-14 DIAGNOSIS — E7800 Pure hypercholesterolemia, unspecified: Secondary | ICD-10-CM

## 2022-07-14 DIAGNOSIS — I25118 Atherosclerotic heart disease of native coronary artery with other forms of angina pectoris: Secondary | ICD-10-CM

## 2022-07-14 DIAGNOSIS — I340 Nonrheumatic mitral (valve) insufficiency: Secondary | ICD-10-CM

## 2022-07-14 DIAGNOSIS — I493 Ventricular premature depolarization: Secondary | ICD-10-CM | POA: Diagnosis not present

## 2022-07-14 DIAGNOSIS — I779 Disorder of arteries and arterioles, unspecified: Secondary | ICD-10-CM

## 2022-07-14 DIAGNOSIS — I10 Essential (primary) hypertension: Secondary | ICD-10-CM | POA: Diagnosis not present

## 2022-07-14 NOTE — Patient Instructions (Signed)
Medication Instructions:  No changes *If you need a refill on your cardiac medications before your next appointment, please call your pharmacy*   Lab Work: none   Testing/Procedures: Zio Heart Patch - 3 days   Follow-Up: At [facility], you and your health needs are our priority.  As part of our continuing mission to provide you with exceptional heart care, we have created designated Provider Care Teams.  These Care Teams include your primary Cardiologist (physician) and Advanced Practice Providers (APPs -  Physician Assistants and Nurse Practitioners) who all work together to provide you with the care you need, when you need it.   Your next appointment:   12 month(s)  Provider:   Lauree Chandler, MD

## 2022-07-14 NOTE — Progress Notes (Unsigned)
ZIO XT serial # [id number] from office inventory applied to patient.

## 2022-07-25 DIAGNOSIS — I493 Ventricular premature depolarization: Secondary | ICD-10-CM | POA: Diagnosis not present

## 2022-07-28 ENCOUNTER — Other Ambulatory Visit: Payer: Self-pay | Admitting: *Deleted

## 2022-07-28 DIAGNOSIS — I493 Ventricular premature depolarization: Secondary | ICD-10-CM

## 2022-08-04 ENCOUNTER — Other Ambulatory Visit: Payer: Self-pay | Admitting: Internal Medicine

## 2022-08-04 DIAGNOSIS — G4700 Insomnia, unspecified: Secondary | ICD-10-CM

## 2022-08-19 ENCOUNTER — Telehealth: Payer: Self-pay

## 2022-08-19 ENCOUNTER — Ambulatory Visit: Payer: Self-pay

## 2022-08-19 NOTE — Patient Outreach (Signed)
  Care Coordination   08/19/2022 Name: Richard Ho MRN: [id number] DOB: [birth year]   Care Coordination Outreach Attempts:  An unsuccessful telephone outreach was attempted today to offer the patient information about available care coordination services as a benefit of their health plan.   Follow Up Plan:  Additional outreach attempts will be made to offer the patient care coordination information and services.   Encounter Outcome:  No Answer   Care Coordination Interventions:  No, not indicated    Daneen Schick, BSW, CDP Social Worker, Certified Dementia Practitioner [place] [facility]  Care Coordination [phone]

## 2022-08-19 NOTE — Patient Instructions (Signed)
Visit Information  Thank you for taking time to visit with me today. Please don't hesitate to contact me if I can be of assistance to you.   Following are the goals we discussed today:  -Work with Consulting civil engineer to address care management needs  If you are experiencing a [place] or [place] or need someone to talk to, please call 911  Patient verbalizes understanding of instructions and care plan provided today and agrees to view in [place]. Active MyChart status and patient understanding of how to access instructions and care plan via MyChart confirmed with patient.     Daneen Schick, BSW, CDP Social Worker, Certified Dementia Practitioner [place] [facility]  Care Coordination [phone]

## 2022-08-19 NOTE — Patient Outreach (Addendum)
  Care Coordination   Initial Visit Note   08/19/2022 Name: Richard Ho MRN: [id number] DOB: 12-Jul-1940  BRANSON KRANZ is a 82 y.o. year old male who sees Isaac Bliss, Rayford Halsted, MD for primary care. I spoke with  Tobey Grim by phone today. Introduced care coordination program to the patient. Discussed patient is experiencing difficulty with swallowing liquids. He has been seen by his provider at the [facility] and is awaiting a referral to speech therapy. Patient reports this therapy will occur within the [facility] system due to the closest [place] being far away from patients home. Patient is agreeable to speak with RN Care Manager.  What matters to the patients health and wellness today?  I need a referral to the speech therapist    SDOH assessments and interventions completed:  Yes  SDOH Interventions Today    Flowsheet Row Most Recent Value  SDOH Interventions   Food Insecurity Interventions Intervention Not Indicated  Transportation Interventions Intervention Not Indicated  Financial Strain Interventions Intervention Not Indicated        Care Coordination Interventions:  Yes, provided   Interventions Today    Flowsheet Row Most Recent Value  Chronic Disease   Chronic disease during today's visit Hypertension (HTN), Diabetes  General Interventions   General Interventions Discussed/Reviewed General Interventions Discussed, Referral to Nurse        Follow up plan: Referral made to RN Care Manager    Encounter Outcome:  Pt. Visit Completed   Daneen Schick, Arita Miss, CDP Social Worker, Certified Dementia Practitioner [place] Coordination [phone]

## 2022-08-26 ENCOUNTER — Telehealth: Payer: Self-pay

## 2022-08-26 NOTE — Patient Outreach (Signed)
  Care Coordination   08/26/2022 Name: PAPE YAHR MRN: [id number] DOB: [birth year]   Care Coordination Outreach Attempts:  An unsuccessful telephone outreach was attempted today to offer the patient information about available care coordination services as a benefit of their health plan.   Follow Up Plan:  Additional outreach attempts will be made to offer the patient care coordination information and services.   Encounter Outcome:  No Answer   Care Coordination Interventions:  No, not indicated     Jone Baseman, RN, MSN [place] Management Care Management Coordinator Direct Line [phone]

## 2022-09-01 ENCOUNTER — Telehealth: Payer: Self-pay

## 2022-09-01 NOTE — Patient Instructions (Signed)
Visit Information  Thank you for taking time to visit with me today. Please don't hesitate to contact me if I can be of assistance to you.   Following are the goals we discussed today:   Goals Addressed             This Visit's Progress    COMPLETED: Care Coordination Activities-No follow up required       Patient waitng for referral for Speech therapy from the [place].  CM contacted outpatient Speech therpay.  They have not received referral. Advised patient to contact the [place] about referral status.    Care Coordination Interventions: Discussed [facility] services and support. Advised to discuss with primary care physician if services needed in the future. Interventions Today    Flowsheet Row Most Recent Value  General Interventions   General Interventions Discussed/Reviewed Health Screening, General Interventions Discussed, Communication with  Communication with PCP/Specialists  [Outpatient rehab regarding ST referral]  Exercise Interventions   Exercise Discussed/Reviewed Exercise Discussed  Education Interventions   Education Provided Provided Education  Provided Verbal Education On Nutrition, Blood Sugar Monitoring  Mental Health Interventions   Mental Health Discussed/Reviewed Depression, Mental Health Discussed  Nutrition Interventions   Nutrition Discussed/Reviewed Nutrition Discussed, Decreasing sugar intake, Carbohydrate meal planning  Pharmacy Interventions   Pharmacy Dicussed/Reviewed Pharmacy Topics Discussed, Medications and their functions              If you are experiencing a Mental Health or [place] or need someone to talk to, please call the Suicide and Crisis Lifeline: [phone]   Patient verbalizes understanding of instructions and care plan provided today and agrees to view in [place]. Active MyChart status and patient understanding of how to access instructions and care plan via MyChart confirmed with patient.     The patient has been provided with  contact information for the care management team and has been advised to call with any health related questions or concerns.   Jone Baseman, RN, MSN [place] [facility] Care Management Coordinator Direct Line [phone]

## 2022-09-01 NOTE — Patient Outreach (Signed)
  Care Coordination   Initial Visit Note   09/01/2022 Name: Richard Ho MRN: [id number] DOB: 11/09/1940  Richard Ho is a 82 y.o. year old male who sees Isaac Bliss, Rayford Halsted, MD for primary care. I spoke with  Tobey Grim by phone today.  What matters to the patients health and wellness today?  Speech therapy referral from [place] to [facility]    Goals Addressed             This Visit's Progress    COMPLETED: Care Coordination Activities-No follow up required       Patient waitng for referral for Speech therapy from the [place].  CM contacted outpatient Speech therpay.  They have not received referral. Advised patient to contact the [place] about referral status.    Care Coordination Interventions: Discussed [facility] services and support. Advised to discuss with primary care physician if services needed in the future. Interventions Today    Flowsheet Row Most Recent Value  General Interventions   General Interventions Discussed/Reviewed Health Screening, General Interventions Discussed, Communication with  Communication with PCP/Specialists  [Outpatient rehab regarding ST referral]  Exercise Interventions   Exercise Discussed/Reviewed Exercise Discussed  Education Interventions   Education Provided Provided Education  Provided Verbal Education On Nutrition, Blood Sugar Monitoring  Mental Health Interventions   Mental Health Discussed/Reviewed Depression, Mental Health Discussed  Nutrition Interventions   Nutrition Discussed/Reviewed Nutrition Discussed, Decreasing sugar intake, Carbohydrate meal planning  Pharmacy Interventions   Pharmacy Dicussed/Reviewed Pharmacy Topics Discussed, Medications and their functions             SDOH assessments and interventions completed:  Yes  SDOH Interventions Today    Flowsheet Row Most Recent Value  SDOH Interventions   Housing Interventions Intervention Not Indicated        Care Coordination Interventions:  Yes, provided    Follow up plan: No further intervention required.   Encounter Outcome:  Pt. Visit Completed   Richard Baseman, RN, MSN [place] Management Care Management Coordinator Direct Line [phone]

## 2022-09-02 ENCOUNTER — Encounter: Payer: Self-pay | Admitting: Allergy and Immunology

## 2022-09-02 ENCOUNTER — Ambulatory Visit: Payer: Medicare Other | Admitting: Allergy and Immunology

## 2022-09-02 ENCOUNTER — Ambulatory Visit: Payer: Medicare Other | Attending: Cardiovascular Disease | Admitting: Cardiovascular Disease

## 2022-09-02 ENCOUNTER — Encounter: Payer: Self-pay | Admitting: Cardiovascular Disease

## 2022-09-02 VITALS — BP 132/76 | HR 83 | Ht 70.5 in | Wt 207.2 lb

## 2022-09-02 VITALS — BP 140/76 | HR 83 | Temp 98.3°F | Resp 16 | Ht 70.51 in | Wt 205.2 lb

## 2022-09-02 DIAGNOSIS — J301 Allergic rhinitis due to pollen: Secondary | ICD-10-CM | POA: Diagnosis not present

## 2022-09-02 DIAGNOSIS — I493 Ventricular premature depolarization: Secondary | ICD-10-CM

## 2022-09-02 DIAGNOSIS — K219 Gastro-esophageal reflux disease without esophagitis: Secondary | ICD-10-CM

## 2022-09-02 DIAGNOSIS — K224 Dyskinesia of esophagus: Secondary | ICD-10-CM

## 2022-09-02 DIAGNOSIS — J3089 Other allergic rhinitis: Secondary | ICD-10-CM | POA: Diagnosis not present

## 2022-09-02 DIAGNOSIS — J4489 Other specified chronic obstructive pulmonary disease: Secondary | ICD-10-CM

## 2022-09-02 MED ORDER — PANTOPRAZOLE SODIUM 40 MG PO TBEC: 40.0000 mg | DELAYED_RELEASE_TABLET | Freq: Two times a day (BID) | ORAL | 1 refills | Status: DC

## 2022-09-02 MED ORDER — FLUTICASONE PROPIONATE 50 MCG/ACT NA SUSP: NASAL | 1 refills | Status: DC

## 2022-09-02 MED ORDER — FLUTICASONE PROPIONATE HFA 110 MCG/ACT IN AERO: INHALATION_SPRAY | RESPIRATORY_TRACT | 3 refills | Status: AC

## 2022-09-02 MED ORDER — ALBUTEROL SULFATE HFA 108 (90 BASE) MCG/ACT IN AERS: 2.0000 | INHALATION_SPRAY | RESPIRATORY_TRACT | 1 refills | Status: DC | PRN

## 2022-09-02 MED ORDER — MAGNESIUM 400 MG PO CAPS: 400.0000 mg | ORAL_CAPSULE | Freq: Two times a day (BID) | ORAL | Status: AC

## 2022-09-02 MED ORDER — FEXOFENADINE HCL 180 MG PO TABS: 180.0000 mg | ORAL_TABLET | Freq: Every day | ORAL | 1 refills | Status: DC | PRN

## 2022-09-02 MED ORDER — BREZTRI AEROSPHERE 160-9-4.8 MCG/ACT IN AERO: INHALATION_SPRAY | RESPIRATORY_TRACT | 5 refills | Status: DC

## 2022-09-02 NOTE — Progress Notes (Signed)
Electrophysiology Office Note:    Date:  09/02/2022   ID:  Richard Ho, DOB 10/27/1940, MRN [id number]  PCP:  Isaac Bliss, Rayford Halsted, MD   [place] Providers Cardiologist:  Lauree Chandler, MD Electrophysiologist:  Melida Quitter, MD     Referring MD: Burnell Blanks*   History of Present Illness:    Richard Ho is a 82 y.o. male with a hx listed below, significant for CAD, HTN, HLD DM, referred for PVC management.  He frequently has a sense of "hollowness" in his chest.  He is says his heart sometimes "stings."  He is able to demonstrate a sensation of the irregularity of his heart rate by tapping on his lap.  He notices an occasional skipped beat.  He has not had syncope, presyncope, chest pain, shortness of breath.    Past Medical History:  Diagnosis Date   Anemia    Asthma    Cataract    CKD (chronic kidney disease), stage III ([place])    Coronary artery disease 1991   a) Angioplasty LAD 1991 [place] b) MI in 1995 at  [facility] in [place], med Rx c) Cath 10/2019 for NSTEMI - see report -> med rx.   Diabetes mellitus    GERD (gastroesophageal reflux disease)    Hearing loss    HTN (hypertension)    Hyperlipidemia    Hypothyroidism    Myocardial infarct (HCC)    Nephrolithiasis    Osteopenia    Premature atrial contractions    PVC's (premature ventricular contractions)    Snake bite     Past Surgical History:  Procedure Laterality Date   ANGIOPLASTY  1991   CARDIAC CATHETERIZATION  2005   Moderate LCx and LAD disease and severe diffuse RCA disease   CARDIAC CATHETERIZATION  01/2012   normal left main, 50% pLAD stenosis, dLAD mild luminal irregularities, D1 30-40% stenosis at take off; subtotally occluded/severely diseased OM1, mid 80% stenosis in small OM2, long prox and mid RCA stenoses ranging from 60-95%, severely diseased PDA, distal PLSA occluded filling via L-R collateral; normal LV function; inferior lateral basal  akinesis.   CHOLECYSTECTOMY  2012   EYE SURGERY  2010   cataract - right, has left done too   INGUINAL HERNIA REPAIR     INTRAVASCULAR PRESSURE WIRE/FFR STUDY N/A 10/10/2019   Procedure: INTRAVASCULAR PRESSURE WIRE/FFR STUDY;  Surgeon: Lorretta Harp, MD;  Location: [place] CV LAB;  Service: Cardiovascular;  Laterality: N/A;   LEFT HEART CATH AND CORONARY ANGIOGRAPHY N/A 10/10/2019   Procedure: LEFT HEART CATH AND CORONARY ANGIOGRAPHY;  Surgeon: Lorretta Harp, MD;  Location: [place] CV LAB;  Service: Cardiovascular;  Laterality: N/A;   LEFT HEART CATHETERIZATION WITH CORONARY ANGIOGRAM N/A 01/28/2012   Procedure: LEFT HEART CATHETERIZATION WITH CORONARY ANGIOGRAM;  Surgeon: Wellington [place], MD;  Location: [place] CATH LAB;  Service: Cardiovascular;  Laterality: N/A;   LEFT HEART CATHETERIZATION WITH CORONARY ANGIOGRAM N/A 03/17/2014   Procedure: LEFT HEART CATHETERIZATION WITH CORONARY ANGIOGRAM;  Surgeon: Burnell Blanks, MD;  Location: [facility] CATH LAB;  Service: Cardiovascular;  Laterality: N/A;   ORIF TIBIA & FIBULA FRACTURES     PTCA     SHOULDER ARTHROSCOPY WITH DISTAL CLAVICLE RESECTION Left 12/16/2018   Procedure: LEFT SHOULDER ARTHROSCOPY WITH DISTAL CLAVICLE RESECTION;  Surgeon: Hiram Gash, MD;  Location: [place];  Service: Orthopedics;  Laterality: Left;   SHOULDER ARTHROSCOPY WITH ROTATOR CUFF REPAIR AND SUBACROMIAL DECOMPRESSION  Left 12/16/2018   Procedure: LEFT SHOULDER ARTHROSCOPY WITH ROTATOR CUFF REPAIR AND SUBACROMIAL DECOMPRESSION, DEBRIDEMENT;  Surgeon: Hiram Gash, MD;  Location: [place];  Service: Orthopedics;  Laterality: Left;   SHOULDER ARTHROSCOPY WITH SUBACROMIAL DECOMPRESSION AND BICEP TENDON REPAIR Left 12/16/2018   Procedure: SHOULDER ARTHROSCOPY WITH SUBACROMIAL DECOMPRESSION AND BICEP TENDON REPAIR;  Surgeon: Hiram Gash, MD;  Location: [place];  Service: Orthopedics;  Laterality: Left;   VASECTOMY       Current Medications: Current Meds  Medication Sig   Albuterol (PROVENTIL IN) Inhale into the lungs.   albuterol (VENTOLIN HFA) 108 (90 Base) MCG/ACT inhaler Inhale 2 puffs into the lungs every 4 (four) hours as needed for wheezing or shortness of breath.   ALPRAZolam (XANAX) 0.25 MG tablet TAKE 1 TABLET BY MOUTH AT BEDTIME AS NEEDED FOR SLEEP   aspirin EC 81 MG tablet Take 81 mg by mouth daily.   atorvastatin (LIPITOR) 40 MG tablet Take 1 tablet (40 mg total) by mouth daily.   azelastine (ASTELIN) 0.1 % nasal spray Place 2 sprays into both nostrils 2 (two) times daily.   Budeson-Glycopyrrol-Formoterol (BREZTRI AEROSPHERE) 160-9-4.8 MCG/ACT AERO 2 inhalations 1-2 times per day with spacer   Capsaicin 0.1 % CREA APPLY SMALL AMOUNT TO AFFECTED AREA DAILY AS NEEDED   carvedilol (COREG) 6.25 MG tablet Take 1 tablet (6.25 mg total) by mouth 2 (two) times daily with a meal.   Cholecalciferol (VITAMIN D3 PO) Take 1 tablet by mouth daily.   empagliflozin (JARDIANCE) 25 MG TABS tablet Take 0.5 tablets by mouth every morning. Pt take 12.5 mg once  a day.   ferrous sulfate 325 (65 FE) MG EC tablet Take 325 mg by mouth daily.    fexofenadine (ALLEGRA) 180 MG tablet Take 1 tablet (180 mg total) by mouth daily as needed for allergies or rhinitis (Can take an extra dose during flare ups.).   fluticasone (FLONASE) 50 MCG/ACT nasal spray USE 1 TO 2 SPRAY(S) IN EACH NOSTRIL ONCE DAILY AS DIRECTED   fluticasone (FLOVENT HFA) 110 MCG/ACT inhaler During asthma flares do 3 inhalations 3 times a day   isosorbide mononitrate (IMDUR) 30 MG 24 hr tablet TAKE ONE TABLET BY MOUTH TWICE DAILY   levothyroxine (SYNTHROID) 100 MCG tablet Take 1 tablet by mouth once daily   nitroGLYCERIN (NITROSTAT) 0.4 MG SL tablet Place 1 tablet (0.4 mg total) under the tongue every 5 (five) minutes x 3 doses as needed for chest pain.   olopatadine (PATANOL) 0.1 % ophthalmic solution Place 2 drops into both eyes 2 (two) times daily.    ondansetron (ZOFRAN) 8 MG tablet Take 8 mg by mouth every 8 (eight) hours as needed for nausea or vomiting.    pantoprazole (PROTONIX) 40 MG tablet Take 1 tablet (40 mg total) by mouth 2 (two) times daily.   pregabalin (LYRICA) 75 MG capsule Take 1 capsule by mouth twice daily   Spacer/Aero-Holding Chambers DEVI 1 Device by Does not apply route as directed.   tamsulosin (FLOMAX) 0.4 MG CAPS capsule TAKE ONE CAPSULE BY MOUTH DAILY     Allergies:   Meloxicam   Social and Family History: Reviewed in Epic  ROS:   Please see the history of present illness.    All other systems reviewed and are negative.  EKGs/Labs/Other Studies Reviewed Today:    Echocardiogram:  ordered   Monitors:  Zio Patch 07/17/2022 SR 48-107 bpm, avg  9.9% PVC burden - multiple morphologies  Stress  testing:    Advanced imaging:    EKG:  Last EKG results: today - sinus rhythm with occasional PVC couplets. 2 morphologies   Recent Labs: 02/18/2022: ALT 17; BUN 22; Creatinine, Ser 1.43; Hemoglobin 13.8; Platelets 210.0; Potassium 5.4; Sodium 136; TSH 2.33     Physical Exam:    VS:  BP 132/76   Pulse 83   Ht 5' 10.5" (1.791 m)   Wt 207 lb 3.2 oz (94 kg)   SpO2 95%   BMI 29.31 kg/m     Wt Readings from Last 3 Encounters:  09/02/22 207 lb 3.2 oz (94 kg)  09/02/22 205 lb 3.2 oz (93.1 kg)  07/14/22 200 lb 3.2 oz (90.8 kg)     GEN: Well nourished, well developed in no acute distress CARDIAC: RRR, no murmurs, rubs, gallops RESPIRATORY:  Normal work of breathing MUSCULOSKELETAL: no edema    ASSESSMENT & PLAN:    PVCs Two morphologies Burden is about 10% He is aware of PVCs but minimally symptomatic.  Ablation would likely be difficult given multiple morphologies Will repeat limited TTE for EF. Continue carvedilol 6.25 mg Try magnesium taurate  CAD with stable angina Continue atorvastatin 40 mg, carvedilol 6.25 mg, aspirin 81 mg   We will schedule follow-up if his EF is subnormal to  readdress the need for PVC ablation. As-is, I do not see a strong need for EP intervention given that his burden is about 10%, and he is not particularly bothered by the PVCs.       Medication Adjustments/Labs and Tests Ordered: Current medicines are reviewed at length with the patient today.  Concerns regarding medicines are outlined above.  No orders of the defined types were placed in this encounter.  No orders of the defined types were placed in this encounter.    Signed, Melida Quitter, MD  09/02/2022 4:33 PM    [place]

## 2022-09-02 NOTE — Patient Instructions (Addendum)
Medication Instructions:  Your physician has recommended you make the following change in your medication:  1) START taking magnesium taurate 400 mg twice daily (over-the-counter)  *If you need a refill on your cardiac medications before your next appointment, please call your pharmacy*   Testing/Procedures: Your physician has requested that you have an echocardiogram. Echocardiography is a painless test that uses sound waves to create images of your heart. It provides your doctor with information about the size and shape of your heart and how well your heart's chambers and valves are working. This procedure takes approximately one hour. There are no restrictions for this procedure. Please do NOT wear cologne, perfume, aftershave, or lotions (deodorant is allowed). Please arrive 15 minutes prior to your appointment time.   Follow-Up: At [facility], you and your health needs are our priority.  As part of our continuing mission to provide you with exceptional heart care, we have created designated Provider Care Teams.  These Care Teams include your primary Cardiologist (physician) and Advanced Practice Providers (APPs -  Physician Assistants and Nurse Practitioners) who all work together to provide you with the care you need, when you need it.    Your next appointment:   As needed  Provider:   You may see Melida Quitter, MD or one of the following Advanced Practice Providers on your designated Care Team:   Tommye Standard, [place] [address] [place], [place], NP

## 2022-09-02 NOTE — Patient Instructions (Signed)
  1. Continue to Perform Allergen avoidance measures - dust mite, pollens, molds  2. Continue to Treat and prevent inflammation:   A. Breztri - 2 inhalations 2 times per day with spacer  B. Flonase - 1 spray each nostril 2 times per day  C. Azelastine - 1-2 sprays each nostril 2 times per day  3. Continue treatment for reflux:  A. Protonix 40 mg 1-2 times a day   4. If needed:   A. Albuterol HFA 2 puffs every 4-6 hours  B. OTC antihistamine - Allegra  C. Pataday - 1 drop each eye 1 time per day  5. "Action plan" for asthma flare up:   A. continue Breztri  B. add Fluticasone 110 - 3 inhalations 3 times a day with spacer  C. use Proventil or Ventolin HFA if needed  6. Return to clinic in 6 months or earlier if problem

## 2022-09-02 NOTE — Progress Notes (Signed)
[place]   Follow-up Note  Referring Provider: Isaac Bliss, [place]* Primary Provider: Isaac Bliss, Rayford Halsted, MD Date of Office Visit: 09/02/2022  Subjective:   Richard Ho (DOB: 1940/07/02) is a 82 y.o. male who returns to the [facility] on 09/02/2022 in re-evaluation of the following:  HPI: Bren returns to this clinic in evaluation of COPD/asthma overlap, allergic rhinoconjunctivitis, reflux, untreated sleep apnea.  I last saw him in this clinic 04 March 2022.  His lower airway overlap issue is under very good control at this point in time while he continues to use a triple inhaler twice a day on a consistent basis and while doing so he has not required a systemic steroid to treat an exacerbation and he rarely uses a short acting bronchodilator and he can exert himself.  Since the spring has arrived he has had a little bit more problems with runny nose and some congestion of his nose and itchy eyes.  He has had his nasal ipratropium changed to azelastine which he thinks does work a little bit better.  He consistently uses his Flonase.  It does not sound as though he is required an antibiotic to treat an episode of sinusitis.  Although his classic reflux symptoms are under good control he is having problems with swallowing.  It sounds as though he occasionally gets an obstruction to swallowing liquids over the course of the past 2 months.  This occurs while using pantoprazole 1 time per day.  He cannot remember the last time he had an upper endoscopy.  He is scheduled to have a swallow test.  He has received the flu vaccine, RSV vaccine, and COVID-vaccine.  Allergies as of 09/02/2022       Reactions   Meloxicam Other (See Comments), Anxiety   "jumping out of my skin" Pt feels anxiety attacks when on this medicine        Medication List    albuterol 108 (90 Base) MCG/ACT inhaler Commonly known  as: VENTOLIN HFA Inhale 2 puffs into the lungs every 4 (four) hours as needed for wheezing or shortness of breath.   ALPRAZolam 0.25 MG tablet Commonly known as: XANAX TAKE 1 TABLET BY MOUTH AT BEDTIME AS NEEDED FOR SLEEP   aspirin EC 81 MG tablet Take 81 mg by mouth daily.   atorvastatin 40 MG tablet Commonly known as: LIPITOR Take 1 tablet (40 mg total) by mouth daily.   Breztri Aerosphere 160-9-4.8 MCG/ACT Aero Generic drug: Budeson-Glycopyrrol-Formoterol 2 inhalations 1-2 times per day with spacer   Capsaicin 0.1 % Crea APPLY SMALL AMOUNT TO AFFECTED AREA DAILY AS NEEDED   carvedilol 6.25 MG tablet Commonly known as: COREG Take 1 tablet (6.25 mg total) by mouth 2 (two) times daily with a meal.   empagliflozin 25 MG Tabs tablet Commonly known as: JARDIANCE Take 0.5 tablets by mouth every morning. Pt take 12.5 mg once  a day.   ferrous sulfate 325 (65 FE) MG EC tablet Take 325 mg by mouth daily.   fexofenadine 180 MG tablet Commonly known as: ALLEGRA Take 1 tablet (180 mg total) by mouth daily as needed for allergies or rhinitis (Can take an extra dose during flare ups.).   fluticasone 110 MCG/ACT inhaler Commonly known as: Flovent HFA Inhale 3 puffs into the lungs in the morning, at noon, and at bedtime. During flare ups   fluticasone 50 MCG/ACT nasal spray Commonly known as: FLONASE USE  1 TO 2 SPRAY(S) IN EACH NOSTRIL ONCE DAILY AS DIRECTED   ipratropium 0.06 % nasal spray Commonly known as: ATROVENT 2 sprays each nostril every 6 hours.   isosorbide mononitrate 30 MG 24 hr tablet Commonly known as: IMDUR TAKE ONE TABLET BY MOUTH TWICE DAILY   levothyroxine 100 MCG tablet Commonly known as: SYNTHROID Take 1 tablet by mouth once daily   nitroGLYCERIN 0.4 MG SL tablet Commonly known as: NITROSTAT Place 1 tablet (0.4 mg total) under the tongue every 5 (five) minutes x 3 doses as needed for chest pain.   olopatadine 0.1 % ophthalmic solution Commonly known  as: PATANOL Place 2 drops into both eyes 2 (two) times daily.   ondansetron 8 MG tablet Commonly known as: ZOFRAN Take 8 mg by mouth every 8 (eight) hours as needed for nausea or vomiting.   pantoprazole 40 MG tablet Commonly known as: PROTONIX Take 1 tablet (40 mg total) by mouth 2 (two) times daily.   pregabalin 75 MG capsule Commonly known as: LYRICA Take 1 capsule by mouth twice daily   PROVENTIL IN Inhale into the lungs.   Spacer/Aero-Holding [employer] 1 Device by Does not apply route as directed.   tamsulosin 0.4 MG Caps capsule Commonly known as: FLOMAX TAKE ONE CAPSULE BY MOUTH DAILY   VITAMIN D3 PO Take 1 tablet by mouth daily.    Past Medical History:  Diagnosis Date  . Anemia   . Asthma   . Cataract   . CKD (chronic kidney disease), stage III ([place])   . Coronary artery disease 1991   a) Angioplasty LAD 1991 [place] b) MI in 1995 at  [facility] in [place], med Rx c) Cath 10/2019 for NSTEMI - see report -> med rx.  . Diabetes mellitus   . GERD (gastroesophageal reflux disease)   . Hearing loss   . HTN (hypertension)   . Hyperlipidemia   . Hypothyroidism   . Myocardial infarct ([place])   . Nephrolithiasis   . Osteopenia   . Premature atrial contractions   . PVC's (premature ventricular contractions)   . Snake bite     Past Surgical History:  Procedure Laterality Date  . ANGIOPLASTY  1991  . CARDIAC CATHETERIZATION  2005   Moderate LCx and LAD disease and severe diffuse RCA disease  . CARDIAC CATHETERIZATION  01/2012   normal left main, 50% pLAD stenosis, dLAD mild luminal irregularities, D1 30-40% stenosis at take off; subtotally occluded/severely diseased OM1, mid 80% stenosis in small OM2, long prox and mid RCA stenoses ranging from 60-95%, severely diseased PDA, distal PLSA occluded filling via L-R collateral; normal LV function; inferior lateral basal akinesis.  . CHOLECYSTECTOMY  2012  . EYE SURGERY  2010   cataract - right, has left  done too  . INGUINAL HERNIA REPAIR    . INTRAVASCULAR PRESSURE WIRE/FFR STUDY N/A 10/10/2019   Procedure: INTRAVASCULAR PRESSURE WIRE/FFR STUDY;  Surgeon: Lorretta Harp, MD;  Location: [place] CV LAB;  Service: Cardiovascular;  Laterality: N/A;  . LEFT HEART CATH AND CORONARY ANGIOGRAPHY N/A 10/10/2019   Procedure: LEFT HEART CATH AND CORONARY ANGIOGRAPHY;  Surgeon: Lorretta Harp, MD;  Location: [place] CV LAB;  Service: Cardiovascular;  Laterality: N/A;  . LEFT HEART CATHETERIZATION WITH CORONARY ANGIOGRAM N/A 01/28/2012   Procedure: LEFT HEART CATHETERIZATION WITH CORONARY ANGIOGRAM;  Surgeon: Wellington [place], MD;  Location: [place] CATH LAB;  Service: Cardiovascular;  Laterality: N/A;  . LEFT HEART CATHETERIZATION WITH CORONARY ANGIOGRAM N/A  03/17/2014   Procedure: LEFT HEART CATHETERIZATION WITH CORONARY ANGIOGRAM;  Surgeon: Burnell Blanks, MD;  Location: [facility] CATH LAB;  Service: Cardiovascular;  Laterality: N/A;  . ORIF TIBIA & FIBULA FRACTURES    . PTCA    . SHOULDER ARTHROSCOPY WITH DISTAL CLAVICLE RESECTION Left 12/16/2018   Procedure: LEFT SHOULDER ARTHROSCOPY WITH DISTAL CLAVICLE RESECTION;  Surgeon: Hiram Gash, MD;  Location: [place];  Service: Orthopedics;  Laterality: Left;  . SHOULDER ARTHROSCOPY WITH ROTATOR CUFF REPAIR AND SUBACROMIAL DECOMPRESSION Left 12/16/2018   Procedure: LEFT SHOULDER ARTHROSCOPY WITH ROTATOR CUFF REPAIR AND SUBACROMIAL DECOMPRESSION, DEBRIDEMENT;  Surgeon: Hiram Gash, MD;  Location: [place];  Service: Orthopedics;  Laterality: Left;  . SHOULDER ARTHROSCOPY WITH SUBACROMIAL DECOMPRESSION AND BICEP TENDON REPAIR Left 12/16/2018   Procedure: SHOULDER ARTHROSCOPY WITH SUBACROMIAL DECOMPRESSION AND BICEP TENDON REPAIR;  Surgeon: Hiram Gash, MD;  Location: [place];  Service: Orthopedics;  Laterality: Left;  [place] VASECTOMY      Review of systems negative except as noted in HPI / PMHx or noted  below:  Review of Systems  Constitutional: Negative.   HENT: Negative.    Eyes: Negative.   Respiratory: Negative.    Cardiovascular: Negative.   Gastrointestinal: Negative.   Genitourinary: Negative.   Musculoskeletal: Negative.   Skin: Negative.   Neurological: Negative.   Endo/Heme/Allergies: Negative.   Psychiatric/Behavioral: Negative.       Objective:   Vitals:   09/02/22 0853  BP: (!) 140/76  Pulse: 83  Resp: 16  Temp: 98.3 F (36.8 C)  SpO2: 95%   Height: 5' 10.51" (179.1 cm)  Weight: 205 lb 3.2 oz (93.1 kg)   Physical Exam Constitutional:      Appearance: He is not diaphoretic.  HENT:     Head: Normocephalic.     Right Ear: Tympanic membrane, ear canal and external ear normal.     Left Ear: Tympanic membrane, ear canal and external ear normal.     Nose: Nose normal. No mucosal edema or rhinorrhea.     Mouth/Throat:     Pharynx: Uvula midline. No oropharyngeal exudate.  Eyes:     Conjunctiva/sclera: Conjunctivae normal.  Neck:     Thyroid: No thyromegaly.     Trachea: Trachea normal. No tracheal tenderness or tracheal deviation.  Cardiovascular:     Rate and Rhythm: Normal rate and regular rhythm.     Heart sounds: Normal heart sounds, S1 normal and S2 normal. No murmur heard. Pulmonary:     Effort: No respiratory distress.     Breath sounds: Normal breath sounds. No stridor. No wheezing or rales.  Lymphadenopathy:     Head:     Right side of head: No tonsillar adenopathy.     Left side of head: No tonsillar adenopathy.     Cervical: No cervical adenopathy.  Skin:    Findings: No erythema or rash.     Nails: There is no clubbing.  Neurological:     Mental Status: He is alert.    Diagnostics:    Spirometry was performed and demonstrated an FEV1 of 1.98 at 70 % of predicted.  Assessment and Plan:   1. COPD with asthma   2. Perennial allergic rhinitis   3. Seasonal allergic rhinitis due to pollen   4. Gastroesophageal reflux disease,  unspecified whether esophagitis present   5. Esophageal dysmotility    1. Continue to Perform Allergen avoidance measures - dust mite, pollens, molds  2. Continue to Treat and prevent inflammation:   A. Breztri - 2 inhalations 2 times per day with spacer  B. Flonase - 1 spray each nostril 2 times per day  C. Azelastine - 1-2 sprays each nostril 2 times per day  3. Continue treatment for reflux:  A. Protonix 40 mg 1-2 times a day   4. If needed:   A. Albuterol HFA 2 puffs every 4-6 hours  B. OTC antihistamine - Allegra  C. Pataday - 1 drop each eye 1 time per day  5. "Action plan" for asthma flare up:   A. continue Breztri  B. add Fluticasone - 110 3 inhalations 3 times a day with spacer  C. use Proventil or Ventolin HFA if needed  6. Return to clinic in 6 months or earlier if problem  T[place] appears to be doing pretty well regarding his airway issue although he is having a little bit more problems with his nose and his eyes as he is going through the spring pollination season.  He will remain on anti-inflammatory agents for his airway as noted above.  Hopefully with this upcoming rain that will occur this week most of the pollen level will go down to 0 and he will get some relief.  His reflux appears to be okay but certainly he has a component of esophageal obstruction and he is going to work through that issue by obtaining a swallow study and most likely an upper endoscopy at some point.  I will see him back in this clinic in 6 months or earlier if there is a problem.   Allena Katz, MD Allergy / Immunology [place]

## 2022-09-03 ENCOUNTER — Encounter: Payer: Self-pay | Admitting: Allergy and Immunology

## 2022-09-17 ENCOUNTER — Other Ambulatory Visit: Payer: Self-pay | Admitting: Internal Medicine

## 2022-09-17 DIAGNOSIS — G4700 Insomnia, unspecified: Secondary | ICD-10-CM

## 2022-09-25 ENCOUNTER — Other Ambulatory Visit (HOSPITAL_COMMUNITY): Payer: Self-pay

## 2022-09-25 DIAGNOSIS — R1310 Dysphagia, unspecified: Secondary | ICD-10-CM

## 2022-10-06 ENCOUNTER — Ambulatory Visit (HOSPITAL_COMMUNITY): Payer: Medicare Other | Attending: Cardiovascular Disease

## 2022-10-06 DIAGNOSIS — I493 Ventricular premature depolarization: Secondary | ICD-10-CM | POA: Diagnosis not present

## 2022-10-06 DIAGNOSIS — I340 Nonrheumatic mitral (valve) insufficiency: Secondary | ICD-10-CM | POA: Insufficient documentation

## 2022-10-06 DIAGNOSIS — I252 Old myocardial infarction: Secondary | ICD-10-CM | POA: Insufficient documentation

## 2022-10-06 DIAGNOSIS — I10 Essential (primary) hypertension: Secondary | ICD-10-CM | POA: Diagnosis not present

## 2022-10-06 DIAGNOSIS — E785 Hyperlipidemia, unspecified: Secondary | ICD-10-CM | POA: Insufficient documentation

## 2022-10-06 DIAGNOSIS — E119 Type 2 diabetes mellitus without complications: Secondary | ICD-10-CM | POA: Insufficient documentation

## 2022-10-06 DIAGNOSIS — I2510 Atherosclerotic heart disease of native coronary artery without angina pectoris: Secondary | ICD-10-CM | POA: Insufficient documentation

## 2022-10-06 LAB — ECHOCARDIOGRAM LIMITED
Area-P 1/2: 2.4 cm2
S' Lateral: 4.3 cm

## 2022-10-08 ENCOUNTER — Ambulatory Visit (HOSPITAL_COMMUNITY)
Admission: RE | Admit: 2022-10-08 | Discharge: 2022-10-08 | Disposition: A | Discharge status: Home / Self Care | Payer: No Typology Code available for payment source | Source: Ambulatory Visit | Attending: Internal Medicine | Admitting: Internal Medicine

## 2022-10-08 DIAGNOSIS — R1313 Dysphagia, pharyngeal phase: Secondary | ICD-10-CM | POA: Diagnosis not present

## 2022-10-08 DIAGNOSIS — R1310 Dysphagia, unspecified: Secondary | ICD-10-CM | POA: Diagnosis not present

## 2022-10-08 DIAGNOSIS — K2289 Other specified disease of esophagus: Secondary | ICD-10-CM | POA: Insufficient documentation

## 2022-10-13 ENCOUNTER — Other Ambulatory Visit: Payer: Self-pay | Admitting: Internal Medicine

## 2022-10-13 DIAGNOSIS — E1142 Type 2 diabetes mellitus with diabetic polyneuropathy: Secondary | ICD-10-CM

## 2022-10-19 ENCOUNTER — Other Ambulatory Visit: Payer: Self-pay | Admitting: Internal Medicine

## 2022-10-19 DIAGNOSIS — G4700 Insomnia, unspecified: Secondary | ICD-10-CM

## 2022-10-23 DIAGNOSIS — H524 Presbyopia: Secondary | ICD-10-CM | POA: Diagnosis not present

## 2022-10-31 DIAGNOSIS — G4700 Insomnia, unspecified: Secondary | ICD-10-CM | POA: Diagnosis not present

## 2022-10-31 DIAGNOSIS — I110 Hypertensive heart disease with heart failure: Secondary | ICD-10-CM | POA: Diagnosis not present

## 2022-10-31 DIAGNOSIS — Z9849 Cataract extraction status, unspecified eye: Secondary | ICD-10-CM | POA: Diagnosis not present

## 2022-11-26 ENCOUNTER — Other Ambulatory Visit: Payer: Self-pay | Admitting: Internal Medicine

## 2022-11-26 DIAGNOSIS — G4700 Insomnia, unspecified: Secondary | ICD-10-CM

## 2022-12-31 ENCOUNTER — Other Ambulatory Visit: Payer: Self-pay | Admitting: Internal Medicine

## 2022-12-31 DIAGNOSIS — G4700 Insomnia, unspecified: Secondary | ICD-10-CM

## 2023-01-14 ENCOUNTER — Other Ambulatory Visit: Payer: Self-pay | Admitting: Internal Medicine

## 2023-01-14 DIAGNOSIS — E1142 Type 2 diabetes mellitus with diabetic polyneuropathy: Secondary | ICD-10-CM

## 2023-01-22 ENCOUNTER — Encounter (INDEPENDENT_AMBULATORY_CARE_PROVIDER_SITE_OTHER): Payer: Self-pay

## 2023-01-29 DIAGNOSIS — N401 Enlarged prostate with lower urinary tract symptoms: Secondary | ICD-10-CM | POA: Diagnosis not present

## 2023-01-29 DIAGNOSIS — R3915 Urgency of urination: Secondary | ICD-10-CM | POA: Diagnosis not present

## 2023-02-08 DIAGNOSIS — E119 Type 2 diabetes mellitus without complications: Secondary | ICD-10-CM | POA: Diagnosis not present

## 2023-02-09 ENCOUNTER — Encounter: Payer: Self-pay | Admitting: Internal Medicine

## 2023-02-12 ENCOUNTER — Other Ambulatory Visit: Payer: Self-pay | Admitting: Internal Medicine

## 2023-02-12 DIAGNOSIS — G4700 Insomnia, unspecified: Secondary | ICD-10-CM

## 2023-02-23 ENCOUNTER — Ambulatory Visit (INDEPENDENT_AMBULATORY_CARE_PROVIDER_SITE_OTHER): Payer: Medicare Other | Admitting: Internal Medicine

## 2023-02-23 ENCOUNTER — Encounter: Payer: Self-pay | Admitting: Internal Medicine

## 2023-02-23 VITALS — BP 110/68 | HR 70 | Temp 97.6°F | Ht 71.0 in | Wt 205.6 lb

## 2023-02-23 DIAGNOSIS — I10 Essential (primary) hypertension: Secondary | ICD-10-CM | POA: Diagnosis not present

## 2023-02-23 DIAGNOSIS — E039 Hypothyroidism, unspecified: Secondary | ICD-10-CM

## 2023-02-23 DIAGNOSIS — Z23 Encounter for immunization: Secondary | ICD-10-CM | POA: Diagnosis not present

## 2023-02-23 DIAGNOSIS — Z125 Encounter for screening for malignant neoplasm of prostate: Secondary | ICD-10-CM | POA: Diagnosis not present

## 2023-02-23 DIAGNOSIS — I2510 Atherosclerotic heart disease of native coronary artery without angina pectoris: Secondary | ICD-10-CM

## 2023-02-23 DIAGNOSIS — E785 Hyperlipidemia, unspecified: Secondary | ICD-10-CM

## 2023-02-23 DIAGNOSIS — I252 Old myocardial infarction: Secondary | ICD-10-CM

## 2023-02-23 DIAGNOSIS — Z0000 Encounter for general adult medical examination without abnormal findings: Secondary | ICD-10-CM | POA: Diagnosis not present

## 2023-02-23 DIAGNOSIS — E1121 Type 2 diabetes mellitus with diabetic nephropathy: Secondary | ICD-10-CM

## 2023-02-23 DIAGNOSIS — J4520 Mild intermittent asthma, uncomplicated: Secondary | ICD-10-CM

## 2023-02-23 LAB — LIPID PANEL
Cholesterol: 124 mg/dL (ref 0–200)
HDL: 35.8 mg/dL — ABNORMAL LOW (ref >39.00)
LDL Cholesterol: 59 mg/dL (ref 0–99)
NonHDL: 88.34
Total CHOL/HDL Ratio: 3
Triglycerides: 145 mg/dL (ref 0.0–149.0)
VLDL: 29 mg/dL (ref 0.0–40.0)

## 2023-02-23 LAB — COMPREHENSIVE METABOLIC PANEL
ALT: 17 U/L (ref 0–53)
AST: 16 U/L (ref 0–37)
Albumin: 3.9 g/dL (ref 3.5–5.2)
Alkaline Phosphatase: 94 U/L (ref 39–117)
BUN: 25 mg/dL — ABNORMAL HIGH (ref 6–23)
CO2: 26 mEq/L (ref 19–32)
Calcium: 9.5 mg/dL (ref 8.4–10.5)
Chloride: 103 meq/L (ref 96–112)
Creatinine, Ser: 1.47 mg/dL (ref 0.40–1.50)
GFR: 44.28 mL/min — ABNORMAL LOW (ref >60.00)
Glucose, Bld: 108 mg/dL — ABNORMAL HIGH (ref 70–99)
Potassium: 4.8 meq/L (ref 3.5–5.1)
Sodium: 138 meq/L (ref 135–145)
Total Bilirubin: 0.5 mg/dL (ref 0.2–1.2)
Total Protein: 6 g/dL (ref 6.0–8.3)

## 2023-02-23 LAB — CBC WITH DIFFERENTIAL/PLATELET
Basophils Absolute: 0.1 10*3/uL (ref 0.0–0.1)
Basophils Relative: 0.8 % (ref 0.0–3.0)
Eosinophils Absolute: 0.2 10*3/uL (ref 0.0–0.7)
Eosinophils Relative: 3.7 % (ref 0.0–5.0)
HCT: 40.2 % (ref 39.0–52.0)
Hemoglobin: 13.2 g/dL (ref 13.0–17.0)
Lymphocytes Relative: 28.4 % (ref 12.0–46.0)
Lymphs Abs: 1.8 10*3/uL (ref 0.7–4.0)
MCHC: 32.8 g/dL (ref 30.0–36.0)
MCV: 103.6 fl — ABNORMAL HIGH (ref 78.0–100.0)
Monocytes Absolute: 0.6 10*3/uL (ref 0.1–1.0)
Monocytes Relative: 8.8 % (ref 3.0–12.0)
Neutro Abs: 3.7 10*3/uL (ref 1.4–7.7)
Neutrophils Relative %: 58.3 % (ref 43.0–77.0)
Platelets: 218 10*3/uL (ref 150.0–400.0)
RBC: 3.88 Mil/uL — ABNORMAL LOW (ref 4.22–5.81)
RDW: 13.3 % (ref 11.5–15.5)
WBC: 6.3 10*3/uL (ref 4.0–10.5)

## 2023-02-23 LAB — MICROALBUMIN / CREATININE URINE RATIO
Creatinine,U: 63.6 mg/dL
Microalb Creat Ratio: 1.2 mg/g (ref 0.0–30.0)
Microalb, Ur: 0.7 mg/dL (ref 0.0–1.9)

## 2023-02-23 LAB — TSH: TSH: 1.83 u[IU]/mL (ref 0.35–5.50)

## 2023-02-23 LAB — HEMOGLOBIN A1C: Hgb A1c MFr Bld: 7.1 % — ABNORMAL HIGH (ref 4.6–6.5)

## 2023-02-23 LAB — PSA: PSA: 2.66 ng/mL (ref 0.10–4.00)

## 2023-02-23 NOTE — Progress Notes (Signed)
Medicare Wellness question were answered 02/20/23.

## 2023-02-23 NOTE — Progress Notes (Signed)
Established Patient Office Visit     CC/Reason for Visit: Annual preventive exam and subsequent Medicare wellness visit  HPI: Richard Ho is a 82 y.o. male who is coming in today for the above mentioned reasons. Past Medical History is significant for: Type 2 diabetes with peripheral neuropathy, hypertension, hypothyroidism, asthma, coronary artery disease.  He has most of his care through the [place].  Has routine eye and dental care.  Is requesting a flu vaccine.  Is due for COVID and RSV vaccines.  No acute concerns or complaints.   Past Medical/Surgical History: Past Medical History:  Diagnosis Date   Anemia    Asthma    Cataract    CKD (chronic kidney disease), stage III (HCC)    Coronary artery disease 1991   a) Angioplasty LAD 1991 [place] b) MI in 1995 at  [facility] in [place], med Rx c) Cath 10/2019 for NSTEMI - see report -> med rx.   Diabetes mellitus    GERD (gastroesophageal reflux disease)    Hearing loss    HTN (hypertension)    Hyperlipidemia    Hypothyroidism    Myocardial infarct (HCC)    Nephrolithiasis    Osteopenia    Premature atrial contractions    PVC's (premature ventricular contractions)    Snake bite     Past Surgical History:  Procedure Laterality Date   ANGIOPLASTY  1991   CARDIAC CATHETERIZATION  2005   Moderate LCx and LAD disease and severe diffuse RCA disease   CARDIAC CATHETERIZATION  01/2012   normal left main, 50% pLAD stenosis, dLAD mild luminal irregularities, D1 30-40% stenosis at take off; subtotally occluded/severely diseased OM1, mid 80% stenosis in small OM2, long prox and mid RCA stenoses ranging from 60-95%, severely diseased PDA, distal PLSA occluded filling via L-R collateral; normal LV function; inferior lateral basal akinesis.   CHOLECYSTECTOMY  2012   CORONARY PRESSURE/FFR STUDY N/A 10/10/2019   Procedure: INTRAVASCULAR PRESSURE WIRE/FFR STUDY;  Surgeon: Runell Gess, MD;  Location: [facility];   Service: Cardiovascular;  Laterality: N/A;   EYE SURGERY  2010   cataract - right, has left done too   INGUINAL HERNIA REPAIR     LEFT HEART CATH AND CORONARY ANGIOGRAPHY N/A 10/10/2019   Procedure: LEFT HEART CATH AND CORONARY ANGIOGRAPHY;  Surgeon: Runell Gess, MD;  Location: [facility];  Service: Cardiovascular;  Laterality: N/A;   LEFT HEART CATHETERIZATION WITH CORONARY ANGIOGRAM N/A 01/28/2012   Procedure: LEFT HEART CATHETERIZATION WITH CORONARY ANGIOGRAM;  Surgeon: Iran Ouch, MD;  Location: [facility] CATH [facility];  Service: Cardiovascular;  Laterality: N/A;   LEFT HEART CATHETERIZATION WITH CORONARY ANGIOGRAM N/A 03/17/2014   Procedure: LEFT HEART CATHETERIZATION WITH CORONARY ANGIOGRAM;  Surgeon: Kathleene Hazel, MD;  Location: [facility];  Service: Cardiovascular;  Laterality: N/A;   ORIF TIBIA & FIBULA FRACTURES     PTCA     SHOULDER ARTHROSCOPY WITH DISTAL CLAVICLE RESECTION Left 12/16/2018   Procedure: LEFT SHOULDER ARTHROSCOPY WITH DISTAL CLAVICLE RESECTION;  Surgeon: Bjorn Pippin, MD;  Location: [facility];  Service: Orthopedics;  Laterality: Left;   SHOULDER ARTHROSCOPY WITH ROTATOR CUFF REPAIR AND SUBACROMIAL DECOMPRESSION Left 12/16/2018   Procedure: LEFT SHOULDER ARTHROSCOPY WITH ROTATOR CUFF REPAIR AND SUBACROMIAL DECOMPRESSION, DEBRIDEMENT;  Surgeon: Bjorn Pippin, MD;  Location: [facility];  Service: Orthopedics;  Laterality: Left;   SHOULDER ARTHROSCOPY WITH SUBACROMIAL DECOMPRESSION AND BICEP TENDON REPAIR Left  12/16/2018   Procedure: SHOULDER ARTHROSCOPY WITH SUBACROMIAL DECOMPRESSION AND BICEP TENDON REPAIR;  Surgeon: Bjorn Pippin, MD;  Location: [facility];  Service: Orthopedics;  Laterality: Left;   VASECTOMY      Social History:  reports that he has never smoked. He has never used smokeless tobacco. He reports current alcohol use. He reports that he does not use drugs.  Allergies: Allergies  Allergen  Reactions   Meloxicam Other (See Comments) and Anxiety    "jumping out of my skin" Pt feels anxiety attacks when on this medicine    Family History:  Family History  Problem Relation Age of Onset   Asthma Mother    Allergies Sister    Coronary artery disease Other    Colon cancer Neg Hx    Esophageal cancer Neg Hx    Stomach cancer Neg Hx    Rectal cancer Neg Hx      Current Outpatient Medications:    Albuterol (PROVENTIL IN), Inhale into the lungs., Disp: , Rfl:    albuterol (VENTOLIN HFA) 108 (90 Base) MCG/ACT inhaler, Inhale 2 puffs into the lungs every 4 (four) hours as needed for wheezing or shortness of breath., Disp: 18 g, Rfl: 1   ALPRAZolam (XANAX) 0.25 MG tablet, TAKE 1 TABLET BY MOUTH AT BEDTIME AS NEEDED FOR SLEEP, Disp: 30 tablet, Rfl: 0   aspirin EC 81 MG tablet, Take 81 mg by mouth daily., Disp: , Rfl:    atorvastatin (LIPITOR) 40 MG tablet, Take 1 tablet (40 mg total) by mouth daily., Disp: 90 tablet, Rfl: 3   azelastine (ASTELIN) 0.1 % nasal spray, Place 2 sprays into both nostrils 2 (two) times daily., Disp: , Rfl:    Budeson-Glycopyrrol-Formoterol (BREZTRI AEROSPHERE) 160-9-4.8 MCG/ACT AERO, 2 inhalations 1-2 times per day with spacer, Disp: 10.7 g, Rfl: 5   Capsaicin 0.1 % CREA, APPLY SMALL AMOUNT TO AFFECTED AREA DAILY AS NEEDED, Disp: , Rfl:    carvedilol (COREG) 6.25 MG tablet, Take 1 tablet (6.25 mg total) by mouth 2 (two) times daily with a meal., Disp: 180 tablet, Rfl: 1   Cholecalciferol (VITAMIN D3 PO), Take 1 tablet by mouth daily., Disp: , Rfl:    empagliflozin (JARDIANCE) 25 MG TABS tablet, Take 0.5 tablets by mouth every morning. Pt take 12.5 mg once  a day., Disp: , Rfl:    ferrous sulfate 325 (65 FE) MG EC tablet, Take 325 mg by mouth daily. , Disp: , Rfl:    fexofenadine (ALLEGRA) 180 MG tablet, Take 1 tablet (180 mg total) by mouth daily as needed for allergies or rhinitis (Can take an extra dose during flare ups.)., Disp: 90 tablet, Rfl: 1    fluticasone (FLONASE) 50 MCG/ACT nasal spray, USE 1 TO 2 SPRAY(S) IN EACH NOSTRIL ONCE DAILY AS DIRECTED, Disp: 48 g, Rfl: 1   fluticasone (FLOVENT HFA) 110 MCG/ACT inhaler, During asthma flares do 3 inhalations 3 times a day, Disp: 1 each, Rfl: 3   isosorbide mononitrate (IMDUR) 30 MG 24 hr tablet, TAKE ONE TABLET BY MOUTH TWICE DAILY, Disp: 180 tablet, Rfl: 3   levothyroxine (SYNTHROID) 100 MCG tablet, Take 1 tablet by mouth once daily, Disp: 90 tablet, Rfl: 1   Magnesium 400 MG CAPS, Take 400 mg by mouth in the morning and at bedtime., Disp: , Rfl:    nitroGLYCERIN (NITROSTAT) 0.4 MG SL tablet, Place 1 tablet (0.4 mg total) under the tongue every 5 (five) minutes x 3 doses as needed for chest pain.,  Disp: 25 tablet, Rfl: 3   olopatadine (PATANOL) 0.1 % ophthalmic solution, Place 2 drops into both eyes 2 (two) times daily., Disp: 5 mL, Rfl: 5   ondansetron (ZOFRAN) 8 MG tablet, Take 8 mg by mouth every 8 (eight) hours as needed for nausea or vomiting. , Disp: , Rfl:    OVER THE COUNTER MEDICATION, ALA, Disp: , Rfl:    pantoprazole (PROTONIX) 40 MG tablet, Take 1 tablet (40 mg total) by mouth 2 (two) times daily., Disp: 180 tablet, Rfl: 1   pregabalin (LYRICA) 75 MG capsule, Take 1 capsule by mouth twice daily, Disp: 180 capsule, Rfl: 0   Spacer/Aero-Holding Chambers DEVI, 1 Device by Does not apply route as directed., Disp: 1 each, Rfl: 1   tamsulosin (FLOMAX) 0.4 MG CAPS capsule, TAKE ONE CAPSULE BY MOUTH DAILY (Patient not taking: Reported on 02/19/2023), Disp: 30 capsule, Rfl: 5  Review of Systems:  Negative unless indicated in HPI.   Physical Exam: Vitals:   02/23/23 0704  BP: 110/68  Pulse: 70  Temp: 97.6 F (36.4 C)  TempSrc: Oral  SpO2: 96%  Weight: 205 lb 9.6 oz (93.3 kg)  Height: 5\' 11"  (1.803 m)    Body mass index is 28.68 kg/m.   Physical Exam Vitals reviewed.  Constitutional:      General: He is not in acute distress.    Appearance: Normal appearance. He is not  ill-appearing, toxic-appearing or diaphoretic.  HENT:     Head: Normocephalic.     Right Ear: Tympanic membrane, ear canal and external ear normal. There is no impacted cerumen.     Left Ear: Tympanic membrane, ear canal and external ear normal. There is no impacted cerumen.     Nose: Nose normal.     Mouth/Throat:     Mouth: Mucous membranes are moist.     Pharynx: Oropharynx is clear. No oropharyngeal exudate or posterior oropharyngeal erythema.  Eyes:     General: No scleral icterus.       Right eye: No discharge.        Left eye: No discharge.     Conjunctiva/sclera: Conjunctivae normal.     Pupils: Pupils are equal, round, and reactive to light.  Neck:     Vascular: No carotid bruit.  Cardiovascular:     Rate and Rhythm: Normal rate and regular rhythm.     Pulses: Normal pulses.     Heart sounds: Normal heart sounds.  Pulmonary:     Effort: Pulmonary effort is normal. No respiratory distress.     Breath sounds: Normal breath sounds.  Abdominal:     General: Abdomen is flat. Bowel sounds are normal.     Palpations: Abdomen is soft.  Musculoskeletal:        General: Normal range of motion.     Cervical back: Normal range of motion.  Skin:    General: Skin is warm and dry.  Neurological:     General: No focal deficit present.     Mental Status: He is alert and oriented to person, place, and time. Mental status is at baseline.  Psychiatric:        Mood and Affect: Mood normal.        Behavior: Behavior normal.        Thought Content: Thought content normal.        Judgment: Judgment normal.    Subsequent Medicare wellness visit   1. Risk factors, based on past  M,S,F - Cardiac Risk Factors include:  advanced age (>86men, >76 women);male gender   2.  Physical activities: Dietary issues and exercise activities discussed:      3.  Depression/mood:  Flowsheet Row Office Visit from 02/23/2023 in [facility] Total Score 1         4.  ADL's:    02/19/2023    4:51 PM  In your present state of health, do you have any difficulty performing the following activities:  Hearing? 1  Comment patient wears hearing aids  Vision? 0  Difficulty concentrating or making decisions? 0  Walking or climbing stairs? 1  Comment balance  Dressing or bathing? 0  Doing errands, shopping? 0  Preparing Food and eating ? N  Using the Toilet? N  In the past six months, have you accidently leaked urine? N  Do you have problems with loss of bowel control? N  Managing your Medications? N  Managing your Finances? N  Housekeeping or managing your Housekeeping? N     5.  Fall risk:     11/12/2020    4:35 AM 02/18/2022   11:07 AM 06/17/2022    5:34 PM 06/18/2022    3:22 PM 02/19/2023    4:54 PM  Fall Risk  Falls in the past year?  0 0 1 1  Was there an injury with Fall?  0  0 0  Fall Risk Category Calculator  0  1 2  Fall Risk Category (Retired)  Low  Low   (RETIRED) Patient Fall Risk Level Low fall risk Low fall risk  Low fall risk   Patient at Risk for Falls Due to  No Fall Risks  No Fall Risks   Fall risk Follow up  Falls evaluation completed  Falls evaluation completed Falls evaluation completed     6.  Home safety: No problems identified   7.  Height weight, and visual acuity: height and weight as above, vision/hearing: Vision Screening   Right eye Left eye Both eyes  Without correction     With correction 20/30 20/30 20/30      8.  Counseling: Counseling given: Not Answered    9. Lab orders based on risk factors: Laboratory update will be reviewed   10. Cognitive assessment:        02/19/2023    4:55 PM  6CIT Screen  What Year? 0 points  What month? 0 points  What time? 0 points  Count back from 20 0 points  Months in reverse 0 points  Repeat phrase 0 points  Total Score 0 points     11. Screening: Patient provided with a written and personalized 5-10 year screening schedule in the AVS. Health Maintenance   Topic Date Due   Hemoglobin A1C  08/19/2022   Eye exam for diabetics  09/24/2022   Flu Shot  01/08/2023   Yearly kidney function blood test for diabetes  02/19/2023   Yearly kidney health urinalysis for diabetes  02/19/2023   COVID-19 Vaccine (7 - 2023-24 season) 03/07/2023*   Complete foot exam   02/23/2024   Medicare Annual Wellness Visit  02/23/2024   DTaP/Tdap/Td vaccine (4 - Td or Tdap) 10/12/2031   Pneumonia Vaccine  Completed   Zoster (Shingles) Vaccine  Completed   HPV Vaccine  Aged Out  *Topic was postponed. The date shown is not the original due date.    12. Provider List Update: Patient Care Team    Relationship Specialty Notifications Start End  Philip Aspen, [place],  MD PCP - General Internal Medicine  07/14/18   Kathleene Hazel, MD PCP - Cardiology Cardiology Admissions 07/20/17   Mealor, Roberts Gaudy, MD PCP - Electrophysiology Cardiology  08/29/22      13. Advance Directives: Does Patient Have a Medical Advance Directive?: Yes Type of Advance Directive: Living will, Healthcare Power of Attorney Does patient want to make changes to medical advance directive?: No - Patient declined  14. Opioids: Patient is not on any opioid prescriptions and has no risk factors for a substance use disorder.   15.   Goals      Weight (lb) < 200 lb (90.7 kg)         I have personally reviewed and noted the following in the patient's chart:   Medical and social history Use of alcohol, tobacco or illicit drugs  Current medications and supplements Functional ability and status Nutritional status Physical activity Advanced directives List of other physicians Hospitalizations, surgeries, and ER visits in previous 12 months Vitals Screenings to include cognitive, depression, and falls Referrals and appointments  In addition, I have reviewed and discussed with patient certain preventive protocols, quality metrics, and best practice recommendations. A written  personalized care plan for preventive services as well as general preventive health recommendations were provided to patient.   Impression and Plan:  Medicare annual wellness visit, subsequent -     PSA; Future  Type 2 diabetes mellitus with diabetic nephropathy, without long-term current use of insulin (HCC) -     Microalbumin / creatinine urine ratio; Future -     Hemoglobin A1c; Future  Dyslipidemia -     Lipid panel; Future  Essential hypertension -     CBC with Differential/Platelet; Future -     Comprehensive metabolic panel; Future  MYOCARDIAL INFARCTION, HX OF  Mild intermittent asthma without complication  Atherosclerosis of native coronary artery of native heart without angina pectoris  Immunization due  Acquired hypothyroidism -     TSH; Future   -Recommend routine eye and dental care. -Healthy lifestyle discussed in detail. -Labs to be updated today. -Prostate cancer screening: PSA today Health Maintenance  Topic Date Due   Hemoglobin A1C  08/19/2022   Eye exam for diabetics  09/24/2022   Flu Shot  01/08/2023   Yearly kidney function blood test for diabetes  02/19/2023   Yearly kidney health urinalysis for diabetes  02/19/2023   COVID-19 Vaccine (7 - 2023-24 season) 03/07/2023*   Complete foot exam   02/23/2024   Medicare Annual Wellness Visit  02/23/2024   DTaP/Tdap/Td vaccine (4 - Td or Tdap) 10/12/2031   Pneumonia Vaccine  Completed   Zoster (Shingles) Vaccine  Completed   HPV Vaccine  Aged Out  *Topic was postponed. The date shown is not the original due date.   -Flu vaccine in office today.  Advised to update COVID and RSV at pharmacy.    Chaya Jan, MD [facility]

## 2023-02-23 NOTE — Addendum Note (Signed)
Addended by: Kern Reap B on: 02/23/2023 08:22 AM   Modules accepted: Orders

## 2023-03-09 DIAGNOSIS — D631 Anemia in chronic kidney disease: Secondary | ICD-10-CM | POA: Diagnosis not present

## 2023-03-09 DIAGNOSIS — I129 Hypertensive chronic kidney disease with stage 1 through stage 4 chronic kidney disease, or unspecified chronic kidney disease: Secondary | ICD-10-CM | POA: Diagnosis not present

## 2023-03-09 DIAGNOSIS — E1122 Type 2 diabetes mellitus with diabetic chronic kidney disease: Secondary | ICD-10-CM | POA: Diagnosis not present

## 2023-03-09 DIAGNOSIS — N1831 Chronic kidney disease, stage 3a: Secondary | ICD-10-CM | POA: Diagnosis not present

## 2023-03-09 NOTE — Addendum Note (Signed)
Addended by: Kern Reap B on: 03/09/2023 02:44 PM   Modules accepted: Orders

## 2023-03-10 ENCOUNTER — Ambulatory Visit: Payer: Medicare Other | Admitting: Allergy and Immunology

## 2023-03-10 DIAGNOSIS — E119 Type 2 diabetes mellitus without complications: Secondary | ICD-10-CM | POA: Diagnosis not present

## 2023-03-23 ENCOUNTER — Other Ambulatory Visit: Payer: Self-pay | Admitting: Internal Medicine

## 2023-03-23 DIAGNOSIS — G4700 Insomnia, unspecified: Secondary | ICD-10-CM

## 2023-04-10 DIAGNOSIS — E119 Type 2 diabetes mellitus without complications: Secondary | ICD-10-CM | POA: Diagnosis not present

## 2023-04-25 ENCOUNTER — Other Ambulatory Visit: Payer: Self-pay | Admitting: Internal Medicine

## 2023-04-25 DIAGNOSIS — E1142 Type 2 diabetes mellitus with diabetic polyneuropathy: Secondary | ICD-10-CM

## 2023-05-05 ENCOUNTER — Encounter: Payer: Self-pay | Admitting: Allergy and Immunology

## 2023-05-05 ENCOUNTER — Other Ambulatory Visit: Payer: Self-pay

## 2023-05-05 ENCOUNTER — Ambulatory Visit: Payer: Medicare Other | Admitting: Allergy and Immunology

## 2023-05-05 VITALS — BP 136/76 | HR 64 | Temp 98.3°F | Resp 18

## 2023-05-05 DIAGNOSIS — D7589 Other specified diseases of blood and blood-forming organs: Secondary | ICD-10-CM

## 2023-05-05 DIAGNOSIS — J3089 Other allergic rhinitis: Secondary | ICD-10-CM | POA: Diagnosis not present

## 2023-05-05 DIAGNOSIS — J4489 Other specified chronic obstructive pulmonary disease: Secondary | ICD-10-CM

## 2023-05-05 DIAGNOSIS — J301 Allergic rhinitis due to pollen: Secondary | ICD-10-CM

## 2023-05-05 DIAGNOSIS — J310 Chronic rhinitis: Secondary | ICD-10-CM

## 2023-05-05 DIAGNOSIS — K219 Gastro-esophageal reflux disease without esophagitis: Secondary | ICD-10-CM | POA: Diagnosis not present

## 2023-05-05 MED ORDER — BREZTRI AEROSPHERE 160-9-4.8 MCG/ACT IN AERO: INHALATION_SPRAY | RESPIRATORY_TRACT | 5 refills | Status: DC

## 2023-05-05 MED ORDER — ATROVENT HFA 17 MCG/ACT IN AERS: 2.0000 | INHALATION_SPRAY | RESPIRATORY_TRACT | 1 refills | Status: DC | PRN

## 2023-05-05 MED ORDER — FEXOFENADINE HCL 180 MG PO TABS: 180.0000 mg | ORAL_TABLET | Freq: Every day | ORAL | 1 refills | Status: DC | PRN

## 2023-05-05 MED ORDER — ALBUTEROL SULFATE HFA 108 (90 BASE) MCG/ACT IN AERS: 2.0000 | INHALATION_SPRAY | RESPIRATORY_TRACT | 1 refills | Status: DC | PRN

## 2023-05-05 MED ORDER — IPRATROPIUM BROMIDE 0.06 % NA SOLN: NASAL | 5 refills | Status: DC

## 2023-05-05 MED ORDER — OLOPATADINE HCL 0.1 % OP SOLN: 2.0000 [drp] | Freq: Two times a day (BID) | OPHTHALMIC | 5 refills | Status: DC

## 2023-05-05 MED ORDER — AZELASTINE HCL 0.1 % NA SOLN: 2.0000 | Freq: Two times a day (BID) | NASAL | 5 refills | Status: DC

## 2023-05-05 NOTE — Progress Notes (Unsigned)
[place] - Richard Ho   Follow-up Note  Referring Provider: Philip Aspen, Almira Bar* Primary Provider: Philip Aspen, Limmie Patricia, MD Date of Office Visit: 05/05/2023  Subjective:   Richard Ho (DOB: 05-16-1941) is a 82 y.o. male who [facility] in re-evaluation of the following:  HPI: [name] returns to this clinic in evaluation of COPD/asthma overlap, allergic rhinoconjunctivitis, reflux, untreated sleep apnea.  I last saw him in this clinic 02 September 2022.  He states that he is doing very well regarding his airway.  He has not required a systemic steroid or an antibiotic for any type of airway issue.  He still continues to use a combination of Atrovent and albuterol inhaler at least 1 time per day and occasionally twice a day even though he has been consistently using his Breztri.  He can exert himself to the extent he so desires.  He has been having lots of runny drippy nose especially when he eats even while using a nasal steroid and a nasal antihistamine.  His reflux has been under excellent control with Protonix utilize mostly 1 time per day but occasionally twice a day.  He has received the flu vaccine and the COVID-vaccine and the RSV vaccine.  Allergies as of 05/05/2023       Reactions   Meloxicam Other (See Comments), Anxiety   "jumping out of my skin" Pt feels anxiety attacks when on this medicine        Medication List    albuterol 108 (90 Base) MCG/ACT inhaler Commonly known as: VENTOLIN HFA Inhale 2 puffs into the lungs every 4 (four) hours as needed for wheezing or shortness of breath.   ALPRAZolam 0.25 MG tablet Commonly known as: XANAX TAKE 1 TABLET BY MOUTH AT BEDTIME AS NEEDED FOR SLEEP   aspirin EC 81 MG tablet Take 81 mg by mouth daily.   atorvastatin 40 MG tablet Commonly known as: LIPITOR Take 1 tablet (40 mg total) by mouth daily.   azelastine 0.1 % nasal  spray Commonly known as: ASTELIN Place 2 sprays into both nostrils 2 (two) times daily.   Breztri Aerosphere 160-9-4.8 MCG/ACT Aero Generic drug: Budeson-Glycopyrrol-Formoterol 2 inhalations 1-2 times per day with spacer   Capsaicin 0.1 % Crea APPLY SMALL AMOUNT TO AFFECTED AREA DAILY AS NEEDED   carvedilol 6.25 MG tablet Commonly known as: COREG Take 1 tablet (6.25 mg total) by mouth 2 (two) times daily with a meal.   diclofenac Sodium 1 % Gel Commonly known as: VOLTAREN Apply topically.   empagliflozin 25 MG Tabs tablet Commonly known as: JARDIANCE Take 0.5 tablets by mouth every morning. Pt take 12.5 mg once  a day.   ferrous sulfate 325 (65 FE) MG EC tablet Take 325 mg by mouth daily.   fexofenadine 180 MG tablet Commonly known as: ALLEGRA Take 1 tablet (180 mg total) by mouth daily as needed for allergies or rhinitis (Can take an extra dose during flare ups.).   fluticasone 110 MCG/ACT inhaler Commonly known as: Flovent HFA During asthma flares do 3 inhalations 3 times a day   fluticasone 50 MCG/ACT nasal spray Commonly known as: FLONASE USE 1 TO 2 SPRAY(S) IN EACH NOSTRIL ONCE DAILY AS DIRECTED   hypromellose 0.3 % Gel ophthalmic ointment Commonly known as: GENTEAL Apply to eye.   IRON PO every morning.   isosorbide mononitrate 30 MG 24 hr tablet Commonly known as: IMDUR TAKE ONE TABLET  BY MOUTH TWICE DAILY   levothyroxine 100 MCG tablet Commonly known as: SYNTHROID Take 1 tablet by mouth once daily   Magnesium 400 MG Caps Take 400 mg by mouth in the morning and at bedtime.   nitroGLYCERIN 0.4 MG SL tablet Commonly known as: NITROSTAT Place 1 tablet (0.4 mg total) under the tongue every 5 (five) minutes x 3 doses as needed for chest pain.   olopatadine 0.1 % ophthalmic solution Commonly known as: PATANOL Place 2 drops into both eyes 2 (two) times daily.   ondansetron 8 MG tablet Commonly known as: ZOFRAN Take 8 mg by mouth every 8 (eight) hours  as needed for nausea or vomiting.   OVER THE COUNTER MEDICATION ALA   pantoprazole 40 MG tablet Commonly known as: PROTONIX Take 1 tablet (40 mg total) by mouth 2 (two) times daily.   POLYETHYLENE GLYCOL 3350 PO Take by mouth.   pregabalin 75 MG capsule Commonly known as: LYRICA Take 1 capsule by mouth twice daily   PROVENTIL IN Inhale into the lungs.   Refresh Plus 0.5 % Soln Generic drug: Carboxymethylcellulose Sod PF Apply to eye.   Spacer/Aero-Holding [employer] 1 Device by Does not apply route as directed.   tamsulosin 0.4 MG Caps capsule Commonly known as: FLOMAX TAKE ONE CAPSULE BY MOUTH DAILY   traZODone 50 MG tablet Commonly known as: DESYREL Take by mouth.   VITAMIN D3 PO Take 1 tablet by mouth daily.     Past Medical History:  Diagnosis Date   Anemia    Asthma    Cataract    CKD (chronic kidney disease), stage III (HCC)    Coronary artery disease 1991   a) Angioplasty LAD 1991 [place] b) MI in 1995 at  [facility] in [place], med Rx c) Cath 10/2019 for NSTEMI - see report -> med rx.   Diabetes mellitus    GERD (gastroesophageal reflux disease)    Hearing loss    HTN (hypertension)    Hyperlipidemia    Hypothyroidism    Myocardial infarct (HCC)    Nephrolithiasis    Osteopenia    Premature atrial contractions    PVC's (premature ventricular contractions)    Snake bite     Past Surgical History:  Procedure Laterality Date   ANGIOPLASTY  1991   CARDIAC CATHETERIZATION  2005   Moderate LCx and LAD disease and severe diffuse RCA disease   CARDIAC CATHETERIZATION  01/2012   normal left main, 50% pLAD stenosis, dLAD mild luminal irregularities, D1 30-40% stenosis at take off; subtotally occluded/severely diseased OM1, mid 80% stenosis in small OM2, long prox and mid RCA stenoses ranging from 60-95%, severely diseased PDA, distal PLSA occluded filling via L-R collateral; normal LV function; inferior lateral basal akinesis.    CHOLECYSTECTOMY  2012   CORONARY PRESSURE/FFR STUDY N/A 10/10/2019   Procedure: INTRAVASCULAR PRESSURE WIRE/FFR STUDY;  Surgeon: Runell Gess, MD;  Location: [facility] IN[facility]AS[facility] CV [facility];  Service: Cardiovascular;  Laterality: N/A;   EYE SURGERY  2010   cataract - right, has left done too   INGUINAL HERNIA REPAIR     LEFT HEART CATH AND CORONARY ANGIOGRAPHY N/A 10/10/2019   Procedure: LEFT HEART CATH AND CORONARY ANGIOGRAPHY;  Surgeon: Runell Gess, MD;  Location: [facility];  Service: Cardiovascular;  Laterality: N/A;   LEFT HEART CATHETERIZATION WITH CORONARY ANGIOGRAM N/A 01/28/2012   Procedure: LEFT HEART CATHETERIZATION WITH CORONARY ANGIOGRAM;  Surgeon: Iran Ouch, MD;  Location[facility] CATH  LAB;  Service: Cardiovascular;  Laterality: N/A;   LEFT HEART CATHETERIZATION WITH CORONARY ANGIOGRAM N/A 03/17/2014   Procedure: LEFT HEART CATHETERIZATION WITH CORONARY ANGIOGRAM;  Surgeon: Kathleene Hazel, MD;  Location: [facility] CATH LAB;  Service: Cardiovascular;  Laterality: N/A;   ORIF TIBIA & FIBULA FRACTURES     PTCA     SHOULDER ARTHROSCOPY WITH DISTAL CLAVICLE RESECTION Left 12/16/2018   Procedure: LEFT SHOULDER ARTHROSCOPY WITH DISTAL CLAVICLE RESECTION;  Surgeon: Bjorn Pippin, MD;  Location: [facility];  Service: Orthopedics;  Laterality: Left;   SHOULDER ARTHROSCOPY WITH ROTATOR CUFF REPAIR AND SUBACROMIAL DECOMPRESSION Left 12/16/2018   Procedure: LEFT SHOULDER ARTHROSCOPY WITH ROTATOR CUFF REPAIR AND SUBACROMIAL DECOMPRESSION, DEBRIDEMENT;  Surgeon: Bjorn Pippin, MD;  Location: [facility];  Service: Orthopedics;  Laterality: Left;   SHOULDER ARTHROSCOPY WITH SUBACROMIAL DECOMPRESSION AND BICEP TENDON REPAIR Left 12/16/2018   Procedure: SHOULDER ARTHROSCOPY WITH SUBACROMIAL DECOMPRESSION AND BICEP TENDON REPAIR;  Surgeon: Bjorn Pippin, MD;  Location: [facility];  Service: Orthopedics;  Laterality: Left;   VASECTOMY      Review of systems  negative except as noted in HPI / PMHx or noted below:  Review of Systems  Constitutional: Negative.   HENT: Negative.    Eyes: Negative.   Respiratory: Negative.    Cardiovascular: Negative.   Gastrointestinal: Negative.   Genitourinary: Negative.   Musculoskeletal: Negative.   Skin: Negative.   Neurological: Negative.   Endo/Heme/Allergies: Negative.   Psychiatric/Behavioral: Negative.       Objective:   Vitals:   05/05/23 0910  BP: 136/76  Pulse: 64  Resp: 18  Temp: 98.3 F (36.8 C)  SpO2: 94%          Physical Exam Constitutional:      Appearance: He is not diaphoretic.  HENT:     Head: Normocephalic.     Right Ear: Tympanic membrane, ear canal and external ear normal.     Left Ear: Tympanic membrane, ear canal and external ear normal.     Nose: Nose normal. No mucosal edema or rhinorrhea.     Mouth/Throat:     Pharynx: Uvula midline. No oropharyngeal exudate.  Eyes:     Conjunctiva/sclera: Conjunctivae normal.  Neck:     Thyroid: No thyromegaly.     Trachea: Trachea normal. No tracheal tenderness or tracheal deviation.  Cardiovascular:     Rate and Rhythm: Normal rate and regular rhythm.     Heart sounds: Normal heart sounds, S1 normal and S2 normal. No murmur heard. Pulmonary:     Effort: No respiratory distress.     Breath sounds: Normal breath sounds. No stridor. No wheezing or rales.  Lymphadenopathy:     Head:     Right side of head: No tonsillar adenopathy.     Left side of head: No tonsillar adenopathy.     Cervical: No cervical adenopathy.  Skin:    Findings: No erythema or rash.     Nails: There is no clubbing.  Neurological:     Mental Status: He is alert.     Diagnostics:    Spirometry was performed and demonstrated an FEV1 of 1.85 at 64 % of predicted.  The patient had an Asthma Control Test with the following results: ACT Total Score: 17.    Results of blood tests obtained 23 February 2023 identifies WBC 6.3, absolute  eosinophil 200, absolute lymphocyte 1800, hemoglobin 13.2, MCV 103.6, platelet 218  Assessment and Plan:  1. COPD with asthma (HCC)   2. Perennial allergic rhinitis   3. Seasonal allergic rhinitis due to pollen   4. Gustatory rhinitis   5. Gastroesophageal reflux disease, unspecified whether esophagitis present   6. Macrocytosis without anemia    1. Allergen avoidance measures - dust mite, pollens, molds  2. Continue to Treat and prevent inflammation:   A. Breztri - 2 inhalations 2 times per day with spacer  B. Flonase - 1 spray each nostril 1-2 times per day  C. Azelastine - 1 spray each nostril 1-2 times per day  3. Continue treatment for reflux:  A. Protonix 40 mg 1-2 times a day   4. If needed:   A. Albuterol + Atrovent -  2 puffs together every 4-6 hours  B. OTC antihistamine - Allegra  C. Pataday - 1 drop each eye 1 time per day  D. Ipratropium 0.06% - 2 sprays each nostril every 6 hours to dry nose  5. Return to clinic in 6 months or earlier if problem  6. Large RBC - Check folate / b-12 with primary doctor  Eliecer appears to be stable and we are not really going to change too much of his therapy at this point in time and he will continue on anti-inflammatory agents for his airway and continue on therapy for reflux.  I started him some nasal ipratropium that he can use as needed to help with his gustatory rhinitis.  He does appear to have large red blood cells and he does not really drink much alcohol and is not sure that he has had his folate or B12 checked at any point in the past and he can follow-up with his primary care doctor regarding that issue.  I will see him back in this clinic in 6 months or earlier if there is a problem.   Laurette Schimke, MD Allergy / Immunology [facility]

## 2023-05-05 NOTE — Patient Instructions (Addendum)
  1. Allergen avoidance measures - dust mite, pollens, molds  2. Continue to Treat and prevent inflammation:   A. Breztri - 2 inhalations 2 times per day with spacer  B. Flonase - 1 spray each nostril 1-2 times per day  C. Azelastine - 1 spray each nostril 1-2 times per day  3. Continue treatment for reflux:  A. Protonix 40 mg 1-2 times a day   4. If needed:   A. Albuterol + Atrovent -  2 puffs together every 4-6 hours  B. OTC antihistamine - Allegra  C. Pataday - 1 drop each eye 1 time per day  D. Ipratropium 0.06% - 2 sprays each nostril every 6 hours to dry nose  5. Return to clinic in 6 months or earlier if problem  6. Large RBC - Check folate / b-12 with primary doctor

## 2023-05-06 ENCOUNTER — Encounter: Payer: Self-pay | Admitting: Allergy and Immunology

## 2023-05-10 DIAGNOSIS — E119 Type 2 diabetes mellitus without complications: Secondary | ICD-10-CM | POA: Diagnosis not present

## 2023-05-15 ENCOUNTER — Other Ambulatory Visit: Payer: Self-pay | Admitting: Internal Medicine

## 2023-05-15 DIAGNOSIS — G4700 Insomnia, unspecified: Secondary | ICD-10-CM

## 2023-06-10 DIAGNOSIS — E119 Type 2 diabetes mellitus without complications: Secondary | ICD-10-CM | POA: Diagnosis not present

## 2023-07-05 ENCOUNTER — Other Ambulatory Visit: Payer: Self-pay | Admitting: Internal Medicine

## 2023-07-05 DIAGNOSIS — G4700 Insomnia, unspecified: Secondary | ICD-10-CM

## 2023-07-11 DIAGNOSIS — E119 Type 2 diabetes mellitus without complications: Secondary | ICD-10-CM | POA: Diagnosis not present

## 2023-07-13 NOTE — Progress Notes (Signed)
 Cardiology Office Note:  .   Date:  07/17/2023  ID:  JANSEL VONSTEIN, DOB 19-Sep-1940, MRN [id number] PCP: Theophilus Andrews, Tully GRADE, MD  [facility] Providers Cardiologist:  Lonni Cash, MD Electrophysiologist:  Eulas FORBES Furbish, MD    Patient Profile: .      PMH Coronary artery disease Hx angioplasty in [place] and [place] in the 1990s LHC May 2021 CTO of RCA 95% OM stenosis Moderate LAD stenosis Med Rx recommended Hypertension Hyperlipidemia Frequent PVCs CKD Stage III Type 2 DM  History of coronary angioplasty in [place]  and in [place] in the 1990s.  He had increased lower extremity edema in April 2021 and Lasix  was started with improvement in edema.  Echo at that time revealed LVEF 55 to 60%, mild LVH, mild MR.  Carotid artery Dopplers April 2023 with mild bilateral carotid artery disease.  He was admitted to [facility] May 2021 with chest pain, troponin 101.  Cardiac cath 10/10/2019 revealed CTO of RCA, 95% OM stenosis, moderate LAD stenosis.  Medical management was recommended.  He complained of lightheadedness at the follow-up visit and 3-day cardiac monitor revealed frequent PVCs.  He was seen in EP clinic by Dr. Kelsie 12/05/2019 with plan to continue beta-blocker.  Seen by Dr. Cash 07/14/2022 and reported hollow feeling in his chest at night.  LDL was 61 September 2023.  BP was well-controlled.  He did not have any chest pain suggestive of angina.  3-day ZIO monitor repeated to rule out atrial fibrillation.  PVC burden was 9.9%, 6 NSVT runs the longest lasting 5 beats, no atrial fibrillation, average HR 74 bpm.  Carvedilol  was not increased due to soft BP.  He continued to have trouble sleeping and was referred to EP.  Seen by Dr. Furbish, EP on 09/02/2022.  Echo completed 10/06/2022 which revealed mildly reduced LVEF 50 to 55%, G1 DD, moderate LAE, mild MR. PVCs felt to be from 2 morphologies making ablation difficult.  He was advised to continue beta-blocker therapy and  to start magnesium  400 mg twice daily.        History of Present Illness: .   Richard Ho is a very pleasant 83 y.o. male who is here today for follow-up of CAD and frequent PVCs. Reports he is most bothered by rhinitis, described as a persistent runny nose. He believes the design of his house being open architecture and the heating/cooling system is contributing. He reports that the condition is unresponsive to ipratropium inhalation and nasal spray. He also experiences lightheadedness and dizziness, which he attributes to either the rhinitis or potential side effects of his multiple medications. He also reports a worsening of his peripheral neuropathy, with increased sensation in his legs. He notes labored breathing when climbing stairs which improves if he uses his inhaler. History of frequent PVCs but he rarely feels palpitations. He also mentions stress from his work with the [employer] and dealing with the [place], which may be contributing to his symptoms. Has a home gym but admits he has not been very active. Reports a diet high in carbohydrates; is a huge bread lover and needs to work on reducing intake of carbs. He denies chest pain, lower extremity edema, fatigue, palpitations, melena, presyncope, syncope, orthopnea, and PND.   Discussed the use of AI scribe software for clinical note transcription with the patient, who gave verbal consent to proceed.   ROS: See HPI       Studies Reviewed: SABRA   EKG Interpretation Date/Time:  Thursday July 16 2023 09:24:12 EST Ventricular Rate:  69 PR Interval:  216 QRS Duration:  108 QT Interval:  388 QTC Calculation: 415 R Axis:   -9  Text Interpretation: Sinus rhythm with 1st degree A-V block Minimal voltage criteria for LVH, may be normal variant ( R in aVL ) No acute changes Confirmed by Percy Browning [phone]) on 07/16/2023 9:34:25 AM    Risk Assessment/Calculations:             Physical Exam:   VS:  BP 130/70   Pulse 69   Ht 5'  10.5 (1.791 m)   Wt 203 lb (92.1 kg)   SpO2 96%   BMI 28.72 kg/m    Wt Readings from Last 3 Encounters:  07/16/23 203 lb (92.1 kg)  02/23/23 205 lb 9.6 oz (93.3 kg)  09/02/22 207 lb 3.2 oz (94 kg)    GEN: Well nourished, well developed in no acute distress NECK: No JVD; No carotid bruits CARDIAC: RRR, no murmurs, rubs, gallops RESPIRATORY:  Clear to auscultation without rales, wheezing or rhonchi  ABDOMEN: Soft, non-tender, protuberant EXTREMITIES:  No edema; No deformity     ASSESSMENT AND PLAN: .    CAD: History of CTO of RCA, 95% OM stenosis, and moderate LAD stenosis on cath  10/2019. He has increased shortness of breath walking up his stairs but this improves with use of inhaler. He denies chest pain, dyspnea, or other symptoms concerning for angina.  No indication for further ischemic evaluation at this time. No bleeding problems. Continue GDMT inclduing aspirin , carvedilol , atorvastatin , Imdur .   PVCs: He denies palpitations. No evidence of frequent PVCs on EKG today. He is unsure if symptoms improved on magnesium  but he reports he continues to take it.  HR is well controlled. Continue carvedilol .   HFmrEF: Most recent echo 09/2022 revealed mildly reduced LVEF 50 to 55%, G1 DD, moderately dilated LA, mild MR.  He denies shortness of breath, orthopnea, PND, or edema.  Reports weight is stable.  He appears euvolemic on exam.  We will continue GDMT including carvedilol , empagliflozin, Imdur .  Mitral regurgitation: Mild mitral regurgitation on TTE 09/2022. He is asymptomatic. I do not appreciated a significant murmur on exam. We will continue to monitor clinically for now.   Hyperlipidemia LDL goal < 70: Lipid panel completed 02/23/23 revealed total cholesterol 124, HDL 35.8, LDL 59, and triglycerides 854.  He reports he has upcoming lab work at the end of February.  I have asked him to send results to us .  Cholesterol is well-controlled.  Continue atorvastatin .  Hypertension: BP is well  controlled. No medication changes today.         Disposition:1 year with Dr. Verlin  Signed, Browning Percy, NP-C

## 2023-07-16 ENCOUNTER — Encounter: Payer: Self-pay | Admitting: Nurse Practitioner

## 2023-07-16 ENCOUNTER — Ambulatory Visit: Payer: Medicare Other | Attending: Nurse Practitioner | Admitting: Nurse Practitioner

## 2023-07-16 VITALS — BP 130/70 | HR 69 | Ht 70.5 in | Wt 203.0 lb

## 2023-07-16 DIAGNOSIS — I493 Ventricular premature depolarization: Secondary | ICD-10-CM | POA: Diagnosis not present

## 2023-07-16 DIAGNOSIS — E785 Hyperlipidemia, unspecified: Secondary | ICD-10-CM

## 2023-07-16 DIAGNOSIS — I10 Essential (primary) hypertension: Secondary | ICD-10-CM | POA: Diagnosis not present

## 2023-07-16 DIAGNOSIS — I340 Nonrheumatic mitral (valve) insufficiency: Secondary | ICD-10-CM

## 2023-07-16 DIAGNOSIS — I5022 Chronic systolic (congestive) heart failure: Secondary | ICD-10-CM

## 2023-07-16 DIAGNOSIS — I25118 Atherosclerotic heart disease of native coronary artery with other forms of angina pectoris: Secondary | ICD-10-CM

## 2023-07-16 NOTE — Patient Instructions (Addendum)
 Medication Instructions:  Your physician recommends that you continue on your current medications as directed. Please refer to the Current Medication list given to you today.  *If you need a refill on your cardiac medications before your next appointment, please call your pharmacy*  Lab Work: If you have labs (blood work) drawn today and your tests are completely normal, you will receive your results only by: MyChart Message (if you have MyChart) OR A paper copy in the mail If you have any lab test that is abnormal or we need to change your treatment, we will call you to review the results.  Testing/Procedures: None ordered today.  Follow-Up: At [facility], you and your health needs are our priority.  As part of our continuing mission to provide you with exceptional heart care, we have created designated Provider Care Teams.  These Care Teams include your primary Cardiologist (physician) and Advanced Practice Providers (APPs -  Physician Assistants and Nurse Practitioners) who all work together to provide you with the care you need, when you need it.  We recommend signing up for the patient portal called "MyChart".  Sign up information is provided on this After Visit Summary.  MyChart is used to connect with patients for Virtual Visits (Telemedicine).  Patients are able to view lab/test results, encounter notes, upcoming appointments, etc.  Non-urgent messages can be sent to your provider as well.   To learn more about what you can do with MyChart, go to [email or web address].    Your next appointment:   1 year(s)  Provider:   Verne Carrow, MD     Other Instructions       1st Floor: - Lobby - Registration  - Pharmacy  - Lab - Cafe  2nd Floor: - PV Lab - Diagnostic Testing (echo, CT, nuclear med)  3rd Floor: - Vacant  4th Floor: - TCTS (cardiothoracic surgery) - AFib Clinic - Structural Heart Clinic - Vascular Surgery  - Vascular Ultrasound  5th  Floor: - [facility] Cardiology (general and EP) - Clinical Pharmacy for coumadin, hypertension, lipid, weight-loss medications, and med management appointments    Valet parking services will be available as well.

## 2023-07-17 ENCOUNTER — Encounter: Payer: Self-pay | Admitting: Nurse Practitioner

## 2023-08-04 ENCOUNTER — Other Ambulatory Visit: Payer: Self-pay | Admitting: Internal Medicine

## 2023-08-04 DIAGNOSIS — E1142 Type 2 diabetes mellitus with diabetic polyneuropathy: Secondary | ICD-10-CM

## 2023-08-08 DIAGNOSIS — E119 Type 2 diabetes mellitus without complications: Secondary | ICD-10-CM | POA: Diagnosis not present

## 2023-09-08 DIAGNOSIS — E119 Type 2 diabetes mellitus without complications: Secondary | ICD-10-CM | POA: Diagnosis not present

## 2023-09-15 ENCOUNTER — Other Ambulatory Visit: Payer: Self-pay | Admitting: Internal Medicine

## 2023-09-15 DIAGNOSIS — G4700 Insomnia, unspecified: Secondary | ICD-10-CM

## 2023-10-08 DIAGNOSIS — E119 Type 2 diabetes mellitus without complications: Secondary | ICD-10-CM | POA: Diagnosis not present

## 2023-10-26 DIAGNOSIS — H524 Presbyopia: Secondary | ICD-10-CM | POA: Diagnosis not present

## 2023-10-26 LAB — HM DIABETES EYE EXAM

## 2023-10-27 ENCOUNTER — Ambulatory Visit: Payer: Medicare Other | Admitting: Allergy and Immunology

## 2023-10-27 VITALS — BP 108/70 | HR 83 | Temp 98.1°F | Resp 14 | Ht 70.0 in | Wt 199.1 lb

## 2023-10-27 DIAGNOSIS — J3089 Other allergic rhinitis: Secondary | ICD-10-CM | POA: Diagnosis not present

## 2023-10-27 DIAGNOSIS — J301 Allergic rhinitis due to pollen: Secondary | ICD-10-CM | POA: Diagnosis not present

## 2023-10-27 DIAGNOSIS — J189 Pneumonia, unspecified organism: Secondary | ICD-10-CM

## 2023-10-27 DIAGNOSIS — J441 Chronic obstructive pulmonary disease with (acute) exacerbation: Secondary | ICD-10-CM

## 2023-10-27 DIAGNOSIS — G4733 Obstructive sleep apnea (adult) (pediatric): Secondary | ICD-10-CM

## 2023-10-27 DIAGNOSIS — K219 Gastro-esophageal reflux disease without esophagitis: Secondary | ICD-10-CM | POA: Diagnosis not present

## 2023-10-27 MED ORDER — FLUTICASONE PROPIONATE 50 MCG/ACT NA SUSP: 2.0000 | Freq: Every day | NASAL | 5 refills | Status: AC

## 2023-10-27 MED ORDER — AZELASTINE HCL 0.1 % NA SOLN: 1.0000 | Freq: Two times a day (BID) | NASAL | 1 refills | Status: AC

## 2023-10-27 MED ORDER — PANTOPRAZOLE SODIUM 40 MG PO TBEC: 40.0000 mg | DELAYED_RELEASE_TABLET | Freq: Two times a day (BID) | ORAL | 1 refills | Status: AC

## 2023-10-27 MED ORDER — FLUTICASONE-SALMETEROL 250-50 MCG/ACT IN AEPB: 1.0000 | INHALATION_SPRAY | Freq: Two times a day (BID) | RESPIRATORY_TRACT | 1 refills | Status: AC

## 2023-10-27 MED ORDER — ALBUTEROL SULFATE HFA 108 (90 BASE) MCG/ACT IN AERS: 2.0000 | INHALATION_SPRAY | RESPIRATORY_TRACT | 1 refills | Status: AC | PRN

## 2023-10-27 MED ORDER — SPIRIVA RESPIMAT 1.25 MCG/ACT IN AERS: 2.0000 | INHALATION_SPRAY | Freq: Every morning | RESPIRATORY_TRACT | 1 refills | Status: AC

## 2023-10-27 MED ORDER — OLOPATADINE HCL 0.1 % OP SOLN: 2.0000 [drp] | Freq: Two times a day (BID) | OPHTHALMIC | 1 refills | Status: AC

## 2023-10-27 MED ORDER — METHYLPREDNISOLONE ACETATE 80 MG/ML IJ SUSP
80.0000 mg | Freq: Once | INTRAMUSCULAR | Status: AC
  Administered 2023-10-27: 80 mg via INTRAMUSCULAR

## 2023-10-27 NOTE — Patient Instructions (Addendum)
  1. Allergen avoidance measures - dust mite, pollens, molds  2. Continue to Treat and prevent inflammation:   A. Wixela 250 - 1 inhalation 2 times per day (empty lungs)  B. Spiriva 1.25 - 2 inhaltions 1 time per day (empty lungs)  C. Flonase  - 1 spray each nostril 1-2 times per day  D. Azelastine  - 1 spray each nostril 1-2 times per day  E. Depomedrol 80 IM delivered in clinic today  3. Continue treatment for reflux:  A. Protonix  40 mg 1-2 times a day   4. If needed:   A. Albuterol  -  2 puffs together every 4-6 hours  B. OTC antihistamine - Allegra   C. Pataday  - 1 drop each eye 1 time per day  5. Return to clinic in 4 weeks or earlier if problem  6. Repeat imaging of lungs???

## 2023-10-27 NOTE — Progress Notes (Signed)
 [facility] - [place] [facility] [place] - [place] - [place]   Follow-up Note  Referring Provider: Zilphia Ho, Richard Ho Richard Ho* Primary Provider: Zilphia Ho, Richard Coppersmith, MD Date of Office Visit: 10/27/2023  Subjective:   Richard Ho (DOB: May 14, 1941) is a 83 y.o. male who [facility] in re-evaluation of the following:  HPI: Richard Ho returns to this clinic in reevaluation of COPD/asthma overlap, allergic rhinoconjunctivitis, reflux, untreated sleep apnea.  I last saw him in this clinic 05 May 2023.  Apparently he had his Breztri  discontinued by the [place] and was given Spiriva in its place sometime in mid April because he was having some continued cough.  After that change he got significantly worse with his cough and he had a chest x-ray on 06 October 2022 which identified "left lower lobe pneumonia" and he was given doxycycline  and is basically the same.  He still has lots of cough.  He is using a combination of Spiriva in the morning and then albuterol  throughout the day.  He never had any fever or chills or aches or associated systemic or constitutional symptoms.  Occasionally when he coughs real hard he might get some sputum up but there is never any blood but occasionally ugly sputum.  He has had very little problems with his upper airways.  He has had very little problems with reflux.  He does not treat his sleep apnea.  Allergies as of 10/27/2023       Reactions   Meloxicam Other (See Comments), Anxiety   "jumping out of my skin" Pt feels anxiety attacks when on this medicine        Medication List    albuterol  108 (90 Base) MCG/ACT inhaler Commonly known as: VENTOLIN  HFA Inhale 2 puffs into the lungs every 4 (four) hours as needed for wheezing or shortness of breath.   ALPRAZolam  0.25 MG tablet Commonly known as: XANAX  TAKE 1 TABLET BY MOUTH AT BEDTIME AS NEEDED FOR SLEEP   aspirin  EC 81 MG tablet Take 81 mg by mouth  daily.   atorvastatin  40 MG tablet Commonly known as: LIPITOR Take 1 tablet (40 mg total) by mouth daily.   azelastine  0.1 % nasal spray Commonly known as: ASTELIN  Place 2 sprays into both nostrils 2 (two) times daily.   Capsaicin 0.1 % Crea APPLY SMALL AMOUNT TO AFFECTED AREA DAILY AS NEEDED   carvedilol  6.25 MG tablet Commonly known as: COREG  Take 1 tablet (6.25 mg total) by mouth 2 (two) times daily with a meal.   diclofenac Sodium 1 % Gel Commonly known as: VOLTAREN Apply topically.   empagliflozin 25 MG Tabs tablet Commonly known as: JARDIANCE Take 0.5 tablets by mouth every morning. Pt take 12.5 mg once  a day.   ferrous sulfate 325 (65 FE) MG EC tablet Take 325 mg by mouth daily.   fluticasone  110 MCG/ACT inhaler Commonly known as: Flovent  HFA During asthma flares do 3 inhalations 3 times a day   hypromellose 0.3 % Gel ophthalmic ointment Commonly known as: GENTEAL Apply to eye.   IRON PO every morning.   isosorbide  mononitrate 30 MG 24 hr tablet Commonly known as: IMDUR  TAKE ONE TABLET BY MOUTH TWICE DAILY   levothyroxine  100 MCG tablet Commonly known as: SYNTHROID  Take 1 tablet by mouth once daily   Magnesium  400 MG Caps Take 400 mg by mouth in the morning and at bedtime.   nitroGLYCERIN  0.4 MG SL tablet Commonly known as: NITROSTAT  Place  1 tablet (0.4 mg total) under the tongue every 5 (five) minutes x 3 doses as needed for chest pain.   olopatadine  0.1 % ophthalmic solution Commonly known as: PATANOL Place 2 drops into both eyes 2 (two) times daily.   ondansetron  8 MG tablet Commonly known as: ZOFRAN  Take 8 mg by mouth every 8 (eight) hours as needed for nausea or vomiting.   OVER THE COUNTER MEDICATION ALA   pantoprazole  40 MG tablet Commonly known as: PROTONIX  Take 1 tablet (40 mg total) by mouth 2 (two) times daily.   POLYETHYLENE GLYCOL 3350 PO Take by mouth.   pregabalin  75 MG capsule Commonly known as: LYRICA  Take 1 capsule by  mouth twice daily   PROVENTIL  IN Inhale into the lungs.   Refresh Plus 0.5 % Soln Generic drug: Carboxymethylcellulose Sod PF Apply to eye.   Spacer/[employer]ero-[employer] 1 Device by Does not apply route as directed.   Spiriva Respimat 1.25 MCG/ACT Aers Generic drug: Tiotropium Bromide Monohydrate Inhale 1 Inhalation into the lungs in the morning and at bedtime.   tamsulosin  0.4 MG Caps capsule Commonly known as: FLOMAX  TAKE ONE CAPSULE BY MOUTH DAILY   traZODone  50 MG tablet Commonly known as: DESYREL  Take by mouth.   VITAMIN D3 PO Take 1 tablet by mouth daily.    Past Medical History:  Diagnosis Date   Anemia    Asthma    Cataract    CKD (chronic kidney disease), stage III (HCC)    Coronary artery disease 1991   a) Angioplasty LAD 1991 [place] [facility] b) MI in 1995 at  [facility] in [place] , med Rx c) Cath 10/2019 for NSTEMI - see report -> med rx.   Diabetes mellitus    GERD (gastroesophageal reflux disease)    Hearing loss    HTN (hypertension)    Hyperlipidemia    Hypothyroidism    Myocardial infarct (HCC)    Nephrolithiasis    Osteopenia    Premature atrial contractions    PVC's (premature ventricular contractions)    Snake bite     Past Surgical History:  Procedure Laterality Date   ANGIOPLASTY  1991   CARDIAC CATHETERIZATION  2005   Moderate LCx and LAD disease and severe diffuse RCA disease   CARDIAC CATHETERIZATION  01/2012   normal left main, 50% pLAD stenosis, dLAD mild luminal irregularities, D1 30-40% stenosis at take off; subtotally occluded/severely diseased OM1, mid 80% stenosis in small OM2, long prox and mid RCA stenoses ranging from 60-95%, severely diseased PDA, distal PLSA occluded filling via L-R collateral; normal LV function; inferior lateral basal akinesis.   CHOLECYSTECTOMY  2012   CORONARY PRESSURE/FFR STUDY N/A 10/10/2019   Procedure: INTRAVASCULAR PRESSURE WIRE/FFR STUDY;  Surgeon: Avanell Leigh, MD;  Location: [facility];  Service: Cardiovascular;  Laterality: N/A;   EYE SURGERY  2010   cataract - right, has left done too   INGUINAL HERNIA REPAIR     LEFT HEART CATH AND CORONARY ANGIOGRAPHY N/A 10/10/2019   Procedure: LEFT HEART CATH AND CORONARY ANGIOGRAPHY;  Surgeon: Avanell Leigh, MD;  Location: [facility];  Service: Cardiovascular;  Laterality: N/A;   LEFT HEART CATHETERIZATION WITH CORONARY ANGIOGRAM N/A 01/28/2012   Procedure: LEFT HEART CATHETERIZATION WITH CORONARY ANGIOGRAM;  Surgeon: Wenona Hamilton, MD;  Location: [facility] CATH LAB;  Service: Cardiovascular;  Laterality: N/A;   LEFT HEART CATHETERIZATION WITH CORONARY ANGIOGRAM N/A 03/17/2014   Procedure: LEFT HEART CATHETERIZATION WITH CORONARY ANGIOGRAM;  Surgeon: Odie Benne, MD;  Location: [facility] CATH LAB;  Service: Cardiovascular;  Laterality: N/A;   ORIF TIBIA & FIBULA FRACTURES     PTCA     SHOULDER ARTHROSCOPY WITH DISTAL CLAVICLE RESECTION Left 12/16/2018   Procedure: LEFT SHOULDER ARTHROSCOPY WITH DISTAL CLAVICLE RESECTION;  Surgeon: Micheline Ahr, MD;  Location: [facility];  Service: Orthopedics;  Laterality: Left;   SHOULDER ARTHROSCOPY WITH ROTATOR CUFF REPAIR AND SUBACROMIAL DECOMPRESSION Left 12/16/2018   Procedure: LEFT SHOULDER ARTHROSCOPY WITH ROTATOR CUFF REPAIR AND SUBACROMIAL DECOMPRESSION, DEBRIDEMENT;  Surgeon: Micheline Ahr, MD;  Location:  SURGERY CENTER;  Service: Orthopedics;  Laterality: Left;   SHOULDER ARTHROSCOPY WITH SUBACROMIAL DECOMPRESSION AND BICEP TENDON REPAIR Left 12/16/2018   Procedure: SHOULDER ARTHROSCOPY WITH SUBACROMIAL DECOMPRESSION AND BICEP TENDON REPAIR;  Surgeon: Micheline Ahr, MD;  Location:  SURGERY CENTER;  Service: Orthopedics;  Laterality: Left;   VASECTOMY      Review of systems negative except as noted in HPI / PMHx or noted below:  Review of Systems  Constitutional: Negative.   HENT: Negative.    Eyes: Negative.   Respiratory: Negative.     Cardiovascular: Negative.   Gastrointestinal: Negative.   Genitourinary: Negative.   Musculoskeletal: Negative.   Skin: Negative.   Neurological: Negative.   Endo/Heme/Allergies: Negative.   Psychiatric/Behavioral: Negative.       Objective:   Vitals:   10/27/23 0844  BP: 108/70  Pulse: 83  Resp: 14  Temp: 98.1 F (36.7 C)  SpO2: 94%   Height: 5\' 10"  (177.8 cm)  Weight: 199 lb 1.6 oz (90.3 kg)   Physical Exam Constitutional:      Appearance: He is not diaphoretic.  HENT:     Head: Normocephalic.     Right Ear: Tympanic membrane, ear canal and external ear normal.     Left Ear: Tympanic membrane, ear canal and external ear normal.     Nose: Nose normal. No mucosal edema or rhinorrhea.     Mouth/Throat:     Pharynx: Uvula midline. No oropharyngeal exudate.  Eyes:     Conjunctiva/sclera: Conjunctivae normal.  Neck:     Thyroid : No thyromegaly.     Trachea: Trachea normal. No tracheal tenderness or tracheal deviation.  Cardiovascular:     Rate and Rhythm: Normal rate and regular rhythm.     Heart sounds: Normal heart sounds, S1 normal and S2 normal. No murmur heard. Pulmonary:     Effort: No respiratory distress.     Breath sounds: No stridor. Wheezing (Scattered inspiratory and expiratory wheezes all lung fields) present. No rales.  Lymphadenopathy:     Head:     Right side of head: No tonsillar adenopathy.     Left side of head: No tonsillar adenopathy.     Cervical: No cervical adenopathy.  Skin:    Findings: No erythema or rash.     Nails: There is no clubbing.  Neurological:     Mental Status: He is alert.     Diagnostics:    Spirometry was performed and demonstrated an FEV1 of 1.35 at 48 % of predicted.  Cardiovascular/Mediastinum: Heart size within normal limits.  Normal mediastinal contours.  Lungs/Pleura: Patchy left basilar opacities. No pleural effusion  or pneumothorax.   Assessment and Plan:   1. Asthma with COPD with exacerbation (HCC)    2. Perennial allergic rhinitis   3. Seasonal allergic rhinitis due to pollen   4. Gastroesophageal reflux disease, unspecified whether esophagitis present  5. Obstructive sleep apnea   6. Pneumonia of left lower lobe due to infectious organism     1. Allergen avoidance measures - dust mite, pollens, molds  2. Continue to Treat and prevent inflammation:   A. Wixela 250 - 1 inhalation 2 times per day (empty lungs)  B. Spiriva 1.25 - 2 inhaltions 1 time per day (empty lungs)  C. Flonase  - 1 spray each nostril 1-2 times per day  D. Azelastine  - 1 spray each nostril 1-2 times per day  E. Depomedrol 80 IM delivered in clinic today  3. Continue treatment for reflux:  A. Protonix  40 mg 1-2 times a day   4. If needed:   A. Albuterol  -  2 puffs together every 4-6 hours  B. OTC antihistamine - Allegra   C. Pataday  - 1 drop each eye 1 time per day  5. Return to clinic in 4 weeks or earlier if problem  6. Repeat imaging of lungs???  Dorsel certainly has inflammation of his lung and it does not appear to be just his left lower lobe that is involved with an inflammatory process.  I am going to restart him on anti-inflammatory agents for his airway by introducing Wixela to his Spiriva and I have given him a systemic steroid today.  I will see him back in this clinic in 4 weeks and we will make a determination about whether or not his airway needs to be imaged further to investigate the abnormality on his 06 October 2023 chest x-ray.  Schuyler Custard, MD Allergy / Immunology [facility]

## 2023-10-28 ENCOUNTER — Encounter: Payer: Self-pay | Admitting: Allergy and Immunology

## 2023-11-03 ENCOUNTER — Other Ambulatory Visit: Payer: Self-pay | Admitting: Internal Medicine

## 2023-11-03 DIAGNOSIS — G4700 Insomnia, unspecified: Secondary | ICD-10-CM

## 2023-11-03 DIAGNOSIS — E1142 Type 2 diabetes mellitus with diabetic polyneuropathy: Secondary | ICD-10-CM

## 2023-11-08 DIAGNOSIS — E119 Type 2 diabetes mellitus without complications: Secondary | ICD-10-CM | POA: Diagnosis not present

## 2023-11-16 ENCOUNTER — Ambulatory Visit: Admitting: Family Medicine

## 2023-11-17 ENCOUNTER — Ambulatory Visit: Admitting: Allergy and Immunology

## 2023-11-19 ENCOUNTER — Emergency Department (HOSPITAL_BASED_OUTPATIENT_CLINIC_OR_DEPARTMENT_OTHER)

## 2023-11-19 ENCOUNTER — Encounter (HOSPITAL_BASED_OUTPATIENT_CLINIC_OR_DEPARTMENT_OTHER): Payer: Self-pay

## 2023-11-19 ENCOUNTER — Emergency Department (HOSPITAL_BASED_OUTPATIENT_CLINIC_OR_DEPARTMENT_OTHER)
Admission: EM | Admit: 2023-11-19 | Discharge: 2023-11-19 | Disposition: A | Discharge status: Home / Self Care | Attending: Emergency Medicine | Admitting: Emergency Medicine

## 2023-11-19 ENCOUNTER — Other Ambulatory Visit: Payer: Self-pay

## 2023-11-19 DIAGNOSIS — R29898 Other symptoms and signs involving the musculoskeletal system: Secondary | ICD-10-CM | POA: Diagnosis not present

## 2023-11-19 DIAGNOSIS — E860 Dehydration: Secondary | ICD-10-CM | POA: Insufficient documentation

## 2023-11-19 DIAGNOSIS — J40 Bronchitis, not specified as acute or chronic: Secondary | ICD-10-CM | POA: Insufficient documentation

## 2023-11-19 DIAGNOSIS — Z7951 Long term (current) use of inhaled steroids: Secondary | ICD-10-CM | POA: Insufficient documentation

## 2023-11-19 DIAGNOSIS — Z79899 Other long term (current) drug therapy: Secondary | ICD-10-CM | POA: Insufficient documentation

## 2023-11-19 DIAGNOSIS — I6782 Cerebral ischemia: Secondary | ICD-10-CM | POA: Diagnosis not present

## 2023-11-19 DIAGNOSIS — N189 Chronic kidney disease, unspecified: Secondary | ICD-10-CM | POA: Insufficient documentation

## 2023-11-19 DIAGNOSIS — I129 Hypertensive chronic kidney disease with stage 1 through stage 4 chronic kidney disease, or unspecified chronic kidney disease: Secondary | ICD-10-CM | POA: Diagnosis not present

## 2023-11-19 DIAGNOSIS — Z7982 Long term (current) use of aspirin: Secondary | ICD-10-CM | POA: Diagnosis not present

## 2023-11-19 DIAGNOSIS — J45909 Unspecified asthma, uncomplicated: Secondary | ICD-10-CM | POA: Insufficient documentation

## 2023-11-19 DIAGNOSIS — I2510 Atherosclerotic heart disease of native coronary artery without angina pectoris: Secondary | ICD-10-CM | POA: Insufficient documentation

## 2023-11-19 DIAGNOSIS — R29818 Other symptoms and signs involving the nervous system: Secondary | ICD-10-CM | POA: Diagnosis not present

## 2023-11-19 DIAGNOSIS — R42 Dizziness and giddiness: Secondary | ICD-10-CM | POA: Diagnosis not present

## 2023-11-19 LAB — DIFFERENTIAL
Abs Immature Granulocytes: 0.06 10*3/uL (ref 0.00–0.07)
Basophils Absolute: 0 10*3/uL (ref 0.0–0.1)
Basophils Relative: 1 %
Eosinophils Absolute: 0.1 10*3/uL (ref 0.0–0.5)
Eosinophils Relative: 1 %
Immature Granulocytes: 1 %
Lymphocytes Relative: 23 %
Lymphs Abs: 1.4 10*3/uL (ref 0.7–4.0)
Monocytes Absolute: 0.7 10*3/uL (ref 0.1–1.0)
Monocytes Relative: 11 %
Neutro Abs: 4 10*3/uL (ref 1.7–7.7)
Neutrophils Relative %: 63 %

## 2023-11-19 LAB — URINE DRUG SCREEN
Amphetamines: NOT DETECTED
Barbiturates: NOT DETECTED
Benzodiazepines: NOT DETECTED
Cocaine: NOT DETECTED
Fentanyl: NOT DETECTED
Methadone Scn, Ur: NOT DETECTED
Opiates: NOT DETECTED
Tetrahydrocannabinol: NOT DETECTED

## 2023-11-19 LAB — URINALYSIS, W/ REFLEX TO CULTURE (INFECTION SUSPECTED)
Bacteria, UA: NONE SEEN
Bilirubin Urine: NEGATIVE
Glucose, UA: 500 mg/dL — AB
Hgb urine dipstick: NEGATIVE
Ketones, ur: NEGATIVE mg/dL
Leukocytes,Ua: NEGATIVE
Nitrite: NEGATIVE
Protein, ur: NEGATIVE mg/dL
Specific Gravity, Urine: 1.016 (ref 1.005–1.030)
pH: 6 (ref 5.0–8.0)

## 2023-11-19 LAB — CBC
HCT: 40.6 % (ref 39.0–52.0)
Hemoglobin: 13.5 g/dL (ref 13.0–17.0)
MCH: 34.2 pg — ABNORMAL HIGH (ref 26.0–34.0)
MCHC: 33.3 g/dL (ref 30.0–36.0)
MCV: 102.8 fL — ABNORMAL HIGH (ref 80.0–100.0)
Platelets: 189 10*3/uL (ref 150–400)
RBC: 3.95 MIL/uL — ABNORMAL LOW (ref 4.22–5.81)
RDW: 14.5 % (ref 11.5–15.5)
WBC: 6.2 10*3/uL (ref 4.0–10.5)
nRBC: 0 % (ref 0.0–0.2)

## 2023-11-19 LAB — COMPREHENSIVE METABOLIC PANEL WITH GFR
ALT: 25 U/L (ref 0–44)
AST: 23 U/L (ref 15–41)
Albumin: 4.4 g/dL (ref 3.5–5.0)
Alkaline Phosphatase: 126 U/L (ref 38–126)
Anion gap: 12 (ref 5–15)
BUN: 22 mg/dL (ref 8–23)
CO2: 22 mmol/L (ref 22–32)
Calcium: 10 mg/dL (ref 8.9–10.3)
Chloride: 104 mmol/L (ref 98–111)
Creatinine, Ser: 1.4 mg/dL — ABNORMAL HIGH (ref 0.61–1.24)
GFR, Estimated: 50 mL/min — ABNORMAL LOW (ref >60)
Glucose, Bld: 113 mg/dL — ABNORMAL HIGH (ref 70–99)
Potassium: 4.8 mmol/L (ref 3.5–5.1)
Sodium: 137 mmol/L (ref 135–145)
Total Bilirubin: 0.8 mg/dL (ref 0.0–1.2)
Total Protein: 6.8 g/dL (ref 6.5–8.1)

## 2023-11-19 LAB — PROTIME-INR
INR: 1 (ref 0.8–1.2)
Prothrombin Time: 13 s (ref 11.4–15.2)

## 2023-11-19 LAB — ETHANOL: Alcohol, Ethyl (B): <15 mg/dL (ref <15)

## 2023-11-19 MED ORDER — SODIUM CHLORIDE 0.9 % IV BOLUS
500.0000 mL | Freq: Once | INTRAVENOUS | Status: AC
  Administered 2023-11-19: 500 mL via INTRAVENOUS

## 2023-11-19 MED ORDER — PREDNISONE 50 MG PO TABS: 50.0000 mg | ORAL_TABLET | Freq: Every day | ORAL | 0 refills | Status: AC

## 2023-11-19 MED ORDER — LORAZEPAM 2 MG/ML IJ SOLN
1.0000 mg | Freq: Once | INTRAMUSCULAR | Status: AC
  Administered 2023-11-19: 1 mg via INTRAVENOUS
  Filled 2023-11-19: qty 1

## 2023-11-19 MED ORDER — AMOXICILLIN-POT CLAVULANATE 875-125 MG PO TABS: 1.0000 | ORAL_TABLET | Freq: Two times a day (BID) | ORAL | 0 refills | Status: AC

## 2023-11-19 NOTE — ED Provider Notes (Signed)
 [place] EMERGENCY DEPARTMENT AT [facility] Provider Note   CSN: [id number] Arrival date & time: 11/19/23  1012     Patient presents with: Dizziness and Weakness   Richard Ho is a 83 y.o. male.   Pt is a 83 yo male with pmhx significant for asthma, CAD, GERD, HLD, CKD, HTN, and anemia.  Pt said he finished abx for pna about 3 weeks ago and has not had much of an appetite since then.  Pt said he woke up 2 days ago feeling dizzy when he moves.  He denies any other neurologic sx.  Pt also reports cough since April.  He was put on doxy which did not seem to help.  He was seen by Dr. Kozlow (allergy) who gave him a steroid injection.  He is still coughing.         Prior to Admission medications   Medication Sig Start Date End Date Taking? Authorizing Provider  amoxicillin -clavulanate (AUGMENTIN ) 875-125 MG tablet Take 1 tablet by mouth every 12 (twelve) hours. 11/19/23  Yes Sueellen Emery, MD  predniSONE  (DELTASONE ) 50 MG tablet Take 1 tablet (50 mg total) by mouth daily with breakfast. 11/19/23  Yes Sueellen Emery, MD  Albuterol  (PROVENTIL  IN) Inhale into the lungs.    [provider]  albuterol  (VENTOLIN  HFA) 108 (90 Base) MCG/ACT inhaler Inhale 2 puffs into the lungs every 4 (four) hours as needed for wheezing or shortness of breath. 10/27/23   Kozlow, Rema Care, MD  ALPRAZolam  (XANAX ) 0.25 MG tablet TAKE 1 TABLET BY MOUTH AT BEDTIME AS NEEDED FOR SLEEP 11/04/23   Zilphia Hilt, Charyl Coppersmith, MD  aspirin  EC 81 MG tablet Take 81 mg by mouth daily.    [provider]  atorvastatin  (LIPITOR) 40 MG tablet Take 1 tablet (40 mg total) by mouth daily. 10/11/19   Kroeger, Irvine Mantis., PA-C  azelastine  (ASTELIN ) 0.1 % nasal spray Place 1 spray into both nostrils 2 (two) times daily. 10/27/23   Kozlow, Rema Care, MD  Capsaicin 0.1 % CREA APPLY SMALL AMOUNT TO AFFECTED AREA DAILY AS NEEDED 01/31/21   [provider]  Carboxymethylcellulose Sod PF (REFRESH PLUS) 0.5 %  SOLN Apply to eye. 02/17/14   [provider]  carvedilol  (COREG ) 6.25 MG tablet Take 1 tablet (6.25 mg total) by mouth 2 (two) times daily with a meal. 01/18/20   Zilphia Hilt, Charyl Coppersmith, MD  Cholecalciferol  (VITAMIN D3 PO) Take 1 tablet by mouth daily.    [provider]  diclofenac Sodium (VOLTAREN) 1 % GEL Apply topically. 02/02/23   [provider]  empagliflozin (JARDIANCE) 25 MG TABS tablet Take 0.5 tablets by mouth every morning. Pt take 12.5 mg once  a day. 01/31/21   [provider]  Ferrous Sulfate (IRON PO) every morning. 05/02/13   [provider]  ferrous sulfate 325 (65 FE) MG EC tablet Take 325 mg by mouth daily.     [provider]  fluticasone  (FLONASE ) 50 MCG/ACT nasal spray Place 2 sprays into both nostrils daily. 10/27/23   Kozlow, Rema Care, MD  fluticasone  (FLOVENT  HFA) 110 MCG/ACT inhaler During asthma flares do 3 inhalations 3 times a day 09/02/22   Kozlow, Rema Care, MD  fluticasone -salmeterol (WIXELA INHUB) 250-50 MCG/ACT AEPB Inhale 1 puff into the lungs in the morning and at bedtime. 10/27/23   Kozlow, Rema Care, MD  hypromellose (GENTEAL) 0.3 % GEL ophthalmic ointment Apply to eye. 02/17/14   [provider]  isosorbide  mononitrate (IMDUR )  30 MG 24 hr tablet TAKE ONE TABLET BY MOUTH TWICE DAILY 06/30/16   Kwiatkowski, Peter F, MD  levothyroxine  (SYNTHROID ) 100 MCG tablet Take 1 tablet by mouth once daily 08/29/21   Hernandez Acosta, Charyl Coppersmith, MD  Magnesium  400 MG CAPS Take 400 mg by mouth in the morning and at bedtime. 09/02/22   Mealor, Augustus E, MD  nitroGLYCERIN  (NITROSTAT ) 0.4 MG SL tablet Place 1 tablet (0.4 mg total) under the tongue every 5 (five) minutes x 3 doses as needed for chest pain. 10/11/19   Kroeger, Irvine Mantis., PA-C  olopatadine  (PATANOL) 0.1 % ophthalmic solution Place 2 drops into both eyes 2 (two) times daily. 10/27/23   Kozlow, Rema Care, MD  ondansetron  (ZOFRAN ) 8 MG tablet Take 8 mg by mouth every 8 (eight)  hours as needed for nausea or vomiting.     [provider]  OVER THE COUNTER MEDICATION ALA    [provider]  pantoprazole  (PROTONIX ) 40 MG tablet Take 1 tablet (40 mg total) by mouth 2 (two) times daily. 10/27/23   Kozlow, Rema Care, MD  POLYETHYLENE GLYCOL 3350 PO Take by mouth. 02/02/23   [provider]  pregabalin  (LYRICA ) 75 MG capsule Take 1 capsule by mouth twice daily 11/04/23   Zilphia Hilt, Charyl Coppersmith, MD  Spacer/Aero-Holding Idelle Majors DEVI 1 Device by Does not apply route as directed. 03/04/22   Kozlow, Rema Care, MD  tamsulosin  (FLOMAX ) 0.4 MG CAPS capsule TAKE ONE CAPSULE BY MOUTH DAILY 06/26/14   Kwiatkowski, Peter F, MD  Tiotropium Bromide Monohydrate  (SPIRIVA  RESPIMAT) 1.25 MCG/ACT AERS Inhale 2 Inhalations into the lungs every morning. 10/27/23   Kozlow, Rema Care, MD  traZODone  (DESYREL ) 50 MG tablet Take by mouth. 08/04/22   [provider]    Allergies: Meloxicam    Review of Systems  Neurological:  Positive for dizziness.  All other systems reviewed and are negative.   Updated Vital Signs BP (!) 151/79   Pulse 73   Temp 97.8 F (36.6 C) (Oral)   Resp 15   Ht 5' 10 (1.778 m)   Wt 89.8 kg   SpO2 93%   BMI 28.41 kg/m   Physical Exam Vitals and nursing note reviewed.  Constitutional:      Appearance: Normal appearance.  HENT:     Head: Normocephalic and atraumatic.     Right Ear: External ear normal.     Left Ear: External ear normal.     Nose: Nose normal.     Mouth/Throat:     Mouth: Mucous membranes are dry.     Pharynx: Oropharynx is clear.   Eyes:     Extraocular Movements: Extraocular movements intact.     Conjunctiva/sclera: Conjunctivae normal.     Pupils: Pupils are equal, round, and reactive to light.    Cardiovascular:     Rate and Rhythm: Normal rate and regular rhythm.     Pulses: Normal pulses.     Heart sounds: Normal heart sounds.  Pulmonary:     Effort: Pulmonary effort is normal.     Breath sounds:  Normal breath sounds.  Abdominal:     General: Abdomen is flat.     Palpations: Abdomen is soft.   Musculoskeletal:        General: Normal range of motion.     Cervical back: Normal range of motion and neck supple.   Skin:    General: Skin is warm.     Capillary Refill: Capillary refill takes less than  2 seconds.   Neurological:     General: No focal deficit present.     Mental Status: He is alert and oriented to person, place, and time.   Psychiatric:        Mood and Affect: Mood normal.        Behavior: Behavior normal.     (all labs ordered are listed, but only abnormal results are displayed) Labs Reviewed  CBC - Abnormal; Notable for the following components:      Result Value   RBC 3.95 (*)    MCV 102.8 (*)    MCH 34.2 (*)    All other components within normal limits  COMPREHENSIVE METABOLIC PANEL WITH GFR - Abnormal; Notable for the following components:   Glucose, Bld 113 (*)    Creatinine, Ser 1.40 (*)    GFR, Estimated 50 (*)    All other components within normal limits  URINALYSIS, W/ REFLEX TO CULTURE (INFECTION SUSPECTED) - Abnormal; Notable for the following components:   Glucose, UA 500 (*)    All other components within normal limits  ETHANOL  PROTIME-INR  DIFFERENTIAL  URINE DRUG SCREEN    EKG: EKG Interpretation Date/Time:  Thursday November 19 2023 10:23:41 EDT Ventricular Rate:  86 PR Interval:  210 QRS Duration:  107 QT Interval:  359 QTC Calculation: 430 R Axis:   -26  Text Interpretation: Sinus rhythm Inferior infarct, old Consider anterior infarct No significant change since last tracing Confirmed by Sueellen Emery [phone]) on 11/19/2023 10:25:31 AM  Radiology: CT Chest Wo Contrast Result Date: 11/19/2023 CLINICAL DATA:  Two day history of dizziness. Recent diagnosis of pneumonia. EXAM: CT CHEST WITHOUT CONTRAST TECHNIQUE: Multidetector CT imaging of the chest was performed following the standard protocol without IV contrast. RADIATION DOSE  REDUCTION: This exam was performed according to the departmental dose-optimization program which includes automated exposure control, adjustment of the mA and/or kV according to patient size and/or use of iterative reconstruction technique. COMPARISON:  Same day chest radiograph, CTA chest dated 11/12/2020 FINDINGS: Cardiovascular: Normal heart size. No significant pericardial fluid/thickening. Great vessels are normal in course and caliber. Coronary artery calcifications and aortic atherosclerosis. Mediastinum/Nodes: Imaged thyroid  gland without nodules meeting criteria for imaging follow-up by size. Normal esophagus. No pathologically enlarged axillary, supraclavicular, mediastinal, or hilar lymph nodes. Lungs/Pleura: The central airways are patent. Trace secretions within the trachea. Mild diffuse bronchial wall thickening. Bibasilar subsegmental atelectasis. No pneumothorax. No pleural effusion. Upper abdomen: Cholecystectomy. Partially imaged duodenal diverticulum near the head of the pancreas. Partially imaged exophytic left upper pole simple-appearing renal cyst. No specific follow-up imaging recommended. Colonic diverticulosis without acute diverticulitis. Musculoskeletal: No acute or abnormal lytic or blastic osseous lesions. Multilevel degenerative changes of the thoracic spine. IMPRESSION: 1. Mild diffuse bronchial wall thickening, which can be seen in the setting of bronchitis. 2. Aortic Atherosclerosis (ICD10-I70.0). Coronary artery calcifications. Assessment for potential risk factor modification, dietary therapy or pharmacologic therapy may be warranted, if clinically indicated. Electronically Signed   By: Limin  Xu M.D.   On: 11/19/2023 14:57   CT HEAD WO CONTRAST Result Date: 11/19/2023 EXAM: CT HEAD WITHOUT CONTRAST 11/19/2023 11:45:43 AM TECHNIQUE: CT of the head was performed without the administration of intravenous contrast. Automated exposure control, iterative reconstruction, and/or weight  based adjustment of the mA/kV was utilized to reduce the radiation dose to as low as reasonably achievable. COMPARISON: None available. CLINICAL HISTORY: Neuro deficit, acute, stroke suspected. Triage note: Sent from urgent care for evaluation. Two days of dizziness  specially when head movement. Weakness in legs bilaterally. States getting over pneumonia over a week ago. Appears A\T\O x 4. To room by wheel chair. FINDINGS: BRAIN AND VENTRICLES: Mild generalized atrophy and moderate diffuse white matter disease is present bilaterally. There is a remote lacunar infarct present in the anterior right corona radiata on image 16 of series 2. No acute intracranial hemorrhage, mass effect or midline shift. No abnormal extra-axial fluid collection. The gray-white differentiation is maintained. There is no hydrocephalus. ORBITS: Bilateral lens replacements are noted. The globes and orbits are otherwise within normal limits. SINUSES: The visualized paranasal sinuses and mastoid air cells demonstrate no acute abnormality. SOFT TISSUES AND SKULL: No acute abnormality of the visualized skull or soft tissues. VASCULATURE: Atherosclerotic calcifications are present within the cavernous internal carotid arteries and in the mid basilar artery. No hyperdense vessel is present. IMPRESSION: 1. No acute intracranial abnormality. 2. Mild generalized atrophy and moderate diffuse white matter disease bilaterally. 3. Remote  lacunar infarct in the anterior right corona radiata. 4. Atherosclerotic calcifications in the cavernous internal carotid arteries and mid basilar artery. No hyperdense vessel. Electronically signed by: Audree Leas MD 11/19/2023 01:55 PM EDT RP Workstation: [id number]   DG Chest Portable 1 View Result Date: 11/19/2023 CLINICAL DATA:  Dizziness. EXAM: PORTABLE CHEST 1 VIEW COMPARISON:  November 12, 2020. FINDINGS: Stable cardiomediastinal silhouette. Both lungs are clear. The visualized skeletal structures are  unremarkable. IMPRESSION: No active disease. Electronically Signed   By: Rosalene Colon M.D.   On: 11/19/2023 10:50     Procedures   Medications Ordered in the ED  LORazepam  (ATIVAN ) injection 1 mg (has no administration in time range)  sodium chloride  0.9 % bolus 500 mL (0 mLs Intravenous Stopped 11/19/23 1208)  sodium chloride  0.9 % bolus 500 mL (500 mLs Intravenous New Bag/Given 11/19/23 1410)                                    Medical Decision Making Amount and/or Complexity of Data Reviewed Labs: ordered. Radiology: ordered.  Risk Prescription drug management.   This patient presents to the ED for concern of dizziness, this involves an extensive number of treatment options, and is a complaint that carries with it a high risk of complications and morbidity.  The differential diagnosis includes cva, dehydration, infection, electrolyte abn   Co morbidities that complicate the patient evaluation  asthma, CAD, GERD, HLD, CKD, HTN, and anemia   Additional history obtained:  Additional history obtained from epic chart review External records from outside source obtained and reviewed including wife   Lab Tests:  I Ordered, and personally interpreted labs.  The pertinent results include:  cbc nl, cmp nl other than cr sl elevated at 1.4 (stable); inr nl; etoh neg; ua nl   Imaging Studies ordered:  I ordered imaging studies including cxr and ct head  I independently visualized and interpreted imaging which showed  CXR: No active disease.  CT head: . No acute intracranial abnormality.  2. Mild generalized atrophy and moderate diffuse white matter disease  bilaterally.  3. Remote  lacunar infarct in the anterior right corona radiata.  4. Atherosclerotic calcifications in the cavernous internal carotid arteries  and mid basilar artery. No hyperdense vessel.  CT chest: IMPRESSION:  1. Mild diffuse bronchial wall thickening, which can be seen in the  setting of  bronchitis.  2. Aortic Atherosclerosis (ICD10-I70.0). Coronary artery  calcifications.  Assessment for potential risk factor modification,  dietary therapy or pharmacologic therapy may be warranted, if  clinically indicated.   I agree with the radiologist interpretation   Cardiac Monitoring:  The patient was maintained on a cardiac monitor.  I personally viewed and interpreted the cardiac monitored which showed an underlying rhythm of: nsr   Medicines ordered and prescription drug management:  I ordered medication including ivfs  for sx  Reevaluation of the patient after these medicines showed that the patient improved I have reviewed the patients home medicines and have made adjustments as needed   Test Considered:  Ct/mri   Critical Interventions:  fluids   Problem List / ED Course:  Bronchitis:  pt failed doxy.  He is still having cough, so I will start him on augmentin  and prednisone .   Dizziness:  likely due to dehydration, but he's at high risk for CVA.  MRI pending.    Reevaluation:  After the interventions noted above, I reevaluated the patient and found that they have :improved   Social Determinants of Health:  Lives at home   Dispostion:  Pending at shift change     Final diagnoses:  Bronchitis  Dehydration    ED Discharge Orders          Ordered    amoxicillin -clavulanate (AUGMENTIN ) 875-125 MG tablet  Every 12 hours        11/19/23 1510    predniSONE  (DELTASONE ) 50 MG tablet  Daily with breakfast        11/19/23 1510               Sueellen Emery, MD 11/19/23 1512

## 2023-11-19 NOTE — ED Provider Notes (Signed)
 Patient care was taken over from Dr. Scarlette Currier.  Patient presented with cough and congestion.  Has been treated for bronchitis.  Has some ongoing dizziness.  It seems to be more of a lightheadedness.  No focal neurologic deficits.  MRI does not show any evidence of acute stroke.  Labs are nonconcerning.  CT chest is consistent with bronchitis.  Patient was given IV fluids and overall is feeling better.  He was discharged home in good condition.  Discharge instructions and medications given per Dr. Scarlette Currier.   Hershel Los, MD 11/19/23 [phone]

## 2023-11-19 NOTE — ED Triage Notes (Signed)
 Sent from urgent care for evaluation.  Two days of dizziness specially when head movement.  Weakness in legs bilaterally.  States getting over pneumonia over a week ago.  Appears A&O x 4.  To room by wheel chair

## 2023-11-19 NOTE — ED Notes (Signed)
 Patient transported to CT

## 2023-11-19 NOTE — ED Notes (Signed)
 Asked pt if he can urinate pt declined. Said he'll call out if needed

## 2023-11-23 ENCOUNTER — Telehealth: Payer: Self-pay | Admitting: *Deleted

## 2023-11-23 DIAGNOSIS — K219 Gastro-esophageal reflux disease without esophagitis: Secondary | ICD-10-CM | POA: Diagnosis not present

## 2023-11-23 DIAGNOSIS — F320 Major depressive disorder, single episode, mild: Secondary | ICD-10-CM | POA: Diagnosis not present

## 2023-11-23 DIAGNOSIS — F419 Anxiety disorder, unspecified: Secondary | ICD-10-CM | POA: Diagnosis not present

## 2023-11-23 NOTE — Transitions of Care (Post Inpatient/ED Visit) (Signed)
   11/23/2023  Name: Richard Ho MRN: [id number] DOB: Oct 19, 1940  Today's TOC FU Call Status:    Attempted to reach the patient regarding the most recent Inpatient/ED visit.  Follow Up Plan: Additional outreach attempts will be made to reach the patient to complete the Transitions of Care (Post Inpatient/ED visit) call.   Signature Nicolina Barrios CMA

## 2023-12-08 DIAGNOSIS — E119 Type 2 diabetes mellitus without complications: Secondary | ICD-10-CM | POA: Diagnosis not present

## 2023-12-09 ENCOUNTER — Encounter: Payer: Self-pay | Admitting: Internal Medicine

## 2023-12-09 ENCOUNTER — Ambulatory Visit: Admitting: Internal Medicine

## 2023-12-09 VITALS — BP 110/80 | HR 90 | Temp 97.5°F | Wt 199.8 lb

## 2023-12-09 DIAGNOSIS — I10 Essential (primary) hypertension: Secondary | ICD-10-CM

## 2023-12-09 DIAGNOSIS — Z09 Encounter for follow-up examination after completed treatment for conditions other than malignant neoplasm: Secondary | ICD-10-CM | POA: Diagnosis not present

## 2023-12-09 DIAGNOSIS — E785 Hyperlipidemia, unspecified: Secondary | ICD-10-CM | POA: Diagnosis not present

## 2023-12-09 DIAGNOSIS — E1121 Type 2 diabetes mellitus with diabetic nephropathy: Secondary | ICD-10-CM

## 2023-12-09 DIAGNOSIS — J302 Other seasonal allergic rhinitis: Secondary | ICD-10-CM

## 2023-12-09 LAB — POCT GLYCOSYLATED HEMOGLOBIN (HGB A1C): Hemoglobin A1C: 6.8 % — AB (ref 4.0–5.6)

## 2023-12-09 NOTE — Progress Notes (Signed)
 Established Patient Office Visit     CC/Reason for Visit: ED follow-up  HPI: Richard Ho is a 83 y.o. male who is coming in today for the above mentioned reasons.  He was seen in the emergency department on June 12 for dizziness and weakness.  He had an extensive workup including CT and MRI of the brain that were negative and a CT of the chest that showed mild bronchitis.  He was treated with antibiotics and is feeling improved.  He has pretty severe seasonal allergies.  He stopped taking his Allegra  right around March.   Past Medical/Surgical History: Past Medical History:  Diagnosis Date   Anemia    Asthma    Cataract    CKD (chronic kidney disease), stage III (HCC)    Coronary artery disease 1991   a) Angioplasty LAD 1991 [place] b) MI in 1995 at  [facility] in [place] , med Rx c) Cath 10/2019 for NSTEMI - see report -> med rx.   Diabetes mellitus    GERD (gastroesophageal reflux disease)    Hearing loss    HTN (hypertension)    Hyperlipidemia    Hypothyroidism    Myocardial infarct (HCC)    Nephrolithiasis    Osteopenia    Premature atrial contractions    PVC's (premature ventricular contractions)    Snake bite     Past Surgical History:  Procedure Laterality Date   ANGIOPLASTY  1991   CARDIAC CATHETERIZATION  2005   Moderate LCx and LAD disease and severe diffuse RCA disease   CARDIAC CATHETERIZATION  01/2012   normal left main, 50% pLAD stenosis, dLAD mild luminal irregularities, D1 30-40% stenosis at take off; subtotally occluded/severely diseased OM1, mid 80% stenosis in small OM2, long prox and mid RCA stenoses ranging from 60-95%, severely diseased PDA, distal PLSA occluded filling via L-R collateral; normal LV function; inferior lateral basal akinesis.   CHOLECYSTECTOMY  2012   CORONARY PRESSURE/FFR STUDY N/A 10/10/2019   Procedure: INTRAVASCULAR PRESSURE WIRE/FFR STUDY;  Surgeon: Court Dorn PARAS, MD;  Location: [facility];  Service:  Cardiovascular;  Laterality: N/A;   EYE SURGERY  2010   cataract - right, has left done too   INGUINAL HERNIA REPAIR     LEFT HEART CATH AND CORONARY ANGIOGRAPHY N/A 10/10/2019   Procedure: LEFT HEART CATH AND CORONARY ANGIOGRAPHY;  Surgeon: Court Dorn PARAS, MD;  Location: [facility];  Service: Cardiovascular;  Laterality: N/A;   LEFT HEART CATHETERIZATION WITH CORONARY ANGIOGRAM N/A 01/28/2012   Procedure: LEFT HEART CATHETERIZATION WITH CORONARY ANGIOGRAM;  Surgeon: Deatrice DELENA Cage, MD;  Location: [facility] CATH [facility];  Service: Cardiovascular;  Laterality: N/A;   LEFT HEART CATHETERIZATION WITH CORONARY ANGIOGRAM N/A 03/17/2014   Procedure: LEFT HEART CATHETERIZATION WITH CORONARY ANGIOGRAM;  Surgeon: Lonni JONETTA Cash, MD;  Location: [facility] CATH LAB;  Service: Cardiovascular;  Laterality: N/A;   ORIF TIBIA & FIBULA FRACTURES     PTCA     SHOULDER ARTHROSCOPY WITH DISTAL CLAVICLE RESECTION Left 12/16/2018   Procedure: LEFT SHOULDER ARTHROSCOPY WITH DISTAL CLAVICLE RESECTION;  Surgeon: Cristy Bonner DASEN, MD;  Location: [facility];  Service: Orthopedics;  Laterality: Left;   SHOULDER ARTHROSCOPY WITH ROTATOR CUFF REPAIR AND SUBACROMIAL DECOMPRESSION Left 12/16/2018   Procedure: LEFT SHOULDER ARTHROSCOPY WITH ROTATOR CUFF REPAIR AND SUBACROMIAL DECOMPRESSION, DEBRIDEMENT;  Surgeon: Cristy Bonner DASEN, MD;  Location: [facility];  Service: Orthopedics;  Laterality: Left;   SHOULDER ARTHROSCOPY WITH SUBACROMIAL  DECOMPRESSION AND BICEP TENDON REPAIR Left 12/16/2018   Procedure: SHOULDER ARTHROSCOPY WITH SUBACROMIAL DECOMPRESSION AND BICEP TENDON REPAIR;  Surgeon: Cristy Bonner DASEN, MD;  Location: [facility];  Service: Orthopedics;  Laterality: Left;   VASECTOMY      Social History:  reports that he has never smoked. He has never used smokeless tobacco. He reports current alcohol use. He reports that he does not use drugs.  Allergies: Allergies  Allergen Reactions    Meloxicam Other (See Comments) and Anxiety    jumping out of my skin Pt feels anxiety attacks when on this medicine    Family History:  Family History  Problem Relation Age of Onset   Asthma Mother    Allergies Sister    Coronary artery disease Other    Colon cancer Neg Hx    Esophageal cancer Neg Hx    Stomach cancer Neg Hx    Rectal cancer Neg Hx      Current Outpatient Medications:    Albuterol  (PROVENTIL  IN), Inhale into the lungs., Disp: , Rfl:    albuterol  (VENTOLIN  HFA) 108 (90 Base) MCG/ACT inhaler, Inhale 2 puffs into the lungs every 4 (four) hours as needed for wheezing or shortness of breath., Disp: 18 g, Rfl: 1   ALPRAZolam  (XANAX ) 0.25 MG tablet, TAKE 1 TABLET BY MOUTH AT BEDTIME AS NEEDED FOR SLEEP, Disp: 30 tablet, Rfl: 0   amoxicillin -clavulanate (AUGMENTIN ) 875-125 MG tablet, Take 1 tablet by mouth every 12 (twelve) hours., Disp: 14 tablet, Rfl: 0   aspirin  EC 81 MG tablet, Take 81 mg by mouth daily., Disp: , Rfl:    atorvastatin  (LIPITOR) 40 MG tablet, Take 1 tablet (40 mg total) by mouth daily., Disp: 90 tablet, Rfl: 3   azelastine  (ASTELIN ) 0.1 % nasal spray, Place 1 spray into both nostrils 2 (two) times daily., Disp: 90 mL, Rfl: 1   Capsaicin 0.1 % CREA, APPLY SMALL AMOUNT TO AFFECTED AREA DAILY AS NEEDED, Disp: , Rfl:    Carboxymethylcellulose Sod PF (REFRESH PLUS) 0.5 % SOLN, Apply to eye., Disp: , Rfl:    carvedilol  (COREG ) 6.25 MG tablet, Take 1 tablet (6.25 mg total) by mouth 2 (two) times daily with a meal., Disp: 180 tablet, Rfl: 1   Cholecalciferol  (VITAMIN D3 PO), Take 1 tablet by mouth daily., Disp: , Rfl:    diclofenac Sodium (VOLTAREN) 1 % GEL, Apply topically., Disp: , Rfl:    empagliflozin (JARDIANCE) 25 MG TABS tablet, Take 0.5 tablets by mouth every morning. Pt take 12.5 mg once  a day., Disp: , Rfl:    Ferrous Sulfate (IRON PO), every morning., Disp: , Rfl:    ferrous sulfate 325 (65 FE) MG EC tablet, Take 325 mg by mouth daily. , Disp: , Rfl:     fluticasone  (FLONASE ) 50 MCG/ACT nasal spray, Place 2 sprays into both nostrils daily., Disp: 1 g, Rfl: 5   fluticasone  (FLOVENT  HFA) 110 MCG/ACT inhaler, During asthma flares do 3 inhalations 3 times a day, Disp: 1 each, Rfl: 3   fluticasone -salmeterol (WIXELA INHUB) 250-50 MCG/ACT AEPB, Inhale 1 puff into the lungs in the morning and at bedtime., Disp: 180 each, Rfl: 1   hypromellose (GENTEAL) 0.3 % GEL ophthalmic ointment, Apply to eye., Disp: , Rfl:    isosorbide  mononitrate (IMDUR ) 30 MG 24 hr tablet, TAKE ONE TABLET BY MOUTH TWICE DAILY, Disp: 180 tablet, Rfl: 3   levothyroxine  (SYNTHROID ) 100 MCG tablet, Take 1 tablet by mouth once daily, Disp: 90 tablet, Rfl:  1   Magnesium  400 MG CAPS, Take 400 mg by mouth in the morning and at bedtime., Disp: , Rfl:    nitroGLYCERIN  (NITROSTAT ) 0.4 MG SL tablet, Place 1 tablet (0.4 mg total) under the tongue every 5 (five) minutes x 3 doses as needed for chest pain., Disp: 25 tablet, Rfl: 3   olopatadine  (PATANOL) 0.1 % ophthalmic solution, Place 2 drops into both eyes 2 (two) times daily., Disp: 15 mL, Rfl: 1   ondansetron  (ZOFRAN ) 8 MG tablet, Take 8 mg by mouth every 8 (eight) hours as needed for nausea or vomiting. , Disp: , Rfl:    OVER THE COUNTER MEDICATION, ALA, Disp: , Rfl:    pantoprazole  (PROTONIX ) 40 MG tablet, Take 1 tablet (40 mg total) by mouth 2 (two) times daily., Disp: 180 tablet, Rfl: 1   POLYETHYLENE GLYCOL 3350 PO, Take by mouth., Disp: , Rfl:    predniSONE  (DELTASONE ) 50 MG tablet, Take 1 tablet (50 mg total) by mouth daily with breakfast., Disp: 5 tablet, Rfl: 0   pregabalin  (LYRICA ) 75 MG capsule, Take 1 capsule by mouth twice daily, Disp: 180 capsule, Rfl: 0   Spacer/Aero-Holding Chambers DEVI, 1 Device by Does not apply route as directed., Disp: 1 each, Rfl: 1   tamsulosin  (FLOMAX ) 0.4 MG CAPS capsule, TAKE ONE CAPSULE BY MOUTH DAILY, Disp: 30 capsule, Rfl: 5   Tiotropium Bromide Monohydrate  (SPIRIVA  RESPIMAT) 1.25 MCG/ACT  AERS, Inhale 2 Inhalations into the lungs every morning., Disp: 12 g, Rfl: 1   traZODone  (DESYREL ) 50 MG tablet, Take by mouth., Disp: , Rfl:   Review of Systems:  Negative unless indicated in HPI.   Physical Exam: Vitals:   12/09/23 0808  BP: 110/80  Pulse: 90  Temp: (!) 97.5 F (36.4 C)  TempSrc: Oral  SpO2: 95%  Weight: 199 lb 12.8 oz (90.6 kg)    Body mass index is 28.67 kg/m.   Physical Exam Vitals reviewed.  Constitutional:      Appearance: Normal appearance. He is obese.  HENT:     Head: Normocephalic and atraumatic.  Eyes:     Conjunctiva/sclera: Conjunctivae normal.  Cardiovascular:     Rate and Rhythm: Normal rate and regular rhythm.  Pulmonary:     Effort: Pulmonary effort is normal.     Breath sounds: Normal breath sounds.  Skin:    General: Skin is warm and dry.  Neurological:     General: No focal deficit present.     Mental Status: He is alert and oriented to person, place, and time.  Psychiatric:        Mood and Affect: Mood normal.        Behavior: Behavior normal.        Thought Content: Thought content normal.        Judgment: Judgment normal.      Impression and Plan:  Type 2 diabetes mellitus with diabetic nephropathy, without long-term current use of insulin  (HCC) -     POCT glycosylated hemoglobin (Hb A1C)  Hospital discharge follow-up  Essential hypertension  Dyslipidemia  Seasonal allergies   - Hospital charts reviewed in detail.  ED workup was negative with the exception of mild bronchitis.  He feels improved after antibiotic therapy.  Advised to resume his antihistamine. - Blood pressure is well-controlled on current. - A1c of 6.8 demonstrates good diabetic control.  Time spent:30 minutes reviewing chart, interviewing and examining patient and formulating plan of care.     Tully Theophilus Andrews, MD [facility]

## 2024-01-08 DIAGNOSIS — E119 Type 2 diabetes mellitus without complications: Secondary | ICD-10-CM | POA: Diagnosis not present

## 2024-01-21 DIAGNOSIS — N401 Enlarged prostate with lower urinary tract symptoms: Secondary | ICD-10-CM | POA: Diagnosis not present

## 2024-01-21 DIAGNOSIS — R3915 Urgency of urination: Secondary | ICD-10-CM | POA: Diagnosis not present

## 2024-01-30 ENCOUNTER — Other Ambulatory Visit: Payer: Self-pay | Admitting: Internal Medicine

## 2024-01-30 DIAGNOSIS — G4700 Insomnia, unspecified: Secondary | ICD-10-CM

## 2024-02-08 DIAGNOSIS — E119 Type 2 diabetes mellitus without complications: Secondary | ICD-10-CM | POA: Diagnosis not present

## 2024-02-15 ENCOUNTER — Other Ambulatory Visit: Payer: Self-pay | Admitting: Internal Medicine

## 2024-02-15 DIAGNOSIS — E1142 Type 2 diabetes mellitus with diabetic polyneuropathy: Secondary | ICD-10-CM

## 2024-02-24 ENCOUNTER — Ambulatory Visit: Payer: Medicare Other | Admitting: Internal Medicine

## 2024-02-24 ENCOUNTER — Encounter: Payer: Self-pay | Admitting: Internal Medicine

## 2024-02-24 VITALS — BP 120/78 | HR 73 | Temp 97.5°F | Ht 70.75 in | Wt 203.3 lb

## 2024-02-24 DIAGNOSIS — E1121 Type 2 diabetes mellitus with diabetic nephropathy: Secondary | ICD-10-CM

## 2024-02-24 DIAGNOSIS — J4520 Mild intermittent asthma, uncomplicated: Secondary | ICD-10-CM

## 2024-02-24 DIAGNOSIS — Z0000 Encounter for general adult medical examination without abnormal findings: Secondary | ICD-10-CM

## 2024-02-24 DIAGNOSIS — Z23 Encounter for immunization: Secondary | ICD-10-CM

## 2024-02-24 DIAGNOSIS — R972 Elevated prostate specific antigen [PSA]: Secondary | ICD-10-CM

## 2024-02-24 DIAGNOSIS — I10 Essential (primary) hypertension: Secondary | ICD-10-CM

## 2024-02-24 DIAGNOSIS — E785 Hyperlipidemia, unspecified: Secondary | ICD-10-CM

## 2024-02-24 DIAGNOSIS — Z794 Long term (current) use of insulin: Secondary | ICD-10-CM | POA: Diagnosis not present

## 2024-02-24 DIAGNOSIS — K219 Gastro-esophageal reflux disease without esophagitis: Secondary | ICD-10-CM

## 2024-02-24 DIAGNOSIS — E1142 Type 2 diabetes mellitus with diabetic polyneuropathy: Secondary | ICD-10-CM

## 2024-02-24 DIAGNOSIS — Z1283 Encounter for screening for malignant neoplasm of skin: Secondary | ICD-10-CM

## 2024-02-24 DIAGNOSIS — E039 Hypothyroidism, unspecified: Secondary | ICD-10-CM

## 2024-02-24 NOTE — Addendum Note (Signed)
 Addended by: KATHRYNE MILLMAN B on: 02/24/2024 09:36 AM   Modules accepted: Orders

## 2024-02-24 NOTE — Progress Notes (Signed)
 Established Patient Office Visit     CC/Reason for Visit: Annual preventive exam and subsequent Medicare wellness visit  HPI: Richard Ho is a 83 y.o. male who is coming in today for the above mentioned reasons. Past Medical History is significant for: Type 2 diabetes with peripheral neuropathy, hypertension, hypothyroidism, asthma, history of coronary artery disease.  He gets most of his care through the [facility].  Has routine eye and dental care.  Is requesting routine dermatology appointment.  Is due for flu, COVID, pneumonia vaccines.   Past Medical/Surgical History: Past Medical History:  Diagnosis Date   Anemia    Asthma    Cataract    CKD (chronic kidney disease), stage III (HCC)    Coronary artery disease 1991   a) Angioplasty LAD 1991 [place] b) MI in 1995 at  [facility] in [place] , med Rx c) Cath 10/2019 for NSTEMI - see report -> med rx.   Diabetes mellitus    GERD (gastroesophageal reflux disease)    Hearing loss    HTN (hypertension)    Hyperlipidemia    Hypothyroidism    Myocardial infarct (HCC)    Nephrolithiasis    Osteopenia    Premature atrial contractions    PVC's (premature ventricular contractions)    Snake bite     Past Surgical History:  Procedure Laterality Date   ANGIOPLASTY  1991   CARDIAC CATHETERIZATION  2005   Moderate LCx and LAD disease and severe diffuse RCA disease   CARDIAC CATHETERIZATION  01/2012   normal left main, 50% pLAD stenosis, dLAD mild luminal irregularities, D1 30-40% stenosis at take off; subtotally occluded/severely diseased OM1, mid 80% stenosis in small OM2, long prox and mid RCA stenoses ranging from 60-95%, severely diseased PDA, distal PLSA occluded filling via L-R collateral; normal LV function; inferior lateral basal akinesis.   CHOLECYSTECTOMY  2012   CORONARY PRESSURE/FFR STUDY N/A 10/10/2019   Procedure: INTRAVASCULAR PRESSURE WIRE/FFR STUDY;  Surgeon: Court Dorn PARAS, MD;  Location: [facility];   Service: Cardiovascular;  Laterality: N/A;   EYE SURGERY  2010   cataract - right, has left done too   INGUINAL HERNIA REPAIR     LEFT HEART CATH AND CORONARY ANGIOGRAPHY N/A 10/10/2019   Procedure: LEFT HEART CATH AND CORONARY ANGIOGRAPHY;  Surgeon: Court Dorn PARAS, MD;  Location: [facility];  Service: Cardiovascular;  Laterality: N/A;   LEFT HEART CATHETERIZATION WITH CORONARY ANGIOGRAM N/A 01/28/2012   Procedure: LEFT HEART CATHETERIZATION WITH CORONARY ANGIOGRAM;  Surgeon: Deatrice DELENA Cage, MD;  Location: [facility] CATH [facility];  Service: Cardiovascular;  Laterality: N/A;   LEFT HEART CATHETERIZATION WITH CORONARY ANGIOGRAM N/A 03/17/2014   Procedure: LEFT HEART CATHETERIZATION WITH CORONARY ANGIOGRAM;  Surgeon: Lonni JONETTA Cash, MD;  Location: [facility] CATH LAB;  Service: Cardiovascular;  Laterality: N/A;   ORIF TIBIA & FIBULA FRACTURES     PTCA     SHOULDER ARTHROSCOPY WITH DISTAL CLAVICLE RESECTION Left 12/16/2018   Procedure: LEFT SHOULDER ARTHROSCOPY WITH DISTAL CLAVICLE RESECTION;  Surgeon: Cristy Bonner DASEN, MD;  Location: [facility];  Service: Orthopedics;  Laterality: Left;   SHOULDER ARTHROSCOPY WITH ROTATOR CUFF REPAIR AND SUBACROMIAL DECOMPRESSION Left 12/16/2018   Procedure: LEFT SHOULDER ARTHROSCOPY WITH ROTATOR CUFF REPAIR AND SUBACROMIAL DECOMPRESSION, DEBRIDEMENT;  Surgeon: Cristy Bonner DASEN, MD;  Location: [facility];  Service: Orthopedics;  Laterality: Left;   SHOULDER ARTHROSCOPY WITH SUBACROMIAL DECOMPRESSION AND BICEP TENDON REPAIR Left 12/16/2018   Procedure:  SHOULDER ARTHROSCOPY WITH SUBACROMIAL DECOMPRESSION AND BICEP TENDON REPAIR;  Surgeon: Cristy Bonner DASEN, MD;  Location: [facility];  Service: Orthopedics;  Laterality: Left;   VASECTOMY      Social History:  reports that he has never smoked. He has never used smokeless tobacco. He reports current alcohol use. He reports that he does not use drugs.  Allergies: Allergies  Allergen  Reactions   Meloxicam Other (See Comments) and Anxiety    jumping out of my skin Pt feels anxiety attacks when on this medicine    Family History:  Family History  Problem Relation Age of Onset   Asthma Mother    Allergies Sister    Coronary artery disease Other    Colon cancer Neg Hx    Esophageal cancer Neg Hx    Stomach cancer Neg Hx    Rectal cancer Neg Hx      Current Outpatient Medications:    Albuterol  (PROVENTIL  IN), Inhale into the lungs., Disp: , Rfl:    albuterol  (VENTOLIN  HFA) 108 (90 Base) MCG/ACT inhaler, Inhale 2 puffs into the lungs every 4 (four) hours as needed for wheezing or shortness of breath., Disp: 18 g, Rfl: 1   ALPRAZolam  (XANAX ) 0.25 MG tablet, TAKE 1 TABLET BY MOUTH AT BEDTIME AS NEEDED FOR SLEEP, Disp: 30 tablet, Rfl: 0   amoxicillin -clavulanate (AUGMENTIN ) 875-125 MG tablet, Take 1 tablet by mouth every 12 (twelve) hours., Disp: 14 tablet, Rfl: 0   aspirin  EC 81 MG tablet, Take 81 mg by mouth daily., Disp: , Rfl:    atorvastatin  (LIPITOR) 40 MG tablet, Take 1 tablet (40 mg total) by mouth daily., Disp: 90 tablet, Rfl: 3   azelastine  (ASTELIN ) 0.1 % nasal spray, Place 1 spray into both nostrils 2 (two) times daily., Disp: 90 mL, Rfl: 1   Capsaicin 0.1 % CREA, APPLY SMALL AMOUNT TO AFFECTED AREA DAILY AS NEEDED, Disp: , Rfl:    Carboxymethylcellulose Sod PF (REFRESH PLUS) 0.5 % SOLN, Apply to eye., Disp: , Rfl:    carvedilol  (COREG ) 6.25 MG tablet, Take 1 tablet (6.25 mg total) by mouth 2 (two) times daily with a meal., Disp: 180 tablet, Rfl: 1   Cholecalciferol  (VITAMIN D3 PO), Take 1 tablet by mouth daily., Disp: , Rfl:    diclofenac Sodium (VOLTAREN) 1 % GEL, Apply topically., Disp: , Rfl:    empagliflozin (JARDIANCE) 25 MG TABS tablet, Take 0.5 tablets by mouth every morning. Pt take 12.5 mg once  a day., Disp: , Rfl:    Ferrous Sulfate (IRON PO), every morning., Disp: , Rfl:    ferrous sulfate 325 (65 FE) MG EC tablet, Take 325 mg by mouth daily. ,  Disp: , Rfl:    fluticasone  (FLONASE ) 50 MCG/ACT nasal spray, Place 2 sprays into both nostrils daily., Disp: 1 g, Rfl: 5   fluticasone  (FLOVENT  HFA) 110 MCG/ACT inhaler, During asthma flares do 3 inhalations 3 times a day, Disp: 1 each, Rfl: 3   fluticasone -salmeterol (WIXELA INHUB) 250-50 MCG/ACT AEPB, Inhale 1 puff into the lungs in the morning and at bedtime., Disp: 180 each, Rfl: 1   hypromellose (GENTEAL) 0.3 % GEL ophthalmic ointment, Apply to eye., Disp: , Rfl:    isosorbide  mononitrate (IMDUR ) 30 MG 24 hr tablet, TAKE ONE TABLET BY MOUTH TWICE DAILY, Disp: 180 tablet, Rfl: 3   levothyroxine  (SYNTHROID ) 100 MCG tablet, Take 1 tablet by mouth once daily, Disp: 90 tablet, Rfl: 1   Magnesium  400 MG CAPS, Take 400 mg  by mouth in the morning and at bedtime., Disp: , Rfl:    nitroGLYCERIN  (NITROSTAT ) 0.4 MG SL tablet, Place 1 tablet (0.4 mg total) under the tongue every 5 (five) minutes x 3 doses as needed for chest pain., Disp: 25 tablet, Rfl: 3   olopatadine  (PATANOL) 0.1 % ophthalmic solution, Place 2 drops into both eyes 2 (two) times daily., Disp: 15 mL, Rfl: 1   ondansetron  (ZOFRAN ) 8 MG tablet, Take 8 mg by mouth every 8 (eight) hours as needed for nausea or vomiting. , Disp: , Rfl:    OVER THE COUNTER MEDICATION, ALA, Disp: , Rfl:    pantoprazole  (PROTONIX ) 40 MG tablet, Take 1 tablet (40 mg total) by mouth 2 (two) times daily., Disp: 180 tablet, Rfl: 1   POLYETHYLENE GLYCOL 3350 PO, Take by mouth., Disp: , Rfl:    predniSONE  (DELTASONE ) 50 MG tablet, Take 1 tablet (50 mg total) by mouth daily with breakfast., Disp: 5 tablet, Rfl: 0   pregabalin  (LYRICA ) 75 MG capsule, Take 1 capsule by mouth twice daily, Disp: 180 capsule, Rfl: 0   tamsulosin  (FLOMAX ) 0.4 MG CAPS capsule, TAKE ONE CAPSULE BY MOUTH DAILY, Disp: 30 capsule, Rfl: 5   Tiotropium Bromide Monohydrate  (SPIRIVA  RESPIMAT) 1.25 MCG/ACT AERS, Inhale 2 Inhalations into the lungs every morning., Disp: 12 g, Rfl: 1   traZODone   (DESYREL ) 50 MG tablet, Take by mouth., Disp: , Rfl:    Spacer/Aero-Holding Chambers DEVI, 1 Device by Does not apply route as directed., Disp: 1 each, Rfl: 1  Review of Systems:  Negative unless indicated in HPI.   Physical Exam: Vitals:   02/24/24 0802  BP: 120/78  Pulse: 73  Temp: (!) 97.5 F (36.4 C)  TempSrc: Oral  SpO2: 96%  Weight: 203 lb 4.8 oz (92.2 kg)  Height: 5' 10.75 (1.797 m)    Body mass index is 28.56 kg/m.   Physical Exam Vitals reviewed.  Constitutional:      General: He is not in acute distress.    Appearance: Normal appearance. He is not ill-appearing, toxic-appearing or diaphoretic.  HENT:     Head: Normocephalic.     Right Ear: Tympanic membrane, ear canal and external ear normal. There is no impacted cerumen.     Left Ear: Tympanic membrane, ear canal and external ear normal. There is no impacted cerumen.     Nose: Nose normal.     Mouth/Throat:     Mouth: Mucous membranes are moist.     Pharynx: Oropharynx is clear. No oropharyngeal exudate or posterior oropharyngeal erythema.  Eyes:     General: No scleral icterus.       Right eye: No discharge.        Left eye: No discharge.     Conjunctiva/sclera: Conjunctivae normal.     Pupils: Pupils are equal, round, and reactive to light.  Neck:     Vascular: No carotid bruit.  Cardiovascular:     Rate and Rhythm: Normal rate and regular rhythm.     Pulses: Normal pulses.     Heart sounds: Normal heart sounds.  Pulmonary:     Effort: Pulmonary effort is normal. No respiratory distress.     Breath sounds: Normal breath sounds.  Abdominal:     General: Abdomen is flat. Bowel sounds are normal.     Palpations: Abdomen is soft.  Musculoskeletal:        General: Normal range of motion.     Cervical back: Normal range of motion.  Skin:  General: Skin is warm and dry.  Neurological:     General: No focal deficit present.     Mental Status: He is alert and oriented to person, place, and time.  Mental status is at baseline.  Psychiatric:        Mood and Affect: Mood normal.        Behavior: Behavior normal.        Thought Content: Thought content normal.        Judgment: Judgment normal.    Subsequent Medicare wellness visit   1. Risk factors, based on past  M,S,F - Cardiac Risk Factors include: advanced age (>50men, >40 women);diabetes mellitus   2.  Physical activities: Dietary issues and exercise activities discussed:      3.  Depression/mood:  Flowsheet Row Office Visit from 12/09/2023 in [facility] Total Score 3     4.  ADL's:    02/24/2024    8:01 AM  In your present state of health, do you have any difficulty performing the following activities:  Hearing? 1  Vision? 1  Difficulty concentrating or making decisions? 0  Walking or climbing stairs? 0  Dressing or bathing? 0  Doing errands, shopping? 0  Preparing Food and eating ? N  Using the Toilet? N  In the past six months, have you accidently leaked urine? N  Do you have problems with loss of bowel control? N  Managing your Medications? N  Managing your Finances? N  Housekeeping or managing your Housekeeping? N     5.  Fall risk:     06/17/2022    5:34 PM 06/18/2022    3:22 PM 02/19/2023    4:54 PM 12/09/2023    9:03 AM 02/24/2024    8:04 AM  Fall Risk  Falls in the past year? 0 1 1 0 0  Was there an injury with Fall?  0 0 0 0  Fall Risk Category Calculator  1 2 0 0  Fall Risk Category (Retired)  Low      (RETIRED) Patient Fall Risk Level  Low fall risk      Patient at Risk for Falls Due to  No Fall Risks     Fall risk Follow up  Falls evaluation completed  Falls evaluation completed Falls evaluation completed Falls evaluation completed     Data saved with a previous flowsheet row definition     6.  Home safety: No problems identified   7.  Height weight, and visual acuity: height and weight as above, vision/hearing: No results found.   8.   Counseling: Counseling given: Not Answered    9. Lab orders based on risk factors: Laboratory update will be reviewed   10. Cognitive assessment:        02/24/2024    8:06 AM 02/19/2023    4:55 PM  6CIT Screen  What Year? 0 points 0 points  What month? 0 points 0 points  What time? 0 points 0 points  Count back from 20 0 points 0 points  Months in reverse 0 points 0 points  Repeat phrase 0 points 0 points  Total Score 0 points 0 points     11. Screening: Patient provided with a written and personalized 5-10 year screening schedule in the AVS. Health Maintenance  Topic Date Due   Yearly kidney health urinalysis for diabetes  Never done   Flu Shot  01/08/2024   COVID-19 Vaccine (7 - 2025-26 season) 02/08/2024  Hemoglobin A1C  06/10/2024   Eye exam for diabetics  10/25/2024   Yearly kidney function blood test for diabetes  11/18/2024   Complete foot exam   02/23/2025   Medicare Annual Wellness Visit  02/23/2025   DTaP/Tdap/Td vaccine (5 - Td or Tdap) 11/12/2031   Pneumococcal Vaccine for age over 36  Completed   Zoster (Shingles) Vaccine  Completed   HPV Vaccine  Aged Out   Meningitis B Vaccine  Aged Out    9. Provider List Update: Patient Care Team    Relationship Specialty Notifications Start End  Theophilus Andrews, Tully GRADE, MD PCP - General Internal Medicine  07/14/18   Verlin Lonni BIRCH, MD PCP - Cardiology Cardiology Admissions 07/20/17   Mealor, Eulas BRAVO, MD PCP - Electrophysiology Cardiology  08/29/22      13. Advance Directives: Does Patient Have a Medical Advance Directive?: Yes Type of Advance Directive: Living will, Out of facility DNR (pink MOST or yellow form), Healthcare Power of Attorney Does patient want to make changes to medical advance directive?: No - Patient declined Copy of Healthcare Power of Attorney in Chart?: No - copy requested  14. Opioids: Patient is not on any opioid prescriptions and has no risk factors for a substance use  disorder.   15.   Goals      Maintain or improve exercise capacity (6 minute walk)     Patient is working on balance. Patient will work on increased adherence      Weight (lb) < 200 lb (90.7 kg)         I have personally reviewed and noted the following in the patient's chart:   Medical and social history Use of alcohol, tobacco or illicit drugs  Current medications and supplements Functional ability and status Nutritional status Physical activity Advanced directives List of other physicians Hospitalizations, surgeries, and ER visits in previous 12 months Vitals Screenings to include cognitive, depression, and falls Referrals and appointments  In addition, I have reviewed and discussed with patient certain preventive protocols, quality metrics, and best practice recommendations. A written personalized care plan for preventive services as well as general preventive health recommendations were provided to patient.   Impression and Plan:  Medicare annual wellness visit, subsequent -     PSA; Future  Type 2 diabetes mellitus with diabetic nephropathy, without long-term current use of insulin  (HCC) -     Hemoglobin A1c; Future -     CBC with Differential/Platelet; Future -     Comprehensive metabolic panel with GFR; Future -     Microalbumin / creatinine urine ratio; Future  Essential hypertension -     Vitamin B12; Future -     VITAMIN D  25 Hydroxy (Vit-D Deficiency, Fractures); Future  Dyslipidemia -     Lipid panel; Future  Mild intermittent asthma without complication  Gastroesophageal reflux disease without esophagitis  PSA, INCREASED  Long term current use of insulin  (HCC)  Acquired hypothyroidism -     TSH; Future  Skin cancer screening -     Ambulatory referral to Dermatology   -Recommend routine eye and dental care. -Healthy lifestyle discussed in detail. -Labs to be updated today. -Prostate cancer screening: PSA today Health Maintenance   Topic Date Due   Yearly kidney health urinalysis for diabetes  Never done   Flu Shot  01/08/2024   COVID-19 Vaccine (7 - 2025-26 season) 02/08/2024   Hemoglobin A1C  06/10/2024   Eye exam for diabetics  10/25/2024   Yearly kidney  function blood test for diabetes  11/18/2024   Complete foot exam   02/23/2025   Medicare Annual Wellness Visit  02/23/2025   DTaP/Tdap/Td vaccine (5 - Td or Tdap) 11/12/2031   Pneumococcal Vaccine for age over 64  Completed   Zoster (Shingles) Vaccine  Completed   HPV Vaccine  Aged Out   Meningitis B Vaccine  Aged Out     - PCV 20 in office today.  Will update flu and COVID at the [facility]. - Dermatology referral placed for annual screening.     Tully Theophilus Andrews, MD [facility] at [facility]

## 2024-03-01 ENCOUNTER — Ambulatory Visit

## 2024-03-07 ENCOUNTER — Ambulatory Visit

## 2024-03-07 VITALS — Ht 70.75 in | Wt 203.0 lb

## 2024-03-07 DIAGNOSIS — Z0000 Encounter for general adult medical examination without abnormal findings: Secondary | ICD-10-CM | POA: Diagnosis not present

## 2024-03-07 NOTE — Patient Instructions (Signed)
 [name],  Thank you for taking the time for your Medicare Wellness Visit. I appreciate your continued commitment to your health goals. Please review the care plan we discussed, and feel free to reach out if I can assist you further.  Medicare recommends these wellness visits once per year to help you and your care team stay ahead of potential health issues. These visits are designed to focus on prevention, allowing your provider to concentrate on managing your acute and chronic conditions during your regular appointments.  Please note that Annual Wellness Visits do not include a physical exam. Some assessments may be limited, especially if the visit was conducted virtually. If needed, we may recommend a separate in-person follow-up with your provider.  Ongoing Care Seeing your primary care provider every 3 to 6 months helps us  monitor your health and provide consistent, personalized care. Last office visit on 02/24/2024.  You are due for a kidney evaluation, (urine sample) and can get that done at your next office visit or at another office.  Please have the office to send results to Dr. Theophilus, if you get it done at a different office.    Referrals If a referral was made during today's visit and you haven't received any updates within two weeks, please contact the referred provider directly to check on the status.  Recommended Screenings:  Health Maintenance  Topic Date Due   Yearly kidney health urinalysis for diabetes  Never done   COVID-19 Vaccine (7 - 2025-26 season) 02/08/2024   Hemoglobin A1C  06/10/2024   Eye exam for diabetics  10/25/2024   Yearly kidney function blood test for diabetes  11/18/2024   Complete foot exam   02/23/2025   Medicare Annual Wellness Visit  02/23/2025   DTaP/Tdap/Td vaccine (5 - Td or Tdap) 11/12/2031   Pneumococcal Vaccine for age over 71  Completed   Flu Shot  Completed   Zoster (Shingles) Vaccine  Completed   HPV Vaccine  Aged Out   Meningitis B  Vaccine  Aged Out       03/07/2024    3:27 PM  Advanced Directives  Does Patient Have a Medical Advance Directive? Yes  Type of Advance Directive Living will;Out of facility DNR (pink MOST or yellow form);Healthcare Power of Richard Ho of Healthcare Power of Attorney in Chart? No - copy requested   Advance Care Planning is important because it: Ensures you receive medical care that aligns with your values, goals, and preferences. Provides guidance to your family and loved ones, reducing the emotional burden of decision-making during critical moments.  Vision: Annual vision screenings are recommended for early detection of glaucoma, cataracts, and diabetic retinopathy. These exams can also reveal signs of chronic conditions such as diabetes and high blood pressure.  Dental: Annual dental screenings help detect early signs of oral cancer, gum disease, and other conditions linked to overall health, including heart disease and diabetes.  Please see the attached documents for additional preventive care recommendations.

## 2024-03-07 NOTE — Progress Notes (Signed)
 Subjective:   Richard Ho is a 83 y.o. who presents for a Medicare Wellness preventive visit.  As a reminder, Annual Wellness Visits don't include a physical exam, and some assessments may be limited, especially if this visit is performed virtually. We may recommend an in-person follow-up visit with your provider if needed.  Visit Complete: Virtual I connected with  Richard Ho on 03/07/24 by a audio enabled telemedicine application and verified that I am speaking with the correct person using two identifiers.  Patient Location: Home  Provider Location: Home Office  I discussed the limitations of evaluation and management by telemedicine. The patient expressed understanding and agreed to proceed.  Vital Signs: Because this visit was a virtual/telehealth visit, some criteria may be missing or patient reported. Any vitals not documented were not able to be obtained and vitals that have been documented are patient reported.  VideoDeclined- This patient declined Librarian, academic. Therefore the visit was completed with audio only.  Persons Participating in Visit: Patient.  AWV Questionnaire: No: Patient Medicare AWV questionnaire was not completed prior to this visit.  NO CHANGES ON ALCOHOL SCREEN from 03/07/2024-per pt  Cardiac Risk Factors include: advanced age (>43men, >58 women);hypertension;male gender;dyslipidemia;diabetes mellitus;Other (see comment), Risk factor comments: CKD stage 3     Objective:    Today's Vitals   03/07/24 1520  Weight: 203 lb (92.1 kg)  Height: 5' 10.75 (1.797 m)   Body mass index is 28.51 kg/m.     03/07/2024    3:27 PM 02/24/2024    8:04 AM 02/19/2023    4:54 PM 11/12/2020    4:35 AM 10/10/2019    7:44 AM 12/17/2018    9:21 AM 12/16/2018    7:48 AM  Advanced Directives  Does Patient Have a Medical Advance Directive? Yes Yes Yes No Yes Yes Yes  Type of Advance Directive Living will;Out of facility DNR (pink MOST  or yellow form);Healthcare Power of Attorney Living will;Out of facility DNR (pink MOST or yellow form);Healthcare Power of Attorney Living will;Healthcare Power of Attorney  Living will Living will;Healthcare Power of Attorney Living will  Does patient want to make changes to medical advance directive?  No - Patient declined No - Patient declined  No - Patient declined No - Patient declined  No - Patient declined   Copy of Healthcare Power of Attorney in Chart? No - copy requested No - copy requested    No - copy requested    Would patient like information on creating a medical advance directive?    No - Patient declined        Data saved with a previous flowsheet row definition    Current Medications (verified) Outpatient Encounter Medications as of 03/07/2024  Medication Sig   Albuterol  (PROVENTIL  IN) Inhale into the lungs.   albuterol  (VENTOLIN  HFA) 108 (90 Base) MCG/ACT inhaler Inhale 2 puffs into the lungs every 4 (four) hours as needed for wheezing or shortness of breath.   ALPRAZolam  (XANAX ) 0.25 MG tablet TAKE 1 TABLET BY MOUTH AT BEDTIME AS NEEDED FOR SLEEP   amoxicillin -clavulanate (AUGMENTIN ) 875-125 MG tablet Take 1 tablet by mouth every 12 (twelve) hours.   aspirin  EC 81 MG tablet Take 81 mg by mouth daily.   atorvastatin  (LIPITOR) 40 MG tablet Take 1 tablet (40 mg total) by mouth daily.   azelastine  (ASTELIN ) 0.1 % nasal spray Place 1 spray into both nostrils 2 (two) times daily.   Capsaicin 0.1 % CREA APPLY  SMALL AMOUNT TO AFFECTED AREA DAILY AS NEEDED   Carboxymethylcellulose Sod PF (REFRESH PLUS) 0.5 % SOLN Apply to eye.   carvedilol  (COREG ) 6.25 MG tablet Take 1 tablet (6.25 mg total) by mouth 2 (two) times daily with a meal.   Cholecalciferol  (VITAMIN D3 PO) Take 1 tablet by mouth daily.   diclofenac Sodium (VOLTAREN) 1 % GEL Apply topically.   empagliflozin (JARDIANCE) 25 MG TABS tablet Take 0.5 tablets by mouth every morning. Pt take 12.5 mg once  a day.   Ferrous Sulfate  (IRON PO) every morning.   ferrous sulfate 325 (65 FE) MG EC tablet Take 325 mg by mouth daily.    fluticasone  (FLONASE ) 50 MCG/ACT nasal spray Place 2 sprays into both nostrils daily.   fluticasone  (FLOVENT  HFA) 110 MCG/ACT inhaler During asthma flares do 3 inhalations 3 times a day   fluticasone -salmeterol (WIXELA INHUB) 250-50 MCG/ACT AEPB Inhale 1 puff into the lungs in the morning and at bedtime.   hypromellose (GENTEAL) 0.3 % GEL ophthalmic ointment Apply to eye.   isosorbide  mononitrate (IMDUR ) 30 MG 24 hr tablet TAKE ONE TABLET BY MOUTH TWICE DAILY   levothyroxine  (SYNTHROID ) 100 MCG tablet Take 1 tablet by mouth once daily   Magnesium  400 MG CAPS Take 400 mg by mouth in the morning and at bedtime.   nitroGLYCERIN  (NITROSTAT ) 0.4 MG SL tablet Place 1 tablet (0.4 mg total) under the tongue every 5 (five) minutes x 3 doses as needed for chest pain.   olopatadine  (PATANOL) 0.1 % ophthalmic solution Place 2 drops into both eyes 2 (two) times daily.   ondansetron  (ZOFRAN ) 8 MG tablet Take 8 mg by mouth every 8 (eight) hours as needed for nausea or vomiting.    OVER THE COUNTER MEDICATION ALA   pantoprazole  (PROTONIX ) 40 MG tablet Take 1 tablet (40 mg total) by mouth 2 (two) times daily.   POLYETHYLENE GLYCOL 3350 PO Take by mouth.   predniSONE  (DELTASONE ) 50 MG tablet Take 1 tablet (50 mg total) by mouth daily with breakfast.   pregabalin  (LYRICA ) 75 MG capsule Take 1 capsule by mouth twice daily   tamsulosin  (FLOMAX ) 0.4 MG CAPS capsule TAKE ONE CAPSULE BY MOUTH DAILY   Tiotropium Bromide Monohydrate  (SPIRIVA  RESPIMAT) 1.25 MCG/ACT AERS Inhale 2 Inhalations into the lungs every morning.   traZODone  (DESYREL ) 50 MG tablet Take by mouth.   Spacer/Aero-Holding Chambers DEVI 1 Device by Does not apply route as directed.   No facility-administered encounter medications on file as of 03/07/2024.    Allergies (verified) Meloxicam   History: Past Medical History:  Diagnosis Date   Anemia     Asthma    Cataract    CKD (chronic kidney disease), stage III (HCC)    Coronary artery disease 1991   a) Angioplasty LAD 1991 [place] b) MI in 1995 at  [facility] in [place] , med Rx c) Cath 10/2019 for NSTEMI - see report -> med rx.   Diabetes mellitus    GERD (gastroesophageal reflux disease)    Hearing loss    HTN (hypertension)    Hyperlipidemia    Hypothyroidism    Myocardial infarct (HCC)    Nephrolithiasis    Osteopenia    Premature atrial contractions    PVC's (premature ventricular contractions)    Snake bite    Past Surgical History:  Procedure Laterality Date   ANGIOPLASTY  1991   CARDIAC CATHETERIZATION  2005   Moderate LCx and LAD disease and severe diffuse RCA disease  CARDIAC CATHETERIZATION  01/2012   normal left main, 50% pLAD stenosis, dLAD mild luminal irregularities, D1 30-40% stenosis at take off; subtotally occluded/severely diseased OM1, mid 80% stenosis in small OM2, long prox and mid RCA stenoses ranging from 60-95%, severely diseased PDA, distal PLSA occluded filling via L-R collateral; normal LV function; inferior lateral basal akinesis.   CHOLECYSTECTOMY  2012   CORONARY PRESSURE/FFR STUDY N/A 10/10/2019   Procedure: INTRAVASCULAR PRESSURE WIRE/FFR STUDY;  Surgeon: Court Dorn PARAS, MD;  Location: [facility];  Service: Cardiovascular;  Laterality: N/A;   EYE SURGERY  2010   cataract - right, has left done too   INGUINAL HERNIA REPAIR     LEFT HEART CATH AND CORONARY ANGIOGRAPHY N/A 10/10/2019   Procedure: LEFT HEART CATH AND CORONARY ANGIOGRAPHY;  Surgeon: Court Dorn PARAS, MD;  Location: [facility];  Service: Cardiovascular;  Laterality: N/A;   LEFT HEART CATHETERIZATION WITH CORONARY ANGIOGRAM N/A 01/28/2012   Procedure: LEFT HEART CATHETERIZATION WITH CORONARY ANGIOGRAM;  Surgeon: Deatrice DELENA Cage, MD;  Location: [facility] CATH [facility];  Service: Cardiovascular;  Laterality: N/A;   LEFT HEART CATHETERIZATION WITH CORONARY ANGIOGRAM N/A  03/17/2014   Procedure: LEFT HEART CATHETERIZATION WITH CORONARY ANGIOGRAM;  Surgeon: Lonni JONETTA Cash, MD;  Location: [facility] CATH LAB;  Service: Cardiovascular;  Laterality: N/A;   ORIF TIBIA & FIBULA FRACTURES     PTCA     SHOULDER ARTHROSCOPY WITH DISTAL CLAVICLE RESECTION Left 12/16/2018   Procedure: LEFT SHOULDER ARTHROSCOPY WITH DISTAL CLAVICLE RESECTION;  Surgeon: Cristy Bonner DASEN, MD;  Location: [facility];  Service: Orthopedics;  Laterality: Left;   SHOULDER ARTHROSCOPY WITH ROTATOR CUFF REPAIR AND SUBACROMIAL DECOMPRESSION Left 12/16/2018   Procedure: LEFT SHOULDER ARTHROSCOPY WITH ROTATOR CUFF REPAIR AND SUBACROMIAL DECOMPRESSION, DEBRIDEMENT;  Surgeon: Cristy Bonner DASEN, MD;  Location: [facility];  Service: Orthopedics;  Laterality: Left;   SHOULDER ARTHROSCOPY WITH SUBACROMIAL DECOMPRESSION AND BICEP TENDON REPAIR Left 12/16/2018   Procedure: SHOULDER ARTHROSCOPY WITH SUBACROMIAL DECOMPRESSION AND BICEP TENDON REPAIR;  Surgeon: Cristy Bonner DASEN, MD;  Location: [facility];  Service: Orthopedics;  Laterality: Left;   VASECTOMY     Family History  Problem Relation Age of Onset   Asthma Mother    Allergies Sister    Coronary artery disease Other    Colon cancer Neg Hx    Esophageal cancer Neg Hx    Stomach cancer Neg Hx    Rectal cancer Neg Hx    Social History   Socioeconomic History   Marital status: Married    Spouse name: Minerva   Number of children: Not on file   Years of education: Not on file   Highest education level: Bachelor's degree (e.g., BA, AB, BS)  Occupational History   Occupation: Retired  Tobacco Use   Smoking status: Never   Smokeless tobacco: Never  Vaping Use   Vaping status: Never Used  Substance and Sexual Activity   Alcohol use: Yes    Alcohol/week: 0.0 standard drinks of alcohol    Comment: occ, once every 2 months   Drug use: No   Sexual activity: Not on file  Other Topics Concern   Not on file  Social  History Narrative   Lives with wife and 1 son/2025   Social Drivers of Health   Financial Resource Strain: Low Risk  (03/07/2024)   Overall Financial Resource Strain (CARDIA)    Difficulty of Paying Living Expenses: Not hard at all  Food  Insecurity: No Food Insecurity (03/07/2024)   Hunger Vital Sign    Worried About Running Out of Food in the Last Year: Never true    Ran Out of Food in the Last Year: Never true  Transportation Needs: No Transportation Needs (03/07/2024)   PRAPARE - Administrator, Civil Service (Medical): No    Lack of Transportation (Non-Medical): No  Physical Activity: Insufficiently Active (03/07/2024)   Exercise Vital Sign    Days of Exercise per Week: 3 days    Minutes of Exercise per Session: 20 min  Stress: No Stress Concern Present (03/07/2024)   [employer] of Occupational Health - Occupational Stress Questionnaire    Feeling of Stress: Only a little  Social Connections: Moderately Integrated (03/07/2024)   Social Connection and Isolation Panel    Frequency of Communication with Friends and Family: Three times a week    Frequency of Social Gatherings with Friends and Family: Once a week    Attends Religious Services: Patient declined    Database administrator or Organizations: Yes    Attends Engineer, structural: More than 4 times per year    Marital Status: Married    Tobacco Counseling Counseling given: Not Answered    Clinical Intake:  Pre-visit preparation completed: Yes  Pain : No/denies pain     BMI - recorded: 28.51 Nutritional Status: BMI 25 -29 Overweight Nutritional Risks: None Diabetes: Yes CBG done?: No CBG resulted in Enter/ Edit results?: No Did pt. bring in CBG monitor from home?: No  Lab Results  Component Value Date   HGBA1C 6.8 (A) 12/09/2023   HGBA1C 7.1 (H) 02/23/2023   HGBA1C 6.7 (H) 02/18/2022     How often do you need to have someone help you when you read instructions, pamphlets, or  other written materials from your doctor or pharmacy?: 1 - Never  Interpreter Needed?: No  Information entered by :: Jocsan Mcginley, RMA   Activities of Daily Living     03/07/2024    3:25 PM 02/24/2024    8:01 AM  In your present state of health, do you have any difficulty performing the following activities:  Hearing? 1 1  Comment Wears hearing aides   Vision? 1 1  Difficulty concentrating or making decisions? 0 0  Walking or climbing stairs? 0 0  Dressing or bathing? 0 0  Doing errands, shopping? 0 0  Preparing Food and eating ? N N  Using the Toilet? N N  In the past six months, have you accidently leaked urine? N N  Do you have problems with loss of bowel control? N N  Managing your Medications? N N  Managing your Finances? N N  Housekeeping or managing your Housekeeping? N N    Patient Care Team: Theophilus Andrews, Tully GRADE, MD as PCP - General (Internal Medicine) Verlin Lonni BIRCH, MD as PCP - Cardiology (Cardiology) Mealor, Eulas BRAVO, MD as PCP - Electrophysiology (Cardiology)  I have updated your Care Teams any recent Medical Services you may have received from other providers in the past year.     Assessment:   This is a routine wellness examination for Richard Ho.  Hearing/Vision screen Hearing Screening - Comments:: Wears hearing aides Vision Screening - Comments[facility]    Goals Addressed             This Visit's Progress    Maintain or improve exercise capacity (6 minute walk)   On track  Patient is working on balance. Patient will work on increased adherence        Depression Screen     03/07/2024    3:29 PM 02/24/2024    8:04 AM 12/09/2023    9:04 AM 02/23/2023    7:12 AM 02/19/2023    4:54 PM 06/18/2022    3:22 PM 02/18/2022   11:06 AM  PHQ 2/9 Scores  PHQ - 2 Score 0 0 0 0 0 0 0  PHQ- 9 Score 0  3 1  2 3     Fall Risk     03/07/2024    3:27 PM 02/24/2024    8:04 AM 12/09/2023    9:03 AM  02/19/2023    4:54 PM 06/18/2022    3:22 PM  Fall Risk   Falls in the past year? 0 0 0 1 1  Number falls in past yr:  0 0 1 0  Injury with Fall?  0 0 0 0  Risk for fall due to :     No Fall Risks  Follow up Falls evaluation completed;Falls prevention discussed Falls evaluation completed Falls evaluation completed Falls evaluation completed Falls evaluation completed      Data saved with a previous flowsheet row definition    MEDICARE RISK AT HOME:  Medicare Risk at Home Any stairs in or around the home?: Yes If so, are there any without handrails?: No Home free of loose throw rugs in walkways, pet beds, electrical cords, etc?: No Adequate lighting in your home to reduce risk of falls?: No Life alert?: Yes Use of a cane, walker or w/c?: Yes Grab bars in the bathroom?: No Shower chair or bench in shower?: No Elevated toilet seat or a handicapped toilet?: No  TIMED UP AND GO:  Was the test performed?  No  Cognitive Function: Declined/Normal: No cognitive concerns noted by patient or family. Patient alert, oriented, able to answer questions appropriately and recall recent events. No signs of memory loss or confusion.        02/24/2024    8:06 AM 02/19/2023    4:55 PM  6CIT Screen  What Year? 0 points 0 points  What month? 0 points 0 points  What time? 0 points 0 points  Count back from 20 0 points 0 points  Months in reverse 0 points 0 points  Repeat phrase 0 points 0 points  Total Score 0 points 0 points    Immunizations Immunization History  Administered Date(s) Administered    sv, Bivalent, Protein Subunit Rsvpref,pf (Abrysvo) 03/31/2022   Fluad Quad(high Dose 65+) 02/16/2019, 03/03/2020, 03/06/2021   Fluad Trivalent(High Dose 65+) 02/23/2023   INFLUENZA, HIGH DOSE SEASONAL PF 02/25/2010, 03/09/2013, 03/24/2013, 02/07/2014, 02/08/2015, 03/05/2015, 02/08/2016, 02/21/2016, 03/23/2017, 02/17/2022   Influenza Split 03/14/2011   Influenza Whole 04/08/2007, 03/15/2008    Influenza,inj,Quad PF,6+ Mos 02/21/2013, 02/21/2014   Influenza-Unspecified 03/09/2005, 03/09/2006, 03/10/2007, 04/09/2008, 02/07/2010, 03/10/2011, 02/23/2012, 03/18/2012, 02/07/2018, 02/25/2018, 02/22/2019   Moderna Covid-19 Fall Seasonal Vaccine 12yrs & older 04/08/2022   Moderna Sars-Covid-2 Vaccination 07/02/2019, 08/03/2019, 04/23/2020, 10/31/2020   PNEUMOCOCCAL CONJUGATE-20 02/24/2024   Pfizer Covid-19 Vaccine Bivalent Booster 7yrs & up 03/07/2021   Pneumococcal Conjugate-13 03/09/2013, 05/30/2013   Pneumococcal Polysaccharide-23 04/22/2006, 04/26/2007   Pneumococcal-Unspecified 04/22/2006   Tdap 05/27/2012, 11/20/2015, 10/11/2021, 11/11/2021   Zoster Recombinant(Shingrix) 01/26/2020, 04/06/2020   Zoster, Live 04/12/2014    Screening Tests Health Maintenance  Topic Date Due   Diabetic kidney evaluation - Urine ACR  Never done   COVID-19 Vaccine (7 -  2025-26 season) 02/08/2024   HEMOGLOBIN A1C  06/10/2024   OPHTHALMOLOGY EXAM  10/25/2024   Diabetic kidney evaluation - eGFR measurement  11/18/2024   FOOT EXAM  02/23/2025   Medicare Annual Wellness (AWV)  02/23/2025   DTaP/Tdap/Td (5 - Td or Tdap) 11/12/2031   Pneumococcal Vaccine: 50+ Years  Completed   Influenza Vaccine  Completed   Zoster Vaccines- Shingrix  Completed   HPV VACCINES  Aged Out   Meningococcal B Vaccine  Aged Out    Health Maintenance Items Addressed: See Nurse Notes at the end of this note  Additional Screening:  Vision Screening: Recommended annual ophthalmology exams for early detection of glaucoma and other disorders of the eye. Is the patient up to date with their annual eye exam?  No  Who is the provider or what is the name of the office in which the patient attends annual eye exams? [facility]   Dental Screening: Recommended annual dental exams for proper oral hygiene  Community Resource Referral / Chronic Care Management: CRR required this visit?  No   CCM required this visit?   No   Plan:    I have personally reviewed and noted the following in the patient's chart:   Medical and social history Use of alcohol, tobacco or illicit drugs  Current medications and supplements including opioid prescriptions. Patient is not currently taking opioid prescriptions. Functional ability and status Nutritional status Physical activity Advanced directives List of other physicians Hospitalizations, surgeries, and ER visits in previous 12 months Vitals Screenings to include cognitive, depression, and falls Referrals and appointments  In addition, I have reviewed and discussed with patient certain preventive protocols, quality metrics, and best practice recommendations. A written personalized care plan for preventive services as well as general preventive health recommendations were provided to patient.   Merit Maybee L Priscella Donna, CMA   03/07/2024   After Visit Summary: (MyChart) Due to this being a telephonic visit, the after visit summary with patients personalized plan was offered to patient via MyChart   Notes: Patient is due for UACR, however, he stated that he may get that done at the [facility] hospital and have them send it to Dr. Theophilus office.  He had no other concerns to address today.

## 2024-03-08 DIAGNOSIS — E1122 Type 2 diabetes mellitus with diabetic chronic kidney disease: Secondary | ICD-10-CM | POA: Diagnosis not present

## 2024-03-08 DIAGNOSIS — N1831 Chronic kidney disease, stage 3a: Secondary | ICD-10-CM | POA: Diagnosis not present

## 2024-03-08 DIAGNOSIS — D631 Anemia in chronic kidney disease: Secondary | ICD-10-CM | POA: Diagnosis not present

## 2024-03-08 DIAGNOSIS — I129 Hypertensive chronic kidney disease with stage 1 through stage 4 chronic kidney disease, or unspecified chronic kidney disease: Secondary | ICD-10-CM | POA: Diagnosis not present

## 2024-05-30 ENCOUNTER — Ambulatory Visit: Admitting: Dermatology
